# Patient Record
Sex: Female | Born: 1935 | Race: Black or African American | Hispanic: No | Smoking: Former smoker
Health system: Southern US, Community
[De-identification: ages and names within clinical notes are randomized; demographics above are authoritative.]

## PROBLEM LIST (undated history)

## (undated) DIAGNOSIS — K909 Intestinal malabsorption, unspecified: Secondary | ICD-10-CM

## (undated) DIAGNOSIS — N200 Calculus of kidney: Secondary | ICD-10-CM

## (undated) DIAGNOSIS — J189 Pneumonia, unspecified organism: Secondary | ICD-10-CM

## (undated) DIAGNOSIS — K219 Gastro-esophageal reflux disease without esophagitis: Secondary | ICD-10-CM

## (undated) DIAGNOSIS — I2510 Atherosclerotic heart disease of native coronary artery without angina pectoris: Secondary | ICD-10-CM

## (undated) DIAGNOSIS — M1990 Unspecified osteoarthritis, unspecified site: Secondary | ICD-10-CM

## (undated) DIAGNOSIS — E739 Lactose intolerance, unspecified: Secondary | ICD-10-CM

## (undated) DIAGNOSIS — D689 Coagulation defect, unspecified: Secondary | ICD-10-CM

## (undated) DIAGNOSIS — M4800 Spinal stenosis, site unspecified: Secondary | ICD-10-CM

## (undated) DIAGNOSIS — D219 Benign neoplasm of connective and other soft tissue, unspecified: Secondary | ICD-10-CM

## (undated) DIAGNOSIS — D649 Anemia, unspecified: Secondary | ICD-10-CM

## (undated) DIAGNOSIS — D500 Iron deficiency anemia secondary to blood loss (chronic): Secondary | ICD-10-CM

## (undated) DIAGNOSIS — J449 Chronic obstructive pulmonary disease, unspecified: Secondary | ICD-10-CM

## (undated) DIAGNOSIS — I82409 Acute embolism and thrombosis of unspecified deep veins of unspecified lower extremity: Secondary | ICD-10-CM

## (undated) DIAGNOSIS — I10 Essential (primary) hypertension: Secondary | ICD-10-CM

## (undated) DIAGNOSIS — D329 Benign neoplasm of meninges, unspecified: Secondary | ICD-10-CM

## (undated) HISTORY — DX: Acute embolism and thrombosis of unspecified deep veins of unspecified lower extremity: I82.409

## (undated) HISTORY — DX: Benign neoplasm of connective and other soft tissue, unspecified: D21.9

## (undated) HISTORY — DX: Lactose intolerance, unspecified: E73.9

## (undated) HISTORY — DX: Unspecified osteoarthritis, unspecified site: M19.90

## (undated) HISTORY — DX: Atherosclerotic heart disease of native coronary artery without angina pectoris: I25.10

## (undated) HISTORY — DX: Gastro-esophageal reflux disease without esophagitis: K21.9

## (undated) HISTORY — DX: Calculus of kidney: N20.0

## (undated) HISTORY — DX: Intestinal malabsorption, unspecified: K90.9

## (undated) HISTORY — PX: TREATMENT FISTULA ANAL: SUR1390

## (undated) HISTORY — DX: Coagulation defect, unspecified: D68.9

## (undated) HISTORY — DX: Iron deficiency anemia secondary to blood loss (chronic): D50.0

## (undated) HISTORY — DX: Benign neoplasm of meninges, unspecified: D32.9

## (undated) HISTORY — PX: TONSILLECTOMY AND ADENOIDECTOMY: SUR1326

## (undated) HISTORY — PX: INCISION AND DRAINAGE FOOT: SHX1800

## (undated) HISTORY — DX: Anemia, unspecified: D64.9

## (undated) HISTORY — PX: CHOLECYSTECTOMY: SHX55

## (undated) HISTORY — PX: TUBAL LIGATION: SHX77

## (undated) HISTORY — PX: FOOT SURGERY: SHX648

---

## 1999-05-13 ENCOUNTER — Other Ambulatory Visit: Admission: RE | Admit: 1999-05-13 | Discharge: 1999-05-13 | Payer: Self-pay

## 2000-06-29 ENCOUNTER — Other Ambulatory Visit: Admission: RE | Admit: 2000-06-29 | Discharge: 2000-06-29 | Payer: Self-pay | Admitting: Internal Medicine

## 2000-07-03 ENCOUNTER — Encounter: Payer: Self-pay | Admitting: Internal Medicine

## 2000-07-03 ENCOUNTER — Ambulatory Visit (HOSPITAL_COMMUNITY): Admission: RE | Admit: 2000-07-03 | Discharge: 2000-07-03 | Payer: Self-pay | Admitting: Internal Medicine

## 2000-11-27 ENCOUNTER — Other Ambulatory Visit: Admission: RE | Admit: 2000-11-27 | Discharge: 2000-11-27 | Payer: Self-pay | Admitting: General Surgery

## 2010-06-11 DIAGNOSIS — I679 Cerebrovascular disease, unspecified: Secondary | ICD-10-CM | POA: Insufficient documentation

## 2013-10-03 ENCOUNTER — Emergency Department (HOSPITAL_BASED_OUTPATIENT_CLINIC_OR_DEPARTMENT_OTHER): Payer: Medicare Other

## 2013-10-03 ENCOUNTER — Emergency Department (HOSPITAL_BASED_OUTPATIENT_CLINIC_OR_DEPARTMENT_OTHER)
Admission: EM | Admit: 2013-10-03 | Discharge: 2013-10-03 | Disposition: A | Discharge status: Home / Self Care | Payer: Medicare Other | Attending: Emergency Medicine | Admitting: Emergency Medicine

## 2013-10-03 ENCOUNTER — Encounter (HOSPITAL_BASED_OUTPATIENT_CLINIC_OR_DEPARTMENT_OTHER): Payer: Self-pay | Admitting: Emergency Medicine

## 2013-10-03 DIAGNOSIS — Z8701 Personal history of pneumonia (recurrent): Secondary | ICD-10-CM | POA: Insufficient documentation

## 2013-10-03 DIAGNOSIS — IMO0002 Reserved for concepts with insufficient information to code with codable children: Secondary | ICD-10-CM | POA: Insufficient documentation

## 2013-10-03 DIAGNOSIS — I10 Essential (primary) hypertension: Secondary | ICD-10-CM | POA: Insufficient documentation

## 2013-10-03 DIAGNOSIS — R079 Chest pain, unspecified: Secondary | ICD-10-CM | POA: Insufficient documentation

## 2013-10-03 DIAGNOSIS — J441 Chronic obstructive pulmonary disease with (acute) exacerbation: Secondary | ICD-10-CM | POA: Insufficient documentation

## 2013-10-03 DIAGNOSIS — Z79899 Other long term (current) drug therapy: Secondary | ICD-10-CM | POA: Insufficient documentation

## 2013-10-03 HISTORY — DX: Chronic obstructive pulmonary disease, unspecified: J44.9

## 2013-10-03 HISTORY — DX: Essential (primary) hypertension: I10

## 2013-10-03 HISTORY — DX: Pneumonia, unspecified organism: J18.9

## 2013-10-03 LAB — CBC WITH DIFFERENTIAL/PLATELET
Basophils Absolute: 0 10*3/uL (ref 0.0–0.1)
Basophils Relative: 0 % (ref 0–1)
EOS ABS: 0 10*3/uL (ref 0.0–0.7)
EOS PCT: 0 % (ref 0–5)
HCT: 38.5 % (ref 36.0–46.0)
HEMOGLOBIN: 12.2 g/dL (ref 12.0–15.0)
LYMPHS ABS: 2.4 10*3/uL (ref 0.7–4.0)
Lymphocytes Relative: 37 % (ref 12–46)
MCH: 22.9 pg — AB (ref 26.0–34.0)
MCHC: 31.7 g/dL (ref 30.0–36.0)
MCV: 72.2 fL — AB (ref 78.0–100.0)
MONO ABS: 0.5 10*3/uL (ref 0.1–1.0)
MONOS PCT: 8 % (ref 3–12)
NEUTROS PCT: 55 % (ref 43–77)
Neutro Abs: 3.5 10*3/uL (ref 1.7–7.7)
Platelets: 396 10*3/uL (ref 150–400)
RBC: 5.33 MIL/uL — ABNORMAL HIGH (ref 3.87–5.11)
RDW: 17.4 % — ABNORMAL HIGH (ref 11.5–15.5)
WBC: 6.5 10*3/uL (ref 4.0–10.5)

## 2013-10-03 LAB — BASIC METABOLIC PANEL
BUN: 14 mg/dL (ref 6–23)
CO2: 26 mEq/L (ref 19–32)
CREATININE: 0.6 mg/dL (ref 0.50–1.10)
Calcium: 9.8 mg/dL (ref 8.4–10.5)
Chloride: 102 mEq/L (ref 96–112)
GFR calc Af Amer: >90 mL/min (ref >90)
GFR calc non Af Amer: 87 mL/min — ABNORMAL LOW (ref >90)
GLUCOSE: 169 mg/dL — AB (ref 70–99)
POTASSIUM: 3.4 meq/L — AB (ref 3.7–5.3)
Sodium: 143 mEq/L (ref 137–147)

## 2013-10-03 LAB — PRO B NATRIURETIC PEPTIDE: Pro B Natriuretic peptide (BNP): 48.7 pg/mL (ref 0–450)

## 2013-10-03 LAB — TROPONIN I: Troponin I: <0.3 ng/mL (ref <0.30)

## 2013-10-03 MED ORDER — IPRATROPIUM-ALBUTEROL 0.5-2.5 (3) MG/3ML IN SOLN
3.0000 mL | Freq: Once | RESPIRATORY_TRACT | Status: AC
  Administered 2013-10-03: 3 mL via RESPIRATORY_TRACT
  Filled 2013-10-03: qty 3

## 2013-10-03 MED ORDER — PREDNISONE 50 MG PO TABS
60.0000 mg | ORAL_TABLET | Freq: Once | ORAL | Status: AC
  Administered 2013-10-03: 22:00:00 60 mg via ORAL
  Filled 2013-10-03 (×2): qty 1

## 2013-10-03 MED ORDER — PREDNISONE 20 MG PO TABS: ORAL_TABLET | ORAL | Status: DC

## 2013-10-03 NOTE — Discharge Instructions (Signed)

## 2013-10-03 NOTE — ED Notes (Signed)
Sob x 2 days. She was recently discharged from [facility] after being admitted for pneumonia. She is talking in complete sentences. Ambulatory to triage from registration.

## 2013-10-03 NOTE — ED Provider Notes (Signed)
CSN: [id number]     Arrival date & time 10/03/13  1738 History  This chart was scribed for Neta Ehlers, MD by Elby Beck, ED Scribe. This patient was seen in room MH02/MH02 and the patient's care was started at 7:36 PM.   Chief Complaint  Patient presents with  . Shortness of Breath    Patient is a 78 y.o. female presenting with shortness of breath. The history is provided by the patient. No language interpreter was used.  Shortness of Breath Severity:  Moderate Onset quality:  Gradual Duration:  3 days Timing:  Intermittent Progression:  Worsening Context comment:  Recent pneumonia Relieved by:  Inhaler (some relief with at home inhalers) Worsened by:  Activity Ineffective treatments:  None tried Associated symptoms: chest pain   Associated symptoms: no abdominal pain, no cough, no diaphoresis, no fever, no headaches, no neck pain, no sore throat and no vomiting     HPI Comments: Holly Page is a 78 y.o. Female with a history of COPD who presents to the Emergency Department complaining of intermittent SOB over the past 3 days. She states that she was admitted in the hospital on 08/26/13 after being diagnosed with pneumonia, and that she was kept there until 09/03/13. She states that she made a full recovery, but that her SOB returned 2 days ago. She states that finished courses of Zithromax and steroids after being discharged. She states that she had intermittent nausea yesterday and 2 days ago which has subsided. She also states that she has been having Intermittent "aching, burning" chest pain for years, including over the past few days. She states that she has never had a satisfactory diagnosis for this pain, and that she has been told that she is likely just having heartburn. She states that she has been using ProAir, QVAR and albuterol inhalers at home with some relief of her SOB. She also states that she is on supplemental oxygen at home, at nights, when needed. She denies  any known sick contacts. She states that she has not had this season's influenza vaccine. She is a former smoker and states that she quit smoking about 1 year and 3 months ago.   Past Medical History  Diagnosis Date  . Pneumonia   . COPD (chronic obstructive pulmonary disease)   . Hypertension    Past Surgical History  Procedure Laterality Date  . Foot surgery    . Cholecystectomy    . Tonsillectomy     No family history on file. History  Substance Use Topics  . Smoking status: Never Smoker   . Smokeless tobacco: Not on file  . Alcohol Use: No   OB History   Grav Para Term Preterm Abortions TAB SAB Ect Mult Living                 Review of Systems  Constitutional: Negative for fever, chills, diaphoresis, activity change, appetite change and fatigue.  HENT: Negative for congestion, facial swelling, rhinorrhea and sore throat.   Eyes: Negative for photophobia and discharge.  Respiratory: Positive for shortness of breath. Negative for cough and chest tightness.   Cardiovascular: Positive for chest pain. Negative for palpitations and leg swelling.  Gastrointestinal: Positive for nausea. Negative for vomiting, abdominal pain and diarrhea.  Endocrine: Negative for polydipsia and polyuria.  Genitourinary: Negative for dysuria, frequency, difficulty urinating and pelvic pain.  Musculoskeletal: Negative for arthralgias, back pain, neck pain and neck stiffness.  Skin: Negative for color change and wound.  Allergic/Immunologic: Negative for immunocompromised state.  Neurological: Negative for facial asymmetry, weakness, numbness and headaches.  Hematological: Does not bruise/bleed easily.  Psychiatric/Behavioral: Negative for confusion and agitation.    Allergies  Aspirin  Home Medications   Current Outpatient Rx  Name  Route  Sig  Dispense  Refill  . albuterol (PROVENTIL HFA;VENTOLIN HFA) 108 (90 BASE) MCG/ACT inhaler   Inhalation   Inhale into the lungs every 6 (six) hours  as needed for wheezing or shortness of breath.         . AMLODIPINE BESYLATE PO   Oral   Take by mouth.         . Beclomethasone Dipropionate (QVAR IN)   Inhalation   Inhale into the lungs.         . fluticasone (FLONASE) 50 MCG/ACT nasal spray   Each Nare   Place into both nostrils daily.         . Montelukast Sodium (SINGULAIR PO)   Oral   Take by mouth.         . VERAPAMIL HCL ER, CO, PO   Oral   Take by mouth.         . predniSONE (DELTASONE) 20 MG tablet      2 po daily x 4 days   8 tablet   0    Triage Vitals: BP 114/67  Pulse 81  Temp(Src) 98.2 F (36.8 C) (Oral)  Resp 20  Ht 5\' 5"  (1.651 m)  Wt 114 lb (51.71 kg)  BMI 18.97 kg/m2  SpO2 96%  Physical Exam  Nursing note and vitals reviewed. Constitutional: She is oriented to person, place, and time. She appears well-developed and well-nourished. No distress.  HENT:  Head: Normocephalic.  Mouth/Throat: Oropharynx is clear and moist.  Eyes: Pupils are equal, round, and reactive to light.  Neck: Neck supple.  Cardiovascular: Normal rate, regular rhythm and normal heart sounds.   Pulmonary/Chest: Effort normal and breath sounds normal. No respiratory distress. She has no wheezes.  Abdominal: Soft. She exhibits no distension. There is no tenderness. There is no rebound and no guarding.  Musculoskeletal: She exhibits no edema and no tenderness.  Neurological: She is alert and oriented to person, place, and time.  Skin: Skin is warm and dry.  Psychiatric: She has a normal mood and affect.    ED Course  Procedures (including critical care time)  DIAGNOSTIC STUDIES: Oxygen Saturation is 96% on RA, normal by my interpretation.    COORDINATION OF CARE: 7:46 PM- CXR and EKG have been obtained. Pt advised of plan for treatment and pt agrees.  Medications  predniSONE (DELTASONE) tablet 60 mg (not administered)  ipratropium-albuterol (DUONEB) 0.5-2.5 (3) MG/3ML nebulizer solution 3 mL (3 mLs  Nebulization Given 10/03/13 2009)   Labs Review Labs Reviewed  CBC WITH DIFFERENTIAL - Abnormal; Notable for the following:    RBC 5.33 (*)    MCV 72.2 (*)    MCH 22.9 (*)    RDW 17.4 (*)    All other components within normal limits  BASIC METABOLIC PANEL - Abnormal; Notable for the following:    Potassium 3.4 (*)    Glucose, Bld 169 (*)    GFR calc non Af Amer 87 (*)    All other components within normal limits  PRO B NATRIURETIC PEPTIDE  TROPONIN I   Imaging Review Dg Chest 2 View  10/03/2013   CLINICAL DATA:  Shortness of breath for 2 days, upper chest pressure, region pneumonia, history hypertension,  COPD  EXAM: CHEST  2 VIEW  COMPARISON:  09/26/2013  FINDINGS: Normal heart size, mediastinal contours, and pulmonary vascularity.  Atherosclerotic calcification aortic arch.  Severe emphysematous changes consistent with COPD.  No definite pulmonary infiltrate, pleural effusion or pneumothorax.  Mild thoracolumbar scoliosis.  IMPRESSION: Severe COPD changes.  No acute abnormalities.   Electronically Signed   By: Lavonia Dana M.D.   On: 10/03/2013 18:31    EKG Interpretation    Date/Time:  Monday October 03 2013 18:02:10 EST Ventricular Rate:  66 PR Interval:  168 QRS Duration: 70 QT Interval:  390 QTC Calculation: 408 R Axis:   84 Text Interpretation:  Normal sinus rhythm EKG WITHIN NORMAL LIMITS No prior for comparison.  Confirmed by DOCHERTY  MD, [place] [phone]) on 10/03/2013 8:02:01 PM            MDM   1. COPD exacerbation    Pt is a 78 y.o. female with Pmhx as above who presents with about 2 days of inc dyspnea, worse w/ exertion. No cough, fever, chills, n/v, d/a, ab pain, leg pain/swelling. Additionally, she states she has been having intermittent L sided/central CP for about 2 years, intermittent, w/o assoc symptoms.  She has had an episode a day for last 3 days which are similar to prior.  She reports no cause has been found despite w/u. On PE, VSS, pt in NAD. She has dec  breath sounds throughout w/ inc expiratory time.     CP is atypical, doubt ACS given trop, EKG, nml BNP & Trop. CXR shows no opacity or CHF.  Suspect symptoms due to COPD exacerbation.  Pt feeling improved after duoneb x1.  I feel she is safe for PCP f/u tomorrow at 2:30.  Will start on 5d steroid burst.  Return precautions given for new or worsening symptoms including worsening SOB, CP, fever, leg pain/swelling.       I personally performed the services described in this documentation, which was scribed in my presence. The recorded information has been reviewed and is accurate.  Neta Ehlers, MD 10/03/13 2121

## 2013-10-11 ENCOUNTER — Encounter: Payer: Self-pay | Admitting: Critical Care Medicine

## 2013-10-11 ENCOUNTER — Ambulatory Visit (INDEPENDENT_AMBULATORY_CARE_PROVIDER_SITE_OTHER): Payer: Medicare Other | Admitting: Critical Care Medicine

## 2013-10-11 VITALS — BP 110/68 | HR 82 | Temp 98.2°F | Ht 65.0 in | Wt 112.5 lb

## 2013-10-11 DIAGNOSIS — J449 Chronic obstructive pulmonary disease, unspecified: Secondary | ICD-10-CM

## 2013-10-11 DIAGNOSIS — E739 Lactose intolerance, unspecified: Secondary | ICD-10-CM | POA: Insufficient documentation

## 2013-10-11 DIAGNOSIS — K219 Gastro-esophageal reflux disease without esophagitis: Secondary | ICD-10-CM | POA: Insufficient documentation

## 2013-10-11 DIAGNOSIS — J439 Emphysema, unspecified: Secondary | ICD-10-CM | POA: Insufficient documentation

## 2013-10-11 DIAGNOSIS — J438 Other emphysema: Secondary | ICD-10-CM

## 2013-10-11 MED ORDER — ACLIDINIUM BROMIDE 400 MCG/ACT IN AEPB: 1.0000 | INHALATION_SPRAY | Freq: Two times a day (BID) | RESPIRATORY_TRACT | Status: DC

## 2013-10-11 NOTE — Patient Instructions (Signed)
Stop Qvar Stop accolate Start Tudorza one puff twice daily Use proair as needed  A referral to pulmonary rehab will be made Return 2 months

## 2013-10-11 NOTE — Progress Notes (Signed)
Subjective:    Patient ID: Holly Page, female    DOB: [birth year], 78 y.o.   MRN: [id number]  HPI Comments: Dx Copd since 1990s.  Pt was stable, then in early 2000s more dyspnea noted. Now over past two years is worse.  Oxygen ok but still breathless.  No real cough or mucus production. Mucus usually clear  Shortness of Breath This is a chronic problem. The current episode started more than 1 year ago. The problem occurs daily (pt is dyspneic at rest and exertion. ). The problem has been gradually worsening. Associated symptoms include orthopnea, rhinorrhea and wheezing. Pertinent negatives include no abdominal pain, chest pain, claudication, coryza, ear pain, fever, headaches, hemoptysis, leg pain, leg swelling, neck pain, PND, rash, sore throat, sputum production, swollen glands, syncope or vomiting. The symptoms are aggravated by fumes, odors, weather changes and any activity. Risk factors include smoking. She has tried steroid inhalers and beta agonist inhalers (on qvar and proair. proair helps, ? if qvar helps) for the symptoms. The treatment provided mild relief. Her past medical history is significant for asthma, COPD, DVT and pneumonia. There is no history of allergies, aspirin allergies, bronchiolitis, CAD, chronic lung disease, a heart failure, PE or a recent surgery. (DVT in leg in 1980s.  bruises easily)   Past Medical History  Diagnosis Date  . Pneumonia   . COPD (chronic obstructive pulmonary disease)   . Hypertension   . DVT (deep venous thrombosis) 1980s    unsure which leg  . GERD (gastroesophageal reflux disease)   . Lactose intolerance      Family History  Problem Relation Age of Onset  . Lupus Sister   . COPD Father   . Heart attack Son      History   Social History  . Marital Status: Divorced    Spouse Name: N/A    Number of Children: N/A  . Years of Education: N/A   Occupational History  . Retired    Social History Main Topics  . Smoking status:  Former Smoker -- 1.50 packs/day for 56 years    Types: Cigarettes    Quit date: 04/29/2012  . Smokeless tobacco: Never Used  . Alcohol Use: No     Comment: quit drinking beer 2005  . Drug Use: No  . Sexual Activity: Not on file   Other Topics Concern  . Not on file   Social History Narrative  . No narrative on file     Allergies  Allergen Reactions  . Aspirin     Heart flutter  . Hydrocodone     hallucinations     Outpatient Prescriptions Prior to Visit  Medication Sig Dispense Refill  . albuterol (PROVENTIL HFA;VENTOLIN HFA) 108 (90 BASE) MCG/ACT inhaler Inhale into the lungs every 6 (six) hours as needed for wheezing or shortness of breath.      . fluticasone (FLONASE) 50 MCG/ACT nasal spray Place 2 sprays into both nostrils daily.       [name] AMLODIPINE BESYLATE PO Take by mouth.      . Beclomethasone Dipropionate (QVAR IN) Inhale into the lungs.      . Montelukast Sodium (SINGULAIR PO) Take by mouth.      . predniSONE (DELTASONE) 20 MG tablet 2 po daily x 4 days  8 tablet  0  . VERAPAMIL HCL ER, CO, PO Take by mouth.       No facility-administered medications prior to visit.       Review of  Systems  Constitutional: Positive for fatigue. Negative for fever, chills, diaphoresis, activity change, appetite change and unexpected weight change.  HENT: Positive for postnasal drip, rhinorrhea, trouble swallowing and voice change. Negative for congestion, dental problem, ear discharge, ear pain, facial swelling, hearing loss, mouth sores, nosebleeds, sinus pressure, sneezing, sore throat and tinnitus.   Eyes: Negative for photophobia, discharge, itching and visual disturbance.  Respiratory: Positive for cough, chest tightness, shortness of breath and wheezing. Negative for apnea, hemoptysis, sputum production, choking and stridor.   Cardiovascular: Positive for orthopnea. Negative for chest pain, palpitations, claudication, leg swelling, syncope and PND.  Gastrointestinal:  Negative for nausea, vomiting, abdominal pain, constipation, blood in stool and abdominal distention.       Notes heartburn but ok on meds  Genitourinary: Positive for frequency. Negative for dysuria, urgency, hematuria, flank pain, decreased urine volume and difficulty urinating.  Musculoskeletal: Positive for back pain. Negative for arthralgias, gait problem, joint swelling, myalgias, neck pain and neck stiffness.  Skin: Negative for color change, pallor and rash.  Neurological: Negative for dizziness, tremors, seizures, syncope, speech difficulty, weakness, light-headedness, numbness and headaches.  Hematological: Negative for adenopathy. Bruises/bleeds easily.  Psychiatric/Behavioral: Negative for confusion, sleep disturbance and agitation. The patient is nervous/anxious.        Objective:   Physical Exam Filed Vitals:   10/11/13 1017  BP: 110/68  Pulse: 82  Temp: 98.2 F (36.8 C)  TempSrc: Oral  Height: 5\' 5"  (1.651 m)  Weight: 112 lb 8 oz (51.03 kg)  SpO2: 98%    Gen: Pleasant, thin female in no distress,  normal affect  ENT: No lesions,  mouth clear,  oropharynx clear, no postnasal drip  Neck: No JVD, no TMG, no carotid bruits  Lungs: No use of accessory muscles, no dullness to percussion, distant breath sounds  Cardiovascular: RRR, heart sounds normal, no murmur or gallops, no peripheral edema  Abdomen: soft and NT, no HSM,  BS normal  Musculoskeletal: No deformities, no cyanosis or clubbing  Neuro: alert, non focal  Skin: Warm, no lesions or rashes  No results found.  Note exertional oxygenation normal on this visit 10/11/2013  Chest x-rays from prior visits reviewed and shows emphysematous change without acute infiltrate  Overnight oximetry may 2014: Intermittent hypoxemia with saturations in the mid-80% range proximally 37% of the evening  note based on this home oxygen was not ordered in May 2014  Spirometry 05/18/2012: FEV1 28% predicted FVC 76%  predicted FEV1 to FVC ratio 27% predict  Pulmonary functions 01/23/2009: FEV1 54% predicted FVC 113% predicted FEV1 to FVC ratio 38% predicted diffusion capacity 51% predicted Spirometry 10/11/2013: FEV1 30% predicted FVC 60% predicted FEV1 to FVC ratio 50 % predicted FEF 25-7538% predicted    Assessment & Plan:   COPD with emphysema Gold C Gold stage C. COPD with primary emphysematous component Ongoing air trapping seen on exam The patient was not on adequate bronchodilation Plan Stop Qvar Stop accolate Start Tudorza one puff twice daily the patient was instructed as to proper use Use proair as needed  note this patient artery has an established appointment at [facility] regional pulmonary rehabilitation and she will pursue this rehabilitation program Return 2 months    Updated Medication List Outpatient Encounter Prescriptions as of 10/11/2013  Medication Sig  . albuterol (PROVENTIL HFA;VENTOLIN HFA) 108 (90 BASE) MCG/ACT inhaler Inhale into the lungs every 6 (six) hours as needed for wheezing or shortness of breath.  [name] albuterol (PROVENTIL) (2.5 MG/3ML) 0.083% nebulizer solution Take  2.5 mg by nebulization every 6 (six) hours as needed for wheezing or shortness of breath.  [name] amLODipine (NORVASC) 10 MG tablet Take 10 mg by mouth daily.  . fluticasone (FLONASE) 50 MCG/ACT nasal spray Place 2 sprays into both nostrils daily.   [name] guaiFENesin (MUCINEX CHEST CONGESTION CHILD) 100 MG/5ML liquid Take 200 mg by mouth as needed for cough.  . Loperamide HCl ([facility] ANTI-DIARRHEA PO) Take by mouth as needed.  . pantoprazole (PROTONIX) 40 MG tablet Take 1 tablet by mouth daily.  . predniSONE (DELTASONE) 10 MG tablet Take 10 mg by mouth. Taper as directed  . verapamil (VERELAN PM) 240 MG 24 hr capsule Take 240 mg by mouth daily.  . [DISCONTINUED] beclomethasone (QVAR) 80 MCG/ACT inhaler Inhale 2 puffs into the lungs 2 (two) times daily.  . [DISCONTINUED] zafirlukast (ACCOLATE) 20 MG tablet Take 20  mg by mouth 2 (two) times daily before a meal.  . Aclidinium Bromide (TUDORZA PRESSAIR) 400 MCG/ACT AEPB Inhale 1 puff into the lungs 2 (two) times daily.  . [DISCONTINUED] Aclidinium Bromide (TUDORZA PRESSAIR) 400 MCG/ACT AEPB Inhale 1 puff into the lungs 2 (two) times daily.  . [DISCONTINUED] AMLODIPINE BESYLATE PO Take by mouth.  . [DISCONTINUED] Beclomethasone Dipropionate (QVAR IN) Inhale into the lungs.  . [DISCONTINUED] Montelukast Sodium (SINGULAIR PO) Take by mouth.  . [DISCONTINUED] predniSONE (DELTASONE) 20 MG tablet 2 po daily x 4 days  . [DISCONTINUED] VERAPAMIL HCL ER, CO, PO Take by mouth.

## 2013-10-11 NOTE — Assessment & Plan Note (Signed)
Gold stage C. COPD with primary emphysematous component Ongoing air trapping seen on exam The patient was not on adequate bronchodilation Plan Stop Qvar Stop accolate Start Tudorza one puff twice daily the patient was instructed as to proper use Use proair as needed  note this patient artery has an established appointment at [facility] regional pulmonary rehabilitation and she will pursue this rehabilitation program Return 2 months

## 2013-10-14 ENCOUNTER — Encounter: Payer: Self-pay | Admitting: Critical Care Medicine

## 2013-10-14 DIAGNOSIS — J4550 Severe persistent asthma, uncomplicated: Secondary | ICD-10-CM | POA: Insufficient documentation

## 2013-10-24 ENCOUNTER — Telehealth: Payer: Self-pay | Admitting: Critical Care Medicine

## 2013-10-24 NOTE — Telephone Encounter (Signed)
Spoke with the pt  She states needing PA for tudorza She has 1 sample left  Called and spoke [facility] at [facility]  Was given number to call for PA [phone]  ID number [id number]   Pharmacist advised that spiriva is preferred  Dr Joya Gaskins do you want to proceed with PA or have her try spiriva? Please advise thanks

## 2013-10-24 NOTE — Telephone Encounter (Signed)
Do PA, pt has failred spiriva

## 2013-10-24 NOTE — Telephone Encounter (Signed)
Called to initiate PA and had over 10 min long hold time  Try later

## 2013-10-25 NOTE — Telephone Encounter (Signed)
Attempted to call to initate PA, was hold for 15 minutes. Form has been completed and will be faxed to plan. Will route message to Crystal to follow up on.

## 2013-10-26 NOTE — Telephone Encounter (Signed)
Returning call can be reached at [phone].Elnita Maxwell

## 2013-10-26 NOTE — Telephone Encounter (Signed)
Called spoke with patient, advised of Tudorza approval thru 12.31.15. Pt verbalized her understanding and denied any questions/concerns at this time. Nothing further needed; will sign off.

## 2013-10-26 NOTE — Telephone Encounter (Signed)
Received APPROVAL from [place] through 09/28/2014. Rosanna with [facility] aware of approval. [email or web address] to inform pt. I have placed approval letter in scan folder.

## 2013-11-04 ENCOUNTER — Telehealth: Payer: Self-pay | Admitting: Critical Care Medicine

## 2013-11-04 MED ORDER — PREDNISONE 10 MG PO TABS: ORAL_TABLET | ORAL | Status: DC

## 2013-11-04 NOTE — Telephone Encounter (Signed)
Agree with the rec of anoro, give plenty of samples

## 2013-11-04 NOTE — Telephone Encounter (Signed)
Spoke with pt. She has been using the [place] x january. She reports she thought it was helping but then this week her breathing has been heavy and is having to use her rescue during the day now. She is not able to make it to [place] for appt today since she lives in [place]. Will forward to DOD for recs. Please advise MW thanks  Allergies  Allergen Reactions  . Aspirin     Heart flutter  . Hydrocodone     hallucinations

## 2013-11-04 NOTE — Telephone Encounter (Signed)
Best option is anoro trial but it's quite expensive and prefer she get a sample x 2 weeks  In meantime ok to try Prednisone 10 mg take  4 each am x 2 days,   2 each am x 2 days,  1 each am x 2 days and stop

## 2013-11-04 NOTE — Telephone Encounter (Signed)
Called and spoke with pt. She is aware of recs. She will go to [place] on Tuesday to get sample as she is not able to make it to [place]. Since Crystal is out there will forward message to her as FYI and as well to Dr. Joya Gaskins

## 2013-11-08 ENCOUNTER — Telehealth: Payer: Self-pay | Admitting: Critical Care Medicine

## 2013-11-08 MED ORDER — UMECLIDINIUM-VILANTEROL 62.5-25 MCG/INH IN AEPB: 1.0000 | INHALATION_SPRAY | Freq: Every day | RESPIRATORY_TRACT | Status: DC

## 2013-11-08 NOTE — Telephone Encounter (Signed)
I called and spoke with pt. She is requesting to pick up sample of anoro in [place] today. Pt reports she can be there in about 10-15 min. Called spoke with crystal. She is aware pt on her way.

## 2013-11-08 NOTE — Telephone Encounter (Signed)
Pt came in [place] office today.  I provided pt with 3 wks of anoro samples and instructed her on how to use this.  She is to call back if she has any problems with the medication and will call back with an update and a pharmacy to send rx to.

## 2013-11-18 ENCOUNTER — Telehealth: Payer: Self-pay | Admitting: Critical Care Medicine

## 2013-11-18 NOTE — Telephone Encounter (Signed)
Pt states that Anoro is working well for her. She is going to continue to use.

## 2013-11-21 ENCOUNTER — Telehealth: Payer: Self-pay | Admitting: Critical Care Medicine

## 2013-11-21 DIAGNOSIS — J309 Allergic rhinitis, unspecified: Secondary | ICD-10-CM | POA: Insufficient documentation

## 2013-11-21 DIAGNOSIS — J449 Chronic obstructive pulmonary disease, unspecified: Secondary | ICD-10-CM | POA: Insufficient documentation

## 2013-11-21 DIAGNOSIS — R0902 Hypoxemia: Secondary | ICD-10-CM | POA: Insufficient documentation

## 2013-11-21 NOTE — Telephone Encounter (Signed)
lmonmtcb x1

## 2013-11-21 NOTE — Telephone Encounter (Signed)
Spoke with pt.  She was scheduled to see PW tomorrow in [place] for SOB.  He will not be in office tomorrow. Offered OV today at [facility].  Pt cannot come in today.  We have scheduled her to see TP tomorrow, Feb 24 at 11:15 am at [facility].  Pt aware and is to call back if anything further is needed prior to this.

## 2013-11-22 ENCOUNTER — Ambulatory Visit: Payer: Medicare Other | Admitting: Adult Health

## 2013-11-22 ENCOUNTER — Ambulatory Visit: Payer: Medicare Other | Admitting: Critical Care Medicine

## 2013-11-23 ENCOUNTER — Telehealth: Payer: Self-pay | Admitting: Emergency Medicine

## 2013-11-23 ENCOUNTER — Encounter: Payer: Self-pay | Admitting: Emergency Medicine

## 2013-11-23 ENCOUNTER — Ambulatory Visit (INDEPENDENT_AMBULATORY_CARE_PROVIDER_SITE_OTHER): Payer: Medicare Other | Admitting: Emergency Medicine

## 2013-11-23 VITALS — BP 102/58 | HR 78 | Ht 65.0 in | Wt 114.4 lb

## 2013-11-23 DIAGNOSIS — J439 Emphysema, unspecified: Secondary | ICD-10-CM

## 2013-11-23 DIAGNOSIS — J438 Other emphysema: Secondary | ICD-10-CM

## 2013-11-23 MED ORDER — BUDESONIDE 0.25 MG/2ML IN SUSP: 0.2500 mg | Freq: Two times a day (BID) | RESPIRATORY_TRACT | Status: DC

## 2013-11-23 MED ORDER — ARFORMOTEROL TARTRATE 15 MCG/2ML IN NEBU: 15.0000 ug | INHALATION_SOLUTION | Freq: Two times a day (BID) | RESPIRATORY_TRACT | Status: DC

## 2013-11-23 MED ORDER — IPRATROPIUM BROMIDE 0.02 % IN SOLN: 0.5000 mg | Freq: Four times a day (QID) | RESPIRATORY_TRACT | Status: DC

## 2013-11-23 NOTE — Progress Notes (Signed)
Subjective:    Patient ID: Holly Page, female    DOB: [birth year], 78 y.o.   MRN: [id number]  HPI Comments: Dx Copd since 1990s.  Pt was stable, then in early 2000s more dyspnea noted. Now over past two years is worse.  Oxygen ok but still breathless.  No real cough or mucus production. Mucus usually clear  Shortness of Breath This is a chronic problem. The current episode started more than 1 year ago. The problem occurs daily (pt is dyspneic at rest and exertion. ). The problem has been gradually worsening. Associated symptoms include orthopnea, rhinorrhea and wheezing. Pertinent negatives include no abdominal pain, chest pain, claudication, coryza, ear pain, fever, headaches, hemoptysis, leg pain, leg swelling, neck pain, PND, rash, sore throat, sputum production, swollen glands, syncope or vomiting. The symptoms are aggravated by fumes, odors, weather changes and any activity. Risk factors include smoking. She has tried steroid inhalers and beta agonist inhalers (on qvar and proair. proair helps, ? if qvar helps) for the symptoms. The treatment provided mild relief. Her past medical history is significant for asthma, COPD, DVT and pneumonia. There is no history of allergies, aspirin allergies, bronchiolitis, CAD, chronic lung disease, a heart failure, PE or a recent surgery. (DVT in leg in 1980s.  bruises easily)   Acute Office Visit 11/23/13 -- followed by Dr Delford Field for emphysema / COPD (he has seen her once). She was started on tudorza, may have initially improved but then she believed that it was actually making things worse. She was given Anoro as a replacement, but she believes that the Anoro has bothered her also > more SOB, more SABA use, more wheeze and cough. She was started on Pred 2/24 by her PCP and feels some improvement. She didn't take the Anoro today.    Past Medical History  Diagnosis Date  . Pneumonia   . COPD (chronic obstructive pulmonary disease)   . Hypertension   .  DVT (deep venous thrombosis) 1980s    unsure which leg  . GERD (gastroesophageal reflux disease)   . Lactose intolerance      Family History  Problem Relation Age of Onset  . Lupus Sister   . COPD Father   . Heart attack Son      History   Social History  . Marital Status: Divorced    Spouse Name: N/A    Number of Children: N/A  . Years of Education: N/A   Occupational History  . Retired    Social History Main Topics  . Smoking status: Former Smoker -- 1.50 packs/day for 56 years    Types: Cigarettes    Quit date: 04/29/2012  . Smokeless tobacco: Never Used  . Alcohol Use: No     Comment: quit drinking beer 2005  . Drug Use: No  . Sexual Activity: Not on file   Other Topics Concern  . Not on file   Social History Narrative  . No narrative on file     Allergies  Allergen Reactions  . Aspirin     Heart flutter  . Hydrocodone     hallucinations     Outpatient Prescriptions Prior to Visit  Medication Sig Dispense Refill  . albuterol (PROVENTIL HFA;VENTOLIN HFA) 108 (90 BASE) MCG/ACT inhaler Inhale into the lungs every 6 (six) hours as needed for wheezing or shortness of breath.      [name] albuterol (PROVENTIL) (2.5 MG/3ML) 0.083% nebulizer solution Take 2.5 mg by nebulization every 6 (six) hours as needed  for wheezing or shortness of breath.      [name] amLODipine (NORVASC) 10 MG tablet Take 10 mg by mouth daily.      . fluticasone (FLONASE) 50 MCG/ACT nasal spray Place 2 sprays into both nostrils daily.       . Loperamide HCl ([facility] ANTI-DIARRHEA PO) Take by mouth as needed.      . pantoprazole (PROTONIX) 40 MG tablet Take 1 tablet by mouth daily.      . predniSONE (DELTASONE) 10 MG tablet Take 10 mg by mouth. Taper as directed      . verapamil (VERELAN PM) 240 MG 24 hr capsule Take 240 mg by mouth daily.      [name] guaiFENesin (MUCINEX CHEST CONGESTION CHILD) 100 MG/5ML liquid Take 200 mg by mouth as needed for cough.      . predniSONE (DELTASONE) 10 MG tablet Take 4 tabs  daily x 2 days, 2 tabs daily x 2 days, 1 tab daily x 2 days  14 tablet  0  . Umeclidinium-Vilanterol (ANORO ELLIPTA) 62.5-25 MCG/INH AEPB Inhale 1 puff into the lungs daily.  3 each  0   No facility-administered medications prior to visit.    Review of Systems  Constitutional: Positive for fatigue. Negative for fever, chills, diaphoresis, activity change, appetite change and unexpected weight change.  HENT: Positive for postnasal drip, rhinorrhea, trouble swallowing and voice change. Negative for congestion, dental problem, ear discharge, ear pain, facial swelling, hearing loss, mouth sores, nosebleeds, sinus pressure, sneezing, sore throat and tinnitus.   Eyes: Negative for photophobia, discharge, itching and visual disturbance.  Respiratory: Positive for cough, chest tightness, shortness of breath and wheezing. Negative for apnea, hemoptysis, sputum production, choking and stridor.   Cardiovascular: Positive for orthopnea. Negative for chest pain, palpitations, claudication, leg swelling, syncope and PND.  Gastrointestinal: Negative for nausea, vomiting, abdominal pain, constipation, blood in stool and abdominal distention.       Notes heartburn but ok on meds  Genitourinary: Positive for frequency. Negative for dysuria, urgency, hematuria, flank pain, decreased urine volume and difficulty urinating.  Musculoskeletal: Positive for back pain. Negative for arthralgias, gait problem, joint swelling, myalgias, neck pain and neck stiffness.  Skin: Negative for color change, pallor and rash.  Neurological: Negative for dizziness, tremors, seizures, syncope, speech difficulty, weakness, light-headedness, numbness and headaches.  Hematological: Negative for adenopathy. Bruises/bleeds easily.  Psychiatric/Behavioral: Negative for confusion, sleep disturbance and agitation. The patient is nervous/anxious.        Objective:   Physical Exam Filed Vitals:   11/23/13 1123  BP: 102/58  Pulse: 78   Height: 5\' 5"  (1.651 m)  Weight: 114 lb 6.4 oz (51.891 kg)  SpO2: 98%    Gen: Pleasant, thin female in no distress,  normal affect  ENT: No lesions,  mouth clear,  oropharynx clear, no postnasal drip, UA noise on a forced exp  Neck: No JVD, no TMG, no carotid bruits  Lungs: No use of accessory muscles, distant breath sounds, she has some wheeze on a forced exp but also UA noise.   Cardiovascular: RRR, heart sounds normal, no murmur or gallops, no peripheral edema  Musculoskeletal: No deformities, no cyanosis or clubbing  Neuro: alert, non focal  Skin: Warm, no lesions or rashes  Studies:  Note exertional oxygenation normal 10/11/2013  Chest x-rays from prior visits reviewed and shows emphysematous change without acute infiltrate  Overnight oximetry may 2014: Intermittent hypoxemia with saturations in the mid-80% range proximally 37% of the evening  note based  on this home oxygen was not ordered in May 2014  Spirometry 05/18/2012: FEV1 28% predicted FVC 76% predicted FEV1 to FVC ratio 27% predict  Pulmonary functions 01/23/2009: FEV1 54% predicted FVC 113% predicted FEV1 to FVC ratio 38% predicted diffusion capacity 51% predicted Spirometry 10/11/2013: FEV1 30% predicted FVC 60% predicted FEV1 to FVC ratio 50 % predicted FEF 25-7538% predicted      Assessment & Plan:   COPD with emphysema Gold C She states that both the Anoro and [place] made her breathing worse. ? A techniques problem, ? UA irritation. She does have some UA noise on exam (on pred today).  - will try changing to nebs delivery system, see if this works better for her - follow progress w Dr Joya Gaskins.       Updated Medication List Outpatient Encounter Prescriptions as of 11/23/2013  Medication Sig  . albuterol (PROVENTIL HFA;VENTOLIN HFA) 108 (90 BASE) MCG/ACT inhaler Inhale into the lungs every 6 (six) hours as needed for wheezing or shortness of breath.  [name] albuterol (PROVENTIL) (2.5 MG/3ML) 0.083%  nebulizer solution Take 2.5 mg by nebulization every 6 (six) hours as needed for wheezing or shortness of breath.  [name] amLODipine (NORVASC) 10 MG tablet Take 10 mg by mouth daily.  [name] dextromethorphan-guaiFENesin (MUCINEX DM) 30-600 MG per 12 hr tablet Take 1 tablet by mouth daily.  . fluticasone (FLONASE) 50 MCG/ACT nasal spray Place 2 sprays into both nostrils daily.   . Loperamide HCl ([facility]S ANTI-DIARRHEA PO) Take by mouth as needed.  . pantoprazole (PROTONIX) 40 MG tablet Take 1 tablet by mouth daily.  . predniSONE (DELTASONE) 10 MG tablet Take 10 mg by mouth. Taper as directed  . verapamil (VERELAN PM) 240 MG 24 hr capsule Take 240 mg by mouth daily.  . [DISCONTINUED] guaiFENesin (MUCINEX CHEST CONGESTION CHILD) 100 MG/5ML liquid Take 200 mg by mouth as needed for cough.  . [DISCONTINUED] predniSONE (DELTASONE) 10 MG tablet Take 4 tabs daily x 2 days, 2 tabs daily x 2 days, 1 tab daily x 2 days  . arformoterol (BROVANA) 15 MCG/2ML NEBU Take 2 mLs (15 mcg total) by nebulization 2 (two) times daily.  . budesonide (PULMICORT) 0.25 MG/2ML nebulizer solution Take 2 mLs (0.25 mg total) by nebulization 2 (two) times daily.  [name] ipratropium (ATROVENT) 0.02 % nebulizer solution Take 2.5 mLs (0.5 mg total) by nebulization 4 (four) times daily.  . [DISCONTINUED] Umeclidinium-Vilanterol (ANORO ELLIPTA) 62.5-25 MCG/INH AEPB Inhale 1 puff into the lungs daily.

## 2013-11-23 NOTE — Assessment & Plan Note (Addendum)
She states that both the Anoro and [place] made her breathing worse. ? A techniques problem, ? UA irritation. She does have some UA noise on exam (on pred today).  - will try changing to nebs delivery system, see if this works better for her - follow progress w Dr Joya Gaskins.   Please stop the Anoro.  Finish the prednisone that was ordered 2/24.  We will change you nebulized medication:  - start using brovana and pulmicort nebulizers twice a day on a schedule (every day). Rinse your mouth and gargle after taking this.  - start taking ipratropium nebulizers 4 times a day on a schedule (every day) - you may still use your albuterol nebulizer up to every 4 hours in between your scheduled medications if needed for breathing problems.  Follow with Dr Joya Gaskins to discuss whether this plan has helped you

## 2013-11-23 NOTE — Telephone Encounter (Signed)
Please stop the Anoro.   Finish the prednisone that was ordered 2/24.   We will change you nebulized medication:   - start using brovana and pulmicort nebulizers twice a day on a schedule (every day). Rinse your mouth and gargle after taking this.   - start taking ipratropium nebulizers 4 times a day on a schedule (every day) - you may still use your albuterol nebulizer up to every 4 hours in between your scheduled medications if needed for breathing problems.   Follow with Dr Joya Gaskins to discuss whether this plan has helped you ---  Pt was getting these through [facility] and no longer carry these medications. Too expensive through local pharm. I advised pt we will set her up with new DME to get her neb medications through. RX printed off so we can give to PCC's to fax once done. Order placed as well. Nothing further needed

## 2013-11-23 NOTE — Patient Instructions (Signed)
Please stop the Anoro.  Finish the prednisone that was ordered 2/24.  We will change you nebulized medication:  - start using brovana and pulmicort nebulizers twice a day on a schedule (every day). Rinse your mouth and gargle after taking this.  - start taking ipratropium nebulizers 4 times a day on a schedule (every day) - you may still use your albuterol nebulizer up to every 4 hours in between your scheduled medications if needed for breathing problems.  Follow with Dr Joya Gaskins to discuss whether this plan has helped you

## 2013-11-23 NOTE — Telephone Encounter (Signed)
Pt  Said she would not be getting brovana, it's to exspensive even with insurance.Holly Page

## 2013-11-25 ENCOUNTER — Telehealth: Payer: Self-pay | Admitting: Critical Care Medicine

## 2013-11-25 DIAGNOSIS — J439 Emphysema, unspecified: Secondary | ICD-10-CM

## 2013-11-25 NOTE — Telephone Encounter (Signed)
Only thing cheaper than brovana/budesonide is albuterol 2.5mg  in neb med QID and prn Ok for new nebulizr

## 2013-11-25 NOTE — Telephone Encounter (Signed)
Pt is asking for an update & can be reached at [phone].  Pt is upset that she hasn't gotten her new nebulizer yet.  Satira Anis

## 2013-11-25 NOTE — Telephone Encounter (Signed)
lmomtcb x 2  

## 2013-11-25 NOTE — Telephone Encounter (Signed)
Pt returning mind's call.Holly Page

## 2013-11-25 NOTE — Telephone Encounter (Signed)
I have sent order to [facility] for a new neb machine  PW, please advise on alternative to budesonide and brovana, thanks!

## 2013-11-25 NOTE — Telephone Encounter (Signed)
Message closed in error copied below:  Holly Page at 11/25/2013 2:54 PM    Status: Signed       Pt is asking for an update & can be reached at [phone]. Pt is upset that she hasn't gotten her new nebulizer yet. Holly D [place], [place] at 11/25/2013 1:24 PM     Status: Signed        I have sent order to [facility] for a new neb machine  PW, please advise on alternative to budesonide and brovana, thanks!        [name] at 11/25/2013 10:59 AM     Status: Signed        Pt is also requesting a new order for a nebulizer. Pt has one, but it is 76 yrs old and has been told that she is eligible for a new one. Please send Nebulizer order to [facility]. [name]                  Encounter MyChart Messages     No messages in this encounter             Routing History     Priority Sent On From To Message Type     11/25/2013 1:26 PM Rosana Berger, CMA Elsie Stain, MD Patient Calls     11/25/2013 10:57 AM Holly Page [facility] Triage Pool Patient Calls           Created by     Holly Page on 11/25/2013 10:55 AM                      Orders Placed This Encounter     AMB REFERRAL FOR DME [[id number] Custom]                   Visit Pharmacy     [id number] [place], [place] [address] AT [address]             Contacts       Type Contact Phone    11/25/2013 10:55 AM Phone (Incoming) [place], [place] M (Self) [phone] (H)    Per Suanne Marker: Pt is unable to afford brovana & pulmicort. Request to get other meds that may be more affordable/cheaper. DME is [facility]. Pt refused pt assistance from [place] at this time.

## 2013-11-25 NOTE — Telephone Encounter (Signed)
I called and spoke with pt. She reports she is not eligible for new neb machine. She received hers in 2011. She reports she has an "ultrasonic neb machine" and on the neb medications it reports she needs to use a "jet neb machine". I advised will call [facility]. I spoke with Rodena Piety from [place]. She reports the machine pt has is fine to use with the nebulizers prescribed for pt. They will also call and explain this to pt.  I called pt and made her aware and that [facility] will call her as well. Nothing further needed

## 2013-11-25 NOTE — Telephone Encounter (Signed)
Duplicate

## 2013-11-25 NOTE — Telephone Encounter (Signed)
Pt is also requesting a new order for a nebulizer. Pt has one, but it is 76 yrs old and has been told that she is eligible for a new one. Please send Nebulizer order to [facility]. Rhonda J Cobb

## 2013-11-28 MED ORDER — ALBUTEROL SULFATE (2.5 MG/3ML) 0.083% IN NEBU: 2.5000 mg | INHALATION_SOLUTION | Freq: Four times a day (QID) | RESPIRATORY_TRACT | Status: DC

## 2013-11-28 NOTE — Addendum Note (Signed)
Addended by: Raymondo Band D on: 11/28/2013 09:28 AM   Modules accepted: Orders, Medications

## 2013-11-28 NOTE — Telephone Encounter (Addendum)
Called, spoke with pt.  Informed her of below recs per PW.  She would like to proceed with doing the albuterol neb qid and prn and would like albuterol rx sent [facility] [place].  Rx has been sent -- pt aware.  Med list updated.  Pt states she has spoke with [facility] about neb machine and this issue has been resolved.  Pt has a pending OV with PW on March 11.  She would like to come in prior to this and would like to come to [place] if possible.  We have scheduled pt to see TP on Thursday, March 5 in [place] -- pt aware and voiced no further questions or concerns at this time.

## 2013-11-29 ENCOUNTER — Ambulatory Visit: Payer: Medicare Other | Admitting: Critical Care Medicine

## 2013-12-01 ENCOUNTER — Ambulatory Visit (INDEPENDENT_AMBULATORY_CARE_PROVIDER_SITE_OTHER): Payer: Medicare Other | Admitting: Adult Health

## 2013-12-01 ENCOUNTER — Encounter: Payer: Self-pay | Admitting: Adult Health

## 2013-12-01 VITALS — BP 116/64 | HR 86 | Temp 98.7°F | Wt 110.0 lb

## 2013-12-01 DIAGNOSIS — J438 Other emphysema: Secondary | ICD-10-CM

## 2013-12-01 DIAGNOSIS — J439 Emphysema, unspecified: Secondary | ICD-10-CM

## 2013-12-01 NOTE — Patient Instructions (Signed)
So glad you are feeling better  Keep up good work in pulmonary rehab-very proud of your hard work.  Continue on Budesonide Neb Twice daily   Continue on Ipratropium Neb 3-4 times daily  Follow up Dr. Joya Gaskins  In 2-3 months at [facility] and As needed

## 2013-12-01 NOTE — Progress Notes (Signed)
Subjective:    Patient ID: Holly Page, female    DOB: 02-Dec-1935, 78 y.o.   MRN: [id number]  HPI 78 yo female seen for IOV with Dr. Joya Gaskins  On for COPD evaluation 10/11/13  ON nocturnal O2 .   Acute Office Visit 11/23/13 -- followed by Dr Joya Gaskins for emphysema / COPD (he has seen her once). She was started on tudorza, may have initially improved but then she believed that it was actually making things worse. She was given Anoro as a replacement, but she believes that the Anoro has bothered her also > more SOB, more SABA use, more wheeze and cough. She was started on Pred 2/24 by her PCP and feels some improvement. She didn't take the Anoro today.  >>d/c ANORO , changed to [employer] Twice daily  , and Atrovent Neb Four times a day     12/01/2013 Follow up COPD  Pt returns for follow up for COPD  Was seen for IOV in January, started on [facility] -did not tolerate. Seen 2 weeks ago changed over to [employer] Twice daily  , and Atrovent Neb Four times a day   Since then she feels she is doing much better. Has less DOE. No cough or wheezing. Feels the Neb work really well.  Denies hemoptysis , chest pain, orthopnea, or edema.  Going to pulmonary rehab at [facility].  Could not afford brovana (sent thru pharm and DME )  Budesonide and Ipratropium  Were affordable .    Studies:  Note exertional oxygenation normal 10/11/2013  Chest x-rays from prior visits reviewed and shows emphysematous change without acute infiltrate  Overnight oximetry may 2014: Intermittent hypoxemia with saturations in the mid-80% range proximally 37% of the evening  note based on this home oxygen was not ordered in May 2014  Spirometry 05/18/2012: FEV1 28% predicted FVC 76% predicted FEV1 to FVC ratio 27% predict  Pulmonary functions 01/23/2009: FEV1 54% predicted FVC 113% predicted FEV1 to FVC ratio 38% predicted diffusion capacity 51% predicted Spirometry 10/11/2013: FEV1 30% predicted FVC 60%  predicted FEV1 to FVC ratio 50 % predicted FEF 25-7538% predicted   Review of Systems Constitutional:   No  weight loss, night sweats,  Fevers, chills, fatigue, or  lassitude.  HEENT:   No headaches,  Difficulty swallowing,  Tooth/dental problems, or  Sore throat,                No sneezing, itching, ear ache, nasal congestion, post nasal drip,   CV:  No chest pain,  Orthopnea, PND, swelling in lower extremities, anasarca, dizziness, palpitations, syncope.   GI  No heartburn, indigestion, abdominal pain, nausea, vomiting, diarrhea, change in bowel habits, loss of appetite, bloody stools.   Resp:  No excess mucus, no productive cough,  No non-productive cough,  No coughing up of blood.  No change in color of mucus.  No wheezing.  No chest wall deformity  Skin: no rash or lesions.  GU: no dysuria, change in color of urine, no urgency or frequency.  No flank pain, no hematuria   MS:  No joint pain or swelling.  No decreased range of motion.  No back pain.  Psych:  No change in mood or affect. No depression or anxiety.  No memory loss.         Objective:   Physical Exam  GEN: A/Ox3; pleasant , NAD, well nourished   HEENT:  [place]/AT,  EACs-clear, TMs-wnl, NOSE-clear, THROAT-clear, no lesions, no postnasal drip or  exudate noted.   NECK:  Supple w/ fair ROM; no JVD; normal carotid impulses w/o bruits; no thyromegaly or nodules palpated; no lymphadenopathy.  RESP  Diminished in bases w/ no wheezing or rhonchi.no accessory muscle use, no dullness to percussion  CARD:  RRR, no m/r/g  , no peripheral edema, pulses intact, no cyanosis or clubbing.  GI:   Soft & nt; nml bowel sounds; no organomegaly or masses detected.  Musco: Warm bil, no deformities or joint swelling noted.   Neuro: alert, no focal deficits noted.    Skin: Warm, no lesions or rashes        Assessment & Plan:

## 2013-12-01 NOTE — Assessment & Plan Note (Addendum)
Recent flare now resolved  Improved compensation on present regimen -doing well on Nebs  Unable to afford brovana  Plan  So glad you are feeling better  Keep up good work in pulmonary rehab-very proud of your hard work.  Continue on Budesonide Neb Twice daily   Continue on Ipratropium Neb 3-4 times daily  Follow up Dr. Joya Gaskins  In 2-3 months at [facility] and As needed

## 2013-12-07 ENCOUNTER — Ambulatory Visit: Payer: Medicare Other | Admitting: Critical Care Medicine

## 2013-12-15 DIAGNOSIS — M4800 Spinal stenosis, site unspecified: Secondary | ICD-10-CM | POA: Insufficient documentation

## 2013-12-27 ENCOUNTER — Ambulatory Visit (INDEPENDENT_AMBULATORY_CARE_PROVIDER_SITE_OTHER): Payer: Medicare Other | Admitting: Critical Care Medicine

## 2013-12-27 ENCOUNTER — Encounter: Payer: Self-pay | Admitting: Critical Care Medicine

## 2013-12-27 VITALS — BP 142/78 | HR 72 | Temp 97.8°F | Ht 65.0 in | Wt 116.0 lb

## 2013-12-27 DIAGNOSIS — J438 Other emphysema: Secondary | ICD-10-CM

## 2013-12-27 DIAGNOSIS — J439 Emphysema, unspecified: Secondary | ICD-10-CM

## 2013-12-27 MED ORDER — BUDESONIDE 0.5 MG/2ML IN SUSP: 0.5000 mg | Freq: Every day | RESPIRATORY_TRACT | Status: DC

## 2013-12-27 MED ORDER — PREDNISONE 10 MG PO TABS: ORAL_TABLET | ORAL | Status: DC

## 2013-12-27 MED ORDER — IPRATROPIUM BROMIDE 0.02 % IN SOLN: 0.5000 mg | Freq: Four times a day (QID) | RESPIRATORY_TRACT | Status: DC

## 2013-12-27 NOTE — Progress Notes (Signed)
Subjective:    Patient ID: Holly Page, female    DOB: 16-Jan-1936, 78 y.o.   MRN: [id number]  HPI  78 yo female seen for IOV with Dr. Joya Gaskins  On for COPD evaluation 10/11/13  ON nocturnal O2 .   Acute Office Visit 11/23/13 -- followed by Dr Joya Gaskins for emphysema / COPD (he has seen her once). She was started on tudorza, may have initially improved but then she believed that it was actually making things worse. She was given Anoro as a replacement, but she believes that the Anoro has bothered her also > more SOB, more SABA use, more wheeze and cough. She was started on Pred 2/24 by her PCP and feels some improvement. She didn't take the Anoro today.  >>d/c ANORO , changed to [employer] Twice daily  , and Atrovent Neb Four times a day     11/2013 Follow up COPD  Pt returns for follow up for COPD  Was seen for IOV in January, started on [facility] -did not tolerate. Seen 2 weeks ago changed over to [employer] Twice daily  , and Atrovent Neb Four times a day   Since then she feels she is doing much better. Has less DOE. No cough or wheezing. Feels the Neb work really well.  Denies hemoptysis , chest pain, orthopnea, or edema.  Going to pulmonary rehab at [facility].  Could not afford brovana (sent thru pharm and DME )  Budesonide and Ipratropium  Were affordable .  12/27/2013 Chief Complaint  Patient presents with  . acute office visit    Increased sob for past week - After using pulmicort and Ipatropium pt states she has to Albuterol inhaler to settle her breathing -  Non prod cough - runny nose - [place] night sweats  More dyspneic this past week   Only able to afford, albuterol/ipratroprium/budesonide Cough is dry.  White mucus when comes up Notes some GERD and breakthrough the meds and not using gerd med daily   Studies:  Note exertional oxygenation normal 10/11/2013  Chest x-rays from prior visits reviewed and shows emphysematous change without acute  infiltrate  Overnight oximetry may 2014: Intermittent hypoxemia with saturations in the mid-80% range proximally 37% of the evening  note based on this home oxygen was not ordered in May 2014  Spirometry 05/18/2012: FEV1 28% predicted FVC 76% predicted FEV1 to FVC ratio 27% predict  Pulmonary functions 01/23/2009: FEV1 54% predicted FVC 113% predicted FEV1 to FVC ratio 38% predicted diffusion capacity 51% predicted Spirometry 10/11/2013: FEV1 30% predicted FVC 60% predicted FEV1 to FVC ratio 50 % predicted FEF 25-7538% predicted   Review of Systems  Constitutional:   No  weight loss, night sweats,  Fevers, chills, fatigue, or  lassitude.  HEENT:   No headaches,  Difficulty swallowing,  Tooth/dental problems, or  Sore throat,                No sneezing, itching, ear ache, nasal congestion, post nasal drip,   CV:  No chest pain,  Orthopnea, PND, swelling in lower extremities, anasarca, dizziness, palpitations, syncope.   GI  No heartburn, indigestion, abdominal pain, nausea, vomiting, diarrhea, change in bowel habits, loss of appetite, bloody stools.   Resp:  No excess mucus, no productive cough,  No non-productive cough,  No coughing up of blood.  No change in color of mucus.  No wheezing.  No chest wall deformity  Skin: no rash or lesions.  GU: no dysuria, change in  color of urine, no urgency or frequency.  No flank pain, no hematuria   MS:  No joint pain or swelling.  No decreased range of motion.  No back pain.  Psych:  No change in mood or affect. No depression or anxiety.  No memory loss.      Objective:   Physical Exam   GEN: A/Ox3; pleasant , NAD, well nourished   HEENT:  [place]/AT,  EACs-clear, TMs-wnl, NOSE-clear, THROAT-clear, no lesions, no postnasal drip or exudate noted.   NECK:  Supple w/ Page ROM; no JVD; normal carotid impulses w/o bruits; no thyromegaly or nodules palpated; no lymphadenopathy.  RESP  Diminished in bases w/ no wheezing or rhonchi.no accessory  muscle use, no dullness to percussion  CARD:  RRR, no m/r/g  , no peripheral edema, pulses intact, no cyanosis or clubbing.  GI:   Soft & nt; nml bowel sounds; no organomegaly or masses detected.  Musco: Warm bil, no deformities or joint swelling noted.   Neuro: alert, no focal deficits noted.    Skin: Warm, no lesions or rashes      Assessment & Plan:   COPD with emphysema Gold C Copd with gold C Mild exacerbation Plan Follow reflux diet  Prednisone 10mg  Take 4 for three days 3 for three days 2 for three days 1 for three days and stop Reduce budesonide daily Refills on ipratroprium/budesonide will be sent Use protonix daily Return 2 months    Updated Medication List Outpatient Encounter Prescriptions as of 12/27/2013  Medication Sig  . albuterol (PROVENTIL HFA;VENTOLIN HFA) 108 (90 BASE) MCG/ACT inhaler Inhale into the lungs every 6 (six) hours as needed for wheezing or shortness of breath.  [name] albuterol (PROVENTIL) (2.5 MG/3ML) 0.083% nebulizer solution Take 2.5 mg by nebulization every 6 (six) hours as needed for shortness of breath. And as needed  . budesonide (PULMICORT) 0.5 MG/2ML nebulizer solution Take 2 mLs (0.5 mg total) by nebulization daily.  [name] dextromethorphan-guaiFENesin (MUCINEX DM) 30-600 MG per 12 hr tablet Take 1 tablet by mouth as needed.   . fluticasone (FLONASE) 50 MCG/ACT nasal spray Place 2 sprays into both nostrils as needed.   [name] ipratropium (ATROVENT) 0.02 % nebulizer solution Take 2.5 mLs (0.5 mg total) by nebulization every 6 (six) hours.  . Loperamide HCl ([employer] ANTI-DIARRHEA PO) Take by mouth as needed.  . pantoprazole (PROTONIX) 40 MG tablet Take 1 tablet by mouth daily.  . verapamil (VERELAN PM) 240 MG 24 hr capsule Take 240 mg by mouth daily.  . [DISCONTINUED] albuterol (PROVENTIL) (2.5 MG/3ML) 0.083% nebulizer solution Take 3 mLs (2.5 mg total) by nebulization 4 (four) times daily. And as needed  . [DISCONTINUED] budesonide (PULMICORT) 0.5 MG/2ML  nebulizer solution Take 0.5 mg by nebulization 2 (two) times daily.  . [DISCONTINUED] ipratropium (ATROVENT) 0.02 % nebulizer solution Take 0.5 mg by nebulization every 6 (six) hours.   . predniSONE (DELTASONE) 10 MG tablet Take 4 tablets x 3 days, 3 tablets x 3 days, 2 tablets x 3 days, and 1 tablet x 3 days, then stop  . [DISCONTINUED] amLODipine (NORVASC) 10 MG tablet Take 10 mg by mouth daily.  . [DISCONTINUED] predniSONE (DELTASONE) 10 MG tablet Take 10 mg by mouth. Taper as directed

## 2013-12-27 NOTE — Assessment & Plan Note (Signed)
Copd with gold C Mild exacerbation Plan Follow reflux diet  Prednisone 10mg  Take 4 for three days 3 for three days 2 for three days 1 for three days and stop Reduce budesonide daily Refills on ipratroprium/budesonide will be sent Use protonix daily Return 2 months

## 2013-12-27 NOTE — Patient Instructions (Signed)
Follow reflux diet  Prednisone 10mg  Take 4 for three days 3 for three days 2 for three days 1 for three days and stop Reduce budesonide daily Refills on ipratroprium/budesonide will be sent Use protonix daily Return 2 months

## 2014-01-02 ENCOUNTER — Telehealth: Payer: Self-pay | Admitting: Critical Care Medicine

## 2014-01-02 NOTE — Telephone Encounter (Signed)
Mitzi Hansen from [employer] is calling due to pt needing Ensure Plus. He needs dx & Dr Bettina Gavia NPI & tax id in order. He is helping her to see if insurance will cover.  Ph # [phone].

## 2014-01-02 NOTE — Telephone Encounter (Signed)
Spoke with pt. She reports she contacted [facility] to get ensure plus assistance. She wants to know if we can send the needed information needed for her to get this? Please advise PW thanks

## 2014-01-02 NOTE — Telephone Encounter (Signed)
That is fine with me.

## 2014-01-02 NOTE — Telephone Encounter (Signed)
[place] and gave the information needed  I spoke with the pt and notified that this was done  She verbalized understanding and nothing further needed

## 2014-01-11 ENCOUNTER — Telehealth: Payer: Self-pay | Admitting: Critical Care Medicine

## 2014-01-11 NOTE — Telephone Encounter (Signed)
Spoke with pt. She reports she picked up budesonide neb yesterday. She has been on 0.25 mg dosage and d/t cost she cut this down to taking it QD. When refill sent for her budesonide on 12/27/13 RX was sent for 0.5 mg dose. Pt reports this was $47. She could barely afford the budesonide 0.25 mg at $37. She wants [place] to change the RX back to the budesonide 0.25 QD. Please advise PW thanks

## 2014-01-11 NOTE — Telephone Encounter (Signed)
Ok for 0.25mg  daily budesonide

## 2014-01-12 NOTE — Telephone Encounter (Signed)
Spoke with pt. She is aware okay to change back.  Since she has already picked up other RX she wants to finish this and then will call back for an RX. Nothing further needed

## 2014-01-19 ENCOUNTER — Encounter (HOSPITAL_BASED_OUTPATIENT_CLINIC_OR_DEPARTMENT_OTHER): Payer: Self-pay | Admitting: Emergency Medicine

## 2014-01-19 ENCOUNTER — Emergency Department (HOSPITAL_BASED_OUTPATIENT_CLINIC_OR_DEPARTMENT_OTHER)
Admission: EM | Admit: 2014-01-19 | Discharge: 2014-01-19 | Disposition: A | Discharge status: Home / Self Care | Payer: Medicare Other | Attending: Emergency Medicine | Admitting: Emergency Medicine

## 2014-01-19 DIAGNOSIS — Z86718 Personal history of other venous thrombosis and embolism: Secondary | ICD-10-CM | POA: Insufficient documentation

## 2014-01-19 DIAGNOSIS — M542 Cervicalgia: Secondary | ICD-10-CM | POA: Insufficient documentation

## 2014-01-19 DIAGNOSIS — J4489 Other specified chronic obstructive pulmonary disease: Secondary | ICD-10-CM | POA: Insufficient documentation

## 2014-01-19 DIAGNOSIS — Z79899 Other long term (current) drug therapy: Secondary | ICD-10-CM | POA: Insufficient documentation

## 2014-01-19 DIAGNOSIS — J449 Chronic obstructive pulmonary disease, unspecified: Secondary | ICD-10-CM | POA: Insufficient documentation

## 2014-01-19 DIAGNOSIS — Z8639 Personal history of other endocrine, nutritional and metabolic disease: Secondary | ICD-10-CM | POA: Insufficient documentation

## 2014-01-19 DIAGNOSIS — IMO0002 Reserved for concepts with insufficient information to code with codable children: Secondary | ICD-10-CM | POA: Insufficient documentation

## 2014-01-19 DIAGNOSIS — I10 Essential (primary) hypertension: Secondary | ICD-10-CM | POA: Insufficient documentation

## 2014-01-19 DIAGNOSIS — Z87891 Personal history of nicotine dependence: Secondary | ICD-10-CM | POA: Insufficient documentation

## 2014-01-19 DIAGNOSIS — Z8701 Personal history of pneumonia (recurrent): Secondary | ICD-10-CM | POA: Insufficient documentation

## 2014-01-19 DIAGNOSIS — Z862 Personal history of diseases of the blood and blood-forming organs and certain disorders involving the immune mechanism: Secondary | ICD-10-CM | POA: Insufficient documentation

## 2014-01-19 DIAGNOSIS — K219 Gastro-esophageal reflux disease without esophagitis: Secondary | ICD-10-CM | POA: Insufficient documentation

## 2014-01-19 MED ORDER — LORAZEPAM 1 MG PO TABS: 1.0000 mg | ORAL_TABLET | Freq: Once | ORAL | Status: DC

## 2014-01-19 MED ORDER — DIAZEPAM 5 MG PO TABS: 5.0000 mg | ORAL_TABLET | Freq: Two times a day (BID) | ORAL | Status: DC

## 2014-01-19 MED ORDER — ACETAMINOPHEN 325 MG PO TABS: 650.0000 mg | ORAL_TABLET | Freq: Four times a day (QID) | ORAL | Status: DC | PRN

## 2014-01-19 MED ORDER — ACETAMINOPHEN 325 MG PO TABS
650.0000 mg | ORAL_TABLET | Freq: Once | ORAL | Status: AC
  Administered 2014-01-19: 650 mg via ORAL
  Filled 2014-01-19: qty 2

## 2014-01-19 MED ORDER — DIAZEPAM 5 MG PO TABS
5.0000 mg | ORAL_TABLET | Freq: Once | ORAL | Status: AC
  Administered 2014-01-19: 5 mg via ORAL
  Filled 2014-01-19: qty 1

## 2014-01-19 NOTE — ED Notes (Signed)
Pt states she is having pain to the right side of her head and neck since yesterday morning.  Pt states movement makes it worse.  Denies pain to left side of head.  Pt having some aches in right shoulder/arm and right leg for about one month.

## 2014-01-19 NOTE — Discharge Instructions (Signed)
As discussed, your evaluation today has been largely reassuring.  But, it is important that you monitor your condition carefully, and do not hesitate to return to the ED if you develop new, or concerning changes in your condition.  Your pain is likely due to inflammation of the muscles in the back of your neck.  Please take all medication as directed.  You should also use ice packs liberally for pain control.  Otherwise, please follow-up with your physician for appropriate ongoing care.

## 2014-01-19 NOTE — ED Provider Notes (Signed)
CSN: [id number]     Arrival date & time 01/19/14  1155 History   First MD Initiated Contact with Patient 01/19/14 1222     Chief Complaint  Patient presents with  . Headache     HPI  This patient presents with concern of pain in the right upper lateral neck. Patient states that the pain became present yesterday morning upon awakening.  Since that time she's had intermittent episodes of pain focally in this area.  The pain radiates anteriorly in the supra-auricular area, but not to the occiput or top of the skull. The pain is sore, severe, improved with Tylenol.  There is no concurrent visual loss, vomiting, hearing loss, skin color changes, confusion, disorientation, disequilibrium or falling.   Past Medical History  Diagnosis Date  . Pneumonia   . COPD (chronic obstructive pulmonary disease)   . Hypertension   . DVT (deep venous thrombosis) 1980s    unsure which leg  . GERD (gastroesophageal reflux disease)   . Lactose intolerance    Past Surgical History  Procedure Laterality Date  . Foot surgery    . Cholecystectomy    . Tonsillectomy     Family History  Problem Relation Age of Onset  . Lupus Sister   . COPD Father   . Heart attack Son    History  Substance Use Topics  . Smoking status: Former Smoker -- 1.50 packs/day for 56 years    Types: Cigarettes    Quit date: 04/29/2012  . Smokeless tobacco: Never Used  . Alcohol Use: No     Comment: quit drinking beer 2005   OB History   Grav Para Term Preterm Abortions TAB SAB Ect Mult Living                 Review of Systems  Constitutional:       Per HPI, otherwise negative  HENT:       Per HPI, otherwise negative  Respiratory:       Per HPI, otherwise negative  Cardiovascular:       Per HPI, otherwise negative  Gastrointestinal: Negative for vomiting.  Endocrine:       Negative aside from HPI  Genitourinary:       Neg aside from HPI   Musculoskeletal:       Per HPI, otherwise negative  Skin: Negative.     Neurological: Negative for dizziness, tremors, seizures, syncope, facial asymmetry, speech difficulty, weakness, light-headedness and numbness.      Allergies  Aspirin and Hydrocodone  Home Medications   Prior to Admission medications   Medication Sig Start Date End Date Taking? Authorizing Provider  albuterol (PROVENTIL HFA;VENTOLIN HFA) 108 (90 BASE) MCG/ACT inhaler Inhale into the lungs every 6 (six) hours as needed for wheezing or shortness of breath.   Yes Historical Provider, MD  albuterol (PROVENTIL) (2.5 MG/3ML) 0.083% nebulizer solution Take 2.5 mg by nebulization every 6 (six) hours as needed for shortness of breath. And as needed 11/28/13  Yes Elsie Stain, MD  budesonide (PULMICORT) 0.5 MG/2ML nebulizer solution Take 2 mLs (0.5 mg total) by nebulization daily. 12/27/13  Yes Elsie Stain, MD  dextromethorphan-guaiFENesin [facility] DM) 30-600 MG per 12 hr tablet Take 1 tablet by mouth as needed.    Yes Historical Provider, MD  fluticasone (FLONASE) 50 MCG/ACT nasal spray Place 2 sprays into both nostrils as needed.    Yes Historical Provider, MD  ipratropium (ATROVENT) 0.02 % nebulizer solution Take 2.5 mLs (0.5 mg total)  by nebulization every 6 (six) hours. 12/27/13  Yes Elsie Stain, MD  Loperamide HCl ([employer] ANTI-DIARRHEA PO) Take by mouth as needed.   Yes Historical Provider, MD  pantoprazole (PROTONIX) 40 MG tablet Take 1 tablet by mouth daily.   Yes Historical Provider, MD  verapamil (VERELAN PM) 240 MG 24 hr capsule Take 240 mg by mouth daily.   Yes Historical Provider, MD  acetaminophen (TYLENOL) 325 MG tablet Take 2 tablets (650 mg total) by mouth every 6 (six) hours as needed. 01/19/14   Carmin Muskrat, MD  diazepam (VALIUM) 5 MG tablet Take 1 tablet (5 mg total) by mouth 2 (two) times daily. 01/19/14   Carmin Muskrat, MD   BP 137/77  Pulse 82  Temp(Src) 97.5 F (36.4 C) (Oral)  Resp 18  Ht 5\' 5"  (1.651 m)  Wt 115 lb (52.164 kg)  BMI 19.14 kg/m2  SpO2  95% Physical Exam  Nursing note and vitals reviewed. Constitutional: She is oriented to person, place, and time. She appears well-developed and well-nourished. No distress.  HENT:  Head: Normocephalic and atraumatic.  Eyes: Conjunctivae and EOM are normal.  Neck:    Cardiovascular: Normal rate and regular rhythm.   Pulmonary/Chest: Effort normal and breath sounds normal. No stridor. No respiratory distress.  Abdominal: She exhibits no distension.  Musculoskeletal: She exhibits no edema and no tenderness.  Neurological: She is alert and oriented to person, place, and time. She displays no atrophy and no tremor. No cranial nerve deficit or sensory deficit. She exhibits normal muscle tone. She displays no seizure activity. Coordination and gait normal.  Skin: Skin is warm and dry.  Psychiatric: She has a normal mood and affect.    ED Course  Procedures (including critical care time)  MDM   Final diagnoses:  Neck pain on right side    Patient presents with neck pain.  On exam she is awake, alert, neurologically intact and hemodynamically stable, in no distress.  With a reproducible pain, no neurologic deficits, and no evidence of distress, there is low suspicion for either CVA or vascular compromise or infection. Patient was discharged in stable condition after initiation of analgesics, muscle relaxants, cryotherapy.    Carmin Muskrat, MD 01/19/14 [phone]

## 2014-01-29 ENCOUNTER — Emergency Department (HOSPITAL_BASED_OUTPATIENT_CLINIC_OR_DEPARTMENT_OTHER): Payer: Medicare Other

## 2014-01-29 ENCOUNTER — Emergency Department (HOSPITAL_BASED_OUTPATIENT_CLINIC_OR_DEPARTMENT_OTHER)
Admission: EM | Admit: 2014-01-29 | Discharge: 2014-01-29 | Disposition: A | Discharge status: Home / Self Care | Payer: Medicare Other | Attending: Emergency Medicine | Admitting: Emergency Medicine

## 2014-01-29 ENCOUNTER — Encounter (HOSPITAL_BASED_OUTPATIENT_CLINIC_OR_DEPARTMENT_OTHER): Payer: Self-pay | Admitting: Emergency Medicine

## 2014-01-29 DIAGNOSIS — J449 Chronic obstructive pulmonary disease, unspecified: Secondary | ICD-10-CM | POA: Insufficient documentation

## 2014-01-29 DIAGNOSIS — M542 Cervicalgia: Secondary | ICD-10-CM | POA: Insufficient documentation

## 2014-01-29 DIAGNOSIS — Z86718 Personal history of other venous thrombosis and embolism: Secondary | ICD-10-CM | POA: Insufficient documentation

## 2014-01-29 DIAGNOSIS — Z8639 Personal history of other endocrine, nutritional and metabolic disease: Secondary | ICD-10-CM | POA: Insufficient documentation

## 2014-01-29 DIAGNOSIS — IMO0002 Reserved for concepts with insufficient information to code with codable children: Secondary | ICD-10-CM | POA: Insufficient documentation

## 2014-01-29 DIAGNOSIS — Z79899 Other long term (current) drug therapy: Secondary | ICD-10-CM | POA: Insufficient documentation

## 2014-01-29 DIAGNOSIS — R51 Headache: Secondary | ICD-10-CM | POA: Insufficient documentation

## 2014-01-29 DIAGNOSIS — K219 Gastro-esophageal reflux disease without esophagitis: Secondary | ICD-10-CM | POA: Insufficient documentation

## 2014-01-29 DIAGNOSIS — Z9981 Dependence on supplemental oxygen: Secondary | ICD-10-CM | POA: Insufficient documentation

## 2014-01-29 DIAGNOSIS — Z87891 Personal history of nicotine dependence: Secondary | ICD-10-CM | POA: Insufficient documentation

## 2014-01-29 DIAGNOSIS — Z8701 Personal history of pneumonia (recurrent): Secondary | ICD-10-CM | POA: Insufficient documentation

## 2014-01-29 DIAGNOSIS — I10 Essential (primary) hypertension: Secondary | ICD-10-CM | POA: Insufficient documentation

## 2014-01-29 DIAGNOSIS — Z862 Personal history of diseases of the blood and blood-forming organs and certain disorders involving the immune mechanism: Secondary | ICD-10-CM | POA: Insufficient documentation

## 2014-01-29 DIAGNOSIS — J4489 Other specified chronic obstructive pulmonary disease: Secondary | ICD-10-CM | POA: Insufficient documentation

## 2014-01-29 DIAGNOSIS — R519 Headache, unspecified: Secondary | ICD-10-CM

## 2014-01-29 LAB — CBC WITH DIFFERENTIAL/PLATELET
Basophils Absolute: 0 10*3/uL (ref 0.0–0.1)
Basophils Relative: 0 % (ref 0–1)
EOS ABS: 0.2 10*3/uL (ref 0.0–0.7)
Eosinophils Relative: 4 % (ref 0–5)
HCT: 40.6 % (ref 36.0–46.0)
HEMOGLOBIN: 12.8 g/dL (ref 12.0–15.0)
LYMPHS ABS: 1.7 10*3/uL (ref 0.7–4.0)
LYMPHS PCT: 37 % (ref 12–46)
MCH: 23.1 pg — ABNORMAL LOW (ref 26.0–34.0)
MCHC: 31.5 g/dL (ref 30.0–36.0)
MCV: 73.3 fL — AB (ref 78.0–100.0)
MONOS PCT: 12 % (ref 3–12)
Monocytes Absolute: 0.5 10*3/uL (ref 0.1–1.0)
NEUTROS PCT: 47 % (ref 43–77)
Neutro Abs: 2.1 10*3/uL (ref 1.7–7.7)
Platelets: 341 10*3/uL (ref 150–400)
RBC: 5.54 MIL/uL — AB (ref 3.87–5.11)
RDW: 16.1 % — ABNORMAL HIGH (ref 11.5–15.5)
WBC: 4.5 10*3/uL (ref 4.0–10.5)

## 2014-01-29 LAB — BASIC METABOLIC PANEL
BUN: 8 mg/dL (ref 6–23)
CO2: 31 meq/L (ref 19–32)
Calcium: 9.9 mg/dL (ref 8.4–10.5)
Chloride: 103 mEq/L (ref 96–112)
Creatinine, Ser: 0.5 mg/dL (ref 0.50–1.10)
GFR calc Af Amer: >90 mL/min (ref >90)
GFR calc non Af Amer: >90 mL/min (ref >90)
Glucose, Bld: 92 mg/dL (ref 70–99)
POTASSIUM: 3.9 meq/L (ref 3.7–5.3)
SODIUM: 146 meq/L (ref 137–147)

## 2014-01-29 MED ORDER — KETOROLAC TROMETHAMINE 30 MG/ML IJ SOLN
30.0000 mg | Freq: Once | INTRAMUSCULAR | Status: AC
  Administered 2014-01-29: 30 mg via INTRAVENOUS
  Filled 2014-01-29: qty 1

## 2014-01-29 MED ORDER — SODIUM CHLORIDE 0.9 % IV BOLUS (SEPSIS)
500.0000 mL | Freq: Once | INTRAVENOUS | Status: AC
  Administered 2014-01-29: 500 mL via INTRAVENOUS

## 2014-01-29 MED ORDER — DIAZEPAM 5 MG PO TABS
5.0000 mg | ORAL_TABLET | Freq: Once | ORAL | Status: AC
  Administered 2014-01-29: 5 mg via ORAL
  Filled 2014-01-29: qty 1

## 2014-01-29 MED ORDER — MAGNESIUM SULFATE 40 MG/ML IJ SOLN
2.0000 g | Freq: Once | INTRAMUSCULAR | Status: AC
Start: 2014-01-29 — End: 2014-01-29
  Administered 2014-01-29: 2 g via INTRAVENOUS
  Filled 2014-01-29: qty 50

## 2014-01-29 MED ORDER — IOHEXOL 350 MG/ML SOLN
100.0000 mL | Freq: Once | INTRAVENOUS | Status: AC | PRN
  Administered 2014-01-29: 100 mL via INTRAVENOUS

## 2014-01-29 NOTE — ED Provider Notes (Signed)
CSN: [id number]     Arrival date & time 01/29/14  1033 History   First MD Initiated Contact with Patient 01/29/14 1041     Chief Complaint  Patient presents with  . Headache     (Consider location/radiation/quality/duration/timing/severity/associated sxs/prior Treatment) HPI  This is a 78 year old female with a history of hypertension, COPD who presents with persistent headache. She reports right-sided neck pain and headache. She was here for the same April 23. Since that time she has been taking diazepam and additional muscle relaxants her. She reports daily headache. She states that the headache is sometimes worse when she bends over. She denies any vision changes, weakness, or neurologic changes. She see her primary care physician twice for the same.  She reports the pain as a dull ache over the right neck and right side of her head. She denies any fevers.    Past Medical History  Diagnosis Date  . Pneumonia   . COPD (chronic obstructive pulmonary disease)   . Hypertension   . DVT (deep venous thrombosis) 1980s    unsure which leg  . GERD (gastroesophageal reflux disease)   . Lactose intolerance    Past Surgical History  Procedure Laterality Date  . Foot surgery    . Cholecystectomy    . Tonsillectomy     Family History  Problem Relation Age of Onset  . Lupus Sister   . COPD Father   . Heart attack Son    History  Substance Use Topics  . Smoking status: Former Smoker -- 1.50 packs/day for 56 years    Types: Cigarettes    Quit date: 04/29/2012  . Smokeless tobacco: Never Used  . Alcohol Use: No     Comment: quit drinking beer 2005   OB History   Grav Para Term Preterm Abortions TAB SAB Ect Mult Living                 Review of Systems  Constitutional: Negative for fever.  Respiratory: Negative for cough, chest tightness and shortness of breath.   Cardiovascular: Negative for chest pain.  Gastrointestinal: Negative for nausea, vomiting and abdominal pain.   Genitourinary: Negative for dysuria.  Musculoskeletal: Positive for neck pain. Negative for back pain and neck stiffness.  Skin: Negative for wound.  Neurological: Positive for headaches. Negative for dizziness, seizures, speech difficulty, weakness and light-headedness.  Psychiatric/Behavioral: Negative for confusion.  All other systems reviewed and are negative.     Allergies  Tramadol; Aspirin; and Hydrocodone  Home Medications   Prior to Admission medications   Medication Sig Start Date End Date Taking? Authorizing Provider  tizanidine (ZANAFLEX) 2 MG capsule Take 2 mg by mouth 4 (four) times daily as needed for muscle spasms.   Yes Historical Provider, MD  acetaminophen (TYLENOL) 325 MG tablet Take 2 tablets (650 mg total) by mouth every 6 (six) hours as needed. 01/19/14   Carmin Muskrat, MD  albuterol (PROVENTIL HFA;VENTOLIN HFA) 108 (90 BASE) MCG/ACT inhaler Inhale into the lungs every 6 (six) hours as needed for wheezing or shortness of breath.    Historical Provider, MD  albuterol (PROVENTIL) (2.5 MG/3ML) 0.083% nebulizer solution Take 2.5 mg by nebulization every 6 (six) hours as needed for shortness of breath. And as needed 11/28/13   Elsie Stain, MD  budesonide (PULMICORT) 0.5 MG/2ML nebulizer solution Take 2 mLs (0.5 mg total) by nebulization daily. 12/27/13   Elsie Stain, MD  dextromethorphan-guaiFENesin [facility] DM) 30-600 MG per 12 hr tablet Take 1  tablet by mouth as needed.     Historical Provider, MD  fluticasone (FLONASE) 50 MCG/ACT nasal spray Place 2 sprays into both nostrils as needed.     Historical Provider, MD  ipratropium (ATROVENT) 0.02 % nebulizer solution Take 2.5 mLs (0.5 mg total) by nebulization every 6 (six) hours. 12/27/13   Elsie Stain, MD  Loperamide HCl ([facility] ANTI-DIARRHEA PO) Take by mouth as needed.    Historical Provider, MD  pantoprazole (PROTONIX) 40 MG tablet Take 1 tablet by mouth daily.    Historical Provider, MD  verapamil (VERELAN  PM) 240 MG 24 hr capsule Take 240 mg by mouth daily.    Historical Provider, MD   BP 149/79  Pulse 85  Temp(Src) 98.4 F (36.9 C) (Oral)  Resp 20  SpO2 94% Physical Exam  Nursing note and vitals reviewed. Constitutional: She is oriented to person, place, and time. No distress.  Elderly, appears younger than stated age  HENT:  Head: Normocephalic and atraumatic.  Mouth/Throat: Oropharynx is clear and moist.  Eyes: EOM are normal. Pupils are equal, round, and reactive to light.  Neck: Normal range of motion. Neck supple.  No meningismus noted, there is right-sided muscle spasm with tenderness to palpation  Cardiovascular: Normal rate, regular rhythm and normal heart sounds.   No murmur heard. Pulmonary/Chest: Effort normal. No respiratory distress.  Neurological: She is alert and oriented to person, place, and time.  5 strength in all 4 extremities, no dysmetria to finger-nose-finger, cranial nerves II through XII intact  Skin: Skin is warm and dry.  Psychiatric: She has a normal mood and affect.    ED Course  Procedures (including critical care time) Labs Review Labs Reviewed  CBC WITH DIFFERENTIAL - Abnormal; Notable for the following:    RBC 5.54 (*)    MCV 73.3 (*)    MCH 23.1 (*)    RDW 16.1 (*)    All other components within normal limits  BASIC METABOLIC PANEL    Imaging Review Ct Angio Head W/cm &/or Wo Cm  01/29/2014   CLINICAL DATA:  Continuous headaches and neck pain.  EXAM: CT ANGIOGRAPHY HEAD AND NECK  TECHNIQUE: Multidetector CT imaging of the head and neck was performed using the standard protocol during bolus administration of intravenous contrast. Multiplanar CT image reconstructions and MIPs were obtained to evaluate the vascular anatomy. Carotid stenosis measurements (when applicable) are obtained utilizing NASCET criteria, using the distal internal carotid diameter as the denominator.  CONTRAST:  175mL OMNIPAQUE IOHEXOL 350 MG/ML SOLN  COMPARISON:  MR HEAD  W/O CM dated 03/01/2010; DG CHEST 2 VIEW dated 10/03/2013; CT Head w/wo contrast dated 02/12/2010  FINDINGS: CTA HEAD FINDINGS  No evidence for acute infarction, hemorrhage, hydrocephalus, or extra-axial fluid. Normal for age cerebral volume. Moderately advanced subcortical and periventricular hypoattenuation, likely chronic microvascular ischemic change. Calvarium intact. No sinus or mastoid disease. Post infusion, no abnormal enhancement of the brain or meninges.  There is a 10 x 11 mm cross-section heavily calcified or ossified lesion projecting superiorly from the greater wing of the sphenoid on the left which was present in 2011 but slightly increased in size, likely slow growing meningioma. There may be minor peripheral enhancement but no regional vasogenic edema.  Non stenotic atheromatous change in the carotid siphons. ICA termini are widely patent. Basilar artery widely patent with left vertebral dominant. No intracranial stenosis, vascular occlusion, or aneurysm.  Review of the MIP images confirms the above findings.  CTA NECK FINDINGS  Minor  atheromatous change transverse arch. Severe emphysematous change at the lung apices. No pneumothorax. No carotid atherosclerotic narrowing. Minor carotid calcifications. Minor left carotid bifurcation web. Left vertebral dominant. No neck masses. Cervical spondylosis with disc space narrowing C4-C7. No worrisome osseous lesions.  Review of the MIP images confirms the above findings.  IMPRESSION: No extracranial or intracranial vascular stenosis or occlusion.  No acute intracranial findings.  Moderately advanced chronic microvascular ischemic change.  Suspected incidental 10 x 11 mm left greater sphenoid wing meningioma.  Severe COPD.  Moderately advanced cervical spondylosis.   Electronically Signed   By: Rolla Flatten M.D.   On: 01/29/2014 13:25   Ct Angio Neck W/cm &/or Wo/cm  01/29/2014   CLINICAL DATA:  Continuous headaches and neck pain.  EXAM: CT ANGIOGRAPHY HEAD  AND NECK  TECHNIQUE: Multidetector CT imaging of the head and neck was performed using the standard protocol during bolus administration of intravenous contrast. Multiplanar CT image reconstructions and MIPs were obtained to evaluate the vascular anatomy. Carotid stenosis measurements (when applicable) are obtained utilizing NASCET criteria, using the distal internal carotid diameter as the denominator.  CONTRAST:  170mL OMNIPAQUE IOHEXOL 350 MG/ML SOLN  COMPARISON:  MR HEAD W/O CM dated 03/01/2010; DG CHEST 2 VIEW dated 10/03/2013; CT Head w/wo contrast dated 02/12/2010  FINDINGS: CTA HEAD FINDINGS  No evidence for acute infarction, hemorrhage, hydrocephalus, or extra-axial fluid. Normal for age cerebral volume. Moderately advanced subcortical and periventricular hypoattenuation, likely chronic microvascular ischemic change. Calvarium intact. No sinus or mastoid disease. Post infusion, no abnormal enhancement of the brain or meninges.  There is a 10 x 11 mm cross-section heavily calcified or ossified lesion projecting superiorly from the greater wing of the sphenoid on the left which was present in 2011 but slightly increased in size, likely slow growing meningioma. There may be minor peripheral enhancement but no regional vasogenic edema.  Non stenotic atheromatous change in the carotid siphons. ICA termini are widely patent. Basilar artery widely patent with left vertebral dominant. No intracranial stenosis, vascular occlusion, or aneurysm.  Review of the MIP images confirms the above findings.  CTA NECK FINDINGS  Minor atheromatous change transverse arch. Severe emphysematous change at the lung apices. No pneumothorax. No carotid atherosclerotic narrowing. Minor carotid calcifications. Minor left carotid bifurcation web. Left vertebral dominant. No neck masses. Cervical spondylosis with disc space narrowing C4-C7. No worrisome osseous lesions.  Review of the MIP images confirms the above findings.  IMPRESSION: No  extracranial or intracranial vascular stenosis or occlusion.  No acute intracranial findings.  Moderately advanced chronic microvascular ischemic change.  Suspected incidental 10 x 11 mm left greater sphenoid wing meningioma.  Severe COPD.  Moderately advanced cervical spondylosis.   Electronically Signed   By: Rolla Flatten M.D.   On: 01/29/2014 13:25     EKG Interpretation None      MDM   Final diagnoses:  Headache  Hypertension    Patient presents with persistent headache for over 1 week.  Patient's initial blood pressures measured 213/96 with a pulse ox of 84.%.  Immediate repeat blood pressure 162/89. Patient states that she did take her blood pressure medications this morning. She also has known COPD and wears oxygen at night. She denies any shortness of breath or additional symptoms at this time. She is nonfocal on exam. No fever or meningismus noted and have low suspicion at this time for meningitis.  Given age, will obtain CT/CTA head and neck to evaluate for vascular malformations in aneurysm. CT  reassuring. There is an incidental meningioma. Patient was given normal saline, magnesium, and Valium. On recheck, her headache is 0/10. She still is reporting some neck discomfort.  Discussed with patient followup with PCP and will be given neurology referral.  Repeat blood pressure systolic in the 982M and RA O2 sat 94%.. Have low suspicion for hypertensive urgency/emergency this time.  Patient stated understanding.   After history, exam, and medical workup I feel the patient has been appropriately medically screened and is safe for discharge home. Pertinent diagnoses were discussed with the patient. Patient was given return precautions.     Merryl Hacker, MD 01/29/14 [phone]

## 2014-01-29 NOTE — Discharge Instructions (Signed)
You were evaluated today for headache.  The cause at this time is unknown.  CT and CTA head/neck is reassuring.  You need to continue supportive measures and follow-up with your PCP.  You will also be given neurology referral.  See return instructions below.  Headache A tension headache is a feeling of pain, pressure, or aching often felt over the front and sides of the head. The pain can be dull or can feel tight (constricting). It is the most common type of headache. Tension headaches are not normally associated with nausea or vomiting and do not get worse with physical activity. Tension headaches can last 30 minutes to several days.  CAUSES  The exact cause is not known, but it may be caused by chemicals and hormones in the brain that lead to pain. Tension headaches often begin after stress, anxiety, or depression. Other triggers may include:  Alcohol.  Caffeine (too much or withdrawal).  Respiratory infections (colds, flu, sinus infections).  Dental problems or teeth clenching.  Fatigue.  Holding your head and neck in one position too long while using a computer. SYMPTOMS   Pressure around the head.   Dull, aching head pain.   Pain felt over the front and sides of the head.   Tenderness in the muscles of the head, neck, and shoulders. DIAGNOSIS  A tension headache is often diagnosed based on:   Symptoms.   Physical examination.   A CT scan or MRI of your head. These tests may be ordered if symptoms are severe or unusual. TREATMENT  Medicines may be given to help relieve symptoms.  HOME CARE INSTRUCTIONS   Only take over-the-counter or prescription medicines for pain or discomfort as directed by your caregiver.   Lie down in a dark, quiet room when you have a headache.   Keep a journal to find out what may be triggering your headaches. For example, write down:  What you eat and drink.  How much sleep you get.  Any change to your diet or medicines.  Try  massage or other relaxation techniques.   Ice packs or heat applied to the head and neck can be used. Use these 3 to 4 times per day for 15 to 20 minutes each time, or as needed.   Limit stress.   Sit up straight, and do not tense your muscles.   Quit smoking if you smoke.  Limit alcohol use.  Decrease the amount of caffeine you drink, or stop drinking caffeine.  Eat and exercise regularly.  Get 7 to 9 hours of sleep, or as recommended by your caregiver.  Avoid excessive use of pain medicine as recurrent headaches can occur.  SEEK MEDICAL CARE IF:   You have problems with the medicines you were prescribed.  Your medicines do not work.  You have a change from the usual headache.  You have nausea or vomiting. SEEK IMMEDIATE MEDICAL CARE IF:   Your headache becomes severe.  You have a fever.  You have a stiff neck.  You have loss of vision.  You have muscular weakness or loss of muscle control.  You lose your balance or have trouble walking.  You feel faint or pass out.  You have severe symptoms that are different from your first symptoms. MAKE SURE YOU:   Understand these instructions.  Will watch your condition.  Will get help right away if you are not doing well or get worse. Document Released: 09/15/2005 Document Revised: 12/08/2011 Document Reviewed: 09/05/2011 [employer] Patient  Information 2014 [place], [place].  Hypertension As your heart beats, it forces blood through your arteries. This force is your blood pressure. If the pressure is too high, it is called hypertension (HTN) or high blood pressure. HTN is dangerous because you may have it and not know it. High blood pressure may mean that your heart has to work harder to pump blood. Your arteries may be narrow or stiff. The extra work puts you at risk for heart disease, stroke, and other problems.  Blood pressure consists of two numbers, a higher number over a lower, 110/72, for example. It is  stated as "110 over 72." The ideal is below 120 for the top number (systolic) and under 80 for the bottom (diastolic). Write down your blood pressure today. You should pay close attention to your blood pressure if you have certain conditions such as:  Heart failure.  Prior heart attack.  Diabetes  Chronic kidney disease.  Prior stroke.  Multiple risk factors for heart disease. To see if you have HTN, your blood pressure should be measured while you are seated with your arm held at the level of the heart. It should be measured at least twice. A one-time elevated blood pressure reading (especially in the Emergency Department) does not mean that you need treatment. There may be conditions in which the blood pressure is different between your right and left arms. It is important to see your caregiver soon for a recheck. Most people have essential hypertension which means that there is not a specific cause. This type of high blood pressure may be lowered by changing lifestyle factors such as:  Stress.  Smoking.  Lack of exercise.  Excessive weight.  Drug/tobacco/alcohol use.  Eating less salt. Most people do not have symptoms from high blood pressure until it has caused damage to the body. Effective treatment can often prevent, delay or reduce that damage. TREATMENT  When a cause has been identified, treatment for high blood pressure is directed at the cause. There are a large number of medications to treat HTN. These fall into several categories, and your caregiver will help you select the medicines that are best for you. Medications may have side effects. You should review side effects with your caregiver. If your blood pressure stays high after you have made lifestyle changes or started on medicines,   Your medication(s) may need to be changed.  Other problems may need to be addressed.  Be certain you understand your prescriptions, and know how and when to take your medicine.  Be  sure to follow up with your caregiver within the time frame advised (usually within two weeks) to have your blood pressure rechecked and to review your medications.  If you are taking more than one medicine to lower your blood pressure, make sure you know how and at what times they should be taken. Taking two medicines at the same time can result in blood pressure that is too low. SEEK IMMEDIATE MEDICAL CARE IF:  You develop a severe headache, blurred or changing vision, or confusion.  You have unusual weakness or numbness, or a faint feeling.  You have severe chest or abdominal pain, vomiting, or breathing problems. MAKE SURE YOU:   Understand these instructions.  Will watch your condition.  Will get help right away if you are not doing well or get worse. Document Released: 09/15/2005 Document Revised: 12/08/2011 Document Reviewed: 05/05/2008 [facility] Patient Information 2014 [place].

## 2014-01-29 NOTE — ED Notes (Signed)
Patient transported to CT 

## 2014-01-29 NOTE — ED Notes (Signed)
Pt continues to have headaches.  Seen here for same on 4/23.  Pt has followed up with PCP x 2.  Headache had diminished on Friday at pcp but worse on Saturday.

## 2014-02-02 ENCOUNTER — Ambulatory Visit (INDEPENDENT_AMBULATORY_CARE_PROVIDER_SITE_OTHER): Payer: Medicare Other | Admitting: Neurology

## 2014-02-02 ENCOUNTER — Encounter (INDEPENDENT_AMBULATORY_CARE_PROVIDER_SITE_OTHER): Payer: Self-pay

## 2014-02-02 ENCOUNTER — Encounter: Payer: Self-pay | Admitting: Neurology

## 2014-02-02 VITALS — BP 135/89 | HR 78 | Temp 98.5°F | Ht 64.5 in | Wt 116.0 lb

## 2014-02-02 DIAGNOSIS — M542 Cervicalgia: Secondary | ICD-10-CM

## 2014-02-02 DIAGNOSIS — G8929 Other chronic pain: Secondary | ICD-10-CM

## 2014-02-02 DIAGNOSIS — Z8489 Family history of other specified conditions: Secondary | ICD-10-CM

## 2014-02-02 DIAGNOSIS — D329 Benign neoplasm of meninges, unspecified: Secondary | ICD-10-CM

## 2014-02-02 DIAGNOSIS — R51 Headache: Secondary | ICD-10-CM

## 2014-02-02 DIAGNOSIS — M549 Dorsalgia, unspecified: Secondary | ICD-10-CM

## 2014-02-02 DIAGNOSIS — Z840 Family history of diseases of the skin and subcutaneous tissue: Secondary | ICD-10-CM

## 2014-02-02 DIAGNOSIS — D320 Benign neoplasm of cerebral meninges: Secondary | ICD-10-CM

## 2014-02-02 NOTE — Patient Instructions (Addendum)
Please remember, common headache triggers are: sleep deprivation, dehydration, overheating, stress, hypoglycemia or skipping meals and blood sugar fluctuations, excessive pain medications or excessive alcohol use or caffeine withdrawal. Some people have food triggers such as aged cheese, orange juice or chocolate, especially dark chocolate, or MSG (monosodium glutamate). Try to avoid these headache triggers as much possible. It may be helpful to keep a headache diary to figure out what makes your headaches worse or brings them on and what alleviates them. Some people report headache onset after exercise but studies have shown that regular exercise may actually prevent headaches from coming. If you have exercise-induced headaches, please make sure that you drink plenty of fluid before and after exercising and that you do not over do it and do not overheat.  Please use the Zanaflex only at night, due to sedation effects. Do not drive after taking Zanaflex.   I will refer you to neurosurgery for your history of meningioma.   I will do some blood work.

## 2014-02-02 NOTE — Progress Notes (Signed)
Subjective:    Patient ID: Holly Page is a 78 y.o. female.  HPI    Holly Age, MD, PhD [facility] [address] [place], [place] [address]  Dear Dr. Dina Page,  I saw  [name], upon your kind request in my neurologic clinic today for initial consultation of her recurrent headaches. The patient is accompanied by her son, Holly Page, today. As you know, Holly Page is a 78 year old right-handed woman with an underlying medical history of COPD, hypertension, DVT, reflux disease, lactose intolerance, status post foot surgery, cholecystectomy and tonsillectomy, who reports a recurrent headaches since last month. She also has a history of right-sided neck pain. She has seen her primary care physician for this as well. She presented to the emergency room in April with similar complaints and was recently seen on 01/29/2014 in the emergency room for HA x 1 week. She had a CT angiogram of the head and neck which I reviewed: No extracranial or intracranial vascular stenosis or occlusion. No acute intracranial findings. Moderately advanced chronic microvascular ischemic change. Suspected incidental 10 x 11 mm left greater sphenoid wing meningioma. Severe COPD. Moderately advanced cervical spondylosis. Upon presentation to the emergency room her blood pressure was elevated at 213/96 with a pulse ox reading of 84 but repeat blood pressure was improved at 162/89. She has known COPD and does have oxygen at night. In the ER, she was treated with IV fluids, magnesium and Valium and improved. She currently feels, her head is mildly aching in the back of the head, behind the R ear and in the R parietal area, 4/10 in severity.  Her PCP prescribed zanaflex bid, but it makes her drowsy. She has been driving.  Never had TIA or stroke symptoms, denying sudden onset of one sided weakness, numbness, tingling, slurring of speech or droopy face, hearing loss,  tinnitus, diplopia or visual field cut, blurry vision, eye pain or monocular loss of vision, and denies recurrent headaches before, but had headaches, when her children were small.  She has no associated N/V/photophobia. Her older sister died in her 06C with complications of her lupus.  She does not snore and uses her O2 at night.   Her Past Medical History Is Significant For: Past Medical History  Diagnosis Date  . Pneumonia   . COPD (chronic obstructive pulmonary disease)   . Hypertension   . DVT (deep venous thrombosis) 1980s    unsure which leg  . GERD (gastroesophageal reflux disease)   . Lactose intolerance     Her Past Surgical History Is Significant For: Past Surgical History  Procedure Laterality Date  . Foot surgery    . Cholecystectomy    . Tonsillectomy    . Tubal ligation      Her Family History Is Significant For: Family History  Problem Relation Page of Onset  . Lupus Sister   . COPD Father   . Heart attack Son     Her Social History Is Significant For: History   Social History  . Marital Status: Divorced    Spouse Name: N/A    Number of Children: 20  . Years of Education: college   Occupational History  . Retired    Social History Main Topics  . Smoking status: Former Smoker -- 1.50 packs/day for 56 years    Types: Cigarettes    Quit date: 04/29/2012  . Smokeless tobacco: Never Used  . Alcohol Use: No     Comment:  quit drinking beer 2005  . Drug Use: No  . Sexual Activity: None   Other Topics Concern  . None   Social History Narrative   Patient lives at home alone.   Caffeine Use: none   4 children (1 deceased)    Her Allergies Are:  Allergies  Allergen Reactions  . Tramadol Shortness Of Breath  . Aspirin     Heart flutter  . Hydrocodone     hallucinations  :   Her Current Medications Are:  Outpatient Encounter Prescriptions as of 02/02/2014  Medication Sig  . acetaminophen (TYLENOL) 325 MG tablet Take 2 tablets (650 mg total)  by mouth every 6 (six) hours as needed.  [name] albuterol (PROVENTIL HFA;VENTOLIN HFA) 108 (90 BASE) MCG/ACT inhaler Inhale into the lungs every 6 (six) hours as needed for wheezing or shortness of breath.  [name] albuterol (PROVENTIL) (2.5 MG/3ML) 0.083% nebulizer solution Take 2.5 mg by nebulization every 6 (six) hours as needed for shortness of breath. And as needed  . budesonide (PULMICORT) 0.5 MG/2ML nebulizer solution Take 2 mLs (0.5 mg total) by nebulization daily.  . fluticasone (FLONASE) 50 MCG/ACT nasal spray Place 2 sprays into both nostrils as needed.   [name] ipratropium (ATROVENT) 0.02 % nebulizer solution Take 2.5 mLs (0.5 mg total) by nebulization every 6 (six) hours.  . Loperamide HCl ([facility] ANTI-DIARRHEA PO) Take by mouth as needed.  . pantoprazole (PROTONIX) 40 MG tablet Take 1 tablet by mouth daily.  . tizanidine (ZANAFLEX) 2 MG capsule Take 2 mg by mouth 4 (four) times daily as needed for muscle spasms.  . verapamil (VERELAN PM) 240 MG 24 hr capsule Take 240 mg by mouth daily.  . [DISCONTINUED] dextromethorphan-guaiFENesin (MUCINEX DM) 30-600 MG per 12 hr tablet Take 1 tablet by mouth as needed.   :   Review of Systems:  Out of a complete 14 point review of systems, all are reviewed and negative with the exception of these symptoms as listed below:  Review of Systems  Constitutional: Positive for fatigue.  Respiratory: Positive for shortness of breath and wheezing.   Gastrointestinal: Positive for diarrhea.  Musculoskeletal: Positive for myalgias.  Neurological: Positive for dizziness and headaches.  Hematological: Bruises/bleeds easily.  Psychiatric/Behavioral:       Change in appetite    Objective:  Neurologic Exam  Physical Exam Physical Examination:   Filed Vitals:   02/02/14 0948  BP: 135/89  Pulse: 78  Temp: 98.5 F (36.9 C)    General Examination: The patient is a very pleasant 78 y.o. female in no acute distress. She appears well-developed and well-nourished and  well groomed. She is slender.   HEENT: Normocephalic, atraumatic, pupils are equal, round and reactive to light and accommodation. Funduscopic exam is normal with sharp disc margins noted. Extraocular tracking is good without limitation to gaze excursion or nystagmus noted. Normal smooth pursuit is noted. Hearing is grossly intact. Tympanic membranes are clear bilaterally. Face is symmetric with normal facial animation and normal facial sensation. Speech is clear with no dysarthria noted. There is no hypophonia. There is no lip, neck/head, jaw or voice tremor. Neck is supple with full range of passive and active motion. There are no carotid bruits on auscultation. Oropharynx exam reveals: moderate mouth dryness, adequate dental hygiene and moderate airway crowding, due to larger tongue and redundant soft palate. Mallampati is class II. Tongue protrudes centrally and palate elevates symmetrically. Tonsils are absent. Neck size is 13.25 inches. She has no palpable cord in her temporal  arteries and no tenderness on palpation.   Chest: Clear to auscultation without wheezing, rhonchi or crackles noted.  Heart: S1+S2+0, regular and normal without murmurs, rubs or gallops noted.   Abdomen: Soft, non-tender and non-distended with normal bowel sounds appreciated on auscultation.  Extremities: There is no pitting edema in the distal lower extremities bilaterally. Pedal pulses are intact.  Skin: Warm and dry without trophic changes noted. There are no varicose veins.  Musculoskeletal: exam reveals no obvious joint deformities, tenderness or joint swelling or erythema.   Neurologically:  Mental status: The patient is awake, alert and oriented in all 4 spheres. Her immediate and remote memory, attention, language skills and fund of knowledge are appropriate. There is no evidence of aphasia, agnosia, apraxia or anomia. Speech is clear with normal prosody and enunciation. Thought process is linear. Mood is normal  and affect is normal.  Cranial nerves II - XII are as described above under HEENT exam. In addition: shoulder shrug is normal with equal shoulder height noted. Motor exam: Normal bulk, strength and tone is noted. There is no drift, tremor or rebound. Romberg is negative. Reflexes are 2+ throughout. Babinski: Toes are flexor bilaterally. Fine motor skills and coordination: intact with normal finger taps, normal hand movements, normal rapid alternating patting, normal foot taps and normal foot agility.  Cerebellar testing: No dysmetria or intention tremor on finger to nose testing. Heel to shin is unremarkable bilaterally. There is no truncal or gait ataxia.  Sensory exam: intact to light touch, pinprick, vibration, temperature sense and proprioception in the upper and lower extremities.  Gait, station and balance: She stands easily. No veering to one side is noted. No leaning to one side is noted. Posture is Page-appropriate and stance is narrow based. Gait shows normal stride length and normal pace. No problems turning are noted. She turns en bloc. Tandem walk is unremarkable. Intact toe and heel stance is noted.               Assessment and Plan:   In summary, Holly Page is a very pleasant 78 y.o.-year old female with an underlying medical history of COPD, hypertension, DVT, reflux disease, lactose intolerance, status post foot surgery, cholecystectomy and tonsillectomy, who reports a recurrent headaches since last month. Her history is not in keeping with migraines and while she has some neck pain, her history and exam are not consistent with temporal arteritis. She has a meningioma, which was found on her CTA head. She currently has a mild HA and a normal neurological exam. I reassured the patient in that regard.  I had a long chat with the patient and her son about medical treatments and non-pharmacological approaches. We talked about maintaining a healthy lifestyle in general. I encouraged the  patient to eat healthy, exercise daily and keep well hydrated, to keep a scheduled bedtime and wake time routine, to not skip any meals and eat healthy snacks in between meals and to have protein with every meal.   I advised the patient about common headache triggers: sleep deprivation, dehydration, overheating, stress, hypoglycemia or skipping meals and blood sugar fluctuations, excessive pain medications or excessive alcohol use or caffeine withdrawal. Some people have food triggers such as aged cheese, orange juice or chocolate, especially dark chocolate, or MSG (monosodium glutamate). She is to try to avoid these headache triggers as much possible. It may be helpful to keep a headache diary to figure out what makes Her headaches worse or brings them on and what  alleviates them. Some people report headache onset after exercise but studies have shown that regular exercise may actually prevent headaches from coming. If She has exercise-induced headaches, She is advised to drink plenty of fluid before and after exercising and that to not overdo it and to not overheat.   As far as further diagnostic testing is concerned, I suggested the following today: referral to neurosurgery. I will check ESR, even though there is little concern for temporal arteritis, check RPR, ANA with reflex as well. She does have family history of lupus.   As far as medications are concerned, I recommended the following at this time: continue Zanflex prn, but take at night only, due to sedation reported. I reminded her not to drive after taking it. I answered all her questions today and the patient and her son were in agreement with the above outlined plan. I would like to see the patient back in 3 months, sooner if the need arises and encouraged her to call with any interim questions, concerns, problems or updates.   Thank you very much for allowing me to participate in the care of this nice patient. If I can be of any further  assistance to you please do not hesitate to call me at [phone].  Sincerely,   Holly Age, MD, PhD

## 2014-02-03 DIAGNOSIS — K2270 Barrett's esophagus without dysplasia: Secondary | ICD-10-CM | POA: Insufficient documentation

## 2014-02-03 LAB — C-REACTIVE PROTEIN: CRP: 1.7 mg/L (ref 0.0–4.9)

## 2014-02-03 LAB — ANA W/REFLEX: ANA: NEGATIVE

## 2014-02-03 LAB — RHEUMATOID FACTOR: Rhuematoid fact SerPl-aCnc: 7.5 IU/mL (ref 0.0–13.9)

## 2014-02-03 LAB — RPR: SYPHILIS RPR SCR: NONREACTIVE

## 2014-02-03 LAB — TSH: TSH: 1.35 u[IU]/mL (ref 0.450–4.500)

## 2014-02-03 NOTE — Progress Notes (Signed)
Quick Note:  Please advise patient that labs checked were fine. We checked thyroid screen and rheumatological screening and screening for inflammatory disorders. All test results were unremarkable. Star Age, MD, PhD [facility]  ______

## 2014-02-06 NOTE — Progress Notes (Signed)
Quick Note:  Shared lab results with patient , she verbalized understanding ______ 

## 2014-02-08 ENCOUNTER — Telehealth: Payer: Self-pay | Admitting: Neurology

## 2014-02-08 NOTE — Telephone Encounter (Signed)
Information was given to Richardson Landry, CMA, Dr. Guadelupe Sabin assistant.

## 2014-02-08 NOTE — Telephone Encounter (Signed)
I spoke with Holly Page: Her referral has been re-faxed to [facility] and receipt was confirmed at 3:51 PM.  I also gave her their phone [phone]) and contact person - [place].  Ms. Honda thanked me for the call and had no further questions.

## 2014-02-08 NOTE — Telephone Encounter (Signed)
Richardson Landry: Please followup on her surgery referral, thx

## 2014-02-09 ENCOUNTER — Telehealth: Payer: Self-pay | Admitting: Critical Care Medicine

## 2014-02-09 MED ORDER — BUDESONIDE 0.25 MG/2ML IN SUSP: 0.2500 mg | Freq: Every day | RESPIRATORY_TRACT | Status: DC

## 2014-02-09 NOTE — Telephone Encounter (Signed)
Pt calling and requesting a change in the dose of her Budesonide neb Rx Pt currently taking Budesonide .5mg  QD--wanting to decrease again to Budesonide .25 QD Per patient Dr Joya Gaskins had given the okay last month for this but she had a box left to finish up of the .5mg  dose and wanted to finish this first. Per telephone note 01/11/14:  Inge Rise, CMA at 01/11/2014 10:18 AM     Status: Signed        Spoke with pt. She reports she picked up budesonide neb yesterday. She has been on 0.25 mg dosage and d/t cost she cut this down to taking it QD. When refill sent for her budesonide on 12/27/13 RX was sent for 0.5 mg dose. Pt reports this was $47. She could barely afford the budesonide 0.25 mg at $37. She wants [place] to change the RX back to the budesonide 0.25 QD. Please advise PW thanks   Elsie Stain, MD at 01/11/2014 10:04 PM    Status: [place] for 0.25mg  daily budesonide      [facility] [place], [place] Pt aware this being sent Pt advised that if her breathing worsens in any way once she makes this change and starts taking the decreased dose, she needs to contact our office. Expressed understanding.   Nothing further needed.

## 2014-02-10 DIAGNOSIS — R51 Headache: Secondary | ICD-10-CM

## 2014-02-10 DIAGNOSIS — D329 Benign neoplasm of meninges, unspecified: Secondary | ICD-10-CM | POA: Insufficient documentation

## 2014-02-10 DIAGNOSIS — R519 Headache, unspecified: Secondary | ICD-10-CM | POA: Insufficient documentation

## 2014-02-13 ENCOUNTER — Telehealth: Payer: Self-pay | Admitting: Neurology

## 2014-02-13 NOTE — Telephone Encounter (Signed)
LMVM for referral dept Freda Munro) re: faxed referral for pt.  Meningioma and new onset headaches.

## 2014-02-13 NOTE — Telephone Encounter (Signed)
Patient calling to state that she has not heard anything regarding her referral to [facility] and states she has been waiting for 2 weeks now. Please call and advise patient.

## 2014-02-14 ENCOUNTER — Telehealth: Payer: Self-pay | Admitting: Critical Care Medicine

## 2014-02-14 MED ORDER — PREDNISONE 10 MG PO TABS: ORAL_TABLET | ORAL | Status: DC

## 2014-02-14 NOTE — Telephone Encounter (Signed)
Call in prednisone 10mg  Take 4 for two days three for two days two for two days one for two days #20   Keep OV 5/28

## 2014-02-14 NOTE — Telephone Encounter (Signed)
Called spoke with pt. C/o increased wheezing, SOB w/ exertion x 1 week. No chest tx, no coughing, no f/c/s/n/v. She has appt scheduled with PW 02/23/14 in [place]. Pt wants prednisone called in. Pt did not want to come in for OV until her scheduled appt. Please advise PW thanks  Allergies  Allergen Reactions  . Tramadol Shortness Of Breath  . Aspirin     Heart flutter  . Hydrocodone     hallucinations

## 2014-02-14 NOTE — Telephone Encounter (Signed)
Spoke with the pt and notified of recs per PW  She verbalized understanding  Rx was sent to pharm

## 2014-02-16 NOTE — Telephone Encounter (Signed)
Spoke with [facility] and patient has been scheduled 02/21/14@3 :00,arrival time is 2:30,patient is aware.

## 2014-02-16 NOTE — Telephone Encounter (Signed)
I called and LMVM for Holly Page, with Dr. Saintclair Halsted re: NP appt referral on this pt.  ( asking to send to [facility] or [place]) about appt.

## 2014-02-21 ENCOUNTER — Telehealth: Payer: Self-pay | Admitting: Neurology

## 2014-02-21 NOTE — Telephone Encounter (Signed)
[facility] calling to make Dr. Rexene Alberts aware that pt has an appt today at 3:30 pm with Dr. Christella Noa. FYI

## 2014-02-21 NOTE — Telephone Encounter (Signed)
calling to let us know patient has an appt today at 3:30 with Dr. Christella Noa. doesn't know who called because no name was left on her VM

## 2014-02-23 ENCOUNTER — Ambulatory Visit (INDEPENDENT_AMBULATORY_CARE_PROVIDER_SITE_OTHER): Payer: Medicare Other | Admitting: Critical Care Medicine

## 2014-02-23 ENCOUNTER — Encounter: Payer: Self-pay | Admitting: Critical Care Medicine

## 2014-02-23 VITALS — BP 140/74 | HR 71 | Temp 98.0°F | Ht 65.0 in | Wt 114.0 lb

## 2014-02-23 DIAGNOSIS — J439 Emphysema, unspecified: Secondary | ICD-10-CM

## 2014-02-23 DIAGNOSIS — K219 Gastro-esophageal reflux disease without esophagitis: Secondary | ICD-10-CM

## 2014-02-23 DIAGNOSIS — J438 Other emphysema: Secondary | ICD-10-CM

## 2014-02-23 NOTE — Patient Instructions (Signed)
Stop ipratroprium Continue albuterol 3-4 x daily in neb Cont budesonide daily Cont stomach pill daily Return 4 months

## 2014-02-23 NOTE — Assessment & Plan Note (Signed)
GERD playing a major role in [place] Stay on PPI daily

## 2014-02-23 NOTE — Assessment & Plan Note (Signed)
Recent exac of copd now improve Intolerant of anticholingerics Plan D/c atrovent Cont albuterol in neb tid-qid Cont budesonide daily Cont oxygen qhs

## 2014-02-23 NOTE — Progress Notes (Signed)
Subjective:    Patient ID: Holly Page, female    DOB: [birth year], 78 y.o.   MRN: [id number]  HPI  78 yo female seen for IOV with Dr. Joya Gaskins  On for COPD evaluation 10/11/13  ON nocturnal O2 .   Acute Office Visit 11/23/13 -- followed by Dr Joya Gaskins for emphysema / COPD (he has seen her once). She was started on tudorza, may have initially improved but then she believed that it was actually making things worse. She was given Anoro as a replacement, but she believes that the Anoro has bothered her also > more SOB, more SABA use, more wheeze and cough. She was started on Pred 2/24 by her PCP and feels some improvement. She didn't take the Anoro today.  >>d/c ANORO , changed to [employer] Twice daily  , and Atrovent Neb Four times a day     11/2013 Follow up COPD  Pt returns for follow up for COPD  Was seen for IOV in January, started on [facility] -did not tolerate. Seen 2 weeks ago changed over to [employer] Twice daily  , and Atrovent Neb Four times a day   Since then she feels she is doing much better. Has less DOE. No cough or wheezing. Feels the Neb work really well.  Denies hemoptysis , chest pain, orthopnea, or edema.  Going to pulmonary rehab at [facility].  Could not afford brovana (sent thru pharm and DME )  Budesonide and Ipratropium  Were affordable .  12/27/2013 Chief Complaint  Patient presents with  . acute office visit    Increased sob for past week - After using pulmicort and Ipatropium pt states she has to Albuterol inhaler to settle her breathing -  Non prod cough - runny nose - Occsa night sweats  More dyspneic this past week   Only able to afford, albuterol/ipratroprium/budesonide Cough is dry.  White mucus when comes up Notes some GERD and breakthrough the meds and not using gerd med daily   Studies:  Note exertional oxygenation normal 10/11/2013  Chest x-rays from prior visits reviewed and shows emphysematous change without acute  infiltrate  Overnight oximetry may 2014: Intermittent hypoxemia with saturations in the mid-80% range proximally 37% of the evening  note based on this home oxygen was not ordered in May 2014  Spirometry 05/18/2012: FEV1 28% predicted FVC 76% predicted FEV1 to FVC ratio 27% predict  Pulmonary functions 01/23/2009: FEV1 54% predicted FVC 113% predicted FEV1 to FVC ratio 38% predicted diffusion capacity 51% predicted Spirometry 10/11/2013: FEV1 30% predicted FVC 60% predicted FEV1 to FVC ratio 50 % predicted FEF 25-7538% predicted  02/23/2014 Chief Complaint  Patient presents with  . 2 month follow up    pred rx was sent on 5/19 - pt states SOB and wheezing have improved.  Has occas nonprod cough.  No chest tightness/pain.  SOB gets worse after using ipratripium neb.  Pt had more GERD and got worse.  Pred pulse , now off. Dyspnea is better. Pt finished pulm rehab  Pt denies any significant sore throat, nasal congestion or excess secretions, fever, chills, sweats, unintended weight loss, pleurtic or exertional chest pain, orthopnea PND, or leg swelling Pt denies any increase in rescue therapy over baseline, denies waking up needing it or having any early am or nocturnal exacerbations of coughing/wheezing/or dyspnea. Pt also denies any obvious fluctuation in symptoms with  weather or environmental change or other alleviating or aggravating factors    Review of Systems  Constitutional:   No  weight loss, night sweats,  Fevers, chills, fatigue, or  lassitude.  HEENT:   No headaches,  Difficulty swallowing,  Tooth/dental problems, or  Sore throat,                No sneezing, itching, ear ache, nasal congestion, post nasal drip,   CV:  No chest pain,  Orthopnea, PND, swelling in lower extremities, anasarca, dizziness, palpitations, syncope.   GI  +++ heartburn, +++indigestion, abdominal pain, nausea, vomiting, diarrhea, change in bowel habits, loss of appetite, bloody stools.   Resp:  No  excess mucus, no productive cough,  No non-productive cough,  No coughing up of blood.  No change in color of mucus.  No wheezing.  No chest wall deformity  Skin: no rash or lesions.  GU: no dysuria, change in color of urine, no urgency or frequency.  No flank pain, no hematuria   MS:  No joint pain or swelling.  No decreased range of motion.  No back pain.  Psych:  No change in mood or affect. No depression or anxiety.  No memory loss.      Objective:   Physical Exam  BP 140/74  Pulse 71  Temp(Src) 98 F (36.7 C) (Oral)  Ht 5\' 5"  (1.651 m)  Wt 114 lb (51.71 kg)  BMI 18.97 kg/m2  SpO2 98%  GEN: A/Ox3; pleasant , NAD, well nourished   HEENT:  [place]/AT,  EACs-clear, TMs-wnl, NOSE-clear, THROAT-clear, no lesions, no postnasal drip or exudate noted.   NECK:  Supple w/ fair ROM; no JVD; normal carotid impulses w/o bruits; no thyromegaly or nodules palpated; no lymphadenopathy.  RESP  Diminished in bases w/ no wheezing or rhonchi.no accessory muscle use, no dullness to percussion  CARD:  RRR, no m/r/g  , no peripheral edema, pulses intact, no cyanosis or clubbing.  GI:   Soft & nt; nml bowel sounds; no organomegaly or masses detected.  Musco: Warm bil, no deformities or joint swelling noted.   Neuro: alert, no focal deficits noted.    Skin: Warm, no lesions or rashes      Assessment & Plan:   GERD (gastroesophageal reflux disease) GERD playing a major role in copd exac Plan Stay on PPI daily  COPD with emphysema Gold C Recent exac of copd now improve Intolerant of anticholingerics Plan D/c atrovent Cont albuterol in neb tid-qid Cont budesonide daily Cont oxygen qhs    Updated Medication List Outpatient Encounter Prescriptions as of 02/23/2014  Medication Sig  . acetaminophen (TYLENOL) 325 MG tablet Take 2 tablets (650 mg total) by mouth every 6 (six) hours as needed.  [name] albuterol (PROVENTIL HFA;VENTOLIN HFA) 108 (90 BASE) MCG/ACT inhaler Inhale into the lungs  every 6 (six) hours as needed for wheezing or shortness of breath.  [name] albuterol (PROVENTIL) (2.5 MG/3ML) 0.083% nebulizer solution Take 2.5 mg by nebulization every 6 (six) hours as needed for shortness of breath. And as needed  . budesonide (PULMICORT) 0.25 MG/2ML nebulizer solution Take 2 mLs (0.25 mg total) by nebulization daily.  . fluticasone (FLONASE) 50 MCG/ACT nasal spray Place 2 sprays into both nostrils as needed.   . Loperamide HCl ([facility] ANTI-DIARRHEA PO) Take by mouth as needed.  [name] LYRICA 25 MG capsule Take 1 capsule by mouth 2 (two) times daily.  . pantoprazole (PROTONIX) 40 MG tablet Take 1 tablet by mouth daily.  . verapamil (VERELAN PM) 240 MG 24 hr capsule Take 240 mg by mouth daily.  . [DISCONTINUED]  ipratropium (ATROVENT) 0.02 % nebulizer solution Take 2.5 mLs (0.5 mg total) by nebulization every 6 (six) hours.  . [DISCONTINUED] budesonide (PULMICORT) 0.5 MG/2ML nebulizer solution Take 2 mLs (0.5 mg total) by nebulization daily.  . [DISCONTINUED] predniSONE (DELTASONE) 10 MG tablet 4 x 2 days, 3 x 2 days, 2 x 2 days, 1 x 2 days then stop  . [DISCONTINUED] tizanidine (ZANAFLEX) 2 MG capsule Take 2 mg by mouth 4 (four) times daily as needed for muscle spasms.

## 2014-03-03 ENCOUNTER — Telehealth: Payer: Self-pay | Admitting: Critical Care Medicine

## 2014-03-03 MED ORDER — PREDNISONE 10 MG PO TABS: ORAL_TABLET | ORAL | Status: DC

## 2014-03-03 NOTE — Telephone Encounter (Signed)
Spoke with pt Pt c/o increased SOB and very dry throat x 8 days Last seen 02/23/14 by PW Pt states that since completing Prednisone 02/22/14 SOB has worsened. Using all meds as directed.  Denies chest tightness/pain/cough  [facility] [address] Allergies  Allergen Reactions  . Tramadol Shortness Of Breath  . Aspirin     Heart flutter  . Hydrocodone     hallucinations   Please advise Dr Joya Gaskins. Thanks.

## 2014-03-03 NOTE — Telephone Encounter (Signed)
Appt made for ROV 03/06/14 at 930 with PW Called in Prednisone 20mg  qd x 7days, then decrease to 10mg  daily #[facility] [place]  Nothing further needed.

## 2014-03-03 NOTE — Telephone Encounter (Signed)
Resume prednisone 20mg  /d x 7days and then 10mg  per day and stay Needs rov with either myself or TP next week

## 2014-03-06 ENCOUNTER — Telehealth: Payer: Self-pay | Admitting: Critical Care Medicine

## 2014-03-06 ENCOUNTER — Ambulatory Visit (INDEPENDENT_AMBULATORY_CARE_PROVIDER_SITE_OTHER): Payer: Medicare Other | Admitting: Critical Care Medicine

## 2014-03-06 ENCOUNTER — Encounter: Payer: Self-pay | Admitting: Critical Care Medicine

## 2014-03-06 VITALS — BP 148/76 | HR 74 | Temp 97.1°F | Ht 64.0 in | Wt 116.8 lb

## 2014-03-06 DIAGNOSIS — J439 Emphysema, unspecified: Secondary | ICD-10-CM

## 2014-03-06 DIAGNOSIS — J019 Acute sinusitis, unspecified: Secondary | ICD-10-CM

## 2014-03-06 DIAGNOSIS — J438 Other emphysema: Secondary | ICD-10-CM

## 2014-03-06 MED ORDER — ARFORMOTEROL TARTRATE 15 MCG/2ML IN NEBU: 15.0000 ug | INHALATION_SOLUTION | Freq: Two times a day (BID) | RESPIRATORY_TRACT | Status: DC

## 2014-03-06 MED ORDER — CEFUROXIME AXETIL 500 MG PO TABS: 500.0000 mg | ORAL_TABLET | Freq: Two times a day (BID) | ORAL | Status: DC

## 2014-03-06 NOTE — Patient Instructions (Addendum)
Try samples of brovana twice a day, while we get you access Use albuterol as needed Continue  prednisone taper Take ceftin twice daily for 5 days  [place] 2 [place]

## 2014-03-06 NOTE — Addendum Note (Signed)
Addended by: Raymondo Band D on: 03/06/2014 10:05 AM   Modules accepted: Orders

## 2014-03-06 NOTE — Assessment & Plan Note (Signed)
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 Current exacerbations Pt  intolerant of anticholinergics including Spiriva, anoro, Atrovent Plan Try samples of brovana twice a day, will try to get the pt access Use albuterol as needed Continue  prednisone taper Take ceftin twice daily for 5 days  Return 2 weeks [place]

## 2014-03-06 NOTE — Progress Notes (Signed)
Subjective:    Patient ID: Holly Page, female    DOB: Mar 25, 1936, 78 y.o.   MRN: [id number]  HPI  78 y.o. F with Gold C Copd primary emphysema   03/06/2014 Chief Complaint  Patient presents with  . Acute Visit    increased SOB at rest and with activity x 1 wk, aching in chest, sneezing, prod cough with clear mucus, and wheezing.  Used albuterol hfa and albuterol neb yesterday with no relief.  Has been through [place] and anoro and atrovent and did not tolerate When off pred got worse.  Now back on prednisone, not much help over weekend.   Mucus out of nose clear. No discolored mucus. No chest pain.  Not smoking. Notes clear mucus out of the nose   Review of Systems  Constitutional:   No  weight loss, night sweats,  Fevers, chills, fatigue, or  lassitude.  HEENT:   No headaches,  Difficulty swallowing,  Tooth/dental problems, or  Sore throat,                No sneezing, itching, ear ache, nasal congestion, post nasal drip,   CV:  No chest pain,  Orthopnea, PND, swelling in lower extremities, anasarca, dizziness, palpitations, syncope.   GI  + heartburn, +indigestion, abdominal pain, nausea, vomiting, diarrhea, change in bowel habits, loss of appetite, bloody stools.   Resp:  Notes excess mucus, notes productive cough,  Notes non-productive cough,  No coughing up of blood.  No change in color of mucus.  No wheezing.  No chest wall deformity  Skin: no rash or lesions.  GU: no dysuria, change in color of urine, no urgency or frequency.  No flank pain, no hematuria   MS:  No joint pain or swelling.  No decreased range of motion.  No back pain.  Psych:  No change in mood or affect. No depression or anxiety.  No memory loss.      Objective:   Physical Exam  BP 148/76  Pulse 74  Temp(Src) 97.1 F (36.2 C) (Oral)  Ht 5\' 4"  (1.626 m)  Wt 116 lb 12.8 oz (52.98 kg)  BMI 20.04 kg/m2  SpO2 97%  GEN: A/Ox3; pleasant , NAD, well nourished   HEENT:  [place]/AT,  EACs-clear,  TMs-wnl, NOSE-clear, THROAT-clear, no lesions, nasal inflammation with mild purulence  NECK:  Supple w/ fair ROM; no JVD; normal carotid impulses w/o bruits; no thyromegaly or nodules palpated; no lymphadenopathy.  RESP  Diminished in bases w/ no wheezing or rhonchi.no accessory muscle use, no dullness to percussion  CARD:  RRR, no m/r/g  , no peripheral edema, pulses intact, no cyanosis or clubbing.  GI:   Soft & nt; nml bowel sounds; no organomegaly or masses detected.  Musco: Warm bil, no deformities or joint swelling noted.   Neuro: alert, no focal deficits noted.    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 Current exacerbations Pt  intolerant of anticholinergics including Spiriva, anoro, Atrovent Plan Try samples of brovana twice a day, will try to get the pt access Use albuterol as needed Continue  prednisone taper Take ceftin twice daily for 5 days  Return 2 weeks [place]     note physical exam also is consistent with mild acute sinusitis The Ceftin will cover this as well  Updated Medication List Outpatient Encounter Prescriptions as of 03/06/2014  Medication Sig  . acetaminophen (TYLENOL) 325 MG tablet Take 2 tablets (650 mg total) by mouth every 6 (six) hours as needed.  [name] albuterol (PROVENTIL HFA;VENTOLIN HFA) 108 (90 BASE) MCG/ACT inhaler Inhale into the lungs every 6 (six) hours as needed for wheezing or shortness of breath.  [name] albuterol (PROVENTIL) (2.5 MG/3ML) 0.083% nebulizer solution Take 2.5 mg by nebulization every 6 (six) hours as needed for shortness of breath. And as needed  . budesonide (PULMICORT) 0.25 MG/2ML nebulizer solution Take 2 mLs (0.25 mg total) by nebulization daily.  . fluticasone (FLONASE) 50 MCG/ACT nasal spray Place 2 sprays into both nostrils as needed.   . Loperamide HCl ([facility] ANTI-DIARRHEA PO) Take by mouth as needed.  [name] LYRICA 25 MG  capsule Take 1 capsule by mouth 2 (two) times daily.  . pantoprazole (PROTONIX) 40 MG tablet Take 1 tablet by mouth daily.  . predniSONE (DELTASONE) 10 MG tablet Take 20mg  daily x 7 days, then decrease to 10mg  everyday.  . verapamil (VERELAN PM) 240 MG 24 hr capsule Take 240 mg by mouth daily.  [name] arformoterol (BROVANA) 15 MCG/2ML NEBU Take 2 mLs (15 mcg total) by nebulization 2 (two) times daily.  . cefUROXime (CEFTIN) 500 MG tablet Take 1 tablet (500 mg total) by mouth 2 (two) times daily.

## 2014-03-06 NOTE — Telephone Encounter (Signed)
Called spoke with pt. Advised her to were to look on box. The medication giving to her has not expired. Nothing further needed

## 2014-03-16 ENCOUNTER — Telehealth: Payer: Self-pay | Admitting: Critical Care Medicine

## 2014-03-16 NOTE — Telephone Encounter (Signed)
I called spoke with pt. She thinks the prednisone is causing her to have diarrhea. She takes pred 10 mg daily. Reports she is having about 304 loose stools daily. Pt reports this started happening when she started prednisone. She was taking pred 20 mg daily x 7 days then dropped to pred 10 mg daily. Pt wants to know if she is taking to much prednisone at a time? Any recs? Pt has not tried OTC medications for diarrhea. Please advise Dr. Joya Gaskins thanks  Allergies  Allergen Reactions  . Tramadol Shortness Of Breath  . Amlodipine     Other reaction(s): SWELLING  . Aspirin     Other reaction(s): PALPITATIONS Other reaction(s): DIFFICULTY BREATHING Heart flutter  . Codeine Other (See Comments)    hallucinations   . Doxycycline     Other reaction(s): NAUSEA,VOMITING  . Hydrocodone     hallucinations  . Losartan   . Propoxyphene     Other reaction(s): NAUSEA  . Tiotropium

## 2014-03-16 NOTE — Telephone Encounter (Signed)
Prednisone does not cause diarrhea but i would stop this for now See pcp for diarrhea issues

## 2014-03-16 NOTE — Telephone Encounter (Signed)
I called spoke w/ pt. Aware of recs. Nothing further needed

## 2014-03-30 ENCOUNTER — Ambulatory Visit (HOSPITAL_BASED_OUTPATIENT_CLINIC_OR_DEPARTMENT_OTHER)
Admission: RE | Admit: 2014-03-30 | Discharge: 2014-03-30 | Disposition: A | Discharge status: Home / Self Care | Payer: Medicare Other | Source: Ambulatory Visit | Attending: Critical Care Medicine | Admitting: Critical Care Medicine

## 2014-03-30 ENCOUNTER — Encounter: Payer: Self-pay | Admitting: Critical Care Medicine

## 2014-03-30 ENCOUNTER — Ambulatory Visit (INDEPENDENT_AMBULATORY_CARE_PROVIDER_SITE_OTHER): Payer: Medicare Other | Admitting: Critical Care Medicine

## 2014-03-30 ENCOUNTER — Telehealth: Payer: Self-pay | Admitting: Critical Care Medicine

## 2014-03-30 VITALS — BP 124/72 | HR 67 | Temp 98.0°F | Ht 64.0 in | Wt 114.8 lb

## 2014-03-30 DIAGNOSIS — J449 Chronic obstructive pulmonary disease, unspecified: Secondary | ICD-10-CM | POA: Insufficient documentation

## 2014-03-30 DIAGNOSIS — J9612 Chronic respiratory failure with hypercapnia: Secondary | ICD-10-CM

## 2014-03-30 DIAGNOSIS — J4489 Other specified chronic obstructive pulmonary disease: Secondary | ICD-10-CM | POA: Insufficient documentation

## 2014-03-30 DIAGNOSIS — J432 Centrilobular emphysema: Secondary | ICD-10-CM

## 2014-03-30 DIAGNOSIS — R0902 Hypoxemia: Secondary | ICD-10-CM

## 2014-03-30 DIAGNOSIS — J9610 Chronic respiratory failure, unspecified whether with hypoxia or hypercapnia: Secondary | ICD-10-CM

## 2014-03-30 DIAGNOSIS — J9611 Chronic respiratory failure with hypoxia: Secondary | ICD-10-CM

## 2014-03-30 DIAGNOSIS — J438 Other emphysema: Secondary | ICD-10-CM

## 2014-03-30 DIAGNOSIS — J9621 Acute and chronic respiratory failure with hypoxia: Secondary | ICD-10-CM | POA: Insufficient documentation

## 2014-03-30 MED ORDER — PREDNISONE 10 MG PO TABS: ORAL_TABLET | ORAL | Status: DC

## 2014-03-30 NOTE — Assessment & Plan Note (Signed)
Chronic resp failure  Plan Needs oxygen 24/7 2 liters

## 2014-03-30 NOTE — Progress Notes (Signed)
Subjective:    Patient ID: Holly Page, female    DOB: 08-Jul-1936, 78 y.o.   MRN: [id number]  HPI  78 y.o. F with Gold C Copd primary emphysema  03/30/2014 Chief Complaint  Patient presents with  . Follow-up    Pt reports she is still feeling SOB but O2 levels are staying in the 90's. C/o ahcing all the time in chest, wheezing x yesterday. Also c/o dry cough, PND, nasal congestion. She uses her albuterol neb twice daily. Pt also uses 2 liters O2 at bedtime and PRN daytime when she is having hard time breathing. She does not feel the brovana helps.    Pt may be a bit worse despite above. Has to use oxygen during the day but more short of breath  Chest hurts if takes a deep breath. No real mucus, if does will be clear. Pt will choke.  No fever.  Mucus out of the nose is clear .  Review of Systems  Constitutional:   No  weight loss, night sweats,  Fevers, chills, fatigue, or  lassitude.  HEENT:   No headaches,  Difficulty swallowing,  Tooth/dental problems, or  Sore throat,                No sneezing, itching, ear ache, nasal congestion, post nasal drip,   CV:  No chest pain,  Orthopnea, PND, swelling in lower extremities, anasarca, dizziness, palpitations, syncope.   GI  No heartburn, no indigestion, abdominal pain, nausea, vomiting, diarrhea, change in bowel habits, loss of appetite, bloody stools.   Resp:  No excess mucus, no productive cough,  Notes non-productive cough,  No coughing up of blood.  No change in color of mucus.  No wheezing.  No chest wall deformity  Skin: no rash or lesions.  GU: no dysuria, change in color of urine, no urgency or frequency.  No flank pain, no hematuria   MS:  No joint pain or swelling.  No decreased range of motion.  No back pain.  Psych:  No change in mood or affect. No depression or anxiety.  No memory loss.      Objective:   Physical Exam  BP 124/72  Pulse 67  Temp(Src) 98 F (36.7 C) (Oral)  Ht 5\' 4"  (1.626 m)  Wt 114 lb  12.8 oz (52.073 kg)  BMI 19.70 kg/m2  SpO2 95%  GEN: A/Ox3; pleasant , NAD, well nourished   HEENT:  [place]/AT,  EACs-clear, TMs-wnl, NOSE-clear, THROAT-clear, no lesions, nasal inflammation with mild purulence  NECK:  Supple w/ fair ROM; no JVD; normal carotid impulses w/o bruits; no thyromegaly or nodules palpated; no lymphadenopathy.  RESP  Diminished in bases w/ no wheezing or rhonchi.no accessory muscle use, no dullness to percussion  CARD:  RRR, no m/r/g  , no peripheral edema, pulses intact, no cyanosis or clubbing.  GI:   Soft & nt; nml bowel sounds; no organomegaly or masses detected.  Musco: Warm bil, no deformities or joint swelling noted.   Neuro: alert, no focal deficits noted.    Skin: Warm, no lesions or rashes  With ambulation on RA desats to 86%     Assessment & Plan:   COPD with emphysema Gold C Gold C Copd with recurrent exacerbations d/t high ozone f/t heat wave Note now has exertional desaturation Plan Resume prednisone 10mg : Take 4 for three days 3 for three days 2 for three days then one daily Stay on brovana twice daily, budesonide daily Use oxygen  2Liters exertion and at rest   Chronic respiratory failure Chronic resp failure  Plan Needs oxygen 24/7 2 liters    note physical exam also is consistent with mild acute sinusitis The Ceftin will cover this as well  Updated Medication List Outpatient Encounter Prescriptions as of 03/30/2014  Medication Sig  . acetaminophen (TYLENOL) 325 MG tablet Take 2 tablets (650 mg total) by mouth every 6 (six) hours as needed.  [name] albuterol (PROVENTIL HFA;VENTOLIN HFA) 108 (90 BASE) MCG/ACT inhaler Inhale into the lungs every 6 (six) hours as needed for wheezing or shortness of breath.  [name] albuterol (PROVENTIL) (2.5 MG/3ML) 0.083% nebulizer solution Take 2.5 mg by nebulization every 6 (six) hours as needed for shortness of breath. And as needed  . arformoterol (BROVANA) 15 MCG/2ML NEBU Take 2 mLs (15 mcg total) by  nebulization 2 (two) times daily.  . budesonide (PULMICORT) 0.25 MG/2ML nebulizer solution Take 0.25 mg by nebulization daily.  . fluticasone (FLONASE) 50 MCG/ACT nasal spray Place 2 sprays into both nostrils as needed.   . hydrochlorothiazide (MICROZIDE) 12.5 MG capsule Take 12.5 mg by mouth. Once daily  . Loperamide HCl ([facility] ANTI-DIARRHEA PO) Take by mouth as needed.  [name] LYRICA 25 MG capsule Take 1 capsule by mouth. 1-2 times daily  . ranitidine (ZANTAC) 300 MG tablet Take 300 mg by mouth. 1 tablet at bedtime  . verapamil (VERELAN PM) 240 MG 24 hr capsule Take 240 mg by mouth daily.  . [DISCONTINUED] budesonide (PULMICORT) 0.25 MG/2ML nebulizer solution Take 2 mLs (0.25 mg total) by nebulization daily.  . predniSONE (DELTASONE) 10 MG tablet Take 4 for three days 3 for three days 2 for three days then one daily  . [DISCONTINUED] cefUROXime (CEFTIN) 500 MG tablet Take 1 tablet (500 mg total) by mouth 2 (two) times daily.  . [DISCONTINUED] pantoprazole (PROTONIX) 40 MG tablet Take 1 tablet by mouth daily.  . [DISCONTINUED] predniSONE (DELTASONE) 10 MG tablet Take 20mg  daily x 7 days, then decrease to 10mg  everyday.

## 2014-03-30 NOTE — Telephone Encounter (Signed)
Crystal Damian Leavell, RN at 03/30/2014 1:53 PM     Status: Signed        Called, spoke with pt. Pt states she received a shipment of Brovana from [place] and received the bill today. States the bill is for >$80. Pt reports a lady from [place] came to her home and had her fill out a form to include information on rent, income, etc. For the financial hardship program through [place]. Pt states she was advised at that time that the [place] should not be more than $40/mo. Pt states she never gave the approval to ship the brovana and is requesting on how to proceed given she cannot afford the $80/mo payment.  I called [facility], spoke with Iris. She cannot see the billing information from her side. She rec the pt call the billing # found on the bill. They can provide pt with a detailed explanation. Iris did state the financial hardship program doesn't help with deductibles but does help with co-insurance.  Called, spoke with pt. She does have the billing # to [employer]. She is aware of above per Iris. She will call the billing dept to inquire further about the cost of the bill and about the financial hardship program she thought she had already applied for. Pt is aware to call office back if they advise it will cont to be >$80/mo to see if PW can advise on alternatives as she cannot afford that price. She verbalized understanding of recs, is in agreement with this plan, and voiced no further questions or concerns at this time.    Called [employer] with the number the pt provided [phone]) and was informed by Cadela that the pt does qualify for the hardship program and was able to take 30% off the price which lowered the price to $59. The pt stated that she is still unable to pay that much money every month. I informed pt that we can get this information to PW to see if there are alternatives that are available for the pt. Pt verbalized understanding and denied any further questions or concerns at this time. Will forward message to Crystal to  make aware of status.   PW please advise if there are alternatives to the [facility].

## 2014-03-30 NOTE — Telephone Encounter (Signed)
Only alternative is albuterol or duoneb in nebulizer 4 times daily

## 2014-03-30 NOTE — Patient Instructions (Addendum)
Resume prednisone 10mg : Take 4 for three days 3 for three days 2 for three days then one daily Stay on brovana twice daily, budesonide daily Use oxygen 2Liters exertion and at rest Chest xray today Return 2 months

## 2014-03-30 NOTE — Progress Notes (Signed)
Quick Note:  Notify the patient that the Xray is stable and no pneumonia No change in medications are recommended. Continue current meds as prescribed at last office visit ______ 

## 2014-03-30 NOTE — Assessment & Plan Note (Signed)
Gold C Copd with recurrent exacerbations d/t high ozone f/t heat wave Note now has exertional desaturation Plan Resume prednisone 10mg : Take 4 for three days 3 for three days 2 for three days then one daily Stay on brovana twice daily, budesonide daily Use oxygen 2Liters exertion and at rest

## 2014-03-30 NOTE — Progress Notes (Signed)
Quick Note:  Called, spoke with pt. Informed her of cxr results and recs per Dr. Joya Gaskins. She verbalized understanding. ______

## 2014-03-30 NOTE — Telephone Encounter (Signed)
Called, spoke with pt.  Pt states she received a shipment of Brovana from [place] and received the bill today.  States the bill is for >$80.  Pt reports a lady from [place] came to her home and had her fill out a form to include information on rent, income, etc. For the financial hardship program through [place].  Pt states she was advised at that time that the [place] should not be more than $40/mo.  Pt states she never gave the approval to ship the brovana and is requesting on how to proceed given she cannot afford the $80/mo payment.  I called [employer], spoke with Iris.  She cannot see the billing information from her side.  She rec the pt call the billing # found on the bill.  They can provide pt with a detailed explanation.  Iris did state the financial hardship program doesn't help with deductibles but does help with co-insurance.  Called, spoke with pt. She does have the billing # to [employer].  She is aware of above per Iris.  She will call the billing dept to inquire further about the cost of the bill and about the financial hardship program she thought she had already applied for.  Pt is aware to call office back if they advise it will cont to be >$80/mo to see if PW can advise on alternatives as she cannot afford that price.  She verbalized understanding of recs, is in agreement with this plan, and voiced no further questions or concerns at this time.

## 2014-03-30 NOTE — Telephone Encounter (Signed)
Made new note.

## 2014-04-03 NOTE — Telephone Encounter (Signed)
lmomtcb x1 for pt 

## 2014-04-03 NOTE — Telephone Encounter (Signed)
Called and spoke with pt and she is needing a letter from PW so she can get help with her trash cans being moved to the street for her---since she is not able to move these.  She stated that the letter will have to state her name the way it is on her bill--Holly Page and her acct # is  [phone].  The fax # is [phone]  ATTN:  [facility].    Pt also stated that she is willing to try the albuterol QID and pt already has this at home.  She is wanting to know if she should cont to take the budesonide via neb as well.   PW please advise. Thanks  Allergies  Allergen Reactions  . Tramadol Shortness Of Breath  . Amlodipine     Other reaction(s): SWELLING  . Aspirin     Other reaction(s): PALPITATIONS Other reaction(s): DIFFICULTY BREATHING Heart flutter  . Codeine Other (See Comments)    hallucinations   . Doxycycline     Other reaction(s): NAUSEA,VOMITING  . Hydrocodone     hallucinations hallucinations  . Losartan   . Propoxyphene     Other reaction(s): NAUSEA  . Tiotropium      Current Outpatient Prescriptions on File Prior to Visit  Medication Sig Dispense Refill  . acetaminophen (TYLENOL) 325 MG tablet Take 2 tablets (650 mg total) by mouth every 6 (six) hours as needed.  15 tablet  0  . albuterol (PROVENTIL HFA;VENTOLIN HFA) 108 (90 BASE) MCG/ACT inhaler Inhale into the lungs every 6 (six) hours as needed for wheezing or shortness of breath.      [name] albuterol (PROVENTIL) (2.5 MG/3ML) 0.083% nebulizer solution Take 2.5 mg by nebulization every 6 (six) hours as needed for shortness of breath. And as needed      . arformoterol (BROVANA) 15 MCG/2ML NEBU Take 2 mLs (15 mcg total) by nebulization 2 (two) times daily.  120 mL  6  . budesonide (PULMICORT) 0.25 MG/2ML nebulizer solution Take 0.25 mg by nebulization daily.      . fluticasone (FLONASE) 50 MCG/ACT nasal spray Place 2 sprays into both nostrils as needed.       . hydrochlorothiazide (MICROZIDE) 12.5 MG capsule  Take 12.5 mg by mouth. Once daily      . Loperamide HCl ([facility] ANTI-DIARRHEA PO) Take by mouth as needed.      [name] LYRICA 25 MG capsule Take 1 capsule by mouth. 1-2 times daily      . predniSONE (DELTASONE) 10 MG tablet Take 4 for three days 3 for three days 2 for three days then one daily  60 tablet  6  . ranitidine (ZANTAC) 300 MG tablet Take 300 mg by mouth. 1 tablet at bedtime      . verapamil (VERELAN PM) 240 MG 24 hr capsule Take 240 mg by mouth daily.       No current facility-administered medications on file prior to visit.

## 2014-04-03 NOTE — Telephone Encounter (Signed)
1.  Pt is returning call & can be reached at [phone].     NEW ISSUE:   2.  Pt would like to know if she can get a letter regarding her garbage can & getting someone to help her with her trash.     Holly Page

## 2014-04-04 NOTE — Telephone Encounter (Signed)
Called spoke with patient and advised of PW's recommendations as below.  Pt voiced her understanding.  Letter done and faxed to the [place].

## 2014-04-04 NOTE — Telephone Encounter (Signed)
Stay on budesonide and albuterol i am ok with the letter

## 2014-04-05 ENCOUNTER — Telehealth: Payer: Self-pay | Admitting: Critical Care Medicine

## 2014-04-05 NOTE — Telephone Encounter (Signed)
noted 

## 2014-04-05 NOTE — Telephone Encounter (Signed)
Called spoke w/ pt. She reports she is going to finish up the brovana she has at home so it does not go to waste. FYI for [place]. Will forward to Dr. Joya Gaskins so he is aware

## 2014-04-24 DIAGNOSIS — Z86010 Personal history of colonic polyps: Secondary | ICD-10-CM | POA: Insufficient documentation

## 2014-04-24 DIAGNOSIS — R0902 Hypoxemia: Secondary | ICD-10-CM | POA: Insufficient documentation

## 2014-04-24 DIAGNOSIS — I440 Atrioventricular block, first degree: Secondary | ICD-10-CM | POA: Insufficient documentation

## 2014-04-24 DIAGNOSIS — K589 Irritable bowel syndrome without diarrhea: Secondary | ICD-10-CM | POA: Insufficient documentation

## 2014-04-24 DIAGNOSIS — I491 Atrial premature depolarization: Secondary | ICD-10-CM | POA: Insufficient documentation

## 2014-04-25 ENCOUNTER — Telehealth: Payer: Self-pay | Admitting: Critical Care Medicine

## 2014-04-25 NOTE — Telephone Encounter (Signed)
Pt calling wanting to know if she still continues on the prednisone 10 mg now she is down to QD. I advised her per last OV note yes she stays at 10 mg. Nothing further needed

## 2014-05-01 ENCOUNTER — Telehealth: Payer: Self-pay | Admitting: Critical Care Medicine

## 2014-05-01 MED ORDER — ALBUTEROL SULFATE HFA 108 (90 BASE) MCG/ACT IN AERS: 2.0000 | INHALATION_SPRAY | Freq: Four times a day (QID) | RESPIRATORY_TRACT | Status: DC | PRN

## 2014-05-01 NOTE — Telephone Encounter (Signed)
Called spoke with pt. Aware RX has been sent. Nothing further needed 

## 2014-05-15 ENCOUNTER — Telehealth: Payer: Self-pay | Admitting: Critical Care Medicine

## 2014-05-15 DIAGNOSIS — M7630 Iliotibial band syndrome, unspecified leg: Secondary | ICD-10-CM | POA: Insufficient documentation

## 2014-05-15 NOTE — Telephone Encounter (Signed)
Spoke with pt and advised of MR's recommendations to cut Prednisone to 5mg  daily and f/u with Dr Valora Piccolo for leg pain.  Pt verbalized understanding.  F/U appt scheduled with Dr Joya Gaskins.

## 2014-05-15 NOTE — Telephone Encounter (Signed)
Spoke with pt.  Pt c/o pain in right leg from thigh to knee area for past 2 weeks.  Has some swelling in thigh area. Pt seen PCP 2 weeks ago and had xrays done that didn't show anything..  Pt states that she has been on Pred 10mg /day since July and had similar symptoms when she was on Prednisone before.  Pt wants to know if this is related and should she stop Prednisone or adjust dose.  Please advise.  (Sending to doc of day)

## 2014-05-15 NOTE — Telephone Encounter (Signed)
   She really needs to come in at some point to discuss pednisone taper because is a complicated process in someone on it long time  but at this point a) she can cut it down 5mg  daily to continue; b) sort out leg pain with PCP JOBE,DANIEL B., MD which could be from other causes  Dr. Brand Males, M.D., [facility].C.P Pulmonary and Critical Care Medicine Staff Physician [facility] Pager: [phone], If no answer or between  15:00h - 7:00h: call [phone]  05/15/2014 9:07 AM

## 2014-05-15 NOTE — Telephone Encounter (Signed)
LMTC x 1  

## 2014-05-18 ENCOUNTER — Emergency Department (HOSPITAL_BASED_OUTPATIENT_CLINIC_OR_DEPARTMENT_OTHER): Payer: Medicare Other

## 2014-05-18 ENCOUNTER — Emergency Department (HOSPITAL_BASED_OUTPATIENT_CLINIC_OR_DEPARTMENT_OTHER)
Admission: EM | Admit: 2014-05-18 | Discharge: 2014-05-18 | Disposition: A | Discharge status: Home / Self Care | Payer: Medicare Other | Attending: Emergency Medicine | Admitting: Emergency Medicine

## 2014-05-18 ENCOUNTER — Encounter (HOSPITAL_BASED_OUTPATIENT_CLINIC_OR_DEPARTMENT_OTHER): Payer: Self-pay | Admitting: Emergency Medicine

## 2014-05-18 DIAGNOSIS — M5430 Sciatica, unspecified side: Secondary | ICD-10-CM | POA: Insufficient documentation

## 2014-05-18 DIAGNOSIS — Z79899 Other long term (current) drug therapy: Secondary | ICD-10-CM | POA: Insufficient documentation

## 2014-05-18 DIAGNOSIS — Z87891 Personal history of nicotine dependence: Secondary | ICD-10-CM | POA: Diagnosis not present

## 2014-05-18 DIAGNOSIS — Z8701 Personal history of pneumonia (recurrent): Secondary | ICD-10-CM | POA: Diagnosis not present

## 2014-05-18 DIAGNOSIS — Z86718 Personal history of other venous thrombosis and embolism: Secondary | ICD-10-CM | POA: Diagnosis not present

## 2014-05-18 DIAGNOSIS — M79609 Pain in unspecified limb: Secondary | ICD-10-CM | POA: Insufficient documentation

## 2014-05-18 DIAGNOSIS — IMO0002 Reserved for concepts with insufficient information to code with codable children: Secondary | ICD-10-CM | POA: Insufficient documentation

## 2014-05-18 DIAGNOSIS — M5431 Sciatica, right side: Secondary | ICD-10-CM

## 2014-05-18 DIAGNOSIS — J449 Chronic obstructive pulmonary disease, unspecified: Secondary | ICD-10-CM | POA: Insufficient documentation

## 2014-05-18 DIAGNOSIS — K219 Gastro-esophageal reflux disease without esophagitis: Secondary | ICD-10-CM | POA: Diagnosis not present

## 2014-05-18 DIAGNOSIS — Z862 Personal history of diseases of the blood and blood-forming organs and certain disorders involving the immune mechanism: Secondary | ICD-10-CM | POA: Diagnosis not present

## 2014-05-18 DIAGNOSIS — I10 Essential (primary) hypertension: Secondary | ICD-10-CM | POA: Insufficient documentation

## 2014-05-18 DIAGNOSIS — Z8639 Personal history of other endocrine, nutritional and metabolic disease: Secondary | ICD-10-CM | POA: Insufficient documentation

## 2014-05-18 DIAGNOSIS — J4489 Other specified chronic obstructive pulmonary disease: Secondary | ICD-10-CM | POA: Insufficient documentation

## 2014-05-18 HISTORY — DX: Spinal stenosis, site unspecified: M48.00

## 2014-05-18 MED ORDER — IBUPROFEN 800 MG PO TABS
800.0000 mg | ORAL_TABLET | Freq: Once | ORAL | Status: AC
  Administered 2014-05-18: 800 mg via ORAL
  Filled 2014-05-18: qty 1

## 2014-05-18 MED ORDER — PREDNISONE 10 MG PO TABS: 20.0000 mg | ORAL_TABLET | Freq: Every day | ORAL | Status: DC

## 2014-05-18 MED ORDER — PREDNISONE 20 MG PO TABS
20.0000 mg | ORAL_TABLET | Freq: Once | ORAL | Status: DC
  Filled 2014-05-18: qty 1

## 2014-05-18 MED ORDER — OXYCODONE-ACETAMINOPHEN 5-325 MG PO TABS: 1.0000 | ORAL_TABLET | ORAL | Status: DC | PRN

## 2014-05-18 NOTE — ED Notes (Signed)
Pt states she has been taking prednisone since 7/2,  Not helping with her pain.

## 2014-05-18 NOTE — ED Notes (Signed)
Pt to room 1 in w/c, able to walk from w/c to stretcher with quick steady gait. Pt reports pain to her right upper leg, radiating to right buttock x 6 months, worsening x Sunday.

## 2014-05-18 NOTE — ED Provider Notes (Signed)
CSN: [id number]     Arrival date & time 05/18/14  1035 History   First MD Initiated Contact with Patient 05/18/14 1056     Chief Complaint  Patient presents with  . Leg Pain     (Consider location/radiation/quality/duration/timing/severity/associated sxs/prior Treatment) HPI  78 year old female presents today complaining of right hip and back pain. She states that she has had this pain for several months but it has worsened over the past several days. She has been seen by her primary care physician and sent to a specialist. She states however that the specialist only for an x-Adora Yeh of her knee. Now she is having pain that radiates from her low back down to the buttocks and is walking with an antalgic gait stating that she cannot straighten up. She denies that she has numbness, tingling, or weakness in the right lower extremity. She has not had any loss of bowel or bladder control. She denies any previous history of back problems. She has not had any recent injuries. She has been taking acetaminophen without relief. Past Medical History  Diagnosis Date  . Pneumonia   . COPD (chronic obstructive pulmonary disease)   . Hypertension   . DVT (deep venous thrombosis) 1980s    unsure which leg  . GERD (gastroesophageal reflux disease)   . Lactose intolerance   . Spinal stenosis    Past Surgical History  Procedure Laterality Date  . Foot surgery    . Cholecystectomy    . Tonsillectomy    . Tubal ligation     Family History  Problem Relation Age of Onset  . Lupus Sister   . COPD Father   . Heart attack Son    History  Substance Use Topics  . Smoking status: Former Smoker -- 1.50 packs/day for 56 years    Types: Cigarettes    Quit date: 04/29/2012  . Smokeless tobacco: Never Used  . Alcohol Use: No     Comment: quit drinking beer 2005   OB History   Grav Para Term Preterm Abortions TAB SAB Ect Mult Living                 Review of Systems  All other systems reviewed and are  negative.     Allergies  Tramadol; Amlodipine; Aspirin; Codeine; Doxycycline; Hydrocodone; Losartan; Propoxyphene; and Tiotropium  Home Medications   Prior to Admission medications   Medication Sig Start Date End Date Taking? Authorizing Provider  acetaminophen (TYLENOL) 325 MG tablet Take 2 tablets (650 mg total) by mouth every 6 (six) hours as needed. 01/19/14   Carmin Muskrat, MD  albuterol (PROVENTIL HFA;VENTOLIN HFA) 108 (90 BASE) MCG/ACT inhaler Inhale 2 puffs into the lungs every 6 (six) hours as needed for wheezing or shortness of breath. 05/01/14   Elsie Stain, MD  albuterol (PROVENTIL) (2.5 MG/3ML) 0.083% nebulizer solution Take 2.5 mg by nebulization every 6 (six) hours as needed for shortness of breath. And as needed 11/28/13   Elsie Stain, MD  arformoterol (BROVANA) 15 MCG/2ML NEBU Take 2 mLs (15 mcg total) by nebulization 2 (two) times daily. 03/06/14   Elsie Stain, MD  budesonide (PULMICORT) 0.25 MG/2ML nebulizer solution Take 0.25 mg by nebulization daily.    Historical Provider, MD  fluticasone (FLONASE) 50 MCG/ACT nasal spray Place 2 sprays into both nostrils as needed.     Historical Provider, MD  hydrochlorothiazide (MICROZIDE) 12.5 MG capsule Take 12.5 mg by mouth. Once daily 03/08/14 03/08/15  Historical Provider, MD  Loperamide HCl ([facility] ANTI-DIARRHEA PO) Take by mouth as needed.    Historical Provider, MD  LYRICA 25 MG capsule Take 1 capsule by mouth. 1-2 times daily    Historical Provider, MD  predniSONE (DELTASONE) 10 MG tablet Take 4 for three days 3 for three days 2 for three days then one daily 03/30/14   Elsie Stain, MD  ranitidine (ZANTAC) 300 MG tablet Take 300 mg by mouth. 1 tablet at bedtime 03/20/14 03/20/15  Historical Provider, MD  verapamil (VERELAN PM) 240 MG 24 hr capsule Take 240 mg by mouth daily.    Historical Provider, MD   BP 116/95  Pulse 86  Temp(Src) 98.3 F (36.8 C) (Oral)  Resp 16  SpO2 100% Physical Exam  Nursing note and vitals  reviewed. Constitutional: She is oriented to person, place, and time. She appears well-developed and well-nourished.  HENT:  Head: Normocephalic and atraumatic.  Right Ear: Tympanic membrane and external ear normal.  Left Ear: Tympanic membrane and external ear normal.  Nose: Nose normal. Right sinus exhibits no maxillary sinus tenderness and no frontal sinus tenderness. Left sinus exhibits no maxillary sinus tenderness and no frontal sinus tenderness.  Eyes: Conjunctivae and EOM are normal. Pupils are equal, round, and reactive to light. Right eye exhibits no nystagmus. Left eye exhibits no nystagmus.  Neck: Normal range of motion. Neck supple.  Cardiovascular: Normal rate, regular rhythm, normal heart sounds and intact distal pulses.   Pulmonary/Chest: Effort normal and breath sounds normal. No respiratory distress. She exhibits no tenderness.  Abdominal: Soft. Bowel sounds are normal. She exhibits no distension and no mass. There is no tenderness.  Musculoskeletal: Normal range of motion. She exhibits no edema and no tenderness.  Neurological: She is alert and oriented to person, place, and time. She has normal strength and normal reflexes. No sensory deficit. She displays a negative Romberg sign. GCS eye subscore is 4. GCS verbal subscore is 5. GCS motor subscore is 6.  Reflex Scores:      Tricep reflexes are 2+ on the right side and 2+ on the left side.      Bicep reflexes are 2+ on the right side and 2+ on the left side.      Brachioradialis reflexes are 2+ on the right side and 2+ on the left side.      Patellar reflexes are 2+ on the right side and 2+ on the left side.      Achilles reflexes are 2+ on the right side and 2+ on the left side. Patient has a normal gait but is somewhat bent over. Speech is normal without dysarthria, dysphasia, or aphasia. Muscle strength is 5/5 in bilateral shoulders, elbow flexor and extensors, wrist flexor and extensors, and intrinsic hand muscles. 5/5  bilateral lower extremity hip flexors, extensors, knee flexors and extensors, and ankle dorsi and plantar flexors.    Skin: Skin is warm and dry. No rash noted.  Psychiatric: She has a normal mood and affect. Her behavior is normal. Judgment and thought content normal.    ED Course  Procedures (including critical care time) Labs Review Labs Reviewed - No data to display  Imaging Review Dg Lumbar Spine Complete  05/18/2014   CLINICAL DATA:  Six-month history of low back pain radiating down the right leg now with increasing symptoms with weight-bearing  EXAM: LUMBAR SPINE - COMPLETE 4+ VIEW  COMPARISON:  Coronal images of the lumbar spine from a abdominal/pelvic CT study of March 18, 2007  FINDINGS:  There is mild levoscoliosis of the lumbar spine which has become slightly more conspicuous since the previous study. There is degenerative disc space narrowing at all lumbar levels. There is facet joint hypertrophy bilaterally from L3-4 through L5-S1. There is no spondylolisthesis. The pedicles and transverse processes are intact where visualized. The observed portions of the sacrum are intact. There are soft tissue calcifications in the paraspinous regions bilaterally.  IMPRESSION: There is degenerative disc and facet joint change throughout the lumbar spine. There is no compression fracture. There is mild levoscoliosis which has become more conspicuous since the previous study.   Electronically Signed   By: David  Martinique   On: 05/18/2014 11:37   Dg Hip Complete Right  05/18/2014   CLINICAL DATA:  Six-month history of right hip pain which has recently worsened ; no known injury  EXAM: RIGHT HIP - COMPLETE 2+ VIEW  COMPARISON:  Right femur of August 17, 2013  FINDINGS: The bony pelvis is osteopenic. There is no lytic or blastic lesion. The hip joint spaces are reasonably well maintained. AP and frog-leg lateral views of the right hip reveal no more than minimal narrowing of the joint space. There is no  fracture or dislocation. The soft tissues of the pelvis exhibit no acute abnormalities. There is degenerative disc change at L4-5.  IMPRESSION: There is very mild degenerative change of the right hip. There is no acute bony abnormality.   Electronically Signed   By: David  Martinique   On: 05/18/2014 11:34     EKG Interpretation None      MDM   Final diagnoses:  Sciatica, right    Patient feels improved after Percocet . She is about being able to attend a family event this weekend. I discussed the patient and her family return precautions including worsened neurological deficits. She is placed on prednisone and is given a prescription for Percocet. She voices understanding regarding return precautions and need for close followup.    Shaune Pollack, MD 05/18/14 [phone]

## 2014-05-18 NOTE — Discharge Instructions (Signed)
Sciatica Sciatica is pain, weakness, numbness, or tingling along the path of the sciatic nerve. The nerve starts in the lower back and runs down the back of each leg. The nerve controls the muscles in the lower leg and in the back of the knee, while also providing sensation to the back of the thigh, lower leg, and the sole of your foot. Sciatica is a symptom of another medical condition. For instance, nerve damage or certain conditions, such as a herniated disk or bone spur on the spine, pinch or put pressure on the sciatic nerve. This causes the pain, weakness, or other sensations normally associated with sciatica. Generally, sciatica only affects one side of the body. CAUSES   Herniated or slipped disc.  Degenerative disk disease.  A pain disorder involving the narrow muscle in the buttocks (piriformis syndrome).  Pelvic injury or fracture.  Pregnancy.  Tumor (rare). SYMPTOMS  Symptoms can vary from mild to very severe. The symptoms usually travel from the low back to the buttocks and down the back of the leg. Symptoms can include:  Mild tingling or dull aches in the lower back, leg, or hip.  Numbness in the back of the calf or sole of the foot.  Burning sensations in the lower back, leg, or hip.  Sharp pains in the lower back, leg, or hip.  Leg weakness.  Severe back pain inhibiting movement. These symptoms may get worse with coughing, sneezing, laughing, or prolonged sitting or standing. Also, being overweight may worsen symptoms. DIAGNOSIS  Your caregiver will perform a physical exam to look for common symptoms of sciatica. He or she may ask you to do certain movements or activities that would trigger sciatic nerve pain. Other tests may be performed to find the cause of the sciatica. These may include:  Blood tests.  X-rays.  Imaging tests, such as an MRI or CT scan. TREATMENT  Treatment is directed at the cause of the sciatic pain. Sometimes, treatment is not necessary  and the pain and discomfort goes away on its own. If treatment is needed, your caregiver may suggest:  Over-the-counter medicines to relieve pain.  Prescription medicines, such as anti-inflammatory medicine, muscle relaxants, or narcotics.  Applying heat or ice to the painful area.  Steroid injections to lessen pain, irritation, and inflammation around the nerve.  Reducing activity during periods of pain.  Exercising and stretching to strengthen your abdomen and improve flexibility of your spine. Your caregiver may suggest losing weight if the extra weight makes the back pain worse.  Physical therapy.  Surgery to eliminate what is pressing or pinching the nerve, such as a bone spur or part of a herniated disk. HOME CARE INSTRUCTIONS   Only take over-the-counter or prescription medicines for pain or discomfort as directed by your caregiver.  Apply ice to the affected area for 20 minutes, 3-4 times a day for the first 48-72 hours. Then try heat in the same way.  Exercise, stretch, or perform your usual activities if these do not aggravate your pain.  Attend physical therapy sessions as directed by your caregiver.  Keep all follow-up appointments as directed by your caregiver.  Do not wear high heels or shoes that do not provide proper support.  Check your mattress to see if it is too soft. A firm mattress may lessen your pain and discomfort. SEEK IMMEDIATE MEDICAL CARE IF:   You lose control of your bowel or bladder (incontinence).  You have increasing weakness in the lower back, pelvis, buttocks,   or legs.  You have redness or swelling of your back.  You have a burning sensation when you urinate.  You have pain that gets worse when you lie down or awakens you at night.  Your pain is worse than you have experienced in the past.  Your pain is lasting longer than 4 weeks.  You are suddenly losing weight without reason. MAKE SURE YOU:  Understand these  instructions.  Will watch your condition.  Will get help right away if you are not doing well or get worse. Document Released: 09/09/2001 Document Revised: 03/16/2012 Document Reviewed: 01/25/2012 Exit[employer] Patient Information 2015 [employer]. This information is not intended to replace advice given to you by your health care provider. Make sure you discuss any questions you have with your health care provider.  

## 2014-05-22 DIAGNOSIS — M5430 Sciatica, unspecified side: Secondary | ICD-10-CM | POA: Insufficient documentation

## 2014-06-07 ENCOUNTER — Ambulatory Visit: Payer: Medicare Other | Admitting: Family

## 2014-06-08 ENCOUNTER — Encounter: Payer: Self-pay | Admitting: Critical Care Medicine

## 2014-06-08 ENCOUNTER — Ambulatory Visit (INDEPENDENT_AMBULATORY_CARE_PROVIDER_SITE_OTHER): Payer: Medicare Other | Admitting: Critical Care Medicine

## 2014-06-08 VITALS — BP 126/74 | HR 74 | Ht 64.0 in | Wt 113.0 lb

## 2014-06-08 DIAGNOSIS — J432 Centrilobular emphysema: Secondary | ICD-10-CM

## 2014-06-08 DIAGNOSIS — I82409 Acute embolism and thrombosis of unspecified deep veins of unspecified lower extremity: Secondary | ICD-10-CM

## 2014-06-08 DIAGNOSIS — I82401 Acute embolism and thrombosis of unspecified deep veins of right lower extremity: Secondary | ICD-10-CM

## 2014-06-08 DIAGNOSIS — J438 Other emphysema: Secondary | ICD-10-CM

## 2014-06-08 DIAGNOSIS — Z86718 Personal history of other venous thrombosis and embolism: Secondary | ICD-10-CM | POA: Insufficient documentation

## 2014-06-08 NOTE — Assessment & Plan Note (Signed)
History of acute deep venous thrombosis now on xarelto Plan per primary care

## 2014-06-08 NOTE — Patient Instructions (Signed)
You declined a flu vaccine Stop prednisone  Stay on nebulizer as prescribed Return 4 months

## 2014-06-08 NOTE — Progress Notes (Signed)
Subjective:    Patient ID: GIDGET QUIZHPI, female    DOB: 21-Aug-1936, 78 y.o.   MRN: [id number]  HPI  78 y.o. F with Gold C Copd primary emphysema  06/08/2014 Chief Complaint  Patient presents with  . Follow-up    Pt states she has a blood clot in R leg, is now on xarelto.  Pt c/o SOB with exertion, no other complaints at this time.   Blood clot in R leg with pain.  Now on Xarelto. Pain in R leg better on anticoagulants.   Dyspnea is better.  No real cough. No chest pain.  No real wheeze.  Now at [facility] .  Dx DVT at [facility]  Review of Systems  Constitutional:   No  weight loss, night sweats,  Fevers, chills, fatigue, or  lassitude.  HEENT:   No headaches,  Difficulty swallowing,  Tooth/dental problems, or  Sore throat,                No sneezing, itching, ear ache, nasal congestion, post nasal drip,   CV:  No chest pain,  Orthopnea, PND, swelling in lower extremities, anasarca, dizziness, palpitations, syncope.   GI  Notes heartburn, no indigestion, abdominal pain, nausea, vomiting, diarrhea, change in bowel habits, loss of appetite, bloody stools.   Resp:  No excess mucus, no productive cough,  Notes non-productive cough,  No coughing up of blood.  No change in color of mucus.  No wheezing.  No chest wall deformity  Skin: no rash or lesions.  GU: no dysuria, change in color of urine, no urgency or frequency.  No flank pain, no hematuria   MS:  No joint pain or swelling.  No decreased range of motion.  No back pain.  Psych:  No change in mood or affect. No depression or anxiety.  No memory loss.      Objective:   Physical Exam  BP 126/74  Pulse 74  Ht 5\' 4"  (1.626 m)  Wt 113 lb (51.256 kg)  BMI 19.39 kg/m2  SpO2 96%  GEN: A/Ox3; pleasant , NAD, well nourished   HEENT:  [place]/AT,  EACs-clear, TMs-wnl, NOSE-clear, THROAT-clear, no lesions, nasal inflammation with mild purulence  NECK:  Supple w/ fair ROM; no JVD; normal carotid impulses w/o bruits;  no thyromegaly or nodules palpated; no lymphadenopathy.  RESP  Diminished in bases w/ no wheezing or rhonchi.no accessory muscle use, no dullness to percussion  CARD:  RRR, no m/r/g  , no peripheral edema, pulses intact, no cyanosis or clubbing.  GI:   Soft & nt; nml bowel sounds; no organomegaly or masses detected.  Musco: Warm bil, no deformities or joint swelling noted.   Neuro: alert, no focal deficits noted.    Skin: Warm, no lesions or rashes  With ambulation on RA desats to 86%     Assessment & Plan:   DVT (deep venous thrombosis) History of acute deep venous thrombosis now on xarelto Plan per primary care  COPD with emphysema Gold C Gold stage C. COPD stable at this time Plan Stop prednisone  Stay on nebulizer as prescribed Return 4 months    note physical exam also is consistent with mild acute sinusitis [facility] will cover this as well  Updated Medication List Outpatient Encounter Prescriptions as of 06/08/2014  Medication Sig  . acetaminophen (TYLENOL) 325 MG tablet Take 2 tablets (650 mg total) by mouth every 6 (six) hours as needed.  [name] albuterol (PROVENTIL HFA;VENTOLIN HFA)  108 (90 BASE) MCG/ACT inhaler Inhale 2 puffs into the lungs every 6 (six) hours as needed for wheezing or shortness of breath.  [name] albuterol (PROVENTIL) (2.5 MG/3ML) 0.083% nebulizer solution Take 2.5 mg by nebulization every 6 (six) hours as needed for shortness of breath. And as needed  . budesonide (PULMICORT) 0.25 MG/2ML nebulizer solution Take 0.25 mg by nebulization daily.  . fluticasone (FLONASE) 50 MCG/ACT nasal spray Place 2 sprays into both nostrils as needed.   . hydrochlorothiazide (MICROZIDE) 12.5 MG capsule Take 12.5 mg by mouth. Once daily  . Loperamide HCl ([facility] ANTI-DIARRHEA PO) Take by mouth as needed.  [name] LYRICA 25 MG capsule Take 1 capsule by mouth. 1-2 times daily  . oxyCODONE-acetaminophen (PERCOCET/ROXICET) 5-325 MG per tablet Take 1 tablet by mouth every 4 (four)  hours as needed for severe pain.  . ranitidine (ZANTAC) 300 MG tablet Take 300 mg by mouth. 1 tablet at bedtime  . Rivaroxaban (XARELTO) 15 MG TABS tablet Take 15 mg by mouth 2 (two) times daily with a meal.  . verapamil (VERELAN PM) 240 MG 24 hr capsule Take 240 mg by mouth daily.  . [DISCONTINUED] predniSONE (DELTASONE) 10 MG tablet Take 2 tablets (20 mg total) by mouth daily.  . [DISCONTINUED] predniSONE (DELTASONE) 10 MG tablet Take 10 mg by mouth every other day.  . [DISCONTINUED] arformoterol (BROVANA) 15 MCG/2ML NEBU Take 2 mLs (15 mcg total) by nebulization 2 (two) times daily.  . [DISCONTINUED] predniSONE (DELTASONE) 10 MG tablet Take 4 for three days 3 for three days 2 for three days then one daily

## 2014-06-08 NOTE — Assessment & Plan Note (Signed)
Gold stage C. COPD stable at this time Plan Stop prednisone  Stay on nebulizer as prescribed Return 4 months

## 2014-06-13 ENCOUNTER — Telehealth: Payer: Self-pay | Admitting: Critical Care Medicine

## 2014-06-13 NOTE — Telephone Encounter (Signed)
Spoke with the pt  PW sees her for COPD w/ Emphysema and hx of DVT Last visit here on 06/08/14 PW stopped her pred  She started having increased SOB on 06/10/14 and went to UC  She states that she was checked for clots and this was ruled out  She was prescribed prednisone, but refuses to start until PW says it is okay  She is still having increased SOB  I advised will check with doc on call  SN, please advise, thanks!

## 2014-06-13 NOTE — Telephone Encounter (Signed)
Per SN---  Sounds like she needs to get back on it and wean more slowly off of it.   Or she can wait for PW to return to the office.

## 2014-06-13 NOTE — Telephone Encounter (Signed)
Spoke with the pt and notified of recs per SN She verbalized understanding  Pt will call with update next wk

## 2014-06-13 NOTE — Telephone Encounter (Signed)
Called spoke with patient and discussed SN's recommendations.  Pt stated that the prednisone she has at home is from her previous taper (10mg  tablets - about 30tabs total, some halves) but at her most recent ov w/ PW she was instructed to stop the prednisone.  She is good to restart as recommended by SN but questions dosage/instructions.  Would like to know if 10mg  daily would be appropriate.  Dr Lenna Gilford please advise, thank you.  Per 9.10.15 ov w/ PW: Patient Instructions      You declined a flu vaccine Stop prednisone   Stay on nebulizer as prescribed Return 4 months

## 2014-06-13 NOTE — Telephone Encounter (Signed)
Per SN---  Start 10 mg  BID x 3 days Then 10 mg  Daily  Call Dr. Joya Gaskins with progress report next week.  thanks

## 2014-06-20 ENCOUNTER — Telehealth: Payer: Self-pay | Admitting: Internal Medicine

## 2014-06-20 ENCOUNTER — Ambulatory Visit (INDEPENDENT_AMBULATORY_CARE_PROVIDER_SITE_OTHER): Payer: Medicare Other | Admitting: Adult Health

## 2014-06-20 ENCOUNTER — Encounter: Payer: Self-pay | Admitting: Adult Health

## 2014-06-20 ENCOUNTER — Telehealth: Payer: Self-pay | Admitting: Critical Care Medicine

## 2014-06-20 VITALS — BP 140/76 | HR 79 | Temp 98.6°F | Ht 65.0 in | Wt 115.6 lb

## 2014-06-20 DIAGNOSIS — J438 Other emphysema: Secondary | ICD-10-CM

## 2014-06-20 DIAGNOSIS — I82401 Acute embolism and thrombosis of unspecified deep veins of right lower extremity: Secondary | ICD-10-CM

## 2014-06-20 DIAGNOSIS — J432 Centrilobular emphysema: Secondary | ICD-10-CM

## 2014-06-20 DIAGNOSIS — I82409 Acute embolism and thrombosis of unspecified deep veins of unspecified lower extremity: Secondary | ICD-10-CM

## 2014-06-20 NOTE — Patient Instructions (Signed)
Begin INCRUSE 1 puff daily  Finish Prednisone taper as directed.  Continue on Xarelto 15mg  Twice daily then begin 20mg  daily on 06/22/14.  Follow up Dr. Joya Gaskins  In 3-4 weeks and As needed   Please contact office for sooner follow up if symptoms do not improve or worsen or seek emergency care

## 2014-06-20 NOTE — Telephone Encounter (Signed)
Spoke with the pt  She states that her breathing is no better since she called on 9/15 and we gave pred taper  She states that she is now "breaking out in sweats" OV with TP at 3 pm today-pt aware [place] office

## 2014-06-20 NOTE — Telephone Encounter (Signed)
Duplicate msg.

## 2014-06-22 MED ORDER — UMECLIDINIUM BROMIDE 62.5 MCG/INH IN AEPB: 1.0000 | INHALATION_SPRAY | Freq: Every day | RESPIRATORY_TRACT | Status: DC

## 2014-06-22 NOTE — Assessment & Plan Note (Addendum)
Severe COPD symptomatic only on pulmicort nebs Add LAMA - if unable to tolerated , restart ipratropium nebs  On xarelto for DVT with no evidence of PE on recent CT  CT chest neg for PE or acute process on 06/12/14    Plan  Begin INCRUSE 1 puff daily  Finish Prednisone taper as directed.  Continue on Xarelto 15mg  Twice daily then begin 20mg  daily on 06/22/14.  Follow up Dr. Joya Gaskins  In 3-4 weeks and As needed   Please contact office for sooner follow up if symptoms do not improve or worsen or seek emergency care

## 2014-06-22 NOTE — Assessment & Plan Note (Signed)
Cont on xarelto Has rx to transition from 20mg  to 15mg 

## 2014-06-22 NOTE — Progress Notes (Signed)
Subjective:    Patient ID: Holly Page, female    DOB: August 25, 1936, 78 y.o.   MRN: [id number]  HPI  78 y.o. F with Gold C Copd primary emphysema  06/08/2014 Chief Complaint  Patient presents with  . Follow-up    Pt states she has a blood clot in R leg, is now on xarelto.  Pt c/o SOB with exertion, no other complaints at this time.   Blood clot in R leg with pain.  Now on Xarelto. Pain in R leg better on anticoagulants.   Dyspnea is better.  No real cough. No chest pain.  No real wheeze.  Now at [facility] .  Dx D[facility] at [facility] >>no changes   06/20/14 Acute OV  Complains over last 2 weeks breathing is not doing as weel with increased dyspnea and  sweats x2 weeks.  No chest pain, jaw pain, syncope or feve.r  Denies any wheezing, tightness, cough, purulent sputum production.  Seen by PCP , 06/12/14 CT angio done that was neg for PE, showed COPD changes and chronic scarring with no acute process.  Currently on pulmicort Twice daily  . Previous intolerant to spiriva in past. (lactose)  Off  ipratropum neb ? Reasone.   Review of Systems  Constitutional:   No  weight loss, night sweats,  Fevers, chills, + fatigue, or  lassitude.  HEENT:   No headaches,  Difficulty swallowing,  Tooth/dental problems, or  Sore throat,                No sneezing, itching, ear ache,  {+nasal congestion, post nasal drip,   CV:  No chest pain,  Orthopnea, PND, swelling in lower extremities, anasarca, dizziness, palpitations, syncope.   GI  Notes heartburn, no indigestion, abdominal pain, nausea, vomiting, diarrhea, change in bowel habits, loss of appetite, bloody stools.   Resp:  No excess mucus, no productive cough,  Notes non-productive cough,  No coughing up of blood.  No change in color of mucus.  No wheezing.  No chest wall deformity  Skin: no rash or lesions.  GU: no dysuria, change in color of urine, no urgency or frequency.  No flank pain, no hematuria   MS:  No joint pain or  swelling.  No decreased range of motion.  No back pain.  Psych:  No change in mood or affect. No depression or anxiety.  No memory loss.      Objective:   Physical Exam  BP 140/76  Pulse 79  Temp(Src) 98.6 F (37 C) (Oral)  Ht 5\' 5"  (1.651 m)  Wt 115 lb 9.6 oz (52.436 kg)  BMI 19.24 kg/m2  SpO2 97%  GEN: A/Ox3; pleasant , NAD, well nourished   HEENT:  [place]/AT,  EACs-clear, TMs-wnl, NOSE-clear, THROAT-clear, no lesions, nasal inflammation with mild purulence  NECK:  Supple w/ fair ROM; no JVD; normal carotid impulses w/o bruits; no thyromegaly or nodules palpated; no lymphadenopathy.  RESP  Diminished in bases w/ no wheezing or rhonchi.no accessory muscle use, no dullness to percussion  CARD:  RRR, no m/r/g  , no peripheral edema, pulses intact, no cyanosis or clubbing.  GI:   Soft & nt; nml bowel sounds; no organomegaly or masses detected.  Musco: Warm bil, no deformities or joint swelling noted.   Neuro: alert, no focal deficits noted.    Skin: Warm, no lesions or rashes        Assessment & Plan:   COPD with emphysema Gold C  Flare   Plan  Begin INCRUSE 1 puff daily  Finish Prednisone taper as directed.  Continue on Xarelto 15mg  Twice daily then begin 20mg  daily on 06/22/14.  Follow up Dr. Joya Gaskins  In 3-4 weeks and As needed   Please contact office for sooner follow up if symptoms do not improve or worsen or seek emergency care      note physical exam also is consistent with mild acute sinusitis [facility] will cover this as well  Updated Medication List Outpatient Encounter Prescriptions as of 06/20/2014  Medication Sig  . acetaminophen (TYLENOL) 325 MG tablet Take 2 tablets (650 mg total) by mouth every 6 (six) hours as needed.  [name] albuterol (PROVENTIL HFA;VENTOLIN HFA) 108 (90 BASE) MCG/ACT inhaler Inhale 2 puffs into the lungs every 6 (six) hours as needed for wheezing or shortness of breath.  [name] albuterol (PROVENTIL) (2.5 MG/3ML) 0.083% nebulizer solution  Take 2.5 mg by nebulization every 6 (six) hours as needed for shortness of breath. And as needed  . budesonide (PULMICORT) 0.25 MG/2ML nebulizer solution Take 0.25 mg by nebulization daily.  . fluticasone (FLONASE) 50 MCG/ACT nasal spray Place 2 sprays into both nostrils as needed.   . hydrochlorothiazide (MICROZIDE) 12.5 MG capsule Take 12.5 mg by mouth. Once daily  . Loperamide HCl ([employer] ANTI-DIARRHEA PO) Take by mouth as needed.  [name] LYRICA 25 MG capsule Take 1 capsule by mouth. 1-2 times daily  . oxyCODONE-acetaminophen (PERCOCET/ROXICET) 5-325 MG per tablet Take 1 tablet by mouth every 4 (four) hours as needed for severe pain.  . ranitidine (ZANTAC) 300 MG tablet Take 300 mg by mouth. 1 tablet at bedtime  . Rivaroxaban (XARELTO) 15 MG TABS tablet Take 15 mg by mouth 2 (two) times daily with a meal.  . verapamil (VERELAN PM) 240 MG 24 hr capsule Take 240 mg by mouth daily.

## 2014-06-26 ENCOUNTER — Telehealth: Payer: Self-pay | Admitting: Critical Care Medicine

## 2014-06-26 MED ORDER — AZITHROMYCIN 250 MG PO TABS: 250.0000 mg | ORAL_TABLET | ORAL | Status: DC

## 2014-06-26 NOTE — Telephone Encounter (Signed)
Spoke with the pt and notified of recs per TP  Pt verbalized understanding  Nothing further needed Rx was sent to pharm

## 2014-06-26 NOTE — Telephone Encounter (Signed)
Per TP: for thick mucus take zpak #1 take as directed, mucinex DM twice daily as needed for cough/congestion, plenty of fluids and rest.  If no better in a few days, call the office for appt.  If symptoms worsen, need to go to the ER.  Thanks.

## 2014-06-26 NOTE — Addendum Note (Signed)
Addended by: Rosana Berger on: 06/26/2014 05:37 PM   Modules accepted: Orders

## 2014-06-26 NOTE — Telephone Encounter (Signed)
Pt c/o increased SOB, cough, wheezing. Pt states that she gets "strangled", no mucus production.  Pt feels her breathing is worse since last OV. Seen by TP 06/20/14-- Given Incruse Elipta, taking as directed-- no relief/no improvement. Pt is out of Incruse inhaler-- last dose was today.  Patient Instructions     Begin INCRUSE 1 puff daily  Finish Prednisone taper as directed.  Continue on Xarelto 15mg  Twice daily then begin 20mg  daily on 06/22/14.  Follow up Dr. Joya Gaskins In 3-4 weeks and As needed  Please contact office for sooner follow up if symptoms do not improve or worsen or seek emergency care    Offered appt with TP tomorrow--refused stating she was just here. Would like to know if something could be called in for her.  Allergies  Allergen Reactions  . Tramadol Shortness Of Breath  . Amlodipine     Other reaction(s): SWELLING  . Aspirin     Other reaction(s): PALPITATIONS Other reaction(s): DIFFICULTY BREATHING Heart flutter  . Codeine Other (See Comments)    hallucinations   . Doxycycline     Other reaction(s): NAUSEA,VOMITING  . Hydrocodone     hallucinations hallucinations  . Losartan   . Propoxyphene     Other reaction(s): NAUSEA  . Tiotropium    Please advise [name]. Thanks.

## 2014-06-27 ENCOUNTER — Telehealth: Payer: Self-pay | Admitting: Critical Care Medicine

## 2014-06-27 NOTE — Telephone Encounter (Signed)
Called and spoke with pt and she is aware of what was told to her yesterday.  Take the mucinex for the cough and congestion.  Pt is aware and nothing further is needed.

## 2014-07-03 ENCOUNTER — Ambulatory Visit: Payer: Medicare Other | Admitting: Neurology

## 2014-07-03 ENCOUNTER — Telehealth: Payer: Self-pay | Admitting: Critical Care Medicine

## 2014-07-03 MED ORDER — PREDNISONE 10 MG PO TABS: ORAL_TABLET | ORAL | Status: DC

## 2014-07-03 NOTE — Telephone Encounter (Signed)
Spoke with pt and advised of PW's recommendations.  Pt states she is already using Albuterol hhn qid and I advised her to continue this.  Also instructed to increase Prednisone to 20mg  daily.  OV with TP scheduled.

## 2014-07-03 NOTE — Telephone Encounter (Signed)
Use albuterol 4 x daily on schedule Increase prednisone to 20mg  daily  Needs another OV , TP this week

## 2014-07-03 NOTE — Telephone Encounter (Signed)
Pt still c/o sob, wheezing and chest tightness.  Denies fever or cough.  Still on Prednisone 10mg  daily.  Finished sample of Incruse given by TP but states it didn't make a whole lot of difference. PT using Pulmicort HHN once daily and Albuterol HHN prn.  Also on Xarelto 20 mg daily . PT not sure what else to do.  Please advise.

## 2014-07-04 ENCOUNTER — Encounter: Payer: Self-pay | Admitting: Adult Health

## 2014-07-04 ENCOUNTER — Ambulatory Visit (INDEPENDENT_AMBULATORY_CARE_PROVIDER_SITE_OTHER)
Admission: RE | Admit: 2014-07-04 | Discharge: 2014-07-04 | Disposition: A | Discharge status: Home / Self Care | Payer: Medicare Other | Source: Ambulatory Visit | Attending: Adult Health | Admitting: Adult Health

## 2014-07-04 ENCOUNTER — Ambulatory Visit (INDEPENDENT_AMBULATORY_CARE_PROVIDER_SITE_OTHER): Payer: Medicare Other | Admitting: Adult Health

## 2014-07-04 ENCOUNTER — Other Ambulatory Visit (INDEPENDENT_AMBULATORY_CARE_PROVIDER_SITE_OTHER): Payer: Medicare Other

## 2014-07-04 VITALS — BP 98/70 | HR 74 | Temp 98.7°F | Ht 65.0 in | Wt 115.8 lb

## 2014-07-04 DIAGNOSIS — J439 Emphysema, unspecified: Secondary | ICD-10-CM

## 2014-07-04 DIAGNOSIS — I82401 Acute embolism and thrombosis of unspecified deep veins of right lower extremity: Secondary | ICD-10-CM

## 2014-07-04 DIAGNOSIS — R0600 Dyspnea, unspecified: Secondary | ICD-10-CM

## 2014-07-04 LAB — BASIC METABOLIC PANEL
BUN: 16 mg/dL (ref 6–23)
CO2: 33 mEq/L — ABNORMAL HIGH (ref 19–32)
CREATININE: 0.8 mg/dL (ref 0.4–1.2)
Calcium: 9.7 mg/dL (ref 8.4–10.5)
Chloride: 102 mEq/L (ref 96–112)
GFR: 90.98 mL/min (ref >60.00)
Glucose, Bld: 114 mg/dL — ABNORMAL HIGH (ref 70–99)
POTASSIUM: 3.8 meq/L (ref 3.5–5.1)
Sodium: 142 mEq/L (ref 135–145)

## 2014-07-04 LAB — CBC WITH DIFFERENTIAL/PLATELET
BASOS ABS: 0 10*3/uL (ref 0.0–0.1)
Basophils Relative: 0.2 % (ref 0.0–3.0)
EOS ABS: 0 10*3/uL (ref 0.0–0.7)
Eosinophils Relative: 0.4 % (ref 0.0–5.0)
HCT: 41.3 % (ref 36.0–46.0)
Hemoglobin: 13 g/dL (ref 12.0–15.0)
LYMPHS PCT: 13.6 % (ref 12.0–46.0)
Lymphs Abs: 1.1 10*3/uL (ref 0.7–4.0)
MCHC: 31.5 g/dL (ref 30.0–36.0)
MCV: 72.6 fl — ABNORMAL LOW (ref 78.0–100.0)
Monocytes Absolute: 0.2 10*3/uL (ref 0.1–1.0)
Monocytes Relative: 2.7 % — ABNORMAL LOW (ref 3.0–12.0)
NEUTROS PCT: 83.1 % — AB (ref 43.0–77.0)
Neutro Abs: 6.7 10*3/uL (ref 1.4–7.7)
Platelets: 284 10*3/uL (ref 150.0–400.0)
RBC: 5.69 Mil/uL — ABNORMAL HIGH (ref 3.87–5.11)
RDW: 17 % — AB (ref 11.5–15.5)
WBC: 8.1 10*3/uL (ref 4.0–10.5)

## 2014-07-04 LAB — BRAIN NATRIURETIC PEPTIDE: Pro B Natriuretic peptide (BNP): 19 pg/mL (ref 0.0–100.0)

## 2014-07-04 NOTE — Patient Instructions (Addendum)
Continue INCRUSE 1 puff daily  Continue on Prednisone 20mg  daily for 2 weeks then 10mg  daily -hold at this dose until seen back in office  Continue on Xarelto 20mg  daily  Follow up Dr. Joya Gaskins  In 3 weeks and As needed   If symptoms return or worsen please call back sooner.  Please contact office for sooner follow up if symptoms do not improve or worsen or seek emergency care

## 2014-07-05 NOTE — Progress Notes (Signed)
Subjective:    Patient ID: Holly Page, female    DOB: [birth year], 78 y.o.   MRN: [id number]  HPI  78 y.o. F with Gold C Copd primary emphysema  06/08/2014 Chief Complaint  Patient presents with  . Follow-up    Pt states she has a blood clot in R leg, is now on xarelto.  Pt c/o SOB with exertion, no other complaints at this time.   Blood clot in R leg with pain.  Now on Xarelto. Pain in R leg better on anticoagulants.   Dyspnea is better.  No real cough. No chest pain.  No real wheeze.  Now at [facility] .  [facility] D[facility] at [facility] >>no changes   06/20/14 Acute OV  Complains over last 2 weeks breathing is not doing as weel with increased dyspnea and  sweats x2 weeks.  No chest pain, jaw pain, syncope or feve.r  Denies any wheezing, tightness, cough, purulent sputum production.  Seen by PCP , 06/12/14 CT angio done that was neg for PE, showed COPD changes and chronic scarring with no acute process.  Currently on pulmicort Twice daily  . Previous intolerant to spiriva in past. (lactose)  Off  ipratropum neb ? Reasone. >>Incruse rx   07/04/14  Acute OV  Returns complaining of persistent symptoms of cough, dry , wheezing and tightness. Was seen 2-3 weeks ago for similar symptoms . She was seen by PCP and underwent CT chest that was neg for PE(recnet DVT dx on Xarelto)  She says she takes Xarelto everyday-no missed doses.  She was recently tapered off steroids and believes this is when her symptoms started. She was given pred taper which helped some but symptoms started coming back when she got down to 10mg  . She called in to our office on 10/5 and prednisone was increased 20mg  daily and instructed to use Albuterol neb more often. Says overall today feels much better able to walk without cough or wheezing. Less dyspnea. She denies chest pain, jaw pain or arm pain at rest or with walking .  Says her PCP did an EKG when this started and was reported neg. Says she has seen Cardiology  in recent past w/ neg stress test.  Denies fever, discolored mucus, orthopnea or edema.  Can not feel if Incruse inhaler has helped or not.  CXR today with no acute process' Labs were unrevealing with nml bnp.     Review of Systems  Constitutional:   No  weight loss, night sweats,  Fevers, chills, + fatigue, or  lassitude.  HEENT:   No headaches,  Difficulty swallowing,  Tooth/dental problems, or  Sore throat,                No sneezing, itching, ear ache, nasal congestion, post nasal drip,   CV:  No chest pain,  Orthopnea, PND, swelling in lower extremities, anasarca, dizziness, palpitations, syncope.   GI  Notes heartburn, no indigestion, abdominal pain, nausea, vomiting, diarrhea, change in bowel habits, loss of appetite, bloody stools.   Resp:  No excess mucus, no productive cough,  Notes non-productive cough,  No coughing up of blood.  No change in color of mucus.  No wheezing.  No chest wall deformity  Skin: no rash or lesions.  GU: no dysuria, change in color of urine, no urgency or frequency.  No flank pain, no hematuria   MS:  No joint pain or swelling.  No decreased range of motion.  No  back pain.  Psych:  No change in mood or affect. No depression or anxiety.  No memory loss.      Objective:   Physical Exam  BP 98/70  Pulse 74  Temp(Src) 98.7 F (37.1 C) (Oral)  Ht 5\' 5"  (1.651 m)  Wt 115 lb 12.8 oz (52.527 kg)  BMI 19.27 kg/m2  SpO2 94%  GEN: A/Ox3; pleasant , NAD, well nourished   HEENT:  [place]/AT,  EACs-clear, TMs-wnl, NOSE-clear, THROAT-clear, no lesions, nasal inflammation with mild purulence  NECK:  Supple w/ fair ROM; no JVD; normal carotid impulses w/o bruits; no thyromegaly or nodules palpated; no lymphadenopathy.  RESP  Diminished in bases w/ no wheezing or rhonchi.no accessory muscle use, no dullness to percussion  CARD:  RRR, no m/r/g  , no peripheral edema, pulses intact, no cyanosis or clubbing.  GI:   Soft & nt; nml bowel sounds; no  organomegaly or masses detected.  Musco: Warm bil, no deformities or joint swelling noted.   Neuro: alert, no focal deficits noted.    Skin: Warm, no lesions or rashes  CXR 07/04/14 The lungs are markedly hyperaerated consistent with emphysema.  Bullous changes are present within the mid and upper lung fields. No  focal infiltrate or effusion is seen. Mediastinal and hilar contours  are unremarkable and the heart is within normal limits in size. No  bony abnormality is seen.   Recent Labs Lab 07/04/14 1230  HGB 13.0  HCT 41.3  WBC 8.1  PLT 284.0     Recent Labs Lab 07/04/14 1230  PROBNP 19.0     Recent Labs Lab 07/04/14 1230  NA 142  K 3.8  CL 102  CO2 33*  GLUCOSE 114*  BUN 16  CREATININE 0.8  CALCIUM 9.7         Assessment & Plan:

## 2014-07-05 NOTE — Assessment & Plan Note (Signed)
Recent slow to resolve flare improving with steroid challenge cxr neg for acute process  Recent CT chest neg for acute process or PE  Labs unrevealing w/ nml bnp  If not improving or continues to be symptoms can consider repeat CT CHest angio however low suspcion as recent CT neg and on Xarelto    Plan  Continue INCRUSE 1 puff daily  Continue on Prednisone 20mg  daily for 2 weeks then 10mg  daily -hold at this dose until seen back in office  Continue on Xarelto 20mg  daily on 06/22/14.  Follow up Dr. Joya Gaskins  In 3 weeks and As needed   If symptoms return or worsen please call back sooner.  Please contact office for sooner follow up if symptoms do not improve or worsen or seek emergency care

## 2014-07-05 NOTE — Assessment & Plan Note (Signed)
Continue on Xarelto 20mg  daily  Follow up Dr. Joya Gaskins  In 3 weeks and As needed   If symptoms return or worsen please call back sooner.  Please contact office for sooner follow up if symptoms do not improve or worsen or seek emergency care

## 2014-07-07 NOTE — Progress Notes (Signed)
Quick Note:  LMOM TCB x1. ______ 

## 2014-07-07 NOTE — Progress Notes (Signed)
Quick Note:  Called spoke with patient, advised of lab results / recs as stated by TP. Pt verbalized her understanding and denied any questions. ______ 

## 2014-07-13 ENCOUNTER — Encounter: Payer: Self-pay | Admitting: Critical Care Medicine

## 2014-07-13 ENCOUNTER — Ambulatory Visit (INDEPENDENT_AMBULATORY_CARE_PROVIDER_SITE_OTHER): Payer: Medicare Other | Admitting: Critical Care Medicine

## 2014-07-13 VITALS — BP 150/84 | HR 74 | Temp 97.7°F | Ht 65.0 in | Wt 110.0 lb

## 2014-07-13 DIAGNOSIS — J432 Centrilobular emphysema: Secondary | ICD-10-CM

## 2014-07-13 MED ORDER — UMECLIDINIUM-VILANTEROL 62.5-25 MCG/INH IN AEPB: 1.0000 | INHALATION_SPRAY | Freq: Every day | RESPIRATORY_TRACT | Status: DC

## 2014-07-13 NOTE — Patient Instructions (Signed)
We will try to access [facility] one puff daily for you, use samples Stop Incruse while on ANORO No other changes You refused a flu vaccine Return 3 months

## 2014-07-13 NOTE — Progress Notes (Signed)
Subjective:    Patient ID: Holly Page, female    DOB: 01/15/1936, 78 y.o.   MRN: [id number]  HPI  78 y.o. F with Gold C Copd primary emphysema  07/13/2014 Chief Complaint  Patient presents with  . 1 wk follow up    o2 sat 88% RA upon arrival to exam room.  DOE unchanged.  Has a pulling/deep aching sensation in left chest.  Wheezing has improved.     Pt has seen NP twice in three weeks. First Rx Azithromycin and pred pulse , second time more pred on 07/04/14.   CXR was neg. Labs unremarkable. No real cough, feels throat is scratchy.  Wheezing is better. Dyspnea with exertion.  Uses oxygen at home and qhs. Pt with pulling sensation. Recent stress test: [facility]  Is on Incruse alone, occ three times daily albuterol in neb med  Review of Systems  Constitutional:   No  weight loss, night sweats,  Fevers, chills, + fatigue, or  lassitude.  HEENT:   No headaches,  Difficulty swallowing,  Tooth/dental problems, or  Sore throat,                No sneezing, itching, ear ache, nasal congestion, post nasal drip,   CV:  No chest pain,  Orthopnea, PND, swelling in lower extremities, anasarca, dizziness, palpitations, syncope.   GI  Notes heartburn, no indigestion, abdominal pain, nausea, vomiting, diarrhea, change in bowel habits, loss of appetite, bloody stools.   Resp:  No excess mucus, no productive cough,  Notes non-productive cough,  No coughing up of blood.  No change in color of mucus.  No wheezing.  No chest wall deformity  Skin: no rash or lesions.  GU: no dysuria, change in color of urine, no urgency or frequency.  No flank pain, no hematuria   MS:  No joint pain or swelling.  No decreased range of motion.  No back pain.  Psych:  No change in mood or affect. No depression or anxiety.  No memory loss.      Objective:   Physical Exam  BP 150/84  Pulse 74  Temp(Src) 97.7 F (36.5 C) (Oral)  Ht 5\' 5"  (1.651 m)  Wt 110 lb (49.896 kg)  BMI 18.31 kg/m2   SpO2 97%  GEN: A/Ox3; pleasant , NAD, well nourished   HEENT:  [place]/AT,  EACs-clear, TMs-wnl, NOSE-clear, THROAT-clear, no lesions, nasal inflammation with mild purulence  NECK:  Supple w/ fair ROM; no JVD; normal carotid impulses w/o bruits; no thyromegaly or nodules palpated; no lymphadenopathy.  RESP  Diminished in bases w/ no wheezing or rhonchi.no accessory muscle use, no dullness to percussion  CARD:  RRR, no m/r/g  , no peripheral edema, pulses intact, no cyanosis or clubbing.  GI:   Soft & nt; nml bowel sounds; no organomegaly or masses detected.  Musco: Warm bil, no deformities or joint swelling noted.   Neuro: alert, no focal deficits noted.    Skin: Warm, no lesions or rashes       Assessment & Plan:   COPD with emphysema Gold C Gold stage C. COPD with poor response to long-acting anticholinergic alone Plan Change long-acting anticholinergic combination long acting anticholinergic plus long acting beta agonist with anoro Continue budesonide by nebulizer [place]   Updated Medication List Outpatient Encounter Prescriptions as of 07/13/2014  Medication Sig  . acetaminophen (TYLENOL) 325 MG tablet Take 2 tablets (650 mg total) by mouth every 6 (six) hours as needed.  [name]  albuterol (PROVENTIL HFA;VENTOLIN HFA) 108 (90 BASE) MCG/ACT inhaler Inhale 2 puffs into the lungs every 6 (six) hours as needed for wheezing or shortness of breath.  [name] albuterol (PROVENTIL) (2.5 MG/3ML) 0.083% nebulizer solution Take 2.5 mg by nebulization every 6 (six) hours as needed for shortness of breath. And as needed  . budesonide (PULMICORT) 0.25 MG/2ML nebulizer solution Take 0.25 mg by nebulization daily.  . fluticasone (FLONASE) 50 MCG/ACT nasal spray Place 2 sprays into both nostrils as needed.   . hydrochlorothiazide (MICROZIDE) 12.5 MG capsule Take 12.5 mg by mouth. Once daily  . Loperamide HCl ([employer] ANTI-DIARRHEA PO) Take by mouth as needed.  [name] LYRICA 25 MG capsule Take 1 capsule by mouth.  1-2 times daily  . predniSONE (DELTASONE) 10 MG tablet Take 2 tablets daily as directed  . ranitidine (ZANTAC) 300 MG tablet Take 300 mg by mouth. 1 tablet at bedtime  . rivaroxaban (XARELTO) 20 MG TABS tablet Take 20 mg by mouth daily with supper.  . verapamil (VERELAN PM) 240 MG 24 hr capsule Take 240 mg by mouth daily.  . [DISCONTINUED] Umeclidinium Bromide (INCRUSE ELLIPTA) 62.5 MCG/INH AEPB Inhale 1 puff into the lungs daily.  [name] Umeclidinium-Vilanterol (ANORO ELLIPTA) 62.5-25 MCG/INH AEPB Inhale 1 puff into the lungs daily.  [name] Umeclidinium-Vilanterol (ANORO ELLIPTA) 62.5-25 MCG/INH AEPB Inhale 1 puff into the lungs daily.  . [DISCONTINUED] oxyCODONE-acetaminophen (PERCOCET/ROXICET) 5-325 MG per tablet Take 1 tablet by mouth every 4 (four) hours as needed for severe pain.

## 2014-07-14 NOTE — Assessment & Plan Note (Signed)
Gold stage C. COPD with poor response to long-acting anticholinergic alone Plan Change long-acting anticholinergic combination long acting anticholinergic plus long acting beta agonist with anoro Continue budesonide by nebulizer [place]

## 2014-07-17 ENCOUNTER — Telehealth: Payer: Self-pay | Admitting: Critical Care Medicine

## 2014-07-17 NOTE — Telephone Encounter (Signed)
Called and spoke to pt. Pt stated she needed the rx and pt assistance forms faxed to [place] pt assistance. Advised pt that we need to have the assistance forms to fax. Pt stated she cannot come to [facility] so she will mail the forms to our office. Pt stated she made copies. Address to the office was given to pt. Pt stated she will mail the forms today. Pt stated she had enough samples.  Pt last seen on 07/13/14 by PW.   Will forward to Crystal to follow.

## 2014-07-19 ENCOUNTER — Telehealth: Payer: Self-pay | Admitting: Critical Care Medicine

## 2014-07-19 NOTE — Telephone Encounter (Signed)
Spoke with the pt  She states that she is needing to know how to take her prednisone  She is taking 10 mg daily and has 3 tablets remaining  She was seen on 07/13/14 by PW and there was no mention on how to continue  PW off today and tomorrow  Will forward to TP for recs since pt only has 3 tablets left  Please advise, thanks!

## 2014-07-19 NOTE — Telephone Encounter (Signed)
lmtcb for pt.  

## 2014-07-20 ENCOUNTER — Ambulatory Visit: Payer: Medicare Other | Admitting: Neurology

## 2014-07-20 MED ORDER — PREDNISONE 10 MG PO TABS: ORAL_TABLET | ORAL | Status: DC

## 2014-07-20 NOTE — Telephone Encounter (Signed)
Take Prednisone 10mg  daily for 1 more week then 10mg  1/2 daily for 1 week then stop  Can send Prednisone 10mg  #14  Keep follow up with Dr. Joya Gaskins   Please contact office for sooner follow up if symptoms do not improve or worsen or seek emergency care

## 2014-07-20 NOTE — Telephone Encounter (Signed)
Spoke with pt and advised on instructions for Prednisone.  Refill sent.

## 2014-07-24 ENCOUNTER — Emergency Department (HOSPITAL_BASED_OUTPATIENT_CLINIC_OR_DEPARTMENT_OTHER)
Admission: EM | Admit: 2014-07-24 | Discharge: 2014-07-25 | Disposition: A | Discharge status: Home / Self Care | Payer: Medicare Other | Attending: Emergency Medicine | Admitting: Emergency Medicine

## 2014-07-24 ENCOUNTER — Encounter (HOSPITAL_BASED_OUTPATIENT_CLINIC_OR_DEPARTMENT_OTHER): Payer: Self-pay | Admitting: Emergency Medicine

## 2014-07-24 DIAGNOSIS — Z7952 Long term (current) use of systemic steroids: Secondary | ICD-10-CM | POA: Insufficient documentation

## 2014-07-24 DIAGNOSIS — J449 Chronic obstructive pulmonary disease, unspecified: Secondary | ICD-10-CM | POA: Diagnosis not present

## 2014-07-24 DIAGNOSIS — K219 Gastro-esophageal reflux disease without esophagitis: Secondary | ICD-10-CM | POA: Diagnosis not present

## 2014-07-24 DIAGNOSIS — I10 Essential (primary) hypertension: Secondary | ICD-10-CM | POA: Insufficient documentation

## 2014-07-24 DIAGNOSIS — Z8701 Personal history of pneumonia (recurrent): Secondary | ICD-10-CM | POA: Diagnosis not present

## 2014-07-24 DIAGNOSIS — R319 Hematuria, unspecified: Secondary | ICD-10-CM | POA: Insufficient documentation

## 2014-07-24 DIAGNOSIS — Z87891 Personal history of nicotine dependence: Secondary | ICD-10-CM | POA: Diagnosis not present

## 2014-07-24 DIAGNOSIS — Z79899 Other long term (current) drug therapy: Secondary | ICD-10-CM | POA: Insufficient documentation

## 2014-07-24 DIAGNOSIS — Z86718 Personal history of other venous thrombosis and embolism: Secondary | ICD-10-CM | POA: Insufficient documentation

## 2014-07-24 DIAGNOSIS — Z7951 Long term (current) use of inhaled steroids: Secondary | ICD-10-CM | POA: Insufficient documentation

## 2014-07-24 NOTE — ED Notes (Signed)
Pt attempted to provide urine specimen unable to at this time.

## 2014-07-24 NOTE — ED Provider Notes (Signed)
CSN: [id number]     Arrival date & time 07/24/14  2201 History  This chart was scribed for Holly Mires, MD by Molli Posey, ED Scribe. This patient was seen in room MH03/MH03 and the patient's care was started 11:48 PM.    Chief Complaint  Patient presents with  . Hematuria    The history is provided by the patient. No language interpreter was used.   HPI Comments: Holly Page is a 78 y.o. female who presents to the Emergency Department complaining of hematuria that started today. Is able to void normally and feels she is able to empty bladder. No abd or flank pain.  She reports she had a 'chronic' DVT in her right leg and was placed on initially xarelto 15 mg po bid, and now is on 20mg  Xarelto once daily since September 4th, 2015. Pt reports she had a DVT in the 1980s. No increased leg swelling or pain. No chest pain or sob. No other abn bruising nor bleeding. No blood in stool. No vaginal discharge or bleeding.  Denies dysuria or flank pain. No fever or chills. States otherwise feels well. No other asa or anticoag use.        Past Medical History  Diagnosis Date  . Pneumonia   . COPD (chronic obstructive pulmonary disease)   . Hypertension   . DVT (deep venous thrombosis) 1980s, recurrent 2015    Right DVT 2015  on xarelto  . GERD (gastroesophageal reflux disease)   . Lactose intolerance   . Spinal stenosis    Past Surgical History  Procedure Laterality Date  . Foot surgery    . Cholecystectomy    . Tonsillectomy    . Tubal ligation     Family History  Problem Relation Age of Onset  . Lupus Sister   . COPD Father   . Heart attack Son    History  Substance Use Topics  . Smoking status: Former Smoker -- 1.50 packs/day for 56 years    Types: Cigarettes    Quit date: 04/29/2012  . Smokeless tobacco: Never Used  . Alcohol Use: No     Comment: quit drinking beer 2005   OB History   Grav Para Term Preterm Abortions TAB SAB Ect Mult Living                  Review of Systems  Constitutional: Negative for fever and chills.  HENT: Negative for nosebleeds.   Respiratory: Negative for shortness of breath.   Cardiovascular: Negative for chest pain and leg swelling.  Gastrointestinal: Negative for blood in stool.  Endocrine: Negative for polyuria.  Genitourinary: Positive for hematuria. Negative for vaginal bleeding.  Musculoskeletal: Negative for back pain and neck pain.  Skin: Negative for rash.  Neurological: Negative for syncope, light-headedness and headaches.  Hematological: Does not bruise/bleed easily.  All other systems reviewed and are negative.     Allergies  Tramadol; Amlodipine; Aspirin; Codeine; Doxycycline; Hydrocodone; Losartan; Propoxyphene; and Tiotropium  Home Medications   Prior to Admission medications   Medication Sig Start Date End Date Taking? Authorizing Provider  acetaminophen (TYLENOL) 325 MG tablet Take 2 tablets (650 mg total) by mouth every 6 (six) hours as needed. 01/19/14   Carmin Muskrat, MD  albuterol (PROVENTIL HFA;VENTOLIN HFA) 108 (90 BASE) MCG/ACT inhaler Inhale 2 puffs into the lungs every 6 (six) hours as needed for wheezing or shortness of breath. 05/01/14   Elsie Stain, MD  albuterol (PROVENTIL) (2.5 MG/3ML) 0.083% nebulizer  solution Take 2.5 mg by nebulization every 6 (six) hours as needed for shortness of breath. And as needed 11/28/13   Elsie Stain, MD  budesonide (PULMICORT) 0.25 MG/2ML nebulizer solution Take 0.25 mg by nebulization daily.    Historical Provider, MD  fluticasone (FLONASE) 50 MCG/ACT nasal spray Place 2 sprays into both nostrils as needed.     Historical Provider, MD  hydrochlorothiazide (MICROZIDE) 12.5 MG capsule Take 12.5 mg by mouth. Once daily 03/08/14 03/08/15  Historical Provider, MD  Loperamide HCl ([facility] ANTI-DIARRHEA PO) Take by mouth as needed.    Historical Provider, MD  LYRICA 25 MG capsule Take 1 capsule by mouth. 1-2 times daily    Historical Provider, MD   predniSONE (DELTASONE) 10 MG tablet Take 1 tablet daily for 1 week then 1/2 tablet daily for 1 week then stop. 07/20/14   Elsie Stain, MD  ranitidine (ZANTAC) 300 MG tablet Take 300 mg by mouth. 1 tablet at bedtime 03/20/14 03/20/15  Historical Provider, MD  rivaroxaban (XARELTO) 20 MG TABS tablet Take 20 mg by mouth daily with supper.    Historical Provider, MD  Umeclidinium-Vilanterol (ANORO ELLIPTA) 62.5-25 MCG/INH AEPB Inhale 1 puff into the lungs daily. 07/13/14   Elsie Stain, MD  Umeclidinium-Vilanterol (ANORO ELLIPTA) 62.5-25 MCG/INH AEPB Inhale 1 puff into the lungs daily. 07/13/14   Elsie Stain, MD  verapamil (VERELAN PM) 240 MG 24 hr capsule Take 240 mg by mouth daily.    Historical Provider, MD   BP 160/82  Pulse 86  Temp(Src) 98.2 F (36.8 C) (Oral)  Resp 18  Ht 5\' 5"  (1.651 m)  Wt 114 lb (51.71 kg)  BMI 18.97 kg/m2  SpO2 98% Physical Exam  Nursing note and vitals reviewed. Constitutional: She is oriented to person, place, and time. She appears well-developed and well-nourished.  HENT:  Head: Normocephalic and atraumatic.  Mouth/Throat: Oropharynx is clear and moist.  Eyes: Conjunctivae and EOM are normal. Pupils are equal, round, and reactive to light. No scleral icterus.  Neck: Normal range of motion. Neck supple.  Cardiovascular: Normal rate, regular rhythm, normal heart sounds and intact distal pulses.  Exam reveals no gallop and no friction rub.   No murmur heard. Pulmonary/Chest: Effort normal and breath sounds normal. No respiratory distress.  Abdominal: Soft. Bowel sounds are normal. She exhibits no distension and no mass. There is no tenderness. There is no rebound and no guarding.  Genitourinary:  No cva tenderness. No ext/vaginal bleeding or blood at urethral meatus. Light brown stool, heme neg  Musculoskeletal: Normal range of motion. She exhibits no edema and no tenderness.  No appreciable leg swelling. Distal pulses palp.   Neurological: She is  alert and oriented to person, place, and time.  Skin: Skin is warm and dry. She is not diaphoretic.  Psychiatric: She has a normal mood and affect. Her behavior is normal.    ED Course  Procedures  DIAGNOSTIC STUDIES: Oxygen Saturation is 98% on RA, normal by my interpretation.    COORDINATION OF CARE: 11:53 PM Discussed treatment plan with pt at bedside and pt agreed to plan. Results for orders placed during the hospital encounter of 07/24/14  URINALYSIS, ROUTINE W REFLEX MICROSCOPIC      Result Value Ref Range   Color, Urine RED (*) YELLOW   APPearance TURBID (*) CLEAR   Specific Gravity, Urine 1.025  1.005 - 1.030   pH 6.0  5.0 - 8.0   Glucose, UA NEGATIVE  NEGATIVE mg/dL  Hgb urine dipstick LARGE (*) NEGATIVE   Bilirubin Urine NEGATIVE  NEGATIVE   Ketones, ur 15 (*) NEGATIVE mg/dL   Protein, ur 30 (*) NEGATIVE mg/dL   Urobilinogen, UA 0.2  0.0 - 1.0 mg/dL   Nitrite NEGATIVE  NEGATIVE   Leukocytes, UA SMALL (*) NEGATIVE  CBC      Result Value Ref Range   WBC 8.8  4.0 - 10.5 K/uL   RBC 5.48 (*) 3.87 - 5.11 MIL/uL   Hemoglobin 12.6  12.0 - 15.0 g/dL   HCT 38.9  36.0 - 46.0 %   MCV 71.0 (*) 78.0 - 100.0 fL   MCH 23.0 (*) 26.0 - 34.0 pg   MCHC 32.4  30.0 - 36.0 g/dL   RDW 17.0 (*) 11.5 - 15.5 %   Platelets 369  150 - 400 K/uL  OCCULT BLOOD X 1 CARD TO LAB, STOOL      Result Value Ref Range   Fecal Occult Bld NEGATIVE  NEGATIVE  URINE MICROSCOPIC-ADD ON      Result Value Ref Range   Squamous Epithelial / LPF FEW (*) RARE   WBC, UA 0-2  <3 WBC/hpf   RBC / HPF TOO NUMEROUS TO COUNT  <3 RBC/hpf   Bacteria, UA FEW (*) RARE   Crystals CA OXALATE CRYSTALS (*) NEGATIVE   Dg Chest 2 View  07/04/2014   CLINICAL DATA:  Shortness of breath, worsening over the last 3 weeks, former smoking history  EXAM: CHEST  2 VIEW  COMPARISON:  Chest x-ray of 03/30/2014  FINDINGS: The lungs are markedly hyperaerated consistent with emphysema. Bullous changes are present within the mid and  upper lung fields. No focal infiltrate or effusion is seen. Mediastinal and hilar contours are unremarkable and the heart is within normal limits in size. No bony abnormality is seen.  IMPRESSION: Emphysema.  No active lung disease.   Electronically Signed   By: Ivar Drape M.D.   On: 07/04/2014 12:29       MDM   I personally performed the services described in this documentation, which was scribed in my presence. The recorded information has been reviewed and considered. Holly Mires, MD  Reviewed nursing notes and prior charts for additional history.   ua w blood and few bact.  Will culture and rx.  Discussed diff dx, hematuria due to early uti, hematuria related to blood thinner, hematuria related to gu tract pathology/ca, and need for close pcp follow up.  Pt is able to void, urine grossly w clear/mild hematuria. No abd pain or tenderness. hgb normal.   Pt appears stable for d/c.      Holly Mires, MD 07/25/14 0110

## 2014-07-24 NOTE — ED Notes (Signed)
Pt reports she had a blood clot in right leg and placed on xarelto.  Pt reports today noticed blood in her urine.  Reports feels as though her abdomen is swollen.

## 2014-07-25 LAB — URINE MICROSCOPIC-ADD ON

## 2014-07-25 LAB — OCCULT BLOOD X 1 CARD TO LAB, STOOL: Fecal Occult Bld: NEGATIVE

## 2014-07-25 LAB — CBC
HEMATOCRIT: 38.9 % (ref 36.0–46.0)
HEMOGLOBIN: 12.6 g/dL (ref 12.0–15.0)
MCH: 23 pg — ABNORMAL LOW (ref 26.0–34.0)
MCHC: 32.4 g/dL (ref 30.0–36.0)
MCV: 71 fL — AB (ref 78.0–100.0)
Platelets: 369 10*3/uL (ref 150–400)
RBC: 5.48 MIL/uL — AB (ref 3.87–5.11)
RDW: 17 % — ABNORMAL HIGH (ref 11.5–15.5)
WBC: 8.8 10*3/uL (ref 4.0–10.5)

## 2014-07-25 LAB — URINALYSIS, ROUTINE W REFLEX MICROSCOPIC
Bilirubin Urine: NEGATIVE
Glucose, UA: NEGATIVE mg/dL
Ketones, ur: 15 mg/dL — AB
Nitrite: NEGATIVE
PH: 6 (ref 5.0–8.0)
Protein, ur: 30 mg/dL — AB
Specific Gravity, Urine: 1.025 (ref 1.005–1.030)
Urobilinogen, UA: 0.2 mg/dL (ref 0.0–1.0)

## 2014-07-25 MED ORDER — CEPHALEXIN 250 MG PO CAPS
500.0000 mg | ORAL_CAPSULE | Freq: Once | ORAL | Status: AC
  Administered 2014-07-25: 500 mg via ORAL
  Filled 2014-07-25: qty 2

## 2014-07-25 MED ORDER — CEPHALEXIN 500 MG PO CAPS: 500.0000 mg | ORAL_CAPSULE | Freq: Three times a day (TID) | ORAL | Status: DC

## 2014-07-25 NOTE — Discharge Instructions (Signed)
It was our pleasure to provide your ER care today - we hope that you feel better.  Hold/do not take your dose of xarelto tomorrow.  Take keflex (antibiotic) as prescribed. A urine culture was sent to test for urine infection - the results should be back in 2 days time - have your doctor follow up on that result then. Follow up with your doctor in the next 24 - 48 hours for recheck - have them follow up on urine culture result, recheck urine, and discuss resuming xarelto.   Return to ER if worse, new symptoms, vaginal bleeding, other abnormal bruising/bleeding, unable to void, severe abdominal pain, fevers, weak/faint, other concern.     Hematuria Hematuria is blood in your urine. It can be caused by a bladder infection, kidney infection, prostate infection, kidney stone, or cancer of your urinary tract. Infections can usually be treated with medicine, and a kidney stone usually will pass through your urine. If neither of these is the cause of your hematuria, further workup to find out the reason may be needed. It is very important that you tell your health care provider about any blood you see in your urine, even if the blood stops without treatment or happens without causing pain. Blood in your urine that happens and then stops and then happens again can be a symptom of a very serious condition. Also, pain is not a symptom in the initial stages of many urinary cancers. HOME CARE INSTRUCTIONS   Drink lots of fluid, 3-4 quarts a day. If you have been diagnosed with an infection, cranberry juice is especially recommended, in addition to large amounts of water.  Avoid caffeine, tea, and carbonated beverages because they tend to irritate the bladder.  Avoid alcohol because it may irritate the prostate.  Take all medicines as directed by your health care provider.  If you were prescribed an antibiotic medicine, finish it all even if you start to feel better.  If you have been diagnosed with a  kidney stone, follow your health care provider's instructions regarding straining your urine to catch the stone.  Empty your bladder often. Avoid holding urine for long periods of time.  After a bowel movement, women should cleanse front to back. Use each tissue only once.  Empty your bladder before and after sexual intercourse if you are a female. SEEK MEDICAL CARE IF:  You develop back pain.  You have a fever.  You have a feeling of sickness in your stomach (nausea) or vomiting.  Your symptoms are not better in 3 days. Return sooner if you are getting worse. SEEK IMMEDIATE MEDICAL CARE IF:   You develop severe vomiting and are unable to keep the medicine down.  You develop severe back or abdominal pain despite taking your medicines.  You begin passing a large amount of blood or clots in your urine.  You feel extremely weak or faint, or you pass out. MAKE SURE YOU:   Understand these instructions.  Will watch your condition.  Will get help right away if you are not doing well or get worse. Document Released: 09/15/2005 Document Revised: 01/30/2014 Document Reviewed: 05/16/2013 [facility] Patient Information 2015 [place], [place]. This information is not intended to replace advice given to you by your health care provider. Make sure you discuss any questions you have with your health care provider.

## 2014-07-25 NOTE — Telephone Encounter (Signed)
Crystal please advise on status of forms. Thanks.

## 2014-07-26 LAB — URINE CULTURE
CULTURE: NO GROWTH
Colony Count: NO GROWTH

## 2014-07-26 NOTE — Telephone Encounter (Signed)
Holly Page, any updates? Thanks.

## 2014-07-31 MED ORDER — UMECLIDINIUM-VILANTEROL 62.5-25 MCG/INH IN AEPB: 1.0000 | INHALATION_SPRAY | Freq: Every day | RESPIRATORY_TRACT | Status: DC

## 2014-07-31 NOTE — Telephone Encounter (Signed)
Crystal can we sign off of this message?  Any updates?

## 2014-07-31 NOTE — Telephone Encounter (Signed)
I haven't received these forms yet. Spoke with pt - states she mailed pt assistance forms for An[employer] on Wednesday evening.   Advised I have not yet received these but will continue to look out for them. Pt states she only have 4 days left of An[employer].  Requesting to pick up samples at [facility] office. Advised will not be back in [facility] until Thursday PM.  Pt ok with this. Anoro Samples given to [facility] nurse to take on Thursday.  Pt will also bring a copy of the Anoro Pt Assistance forms incase we still have no received these.

## 2014-08-01 NOTE — Telephone Encounter (Signed)
Pt would like an update.  Pt states she mailed the forms last week & would like to get this taken care of asap.  Pt can be reached at [phone].  Holly Page

## 2014-08-01 NOTE — Telephone Encounter (Signed)
Called and spoke with pt and she stated that she does not have a copy of these Pt assistance forms.  She stated that she mailed these to our office here and she does not understanding why we did not get these forms.  Crystal have you seen these come in yet?  Pt stated that she mailed these last week.  She will come by the [facility] office on Thursday to pick up the samples on Thursday and she will fill out another form for the PT assistance form.  Can we make sure that we send a copy of these forms with the nurse going to [facility] on Thursday.  thanks

## 2014-08-02 NOTE — Telephone Encounter (Signed)
I still have not received the application through the mail.   Called, spoke with pt.  Reports she realized she addressed the envelope with the wrong zip code. I placed another application in the bag with samples.  Pt aware and is aware this will be brought to [facility] office tomorrow after 1:30 pm.   She verbalized understanding and would like to complete application and leave documents while in office tomorrow. Anderson Malta will be going to [employer].  Will route msg to her as FYI.

## 2014-08-03 ENCOUNTER — Telehealth: Payer: Self-pay | Admitting: Critical Care Medicine

## 2014-08-03 NOTE — Telephone Encounter (Signed)
Called and spoke to pt. Pt stated that she mailed her completed assistance forms for [place]. Pt aware that we will call her with an update. Crystal have you seen these forms?

## 2014-08-04 MED ORDER — UMECLIDINIUM-VILANTEROL 62.5-25 MCG/INH IN AEPB: 1.0000 | INHALATION_SPRAY | Freq: Every day | RESPIRATORY_TRACT | Status: DC

## 2014-08-04 NOTE — Telephone Encounter (Signed)
Duplicate phone msg.  Please see 07/17/14 phone msg for additional information.

## 2014-08-04 NOTE — Telephone Encounter (Signed)
Still have not received mailed application.  Did Receive [employer] Pt assistance application for Anoro along with required documents. Printed Anoro rx -- will have Dr. Joya Gaskins sign on Monday when he returns to office. Pt aware.

## 2014-08-07 NOTE — Telephone Encounter (Signed)
Anoro rx signed by Dr. Joya Gaskins. It has been faxed along with pt assistance application and information provided by pt to [place]. Pt aware and is to call office back if she has not received response from company in the next 1-2 wks.

## 2014-08-08 ENCOUNTER — Other Ambulatory Visit: Payer: Self-pay | Admitting: Neurosurgery

## 2014-08-08 DIAGNOSIS — D329 Benign neoplasm of meninges, unspecified: Secondary | ICD-10-CM

## 2014-08-11 ENCOUNTER — Encounter: Payer: Self-pay | Admitting: Family

## 2014-08-11 ENCOUNTER — Ambulatory Visit (INDEPENDENT_AMBULATORY_CARE_PROVIDER_SITE_OTHER): Payer: Medicare Other | Admitting: Family

## 2014-08-11 VITALS — BP 140/60 | HR 73 | Temp 97.6°F | Resp 16 | Ht 65.25 in | Wt 114.2 lb

## 2014-08-11 DIAGNOSIS — K219 Gastro-esophageal reflux disease without esophagitis: Secondary | ICD-10-CM

## 2014-08-11 DIAGNOSIS — J432 Centrilobular emphysema: Secondary | ICD-10-CM

## 2014-08-11 DIAGNOSIS — I82401 Acute embolism and thrombosis of unspecified deep veins of right lower extremity: Secondary | ICD-10-CM

## 2014-08-11 DIAGNOSIS — I10 Essential (primary) hypertension: Secondary | ICD-10-CM

## 2014-08-11 DIAGNOSIS — R319 Hematuria, unspecified: Secondary | ICD-10-CM | POA: Insufficient documentation

## 2014-08-11 NOTE — Assessment & Plan Note (Signed)
Will refer to Urology for further evaluation, records requested.

## 2014-08-11 NOTE — Progress Notes (Signed)
Subjective:    Patient ID: Holly Page, female    DOB: 1935/10/18, 78 y.o.   MRN: [id number]  HPI  Holly Page is a 78 yr old female who presents today to establish care.  DVT-  Reports that she was diagnosed with DVT right posterior thigh 06/02/14. Reports that she continues to have right leg pain.  Reports that she developed hematuria- was seen in the ED on 10/26. Was told to hold xarelto x 24 hours.  She reports that she had a renal US which showed a "cyst on my kidney."  Denies gross hematuria since 10/26 but has been told by previous PCP that still has microscopic hematuria. Has not seen gross hematuria since that time. She has followed at [facility] for care until now. Was told that she has a cyst on her right kidney and non-obstructing stone.   Reports previous hx of dvt in the 1980's.   GERD- reports stable on ranitidine.  COPD- quit smoking 2 years ago, followed by Dr. Joya Gaskins.   HTN- she is maintained on verapamil and hctz.  Reports 833 systolic when she went to [facility] medical.   BP Readings from Last 3 Encounters:  08/11/14 140/60  07/25/14 147/83  07/13/14 150/84     Review of Systems See HPI  Past Medical History  Diagnosis Date  . Pneumonia   . COPD (chronic obstructive pulmonary disease)   . Hypertension   . DVT (deep venous thrombosis) 1980s, recurrent 2015    Right DVT 2015  on xarelto  . GERD (gastroesophageal reflux disease)   . Lactose intolerance   . Spinal stenosis     History   Social History  . Marital Status: Divorced    Spouse Name: N/A    Number of Children: 37  . Years of Education: college   Occupational History  . Retired    Social History Main Topics  . Smoking status: Former Smoker -- 1.50 packs/day for 56 years    Types: Cigarettes    Quit date: 04/29/2012  . Smokeless tobacco: Never Used  . Alcohol Use: No     Comment: quit drinking beer 2005  . Drug Use: No  . Sexual Activity: Not on file   Other Topics  Concern  . Not on file   Social History Narrative   Patient lives at home alone.   Caffeine Use: none   4 children (1 deceased)    Past Surgical History  Procedure Laterality Date  . Foot surgery    . Cholecystectomy    . Tonsillectomy    . Tubal ligation      Family History  Problem Relation Age of Onset  . Lupus Sister   . COPD Father   . Heart attack Son     Allergies  Allergen Reactions  . Tramadol Shortness Of Breath  . Amlodipine     Other reaction(s): SWELLING  . Aspirin     Other reaction(s): PALPITATIONS Other reaction(s): DIFFICULTY BREATHING Heart flutter  . Codeine Other (See Comments)    hallucinations   . Doxycycline     Other reaction(s): NAUSEA,VOMITING  . Hydrocodone     hallucinations hallucinations  . Losartan   . Propoxyphene     Other reaction(s): NAUSEA  . Tiotropium     Current Outpatient Prescriptions on File Prior to Visit  Medication Sig Dispense Refill  . acetaminophen (TYLENOL) 325 MG tablet Take 2 tablets (650 mg total) by mouth every 6 (six) hours as needed. 15  tablet 0  . albuterol (PROVENTIL HFA;VENTOLIN HFA) 108 (90 BASE) MCG/ACT inhaler Inhale 2 puffs into the lungs every 6 (six) hours as needed for wheezing or shortness of breath. 1 Inhaler 3  . albuterol (PROVENTIL) (2.5 MG/3ML) 0.083% nebulizer solution Take 2.5 mg by nebulization every 6 (six) hours as needed for shortness of breath. And as needed    . budesonide (PULMICORT) 0.25 MG/2ML nebulizer solution Take 0.25 mg by nebulization daily.    . fluticasone (FLONASE) 50 MCG/ACT nasal spray Place 2 sprays into both nostrils as needed.     . hydrochlorothiazide (MICROZIDE) 12.5 MG capsule Take 12.5 mg by mouth. Once daily    . Loperamide HCl ([facility]S ANTI-DIARRHEA PO) Take by mouth as needed.    [name] LYRICA 25 MG capsule Take 1 capsule by mouth. 1-2 times daily    . ranitidine (ZANTAC) 300 MG tablet Take 300 mg by mouth. 1 tablet at bedtime    . rivaroxaban (XARELTO) 20 MG TABS  tablet Take 20 mg by mouth daily with supper.    [name] Umeclidinium-Vilanterol (ANORO ELLIPTA) 62.5-25 MCG/INH AEPB Inhale 1 puff into the lungs daily. 3 each 0  . verapamil (VERELAN PM) 240 MG 24 hr capsule Take 240 mg by mouth daily.     No current facility-administered medications on file prior to visit.    BP 140/60 mmHg  Pulse 73  Temp(Src) 97.6 F (36.4 C) (Oral)  Resp 16  Ht 5' 5.25" (1.657 m)  Wt 114 lb 3.2 oz (51.801 kg)  BMI 18.87 kg/m2  SpO2 96%       Objective:   Physical Exam  Constitutional: She is oriented to person, place, and time. She appears well-developed and well-nourished. No distress.  HENT:  Head: Normocephalic and atraumatic.  Cardiovascular: Normal rate and regular rhythm.   No murmur heard. Pulmonary/Chest: Effort normal. No respiratory distress. She has wheezes. She has no rales. She exhibits no tenderness.  Musculoskeletal: She exhibits no edema.  Neurological: She is alert and oriented to person, place, and time.  Skin: Skin is warm and dry.  Psychiatric: She has a normal mood and affect. Her behavior is normal. Judgment and thought content normal.          Assessment & Plan:  30 minutes spent with pt today.  >50% of this time was spent counseling pt chronic anticoagulation/DVT and hematuria causes/work up.

## 2014-08-11 NOTE — Assessment & Plan Note (Signed)
Recurrent DVT. Refer to Hematology for evaluation of need for lifelong anticoagulation.  Continue xarelto for now. Samples provided along with a coupon for 30 days free.  May need transition to coumadin at some point as she is unable to afford xarelto.

## 2014-08-11 NOTE — Patient Instructions (Addendum)
Continue xarelto You will be contacted about your referral to Urology and hematology. Follow up in 2 months. Go to the ER if you develop recurrent bright red blood in your urine.  Welcome to [facility]!

## 2014-08-11 NOTE — Assessment & Plan Note (Signed)
Stable, management per pulmonology Joya Gaskins)

## 2014-08-11 NOTE — Progress Notes (Signed)
Pre visit review using our clinic review tool, if applicable. No additional management support is needed unless otherwise documented below in the visit note. 

## 2014-08-11 NOTE — Assessment & Plan Note (Signed)
Fair BP control- better at home per pt. Continue current meds. Monitor.

## 2014-08-11 NOTE — Assessment & Plan Note (Signed)
Stable on ranitidine.  

## 2014-08-13 ENCOUNTER — Encounter (HOSPITAL_BASED_OUTPATIENT_CLINIC_OR_DEPARTMENT_OTHER): Payer: Self-pay | Admitting: Emergency Medicine

## 2014-08-13 ENCOUNTER — Emergency Department (HOSPITAL_BASED_OUTPATIENT_CLINIC_OR_DEPARTMENT_OTHER)
Admission: EM | Admit: 2014-08-13 | Discharge: 2014-08-13 | Disposition: A | Discharge status: Home / Self Care | Payer: Medicare Other | Attending: Emergency Medicine | Admitting: Emergency Medicine

## 2014-08-13 ENCOUNTER — Emergency Department (HOSPITAL_BASED_OUTPATIENT_CLINIC_OR_DEPARTMENT_OTHER): Payer: Medicare Other

## 2014-08-13 DIAGNOSIS — R319 Hematuria, unspecified: Secondary | ICD-10-CM | POA: Diagnosis not present

## 2014-08-13 DIAGNOSIS — Z87891 Personal history of nicotine dependence: Secondary | ICD-10-CM | POA: Diagnosis not present

## 2014-08-13 DIAGNOSIS — K219 Gastro-esophageal reflux disease without esophagitis: Secondary | ICD-10-CM | POA: Insufficient documentation

## 2014-08-13 DIAGNOSIS — Z86718 Personal history of other venous thrombosis and embolism: Secondary | ICD-10-CM | POA: Insufficient documentation

## 2014-08-13 DIAGNOSIS — J449 Chronic obstructive pulmonary disease, unspecified: Secondary | ICD-10-CM | POA: Insufficient documentation

## 2014-08-13 DIAGNOSIS — Z86011 Personal history of benign neoplasm of the brain: Secondary | ICD-10-CM | POA: Diagnosis not present

## 2014-08-13 DIAGNOSIS — I10 Essential (primary) hypertension: Secondary | ICD-10-CM | POA: Diagnosis not present

## 2014-08-13 DIAGNOSIS — Z7901 Long term (current) use of anticoagulants: Secondary | ICD-10-CM | POA: Diagnosis not present

## 2014-08-13 DIAGNOSIS — Z8639 Personal history of other endocrine, nutritional and metabolic disease: Secondary | ICD-10-CM | POA: Insufficient documentation

## 2014-08-13 DIAGNOSIS — M545 Low back pain: Secondary | ICD-10-CM | POA: Diagnosis not present

## 2014-08-13 DIAGNOSIS — Z8701 Personal history of pneumonia (recurrent): Secondary | ICD-10-CM | POA: Diagnosis not present

## 2014-08-13 DIAGNOSIS — Z79899 Other long term (current) drug therapy: Secondary | ICD-10-CM | POA: Diagnosis not present

## 2014-08-13 LAB — URINE MICROSCOPIC-ADD ON

## 2014-08-13 LAB — URINALYSIS, ROUTINE W REFLEX MICROSCOPIC
Bilirubin Urine: NEGATIVE
GLUCOSE, UA: NEGATIVE mg/dL
KETONES UR: NEGATIVE mg/dL
Nitrite: NEGATIVE
PROTEIN: NEGATIVE mg/dL
Specific Gravity, Urine: 1.016 (ref 1.005–1.030)
Urobilinogen, UA: 0.2 mg/dL (ref 0.0–1.0)
pH: 6 (ref 5.0–8.0)

## 2014-08-13 LAB — CBC WITH DIFFERENTIAL/PLATELET
BASOS ABS: 0 10*3/uL (ref 0.0–0.1)
Basophils Relative: 0 % (ref 0–1)
EOS PCT: 2 % (ref 0–5)
Eosinophils Absolute: 0.1 10*3/uL (ref 0.0–0.7)
HEMATOCRIT: 37.8 % (ref 36.0–46.0)
Hemoglobin: 12.1 g/dL (ref 12.0–15.0)
Lymphocytes Relative: 42 % (ref 12–46)
Lymphs Abs: 2.5 10*3/uL (ref 0.7–4.0)
MCH: 22.8 pg — ABNORMAL LOW (ref 26.0–34.0)
MCHC: 32 g/dL (ref 30.0–36.0)
MCV: 71.3 fL — ABNORMAL LOW (ref 78.0–100.0)
MONOS PCT: 11 % (ref 3–12)
Monocytes Absolute: 0.7 10*3/uL (ref 0.1–1.0)
Neutro Abs: 2.7 10*3/uL (ref 1.7–7.7)
Neutrophils Relative %: 45 % (ref 43–77)
Platelets: 274 10*3/uL (ref 150–400)
RBC: 5.3 MIL/uL — ABNORMAL HIGH (ref 3.87–5.11)
RDW: 15.7 % — AB (ref 11.5–15.5)
WBC: 6 10*3/uL (ref 4.0–10.5)

## 2014-08-13 MED ORDER — DOCUSATE SODIUM 100 MG PO CAPS: 100.0000 mg | ORAL_CAPSULE | Freq: Two times a day (BID) | ORAL | Status: DC

## 2014-08-13 NOTE — Discharge Instructions (Signed)

## 2014-08-13 NOTE — ED Notes (Addendum)
Pt presents to ED with complaints of blood in urine that started today. Pt is currently taking xarelto. Pt also reports pain in RLQ

## 2014-08-13 NOTE — ED Provider Notes (Signed)
CSN: [id number]     Arrival date & time 08/13/14  1900 History  This chart was scribed for Holly Johns, MD by [name], ED Scribe. This patient was seen in room MH02/MH02 and the patient's care was started at 7:58 PM.   Chief Complaint  Patient presents with  . Hematuria   Patient is a 78 y.o. female presenting with hematuria. The history is provided by the patient. No language interpreter was used.  Hematuria Associated symptoms include abdominal pain. Pertinent negatives include no chest pain, no headaches and no shortness of breath.   HPI Comments: Holly Page is a 78 y.o. female with a history of spinal stenosis, COPD, GERD, who presents to the Emergency Department complaining of hematuria that began earlier today. Patient states that she is currently taking Xarelto for a chronic DVT. She also reports associated suprapubic discomfort which occasionally radiates to lower back which also began earlier today. Patient states that she was here 2 weeks ago with similar symptoms. She states her symptoms today are improved since last time. She is having no difficulty urinating or decreased urination.  She denies associated dysuria, fever, vomiting.  She has seen her PCP who is in the process of referring her to hematology to see if her Xarelto needs to be continued and to urology for the hematuria.  Past Medical History  Diagnosis Date  . Pneumonia   . COPD (chronic obstructive pulmonary disease)   . Hypertension   . DVT (deep venous thrombosis) 1980s, recurrent 2015    Right DVT 2015  on xarelto  . GERD (gastroesophageal reflux disease)   . Lactose intolerance   . Spinal stenosis    Past Surgical History  Procedure Laterality Date  . Foot surgery    . Cholecystectomy    . Tonsillectomy    . Tubal ligation     Family History  Problem Relation Age of Onset  . Lupus Sister   . COPD Father     smoker deceased.   [name] Heart attack Son    History  Substance Use Topics  .  Smoking status: Former Smoker -- 1.50 packs/day for 56 years    Types: Cigarettes    Quit date: 04/29/2012  . Smokeless tobacco: Never Used  . Alcohol Use: No     Comment: quit drinking beer 2005   OB History    No data available     Review of Systems  Constitutional: Negative for fever, chills, diaphoresis and fatigue.  HENT: Negative for congestion, rhinorrhea and sneezing.   Eyes: Negative.   Respiratory: Negative for cough, chest tightness and shortness of breath.   Cardiovascular: Negative for chest pain and leg swelling.  Gastrointestinal: Positive for abdominal pain. Negative for nausea, vomiting, diarrhea and blood in stool.  Genitourinary: Positive for hematuria. Negative for frequency, flank pain and difficulty urinating.  Musculoskeletal: Positive for back pain. Negative for arthralgias.  Skin: Negative for rash.  Neurological: Negative for dizziness, speech difficulty, weakness, numbness and headaches.   Allergies  Tramadol; Amlodipine; Aspirin; Codeine; Doxycycline; Hydrocodone; Losartan; Propoxyphene; and Tiotropium  Home Medications   Prior to Admission medications   Medication Sig Start Date End Date Taking? Authorizing Provider  acetaminophen (TYLENOL) 325 MG tablet Take 2 tablets (650 mg total) by mouth every 6 (six) hours as needed. 01/19/14   Carmin Muskrat, MD  albuterol (PROVENTIL HFA;VENTOLIN HFA) 108 (90 BASE) MCG/ACT inhaler Inhale 2 puffs into the lungs every 6 (six) hours as needed for wheezing or shortness  of breath. 05/01/14   Elsie Stain, MD  albuterol (PROVENTIL) (2.5 MG/3ML) 0.083% nebulizer solution Take 2.5 mg by nebulization every 6 (six) hours as needed for shortness of breath. And as needed 11/28/13   Elsie Stain, MD  budesonide (PULMICORT) 0.25 MG/2ML nebulizer solution Take 0.25 mg by nebulization daily.    Historical Provider, MD  Cholecalciferol (VITAMIN D-3) 5000 UNITS TABS Take 1 tablet by mouth daily.    Historical Provider, MD   docusate sodium (COLACE) 100 MG capsule Take 1 capsule (100 mg total) by mouth 2 (two) times daily. 08/13/14   Holly Johns, MD  fluticasone (FLONASE) 50 MCG/ACT nasal spray Place 2 sprays into both nostrils as needed.     Historical Provider, MD  hydrochlorothiazide (MICROZIDE) 12.5 MG capsule Take 12.5 mg by mouth. Once daily 03/08/14 03/08/15  Historical Provider, MD  Loperamide HCl ([facility] ANTI-DIARRHEA PO) Take by mouth as needed.    Historical Provider, MD  LYRICA 25 MG capsule Take 1 capsule by mouth. 1-2 times daily    Historical Provider, MD  ranitidine (ZANTAC) 300 MG tablet Take 300 mg by mouth. 1 tablet at bedtime 03/20/14 03/20/15  Historical Provider, MD  rivaroxaban (XARELTO) 20 MG TABS tablet Take 20 mg by mouth daily with supper.    Historical Provider, MD  Umeclidinium-Vilanterol (ANORO ELLIPTA) 62.5-25 MCG/INH AEPB Inhale 1 puff into the lungs daily. 07/31/14   Elsie Stain, MD  verapamil (VERELAN PM) 240 MG 24 hr capsule Take 240 mg by mouth daily.    Historical Provider, MD   Triage Vitals: BP 142/74 mmHg  Pulse 62  Temp(Src) 97.9 F (36.6 C) (Oral)  Resp 18  Ht 5\' 5"  (1.651 m)  Wt 114 lb (51.71 kg)  BMI 18.97 kg/m2  SpO2 94%  Physical Exam  Constitutional: She is oriented to person, place, and time. She appears well-developed and well-nourished.  HENT:  Head: Normocephalic and atraumatic.  Eyes: Pupils are equal, round, and reactive to light.  Neck: Normal range of motion. Neck supple.  Cardiovascular: Normal rate, regular rhythm and normal heart sounds.   Pulmonary/Chest: Effort normal and breath sounds normal. No respiratory distress. She has no wheezes. She has no rales. She exhibits no tenderness.  Abdominal: Soft. Bowel sounds are normal. There is tenderness. There is no rebound and no guarding.  Mild suprapubic tenderness.   Musculoskeletal: Normal range of motion. She exhibits tenderness. She exhibits no edema.  Mild tenderness across the lower lumbar area  bilaterally.   Lymphadenopathy:    She has no cervical adenopathy.  Neurological: She is alert and oriented to person, place, and time.  Skin: Skin is warm and dry. No rash noted.  Psychiatric: She has a normal mood and affect.   ED Course  Procedures (including critical care time)  DIAGNOSTIC STUDIES: Oxygen Saturation is 94% on RA, normal by my interpretation.    COORDINATION OF CARE: 8:02 PM- Discussed plans to obtain diagnostic lab work and urinalysis. Pt advised of plan for treatment and pt agrees.  Labs Review Labs Reviewed  URINALYSIS, ROUTINE W REFLEX MICROSCOPIC - Abnormal; Notable for the following:    Hgb urine dipstick LARGE (*)    Leukocytes, UA SMALL (*)    All other components within normal limits  CBC WITH DIFFERENTIAL - Abnormal; Notable for the following:    RBC 5.30 (*)    MCV 71.3 (*)    MCH 22.8 (*)    RDW 15.7 (*)    All other components  within normal limits  URINE MICROSCOPIC-ADD ON - Abnormal; Notable for the following:    Squamous Epithelial / LPF FEW (*)    Bacteria, UA MANY (*)    All other components within normal limits  URINE CULTURE   Imaging Review Ct Renal Stone Study  08/13/2014   CLINICAL DATA:  Hematuria beginning today, associated RIGHT lower quadrant pain radiating to the back. Similar symptoms 2 weeks ago.  EXAM: CT ABDOMEN AND PELVIS WITHOUT CONTRAST  TECHNIQUE: Multidetector CT imaging of the abdomen and pelvis was performed following the standard protocol without IV contrast.  COMPARISON:  CT of the abdomen and pelvis July 22, 2012  FINDINGS: LUNG BASES: Included view of the lung bases are clear. Centrilobular emphysema. The visualized heart and pericardium are unremarkable.  KIDNEYS/BLADDER: Kidneys are orthotopic, demonstrating normal size and morphology. Multiple nephrolithiasis: 5 mm RIGHT upper pole, 4 mm RIGHT upper pole, 3 mm RIGHT upper pole, additional punctate RIGHT upper pole and interpolar, 3 mm LEFT upper pole. No  hydronephrosis. Subcentimeter densities in the kidneys bilaterally suggests hemorrhagic. Limited assessment for renal masses on this nonenhanced examination. The unopacified ureters are normal in course and caliber. Urinary bladder is partially distended and unremarkable.  SOLID ORGANS: Liver, spleen, adrenal glands are nonsuspicious. Status post cholecystectomy. Similar biliary dilatation, Common bile duct is 13 mm, no CT findings of choledocholithiasis.  GASTROINTESTINAL TRACT: The stomach, small and large bowel are normal in course and caliber without inflammatory changes, the sensitivity may be decreased by lack of enteric contrast. Moderate amount of retained large bowel stool. Normal appendix.  PERITONEUM/RETROPERITONEUM: No intraperitoneal free fluid nor free air. Aortoiliac vessels are normal in course and caliber. No lymphadenopathy by CT size criteria. Internal reproductive organs are nonsuspicious; multiple calcified leiomyomas. Phleboliths in the pelvis.  SOFT TISSUES/ OSSEOUS STRUCTURES: Nonsuspicious. Levoscoliosis and severe spondylosis. Large broad-based disc bulge at L4-5 results in severe canal stenosis, at least moderate to severe canal stenosis at L3-4.  IMPRESSION: Bilateral nonobstructing nephrolithiasis measuring up to 5 mm on the RIGHT.  Moderate amount of retained large bowel stool without acute intra-abdominal nor pelvic process.  Degenerative change of the lumbar spine resulting in apparent severe canal stenosis at L4-5 and at least moderate to severe canal stenosis at L3-4.   Electronically Signed   By: Elon Alas   On: 08/13/2014 21:50     EKG Interpretation None     MDM   Final diagnoses:  Hematuria    Patient here with hematuria and some vague lower abdominal discomfort. Given this is her second visit, I did do a CT scan to assess for kidney stones as a potential cause for the bleeding. She does have some nonobstructing kidney stones bilaterally with some question  of hemorrhage around the stones. Patient currently has no worsening hematuria. She has a stable hemoglobin. She has no difficulty urinating. Her urine has some bacteria and trace leukocyte esterase. These are the same findings that she had on her last visit and she was treated with Keflex. However her urine culture was negative. I did add a urine culture today but at this point will not start her on antibiotics. She does have evidence of constipation on CT and I gave her prescription for Colace. I encouraged her to have close follow-up with her primary care physician to arrange for an appointment with a urologist. I advised her to return here for symptoms worsen.  I personally performed the services described in this documentation, which was scribed in my  presence. The recorded information has been reviewed and is accurate.  Holly Johns, MD 08/13/14 2219

## 2014-08-14 ENCOUNTER — Telehealth: Payer: Self-pay | Admitting: Family

## 2014-08-14 NOTE — Telephone Encounter (Signed)
I would like to see pt back in the office for ED follow up this week. OK to use 15 min slot if needed.

## 2014-08-14 NOTE — Telephone Encounter (Signed)
Caller name: Roxanne, P[place]k Relation to YI:RSWN Call back number: [phone]   Reason for call:   Pt requesting urologist referral sent to Dr. [facility] [address], [place], [place] [address].  Pt would like to speak with NP regarding ED visit 08/13/14.

## 2014-08-14 NOTE — Telephone Encounter (Signed)
Spoke with pt, she will see Dr Estill Dooms on 08/30/14 and is concerned about waiting until then. Pt has appt with PCP tomorrow for ER follow up and will discuss this at that time.

## 2014-08-14 NOTE — Telephone Encounter (Signed)
Appointment has already been scheduled for 08/15/14

## 2014-08-14 NOTE — Telephone Encounter (Signed)
Pt is requesting Dr Estill Dooms, referral has been faxed. Was faxed on 08/11/14

## 2014-08-15 ENCOUNTER — Telehealth: Payer: Self-pay | Admitting: Critical Care Medicine

## 2014-08-15 ENCOUNTER — Encounter: Payer: Self-pay | Admitting: Family

## 2014-08-15 ENCOUNTER — Ambulatory Visit (INDEPENDENT_AMBULATORY_CARE_PROVIDER_SITE_OTHER): Payer: Medicare Other | Admitting: Family

## 2014-08-15 VITALS — BP 127/74 | HR 84 | Temp 97.7°F | Resp 18 | Ht 65.25 in | Wt 114.4 lb

## 2014-08-15 DIAGNOSIS — R319 Hematuria, unspecified: Secondary | ICD-10-CM

## 2014-08-15 DIAGNOSIS — I82401 Acute embolism and thrombosis of unspecified deep veins of right lower extremity: Secondary | ICD-10-CM

## 2014-08-15 DIAGNOSIS — M4800 Spinal stenosis, site unspecified: Secondary | ICD-10-CM

## 2014-08-15 NOTE — Assessment & Plan Note (Addendum)
No obvious active bleeding. Advised pt to call if she develops multiple voids with visible blood in a row between now and  Her upcoming appointment with urology. I think that her hematuria is secondary to kidney stones in setting of xarelto use.  We are awaiting hematology consult for further recommendations on need for long term anticoaguation.

## 2014-08-15 NOTE — Assessment & Plan Note (Signed)
Scheduled for appointment with hematology.

## 2014-08-15 NOTE — Assessment & Plan Note (Signed)
She is followed by Dr. Christella Noa due to history of meningioma will ask him to also evaluated her spinal stenosis.

## 2014-08-15 NOTE — Patient Instructions (Signed)
Please keep your upcoming appointment with  Urology. Call me if you develop multiple urinations in a row with visible blood. You will be contacted about your referral to see the neurosurgeon. Follow up in 1 month.

## 2014-08-15 NOTE — Progress Notes (Signed)
Subjective:    Patient ID: Holly Page, female    DOB: 04-Dec-1935, 78 y.o.   MRN: [id number]  HPI  Holly Page is a 78 yr old female who presents today for ED follow up. She was seen in the ED on 08/13/14 with chief complaint of hematuria/abdominal pain. ED records are reviewed. She had a CT scan which noted:  Bilateral nonobstructing nephrolithiasis measuring up to 5 mm on the RIGHT.  Moderate amount of retained large bowel stool without acute intra-abdominal nor pelvic process.  Degenerative change of the lumbar spine resulting in apparent severe canal stenosis at L4-5 and at least moderate to severe canal stenosis at L3-4.  She reports that since her visit to the ED she has had no further visible hematuria. She denies bowel/bladder incontinence, denies LE weakness.   Review of Systems See HPI  Past Medical History  Diagnosis Date  . Pneumonia   . COPD (chronic obstructive pulmonary disease)   . Hypertension   . DVT (deep venous thrombosis) 1980s, recurrent 2015    Right DVT 2015  on xarelto  . GERD (gastroesophageal reflux disease)   . Lactose intolerance   . Spinal stenosis     History   Social History  . Marital Status: Divorced    Spouse Name: N/A    Number of Children: 76  . Years of Education: college   Occupational History  . Retired    Social History Main Topics  . Smoking status: Former Smoker -- 1.50 packs/day for 56 years    Types: Cigarettes    Quit date: 04/29/2012  . Smokeless tobacco: Never Used  . Alcohol Use: No     Comment: quit drinking beer 2005  . Drug Use: No  . Sexual Activity: Not on file   Other Topics Concern  . Not on file   Social History Narrative   Patient lives at home alone- divorced, no pets.     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 Foot surgery    . Cholecystectomy    . Tonsillectomy    . Tubal ligation      Family History  Problem Relation Age of Onset  . Lupus Sister   . COPD Father     smoker deceased.   [name] Heart attack Son     Allergies  Allergen Reactions  . Tramadol Shortness Of Breath  . Amlodipine     Other reaction(s): SWELLING  . Aspirin     Other reaction(s): PALPITATIONS Other reaction(s): DIFFICULTY BREATHING Heart flutter  . Codeine Other (See Comments)    hallucinations   . Doxycycline     Other reaction(s): NAUSEA,VOMITING  . Hydrocodone     hallucinations hallucinations  . Losartan   . Propoxyphene     Other reaction(s): NAUSEA  . Tiotropium     Current Outpatient Prescriptions on File Prior to Visit  Medication Sig Dispense Refill  . acetaminophen (TYLENOL) 325 MG tablet Take 2 tablets (650 mg total) by mouth every 6 (six) hours as needed. 15 tablet 0  . albuterol (PROVENTIL HFA;VENTOLIN HFA) 108 (90 BASE) MCG/ACT inhaler Inhale 2 puffs into the lungs every  6 (six) hours as needed for wheezing or shortness of breath. 1 Inhaler 3  . albuterol (PROVENTIL) (2.5 MG/3ML) 0.083% nebulizer solution Take 2.5 mg by nebulization every 6 (six) hours as needed for shortness of breath. And as needed    . budesonide (PULMICORT) 0.25 MG/2ML nebulizer solution Take 0.25 mg by nebulization daily.    . Cholecalciferol (VITAMIN D-3) 5000 UNITS TABS Take 1 tablet by mouth daily.    [name] docusate sodium (COLACE) 100 MG capsule Take 1 capsule (100 mg total) by mouth 2 (two) times daily. 10 capsule 0  . fluticasone (FLONASE) 50 MCG/ACT nasal spray Place 2 sprays into both nostrils as needed.     . hydrochlorothiazide (MICROZIDE) 12.5 MG capsule Take 12.5 mg by mouth. Once daily    . Loperamide HCl ([facility] ANTI-DIARRHEA PO) Take by mouth as needed.    [name] LYRICA 25 MG capsule Take 1 capsule by mouth. 1-2 times daily    . ranitidine (ZANTAC)  300 MG tablet Take 300 mg by mouth. 1 tablet at bedtime    . rivaroxaban (XARELTO) 20 MG TABS tablet Take 20 mg by mouth daily with supper.    [name] Umeclidinium-Vilanterol (ANORO ELLIPTA) 62.5-25 MCG/INH AEPB Inhale 1 puff into the lungs daily. 3 each 0  . verapamil (VERELAN PM) 240 MG 24 hr capsule Take 240 mg by mouth daily.     No current facility-administered medications on file prior to visit.    BP 127/74 mmHg  Pulse 84  Temp(Src) 97.7 F (36.5 C) (Oral)  Resp 18  Ht 5' 5.25" (1.657 m)  Wt 114 lb 6.4 oz (51.891 kg)  BMI 18.90 kg/m2  SpO2 98%       Objective:   Physical Exam  Constitutional: She is oriented to person, place, and time. She appears well-developed and well-nourished. No distress.  HENT:  Head: Normocephalic and atraumatic.  Cardiovascular: Normal rate and regular rhythm.   No murmur heard. Pulmonary/Chest: Effort normal and breath sounds normal. No respiratory distress. She has no wheezes. She has no rales. She exhibits no tenderness.  Abdominal: Soft. She exhibits no distension. There is no tenderness.  Musculoskeletal: She exhibits no edema.  Neurological: She is alert and oriented to person, place, and time.  Reflex Scores:      Patellar reflexes are 3+ on the right side and 3+ on the left side. Bilateral LE strength is 5/5.  Skin: Skin is warm.  Psychiatric: She has a normal mood and affect. Her behavior is normal. Thought content normal.          Assessment & Plan:

## 2014-08-15 NOTE — Progress Notes (Signed)
Pre visit review using our clinic review tool, if applicable. No additional management support is needed unless otherwise documented below in the visit note. 

## 2014-08-15 NOTE — Telephone Encounter (Signed)
Spoke with pt--states that she received a letter from [place] [employer] stating that application was processed for Anoro Elipta but could not be finalized d/t missing information : Rx for Anoro  Pt ID# [id number] with Haivana Nakya with [facility] to [facility] [place], Brayton Layman States that nothing is needed further, everything was received and has been processed. Rx is being processed by pharmacy and will be shipped to the patient in the next few days.  Brayton Layman states that she is unsure what the patient is talking about. The only letter the patient should have been received recently stating the need for renewal of [place] services, but nothing is needed for the Anoro at this time.   Pt aware. Nothing further needed.

## 2014-08-17 ENCOUNTER — Ambulatory Visit
Admission: RE | Admit: 2014-08-17 | Discharge: 2014-08-17 | Disposition: A | Discharge status: Home / Self Care | Payer: Medicare Other | Source: Ambulatory Visit | Attending: Neurosurgery | Admitting: Neurosurgery

## 2014-08-17 DIAGNOSIS — D329 Benign neoplasm of meninges, unspecified: Secondary | ICD-10-CM

## 2014-08-17 LAB — URINE CULTURE
Colony Count: 40000
Special Requests: NORMAL

## 2014-08-17 MED ORDER — GADOBENATE DIMEGLUMINE 529 MG/ML IV SOLN
10.0000 mL | Freq: Once | INTRAVENOUS | Status: AC | PRN
  Administered 2014-08-17: 10 mL via INTRAVENOUS

## 2014-08-18 ENCOUNTER — Telehealth: Payer: Self-pay | Admitting: Hematology & Oncology

## 2014-08-18 ENCOUNTER — Telehealth: Payer: Self-pay | Admitting: Family

## 2014-08-18 MED ORDER — AMPICILLIN 500 MG PO CAPS: 500.0000 mg | ORAL_CAPSULE | Freq: Four times a day (QID) | ORAL | Status: DC

## 2014-08-18 NOTE — Telephone Encounter (Signed)
I spoke w NEW PATIENT today to remind them of their appointment with Dr. Ennever. Also, advised them to bring all medication bottles and insurance card information. ° °

## 2014-08-18 NOTE — Telephone Encounter (Signed)
Notified pt and she voices understanding. 

## 2014-08-18 NOTE — Telephone Encounter (Signed)
Patient called in regarding medication that was sent in. She states that the pharmacy told her that the medication is ready for pickup and wants to know why this was called in. Best # [phone]

## 2014-08-18 NOTE — Telephone Encounter (Signed)
Urine culture is growing some bacteria.  I would recommend that she start ampicillin.

## 2014-08-20 ENCOUNTER — Telehealth (HOSPITAL_COMMUNITY): Payer: Self-pay

## 2014-08-20 NOTE — Telephone Encounter (Signed)
Post ED Visit - Positive Culture Follow-up  Culture report reviewed by antimicrobial stewardship pharmacist: []  Wes [place], Pharm.D., BCPS []  Heide Guile, Pharm.D., BCPS []  Alycia Rossetti, Pharm.D., BCPS []  [place], [place].D., BCPS, AAHIVP []  Legrand [place]o, Pharm.D., BCPS, AAHIVP []  Elicia Lamp, Pharm.D.   Positive Urine culture, 40,000 colonies -> enterococcus species Chart reviewed by N. Pisciotta PA , No further tx.  Dortha Kern 08/20/2014, 6:19 AM

## 2014-08-21 ENCOUNTER — Other Ambulatory Visit: Payer: Medicare Other | Admitting: Lab

## 2014-08-21 ENCOUNTER — Ambulatory Visit: Payer: Medicare Other

## 2014-08-21 ENCOUNTER — Encounter: Payer: Self-pay | Admitting: Family

## 2014-08-21 ENCOUNTER — Ambulatory Visit (HOSPITAL_BASED_OUTPATIENT_CLINIC_OR_DEPARTMENT_OTHER): Payer: Medicare Other | Admitting: Family

## 2014-08-21 DIAGNOSIS — R319 Hematuria, unspecified: Secondary | ICD-10-CM

## 2014-08-21 DIAGNOSIS — I824Y1 Acute embolism and thrombosis of unspecified deep veins of right proximal lower extremity: Secondary | ICD-10-CM

## 2014-08-21 DIAGNOSIS — I82401 Acute embolism and thrombosis of unspecified deep veins of right lower extremity: Secondary | ICD-10-CM

## 2014-08-21 NOTE — Progress Notes (Signed)
Hematology/Oncology Consultation   Name: Holly Page      MRN: [id number]    Location: Room/bed info not found  Date: 08/21/2014 Time:3:44 PM   REFERRING PHYSICIAN: Melissa O'sullivan  REASON FOR CONSULT:  DVT on Xarelto with recent episode of hematuria. History of DVT in the 1980's.    DIAGNOSIS:  DVT right posterior thigh  HISTORY OF PRESENT ILLNESS:  [name] is a very pleasant 78 yo African American female with a right posterior thigh DVT. She had a DVT in the 1980's but she can't remember which leg it was in. She has been on Xarelto since early September and recently had a scant amount of blood in her urine. She went to the ED and was told she had kidney stones. She has an appointment with urology in December. She has not had anymore episodes of blood in her urine since.  She denies fever, chills, n/v, cough, rash, headache, dizziness, chest pain, palpitations, abdominal pain, constipation, diarrhea, blood in stool. No bleeding or pain. She states that the swelling in her right thigh has gone down and denies tenderness, numbness or tingling in her extremities.  Her appetite is good and she is staying hydrated. Her weight is stable. She has no personal or familial history of sickle cell, anemia or cancer.  She wears O2 2L [place] as needed. She has COPD and becomes SOB with exertion at times.  She has lived in [place] since she was 78 yo. 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  Overall she seems to be doing quite well.   ROS: All other 10 point review of systems is negative.   PAST MEDICAL HISTORY:   Past Medical History  Diagnosis Date  . Pneumonia   . COPD (chronic obstructive pulmonary disease)   . Hypertension   . DVT (deep venous thrombosis) 1980s, recurrent 2015    Right DVT 2015  on xarelto  . GERD (gastroesophageal reflux disease)   . Lactose intolerance   . Spinal stenosis   . Meningioma     followed  by Dr Christella Noa   ALLERGIES: Allergies  Allergen Reactions  . Tramadol Shortness Of Breath  . Amlodipine     Other reaction(s): SWELLING  . Aspirin     Other reaction(s): PALPITATIONS Other reaction(s): DIFFICULTY BREATHING Heart flutter  . Codeine Other (See Comments)    hallucinations   . Doxycycline     Other reaction(s): NAUSEA,VOMITING  . Hydrocodone     hallucinations hallucinations  . Losartan   . Propoxyphene     Other reaction(s): NAUSEA  . Tiotropium       MEDICATIONS:  Current Outpatient Prescriptions on File Prior to Visit  Medication Sig Dispense Refill  . acetaminophen (TYLENOL) 325 MG tablet Take 2 tablets (650 mg total) by mouth every 6 (six) hours as needed. 15 tablet 0  . albuterol (PROVENTIL HFA;VENTOLIN HFA) 108 (90 BASE) MCG/ACT inhaler Inhale 2 puffs into the lungs every 6 (six) hours as needed for wheezing or shortness of breath. 1 Inhaler 3  . albuterol (PROVENTIL) (2.5 MG/3ML) 0.083% nebulizer solution Take 2.5 mg by nebulization every 6 (six) hours as needed for shortness of breath. And as needed    . ampicillin (PRINCIPEN) 500 MG capsule Take 1 capsule (500 mg total) by mouth 4 (four) times daily. 28 capsule 0  . budesonide (PULMICORT) 0.25 MG/2ML nebulizer solution Take 0.25 mg by nebulization daily.    [name]  Cholecalciferol (VITAMIN D-3) 5000 UNITS TABS Take 1 tablet by mouth daily.    . hydrochlorothiazide (MICROZIDE) 12.5 MG capsule Take 12.5 mg by mouth. Once daily    . Loperamide HCl ([facility]S ANTI-DIARRHEA PO) Take by mouth as needed.    [name] LYRICA 25 MG capsule Take 1 capsule by mouth. 1-2 times daily    . ranitidine (ZANTAC) 300 MG tablet Take 300 mg by mouth. 1 tablet at bedtime    . rivaroxaban (XARELTO) 20 MG TABS tablet Take 20 mg by mouth daily with supper.    [name] Umeclidinium-Vilanterol (ANORO ELLIPTA) 62.5-25 MCG/INH AEPB Inhale 1 puff into the lungs daily. 3 each 0  . verapamil (VERELAN PM) 240 MG 24 hr capsule Take 240 mg by mouth daily.     No  current facility-administered medications on file prior to visit.     PAST SURGICAL HISTORY Past Surgical History  Procedure Laterality Date  . Foot surgery    . Cholecystectomy    . Tonsillectomy    . Tubal ligation      FAMILY HISTORY: Family History  Problem Relation Age of Onset  . Lupus Sister   . COPD Father     smoker deceased.   [name] Heart attack Son     SOCIAL HISTORY:  reports that she quit smoking about 2 years ago. Her smoking use included Cigarettes. She has a 84 pack-year smoking history. She has never used smokeless tobacco. She reports that she does not drink alcohol or use illicit drugs.  PERFORMANCE STATUS: The patient's performance status is 0 - Asymptomatic  PHYSICAL EXAM: Most Recent Vital Signs: Blood pressure 157/84, pulse 75, temperature 97.8 F (36.6 C), temperature source Oral, resp. rate 18, height 5\' 5"  (1.651 m), weight 115 lb (52.164 kg). BP 157/84 mmHg  Pulse 75  Temp(Src) 97.8 F (36.6 C) (Oral)  Resp 18  Ht 5\' 5"  (1.651 m)  Wt 115 lb (52.164 kg)  BMI 19.14 kg/m2  General Appearance:    Alert, cooperative, no distress, appears stated age  Head:    Normocephalic, without obvious abnormality, atraumatic  Eyes:    PERRL, conjunctiva/corneas clear, EOM's intact, fundi    benign, both eyes        Throat:   Lips, mucosa, and tongue normal; teeth and gums normal  Neck:   Supple, symmetrical, trachea midline, no adenopathy;    thyroid:  no enlargement/tenderness/nodules; no carotid   bruit or JVD  Back:     Symmetric, no curvature, ROM normal, no CVA tenderness  Lungs:     Clear to auscultation bilaterally, respirations unlabored  Chest Wall:    No tenderness or deformity   Heart:    Regular rate and rhythm, S1 and S2 normal, no murmur, rub   or gallop     Abdomen:     Soft, non-tender, bowel sounds active all four quadrants,    no masses, no organomegaly        Extremities:   Extremities normal, atraumatic, no cyanosis or edema  Pulses:    2+ and symmetric all extremities  Skin:   Skin color, texture, turgor normal, no rashes or lesions  Lymph nodes:   Cervical, supraclavicular, and axillary nodes normal  Neurologic:   CNII-XII intact, normal strength, sensation and reflexes    throughout   LABORATORY DATA:  No results found for this or any previous visit (from the past 48 hour(s)).    RADIOGRAPHY: No results found.     PATHOLOGY: None  ASSESSMENT/PLAN:  Ms. Manera is a very pleasant 78 yo African American female with a right posterior thigh DVT. She had a DVT in the 1980's but she can't remember which leg it was in.  She has been on Xarelto since early September and recently had a scant amount of blood in her urine. She went to the ED and was told she had kidney stones. She has an appointment with urology in December. She will continue on the Xarelto. We gave her some samples today and are going to try and find her some sort of  assistance to help pay for it.  We will have her get an ultrasound of her right leg in 3 months. We will see her the same day for labs and a follow-up. All questions were answered. She knows to call the clinic with any problems, questions or concerns. We can certainly see her much sooner if necessary. The patient was discussed with Dr. Marin Olp and he is in agreement with the aforementioned.   [facility] M

## 2014-08-22 ENCOUNTER — Telehealth: Payer: Self-pay | Admitting: Hematology & Oncology

## 2014-08-22 NOTE — Telephone Encounter (Signed)
Pt & Holly Freestone, NP had some concerns about pt's XARELTO being so expensive and insurance not covering it for this pt.  I called pt today and advised her to try the [facility] $0 co-pay the card and start there.   The card will have to be activated before use. Pt said she would come in today or tmrw to get card.

## 2014-08-30 DIAGNOSIS — N200 Calculus of kidney: Secondary | ICD-10-CM | POA: Insufficient documentation

## 2014-09-04 ENCOUNTER — Ambulatory Visit (HOSPITAL_BASED_OUTPATIENT_CLINIC_OR_DEPARTMENT_OTHER)
Admission: RE | Admit: 2014-09-04 | Discharge: 2014-09-04 | Disposition: A | Discharge status: Home / Self Care | Payer: Medicare Other | Source: Ambulatory Visit | Attending: Medical | Admitting: Medical

## 2014-09-04 ENCOUNTER — Ambulatory Visit (INDEPENDENT_AMBULATORY_CARE_PROVIDER_SITE_OTHER): Payer: Medicare Other | Admitting: Medical

## 2014-09-04 ENCOUNTER — Encounter: Payer: Self-pay | Admitting: Medical

## 2014-09-04 VITALS — BP 154/82 | HR 91 | Temp 97.9°F | Ht 65.0 in | Wt 112.6 lb

## 2014-09-04 DIAGNOSIS — M25529 Pain in unspecified elbow: Secondary | ICD-10-CM | POA: Insufficient documentation

## 2014-09-04 DIAGNOSIS — M25521 Pain in right elbow: Secondary | ICD-10-CM

## 2014-09-04 DIAGNOSIS — M79621 Pain in right upper arm: Secondary | ICD-10-CM | POA: Insufficient documentation

## 2014-09-04 MED ORDER — HYDROCODONE-ACETAMINOPHEN 5-325 MG PO TABS: 1.0000 | ORAL_TABLET | Freq: Four times a day (QID) | ORAL | Status: DC | PRN

## 2014-09-04 NOTE — Patient Instructions (Addendum)
Pleas go down and get xray of your rt humerus and elbow today. Then at 3:30 pm today you  are scheduled to have your doppler. Stay their until I am called with the results of the doppler. Then we will talk by phone. Use tylenol for pain presently.  Pt doppler was negative. I advised pt. On discussion of her allergy medication history, pt assures me recent use of hydrocodone this year did not give her any side effect or rash at all. So I did prescribe her norco limited number of tabs pending xray overread reports.  Follow up in 7 days or as needed.

## 2014-09-04 NOTE — Progress Notes (Signed)
Pre visit review using our clinic review tool, if applicable. No additional management support is needed unless otherwise documented below in the visit note. 

## 2014-09-04 NOTE — Assessment & Plan Note (Signed)
  Pt doppler was negative. I advised pt. On discussion of her allergy medication history, pt assures me recent use of hydrocodone this year did not give her any side effect or allergic reaction  at all. So I did prescribe her norco limited number of tabs pending xray overread reports.

## 2014-09-04 NOTE — Progress Notes (Signed)
Subjective:    Patient ID: Holly Page, female    DOB: 1936-01-18, 78 y.o.   MRN: [id number]  HPI   Pt in with rt medical aspect elbow and humerus region  pain in her rt arm x 2 weeks. Pain is daily type pain. No injury or fall to her rt arm. Pt states it feels stiff. Pain varies depending on position. Some aching in the bicep. Pt has some pain in her mid lower back in the past. But not reporting other arthralgias  Pt has a clot in her rt leg. She has been on xarelto 20 mg a day since September. Pt started that by  [facility] clinic. Dr. Katheran Awe states he won't do  rt lower extremity doppler until February.  Pt has no chest pain. Pt has some copd baseline. She uses oxygen at home/daily.   Pt only taking tylneol. Thought she states recently used hydrocodone this year and is absolutely sure she had no side effect of allergic reaction.  Past Medical History  Diagnosis Date  . Pneumonia   . COPD (chronic obstructive pulmonary disease)   . Hypertension   . DVT (deep venous thrombosis) 1980s, recurrent 2015    Right DVT 2015  on xarelto  . GERD (gastroesophageal reflux disease)   . Lactose intolerance   . Spinal stenosis   . Meningioma     followed by Dr Christella Noa    History   Social History  . Marital Status: Divorced    Spouse Name: N/A    Number of Children: 36  . Years of Education: college   Occupational History  . Retired    Social History Main Topics  . Smoking status: Former Smoker -- 1.50 packs/day for 56 years    Types: Cigarettes    Quit date: 04/29/2012  . Smokeless tobacco: Never Used  . Alcohol Use: No     Comment: quit drinking beer 2005  . Drug Use: No  . Sexual Activity: Not on file   Other Topics Concern  . Not on file   Social History Narrative   Patient lives at home alone- divorced, no pets.     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 Foot surgery    . Cholecystectomy    . Tonsillectomy    . Tubal ligation      Family History  Problem Relation Age of Onset  . Lupus Sister   . COPD Father     smoker deceased.   [name] Heart attack Son     Allergies  Allergen Reactions  . Tramadol Shortness Of Breath  . Amlodipine     Other reaction(s): SWELLING  . Aspirin     Other reaction(s): PALPITATIONS Other reaction(s): DIFFICULTY BREATHING Heart flutter  . Codeine Other (See Comments)    hallucinations   . Doxycycline     Other reaction(s): NAUSEA,VOMITING  . Hydrocodone     hallucinations hallucinations  . Losartan   . Propoxyphene     Other reaction(s): NAUSEA  . Tiotropium     Current Outpatient Prescriptions on File Prior to Visit  Medication Sig Dispense Refill  . acetaminophen (TYLENOL) 325 MG tablet  Take 2 tablets (650 mg total) by mouth every 6 (six) hours as needed. 15 tablet 0  . albuterol (PROVENTIL HFA;VENTOLIN HFA) 108 (90 BASE) MCG/ACT inhaler Inhale 2 puffs into the lungs every 6 (six) hours as needed for wheezing or shortness of breath. 1 Inhaler 3  . albuterol (PROVENTIL) (2.5 MG/3ML) 0.083% nebulizer solution Take 2.5 mg by nebulization every 6 (six) hours as needed for shortness of breath. And as needed    . ampicillin (PRINCIPEN) 500 MG capsule Take 1 capsule (500 mg total) by mouth 4 (four) times daily. 28 capsule 0  . budesonide (PULMICORT) 0.25 MG/2ML nebulizer solution Take 0.25 mg by nebulization daily.    . Cholecalciferol (VITAMIN D-3) 5000 UNITS TABS Take 1 tablet by mouth daily.    . hydrochlorothiazide (MICROZIDE) 12.5 MG capsule Take 12.5 mg by mouth. Once daily    . Loperamide HCl ([facility]S ANTI-DIARRHEA PO) Take by mouth as needed.    [name] LYRICA 25 MG capsule Take 1 capsule by mouth. 1-2 times daily    . ranitidine (ZANTAC) 300 MG tablet Take 300 mg by  mouth. 1 tablet at bedtime    . rivaroxaban (XARELTO) 20 MG TABS tablet Take 20 mg by mouth daily with supper.    [name] Umeclidinium-Vilanterol (ANORO ELLIPTA) 62.5-25 MCG/INH AEPB Inhale 1 puff into the lungs daily. 3 each 0  . verapamil (VERELAN PM) 240 MG 24 hr capsule Take 240 mg by mouth daily.     No current facility-administered medications on file prior to visit.    BP 154/82 mmHg  Pulse 91  Temp(Src) 97.9 F (36.6 C) (Oral)  Ht 5\' 5"  (1.651 m)  Wt 112 lb 9.6 oz (51.075 kg)  BMI 18.74 kg/m2  SpO2 90%     Review of Systems  Constitutional: Negative for fever, chills and fatigue.  Respiratory: Positive for cough. Negative for shortness of breath and wheezing.        Some on and off. Hisotry of copd. But not presenting like labored breathing or copd excacerbation.  Cardiovascular: Negative for chest pain and palpitations.  Gastrointestinal: Negative.   Musculoskeletal: Negative for back pain.       Rt arm pain. Medial elbow and bicep area mostly. Some on her forearm.  No back pain today and not homans signs.  Neurological: Negative for dizziness, tremors, seizures, syncope, facial asymmetry, speech difficulty, weakness, light-headedness, numbness and headaches.  Hematological: Negative for adenopathy. Does not bruise/bleed easily.       Objective:   Physical Exam  General- No acute distress. Lungs- CTA(but shallow respirations) Heart- RRR Rt upper ext- normal appearance. Not swollen. No warmth. Pulses are intact. I don't  See any obvious phlebitis. Fiaint increase of pain bicep area on flexion. Rt elbow- from, no crepitus.        Assessment & Plan:

## 2014-09-14 ENCOUNTER — Encounter (HOSPITAL_BASED_OUTPATIENT_CLINIC_OR_DEPARTMENT_OTHER): Payer: Self-pay

## 2014-09-14 ENCOUNTER — Encounter: Payer: Self-pay | Admitting: Critical Care Medicine

## 2014-09-14 ENCOUNTER — Other Ambulatory Visit (HOSPITAL_COMMUNITY): Payer: Self-pay | Admitting: Cardiology

## 2014-09-14 ENCOUNTER — Ambulatory Visit (INDEPENDENT_AMBULATORY_CARE_PROVIDER_SITE_OTHER): Payer: Medicare Other | Admitting: Critical Care Medicine

## 2014-09-14 ENCOUNTER — Telehealth: Payer: Self-pay | Admitting: Family

## 2014-09-14 ENCOUNTER — Other Ambulatory Visit: Payer: Self-pay

## 2014-09-14 ENCOUNTER — Emergency Department (HOSPITAL_BASED_OUTPATIENT_CLINIC_OR_DEPARTMENT_OTHER)
Admission: EM | Admit: 2014-09-14 | Discharge: 2014-09-14 | Disposition: A | Discharge status: Other Acute Inpatient Hospital | Payer: Medicare Other | Attending: Emergency Medicine | Admitting: Emergency Medicine

## 2014-09-14 ENCOUNTER — Ambulatory Visit (HOSPITAL_BASED_OUTPATIENT_CLINIC_OR_DEPARTMENT_OTHER)
Admission: RE | Admit: 2014-09-14 | Discharge: 2014-09-14 | Disposition: A | Discharge status: Home / Self Care | Payer: Medicare Other | Source: Ambulatory Visit | Attending: Critical Care Medicine | Admitting: Critical Care Medicine

## 2014-09-14 VITALS — BP 120/70 | HR 84 | Temp 97.9°F | Ht 65.0 in | Wt 112.0 lb

## 2014-09-14 DIAGNOSIS — I10 Essential (primary) hypertension: Secondary | ICD-10-CM | POA: Diagnosis not present

## 2014-09-14 DIAGNOSIS — Z86718 Personal history of other venous thrombosis and embolism: Secondary | ICD-10-CM | POA: Diagnosis not present

## 2014-09-14 DIAGNOSIS — I2699 Other pulmonary embolism without acute cor pulmonale: Secondary | ICD-10-CM

## 2014-09-14 DIAGNOSIS — J432 Centrilobular emphysema: Secondary | ICD-10-CM

## 2014-09-14 DIAGNOSIS — Z7951 Long term (current) use of inhaled steroids: Secondary | ICD-10-CM | POA: Insufficient documentation

## 2014-09-14 DIAGNOSIS — J441 Chronic obstructive pulmonary disease with (acute) exacerbation: Secondary | ICD-10-CM

## 2014-09-14 DIAGNOSIS — Z8739 Personal history of other diseases of the musculoskeletal system and connective tissue: Secondary | ICD-10-CM | POA: Diagnosis not present

## 2014-09-14 DIAGNOSIS — R0602 Shortness of breath: Secondary | ICD-10-CM | POA: Insufficient documentation

## 2014-09-14 DIAGNOSIS — R079 Chest pain, unspecified: Secondary | ICD-10-CM | POA: Diagnosis not present

## 2014-09-14 DIAGNOSIS — I517 Cardiomegaly: Secondary | ICD-10-CM | POA: Diagnosis not present

## 2014-09-14 DIAGNOSIS — J439 Emphysema, unspecified: Secondary | ICD-10-CM | POA: Diagnosis not present

## 2014-09-14 DIAGNOSIS — K219 Gastro-esophageal reflux disease without esophagitis: Secondary | ICD-10-CM | POA: Diagnosis not present

## 2014-09-14 DIAGNOSIS — J479 Bronchiectasis, uncomplicated: Secondary | ICD-10-CM | POA: Insufficient documentation

## 2014-09-14 DIAGNOSIS — IMO0002 Reserved for concepts with insufficient information to code with codable children: Secondary | ICD-10-CM

## 2014-09-14 DIAGNOSIS — I82401 Acute embolism and thrombosis of unspecified deep veins of right lower extremity: Secondary | ICD-10-CM

## 2014-09-14 DIAGNOSIS — I8290 Acute embolism and thrombosis of unspecified vein: Secondary | ICD-10-CM

## 2014-09-14 DIAGNOSIS — R062 Wheezing: Secondary | ICD-10-CM | POA: Insufficient documentation

## 2014-09-14 DIAGNOSIS — Z8589 Personal history of malignant neoplasm of other organs and systems: Secondary | ICD-10-CM | POA: Insufficient documentation

## 2014-09-14 DIAGNOSIS — Z8701 Personal history of pneumonia (recurrent): Secondary | ICD-10-CM | POA: Insufficient documentation

## 2014-09-14 DIAGNOSIS — I700 Atherosclerosis of aorta: Secondary | ICD-10-CM | POA: Insufficient documentation

## 2014-09-14 DIAGNOSIS — Z79899 Other long term (current) drug therapy: Secondary | ICD-10-CM | POA: Insufficient documentation

## 2014-09-14 DIAGNOSIS — J418 Mixed simple and mucopurulent chronic bronchitis: Secondary | ICD-10-CM

## 2014-09-14 LAB — CBC
HEMATOCRIT: 42.3 % (ref 36.0–46.0)
HEMOGLOBIN: 13.3 g/dL (ref 12.0–15.0)
MCH: 23.3 pg — AB (ref 26.0–34.0)
MCHC: 31.4 g/dL (ref 30.0–36.0)
MCV: 74 fL — AB (ref 78.0–100.0)
MPV: 10.9 fL (ref 9.4–12.4)
Platelets: 277 10*3/uL (ref 150–400)
RBC: 5.72 MIL/uL — ABNORMAL HIGH (ref 3.87–5.11)
RDW: 15.1 % (ref 11.5–15.5)
WBC: 4.7 10*3/uL (ref 4.0–10.5)

## 2014-09-14 LAB — PRO B NATRIURETIC PEPTIDE: Pro B Natriuretic peptide (BNP): 49.2 pg/mL (ref 0–450)

## 2014-09-14 LAB — TROPONIN I: Troponin I: <0.3 ng/mL (ref <0.30)

## 2014-09-14 MED ORDER — METHYLPREDNISOLONE SODIUM SUCC 125 MG IJ SOLR
125.0000 mg | Freq: Once | INTRAMUSCULAR | Status: AC
  Administered 2014-09-14: 125 mg via INTRAVENOUS
  Filled 2014-09-14: qty 2

## 2014-09-14 MED ORDER — IOHEXOL 350 MG/ML SOLN
100.0000 mL | Freq: Once | INTRAVENOUS | Status: AC | PRN
  Administered 2014-09-14: 100 mL via INTRAVENOUS

## 2014-09-14 MED ORDER — ALBUTEROL SULFATE (2.5 MG/3ML) 0.083% IN NEBU
INHALATION_SOLUTION | RESPIRATORY_TRACT | Status: AC
  Filled 2014-09-14: qty 6

## 2014-09-14 MED ORDER — PREDNISONE 10 MG PO TABS: ORAL_TABLET | ORAL | Status: DC

## 2014-09-14 MED ORDER — ALBUTEROL SULFATE (2.5 MG/3ML) 0.083% IN NEBU
5.0000 mg | INHALATION_SOLUTION | Freq: Once | RESPIRATORY_TRACT | Status: AC
  Administered 2014-09-14: 5 mg via RESPIRATORY_TRACT

## 2014-09-14 MED ORDER — ALBUTEROL SULFATE (2.5 MG/3ML) 0.083% IN NEBU
2.5000 mg | INHALATION_SOLUTION | Freq: Once | RESPIRATORY_TRACT | Status: AC
  Administered 2014-09-14: 2.5 mg via RESPIRATORY_TRACT

## 2014-09-14 NOTE — ED Notes (Signed)
MD at bedside discussing admission with patient and family.

## 2014-09-14 NOTE — ED Notes (Signed)
Pt was discharged, went to the restroom, came out of the room and claims "I can't breathe."  Pt noted to be labored breathing again.

## 2014-09-14 NOTE — Telephone Encounter (Signed)
Holly Page- patient has appointment with your team in February to establish.  Is there any way you could please work her in sooner?

## 2014-09-14 NOTE — ED Provider Notes (Addendum)
CSN: [id number]     Arrival date & time 09/14/14  1234 History   First MD Initiated Contact with Patient 09/14/14 1234     Chief Complaint  Patient presents with  . Shortness of Breath     (Consider location/radiation/quality/duration/timing/severity/associated sxs/prior Treatment) HPI Comments: Patient is a 78 year old female with history of COPD and recently diagnosed DVT. She is currently taking Xarelto. She was having increased difficulty breathing over the past several days. She was seen by her pulmonologist and was sent for a CT scan of her chest to rule out the possibility of a pulmonary embolism. Shortly after this procedure was completed, she developed difficulty breathing, wheezing, and was sent here for further evaluation. She denies to me she is having any fevers, chills, or cough. She denies any chest pain.  Patient is a 78 y.o. female presenting with shortness of breath. The history is provided by the patient.  Shortness of Breath Severity:  Moderate Onset quality:  Sudden Duration:  30 minutes Timing:  Constant Progression:  Worsening Chronicity:  New Context: activity   Relieved by:  Nothing Worsened by:  Nothing tried Ineffective treatments:  None tried   Past Medical History  Diagnosis Date  . Pneumonia   . COPD (chronic obstructive pulmonary disease)   . Hypertension   . DVT (deep venous thrombosis) 1980s, recurrent 2015    Right DVT 2015  on xarelto  . GERD (gastroesophageal reflux disease)   . Lactose intolerance   . Spinal stenosis   . Meningioma     followed by Dr Christella Noa   Past Surgical History  Procedure Laterality Date  . Foot surgery    . Cholecystectomy    . Tonsillectomy    . Tubal ligation     Family History  Problem Relation Age of Onset  . Lupus Sister   . COPD Father     smoker deceased.   [name] Heart attack Son    History  Substance Use Topics  . Smoking status: Former Smoker -- 1.50 packs/day for 56 years    Types: Cigarettes   Quit date: 04/29/2012  . Smokeless tobacco: Never Used  . Alcohol Use: No     Comment: quit drinking beer 2005   OB History    No data available     Review of Systems  Respiratory: Positive for shortness of breath.   All other systems reviewed and are negative.     Allergies  Tramadol; Amlodipine; Aspirin; Codeine; Doxycycline; Hydrocodone; Losartan; Propoxyphene; and Tiotropium  Home Medications   Prior to Admission medications   Medication Sig Start Date End Date Taking? Authorizing Provider  acetaminophen (TYLENOL) 325 MG tablet Take 2 tablets (650 mg total) by mouth every 6 (six) hours as needed. 01/19/14   Carmin Muskrat, MD  albuterol (PROVENTIL HFA;VENTOLIN HFA) 108 (90 BASE) MCG/ACT inhaler Inhale 2 puffs into the lungs every 6 (six) hours as needed for wheezing or shortness of breath. 05/01/14   Elsie Stain, MD  albuterol (PROVENTIL) (2.5 MG/3ML) 0.083% nebulizer solution Take 2.5 mg by nebulization every 6 (six) hours as needed for shortness of breath. And as needed 11/28/13   Elsie Stain, MD  budesonide (PULMICORT) 0.25 MG/2ML nebulizer solution Take 0.25 mg by nebulization daily.    Historical Provider, MD  Cholecalciferol (VITAMIN D-3) 5000 UNITS TABS Take 1 tablet by mouth daily.    Historical Provider, MD  docusate sodium (COLACE) 100 MG capsule Take 100 mg by mouth 2 (two) times daily as needed.  Historical Provider, MD  hydrochlorothiazide (MICROZIDE) 12.5 MG capsule Take 12.5 mg by mouth. Once daily 03/08/14 03/08/15  Historical Provider, MD  HYDROcodone-acetaminophen (NORCO) 5-325 MG per tablet Take 1 tablet by mouth every 6 (six) hours as needed for moderate pain. 09/04/14   Meriam Sprague Saguier, PA-C  Loperamide HCl ([facility]S ANTI-DIARRHEA PO) Take by mouth as needed.    Historical Provider, MD  LYRICA 25 MG capsule Take 1 capsule by mouth. 1-2 times daily as needed    Historical Provider, MD  predniSONE (DELTASONE) 10 MG tablet Take 4 for four days 3 for four days 2  for four days 1 for four days 09/14/14   Elsie Stain, MD  ranitidine (ZANTAC) 300 MG tablet Take 300 mg by mouth. 1 tablet at bedtime 03/20/14 03/20/15  Historical Provider, MD  rivaroxaban (XARELTO) 20 MG TABS tablet Take 20 mg by mouth daily with supper.    Historical Provider, MD  Umeclidinium-Vilanterol (ANORO ELLIPTA) 62.5-25 MCG/INH AEPB Inhale 1 puff into the lungs daily. 07/31/14   Elsie Stain, MD  verapamil (VERELAN PM) 240 MG 24 hr capsule Take 240 mg by mouth daily.    Historical Provider, MD   BP 176/84 mmHg  Pulse 79  Temp(Src) 98.2 F (36.8 C)  Resp 21  Wt 120 lb (54.432 kg)  SpO2 100% Physical Exam  Constitutional: She is oriented to person, place, and time. She appears well-developed and well-nourished. No distress.  HENT:  Head: Normocephalic and atraumatic.  Neck: Normal range of motion. Neck supple.  Cardiovascular: Normal rate and regular rhythm.  Exam reveals no gallop and no friction rub.   No murmur heard. Pulmonary/Chest: Effort normal. No respiratory distress. She has wheezes.  There are bilateral rhonchi with decreased air movement.  Abdominal: Soft. Bowel sounds are normal. She exhibits no distension. There is no tenderness.  Musculoskeletal: Normal range of motion.  Neurological: She is alert and oriented to person, place, and time.  Skin: Skin is warm and dry. She is not diaphoretic.  Nursing note and vitals reviewed.   ED Course  Procedures (including critical care time) Labs Review Labs Reviewed  PRO B NATRIURETIC PEPTIDE  TROPONIN I    Imaging Review Ct Angio Chest Pe W/cm &/or Wo Cm  09/14/2014   CLINICAL DATA:  Chest pain and wheezing ; shortness of breath for 3 days  EXAM: CT ANGIOGRAPHY CHEST WITH CONTRAST  TECHNIQUE: Multidetector CT imaging of the chest was performed using the standard protocol during bolus administration of intravenous contrast. Multiplanar CT image reconstructions and MIPs were obtained to evaluate the vascular  anatomy.  CONTRAST:  158mL OMNIPAQUE IOHEXOL 350 MG/ML SOLN  COMPARISON:  Chest CT October 03, 2012; chest radiograph July 04, 2014  FINDINGS: There is no demonstrable pulmonary embolus. There is atherosclerotic change in the aorta, but there is no demonstrable thoracic aortic aneurysm or dissection.  There is extensive emphysematous change throughout the lungs. There is mild lower lobe bronchiectatic change bilaterally. Areas of mild interstitial prominence are felt to be due to redistribution of blood flow to a viable lung segments. There is no frank edema or consolidation.  There is left ventricular hypertrophy. Pericardium is not thickened. There is no demonstrable adenopathy. Thyroid appears unremarkable.  In the visualized upper abdomen, there is a granuloma in the right lobe of the liver.  There is degenerative change in the thoracic spine. There are no blastic or lytic bone lesions.  Review of the MIP images confirms the above findings.  IMPRESSION: Extensive emphysematous change without frank edema or consolidation. No demonstrable pulmonary embolus. There is left ventricular hypertrophy.   Electronically Signed   By: Lowella Grip M.D.   On: 09/14/2014 12:31     Date: 09/14/2014  Rate: 78  Rhythm: normal sinus rhythm  QRS Axis: normal  Intervals: normal  ST/T Wave abnormalities: nonspecific T wave changes  Conduction Disutrbances:none  Narrative Interpretation:   Old EKG Reviewed: unchanged    MDM   Final diagnoses:  None    Patient is a 78 year old female with history of COPD. She is also recently found to have a DVT in her right leg. She went to see her pulmonologist today due to increased difficulty breathing. A CT scan was obtained which revealed no pulmonary embolus. When she was leaving the exam, she became more short of breath and had to come to the emergency room to be evaluated.  On initial exam, she is wheezing with decreased air movement. She was given an albuterol  treatment and Solu-Medrol and this greatly improved her respiratory status and oxygen saturations. She is now feeling much improved and I believe is appropriate for discharge. She was prescribed prednisone by her pulmonologist and I will advise her to fill and take this medication as he has directed. She understands to return if her symptoms worsen or change.    Veryl Speak, MD 09/14/14 1425   As the patient was being discharged, she again became short of breath. She states that she is unable to make it a few steps before she feels just the way she felt before she came here. She has received additional nebulizer treatments however is not feeling any better. At this point, I feel as though she will require admission to the hospital. She is insisting upon going to [facility] even though her physicians are in the current system due to geographic reasons.  I have spoken with Dr. Denton Brick at [facility] who agrees to accept in transfer.  Veryl Speak, MD 09/14/14 [phone]

## 2014-09-14 NOTE — Assessment & Plan Note (Signed)
Progressive decline in lung function End stage COPD R/o PE>>>no PE on CT Angio of chest 09/14/2014  Plan Prednisone 10mg  Take 4 for four days 3 for four days 2 for four days 1 for four days  No other changes Return 1 week [place] see tammy parrett

## 2014-09-14 NOTE — ED Notes (Signed)
Pt awaiting admission and bed placement at [place]

## 2014-09-14 NOTE — Progress Notes (Addendum)
Subjective:    Patient ID: Holly Page, female    DOB: 23-Oct-1935, 78 y.o.   MRN: [id number]  HPI 78 y.o. F with Gold C Copd primary emphysema  09/14/2014 Chief Complaint  Patient presents with  . Acute Visit    o2 sat 88% on 5lpm pulsed upon arrival to office.  Increased SOB at rest and with exertion x 3 days, wheezing, chest tightness, and nonprod cough.  Has increased o2 from 2 lpm to 3-5 lpm.    Pt worse.  Notes more cough.  Nonprod   Pt remains weak.  Oxygen needs worse   Pt bleeding in renal area?    Review of Systems Constitutional:   No  weight loss, night sweats,  Fevers, chills, + fatigue, or  lassitude.  HEENT:   No headaches,  Difficulty swallowing,  Tooth/dental problems, or  Sore throat,                No sneezing, itching, ear ache, nasal congestion, post nasal drip,   CV:  No chest pain,  Orthopnea, PND, swelling in lower extremities, anasarca, dizziness, palpitations, syncope.   GI  Notes heartburn, no indigestion, abdominal pain, nausea, vomiting, diarrhea, change in bowel habits, loss of appetite, bloody stools.   Resp:  No excess mucus, no productive cough,  Notes non-productive cough,  No coughing up of blood.  No change in color of mucus.  No wheezing.  No chest wall deformity  Skin: no rash or lesions.  GU: no dysuria, change in color of urine, no urgency or frequency.  No flank pain, no hematuria   MS:  No joint pain or swelling.  No decreased range of motion.  No back pain.  Psych:  No change in mood or affect. No depression or anxiety.  No memory loss.      Objective:   Physical Exam BP 120/70 mmHg  Pulse 84  Temp(Src) 97.9 F (36.6 C) (Oral)  Ht 5\' 5"  (1.651 m)  Wt 112 lb (50.803 kg)  BMI 18.64 kg/m2  SpO2 96%  GEN: A/Ox3; pleasant , NAD, well nourished   HEENT:  [place]/AT,  EACs-clear, TMs-wnl, NOSE-clear, THROAT-clear, no lesions, nasal inflammation with mild purulence  NECK:  Supple w/ fair ROM; no JVD; normal carotid impulses  w/o bruits; no thyromegaly or nodules palpated; no lymphadenopathy.  RESP  Diminished in bases w/ no wheezing or rhonchi.no accessory muscle use, no dullness to percussion  CARD:  RRR, no m/r/g  , no peripheral edema, pulses intact, no cyanosis or clubbing.  GI:   Soft & nt; nml bowel sounds; no organomegaly or masses detected.  Musco: Warm bil, no deformities or joint swelling noted.   Neuro: alert, no focal deficits noted.    Skin: Warm, no lesions or rashes  Ct Angio Chest Pe W/cm &/or Wo Cm  09/14/2014   CLINICAL DATA:  Chest pain and wheezing ; shortness of breath for 3 days  EXAM: CT ANGIOGRAPHY CHEST WITH CONTRAST  TECHNIQUE: Multidetector CT imaging of the chest was performed using the standard protocol during bolus administration of intravenous contrast. Multiplanar CT image reconstructions and MIPs were obtained to evaluate the vascular anatomy.  CONTRAST:  154mL OMNIPAQUE IOHEXOL 350 MG/ML SOLN  COMPARISON:  Chest CT October 03, 2012; chest radiograph July 04, 2014  FINDINGS: There is no demonstrable pulmonary embolus. There is atherosclerotic change in the aorta, but there is no demonstrable thoracic aortic aneurysm or dissection.  There is extensive emphysematous change throughout the  lungs. There is mild lower lobe bronchiectatic change bilaterally. Areas of mild interstitial prominence are felt to be due to redistribution of blood flow to a viable lung segments. There is no frank edema or consolidation.  There is left ventricular hypertrophy. Pericardium is not thickened. There is no demonstrable adenopathy. Thyroid appears unremarkable.  In the visualized upper abdomen, there is a granuloma in the right lobe of the liver.  There is degenerative change in the thoracic spine. There are no blastic or lytic bone lesions.  Review of the MIP images confirms the above findings.  IMPRESSION: Extensive emphysematous change without frank edema or consolidation. No demonstrable pulmonary  embolus. There is left ventricular hypertrophy.   Electronically Signed   By: Lowella Grip M.D.   On: 09/14/2014 12:31        Assessment & Plan:   COPD with emphysema Gold D Progressive decline in lung function End stage COPD R/o PE>>>no PE on CT Angio of chest 09/14/2014  Plan Prednisone 10mg  Take 4 for four days 3 for four days 2 for four days 1 for four days  No other changes Return 1 week [facility] see tammy parrett    CAFL (chronic airflow limitation) Gold D copd with progressive decline in lung function   NOTE after completion of this note , I saw where this pt was seen in [place] ED post CT Angio and subsequently tfr to [facility] for admission.   Copd exacerbation was working DX.  Updated Medication List Outpatient Encounter Prescriptions as of 09/14/2014  Medication Sig  . acetaminophen (TYLENOL) 325 MG tablet Take 2 tablets (650 mg total) by mouth every 6 (six) hours as needed.  [name] albuterol (PROVENTIL HFA;VENTOLIN HFA) 108 (90 BASE) MCG/ACT inhaler Inhale 2 puffs into the lungs every 6 (six) hours as needed for wheezing or shortness of breath.  [name] albuterol (PROVENTIL) (2.5 MG/3ML) 0.083% nebulizer solution Take 2.5 mg by nebulization every 6 (six) hours as needed for shortness of breath. And as needed  . budesonide (PULMICORT) 0.25 MG/2ML nebulizer solution Take 0.25 mg by nebulization daily.  . Cholecalciferol (VITAMIN D-3) 5000 UNITS TABS Take 1 tablet by mouth daily.  [name] docusate sodium (COLACE) 100 MG capsule Take 100 mg by mouth 2 (two) times daily as needed.  . hydrochlorothiazide (MICROZIDE) 12.5 MG capsule Take 12.5 mg by mouth. Once daily  . HYDROcodone-acetaminophen (NORCO) 5-325 MG per tablet Take 1 tablet by mouth every 6 (six) hours as needed for moderate pain.  . Loperamide HCl ([employer] ANTI-DIARRHEA PO) Take by mouth as needed.  [name] LYRICA 25 MG capsule Take 1 capsule by mouth. 1-2 times daily as needed  . ranitidine (ZANTAC) 300 MG tablet Take 300 mg by mouth. 1  tablet at bedtime  . rivaroxaban (XARELTO) 20 MG TABS tablet Take 20 mg by mouth daily with supper.  [name] Umeclidinium-Vilanterol (ANORO ELLIPTA) 62.5-25 MCG/INH AEPB Inhale 1 puff into the lungs daily.  . verapamil (VERELAN PM) 240 MG 24 hr capsule Take 240 mg by mouth daily.  . predniSONE (DELTASONE) 10 MG tablet Take 4 for four days 3 for four days 2 for four days 1 for four days  . [DISCONTINUED] ampicillin (PRINCIPEN) 500 MG capsule Take 1 capsule (500 mg total) by mouth 4 (four) times daily. (Patient not taking: Reported on 09/14/2014)

## 2014-09-14 NOTE — ED Notes (Signed)
Report called to Blanch Media on 7N at [facility].

## 2014-09-14 NOTE — Assessment & Plan Note (Signed)
Gold D copd with progressive decline in lung function

## 2014-09-14 NOTE — Telephone Encounter (Signed)
Opened in error

## 2014-09-14 NOTE — ED Notes (Signed)
After pt finished breathing treatment, pt stated "I feel better now, I want to go home." Pt ambulated from stretcher to wheelchair and "became short of breath" again. Pt sts "I'm so out of breath and weak when I get up."  MD Delo made aware. MD at bedside to talk with patient.

## 2014-09-14 NOTE — Patient Instructions (Addendum)
A CT Angio of the chest will be obtained Venous doppler of leg will be obtained Prednisone 10mg  Take 4 for four days 3 for four days 2 for four days 1 for four days (sent to downstairs pharmacy) No other changes Return 1 week [facility] see tammy parrett

## 2014-09-14 NOTE — Progress Notes (Signed)
Quick Note:  lmomtcb for pt on home # ATC cell # - went directly to VM. lmomtcb ______

## 2014-09-14 NOTE — ED Notes (Signed)
Pt was getting a CT angio of chest performed for PMD upstairs when she became extremely SHOB. Pt labored. Wears O2 at home.

## 2014-09-14 NOTE — Telephone Encounter (Signed)
Spoke with Dr. Estill Dooms.  He reports that they have found not cause for pt's intermittent gross hematuria besides stones. He does not wish to do anything invasive for stones due to her COPD unless absolutely necessary. Pt is bothered by the hematuria.  Dr. Estill Dooms wonders if perhaps the follow up US for DVT could be moved up sooner so pt could come off of xarelto.

## 2014-09-15 ENCOUNTER — Telehealth: Payer: Self-pay | Admitting: Critical Care Medicine

## 2014-09-15 ENCOUNTER — Telehealth: Payer: Self-pay | Admitting: Hematology & Oncology

## 2014-09-15 NOTE — Telephone Encounter (Signed)
Please call pt's cell phone if needed to contact her as she is admitted.  Thanks.

## 2014-09-15 NOTE — Telephone Encounter (Signed)
Per in basket note I called pt to schedule appointment. She is in the hospital and will call to schedule when she gets out. Pt is confused about 12-21 doppler. I told her she needs to call Dr. Ileana Roup office about that. She wants to do it here but someone told her she couldn't.

## 2014-09-15 NOTE — Telephone Encounter (Signed)
This can be done at [place] [facility]

## 2014-09-15 NOTE — Telephone Encounter (Signed)
Result Note     Let pt know NO PE on scan just severe emphysema.  No pneumonia  ---  I spoke with patient about results and she verbalized understanding. After she left OV w/ Dr. Joya Gaskins yesterday, she went to [facility] and now is admitted. FYI for Dr. Joya Gaskins

## 2014-09-15 NOTE — Telephone Encounter (Signed)
Spoke with pt, is admitted at [facility] currently in room 779- is upset that her doppler scan has been scheduled for 12/21 at [address].  She states she was told by PW that this could be done at [place].   Pcc's please advise if this can be scheduled at [facility] medcenter, or PW if this can be performed at [facility] since she is already admitted.  Thank you.

## 2014-09-15 NOTE — Telephone Encounter (Signed)
Called spoke with pt. She cancelled doppler appt scheduled at [address], She wants this to be scheduled at [facility] med center. Please advise PCC's thanks

## 2014-09-15 NOTE — Telephone Encounter (Signed)
I'm sure we can get her in sooner I'll have Rick reschedule her. Thanks!

## 2014-09-18 ENCOUNTER — Ambulatory Visit (INDEPENDENT_AMBULATORY_CARE_PROVIDER_SITE_OTHER): Payer: Medicare Other | Admitting: Family

## 2014-09-18 ENCOUNTER — Encounter (HOSPITAL_COMMUNITY): Payer: Medicare Other

## 2014-09-18 ENCOUNTER — Ambulatory Visit: Payer: Medicare Other | Admitting: Family

## 2014-09-18 ENCOUNTER — Other Ambulatory Visit: Payer: Self-pay | Admitting: Critical Care Medicine

## 2014-09-18 ENCOUNTER — Telehealth: Payer: Self-pay | Admitting: Family

## 2014-09-18 ENCOUNTER — Encounter: Payer: Self-pay | Admitting: Family

## 2014-09-18 VITALS — BP 110/80 | HR 76 | Temp 97.8°F | Resp 16 | Ht 65.25 in | Wt 111.8 lb

## 2014-09-18 DIAGNOSIS — J438 Other emphysema: Secondary | ICD-10-CM

## 2014-09-18 DIAGNOSIS — I82401 Acute embolism and thrombosis of unspecified deep veins of right lower extremity: Secondary | ICD-10-CM

## 2014-09-18 DIAGNOSIS — R319 Hematuria, unspecified: Secondary | ICD-10-CM

## 2014-09-18 NOTE — Telephone Encounter (Signed)
Hi Tammy, this pt is scheduled to see you 12/23- was just d/c'd from [facility] for COPD exacerbation. She is unable to get transportation to [place], could you please help to get her set up with you or one of the providers her in [place]? thanks

## 2014-09-18 NOTE — Telephone Encounter (Signed)
Pt was admitted to [facility] dopplers were cancelled per dr wright's order pt is out of hosp now and wants to go ahead and do them [place] does not do them she will have to go to [place] know what [facility] wants to do Joellen [place]

## 2014-09-18 NOTE — Assessment & Plan Note (Signed)
Intermittent, GU work up neg for malignancy, + stones which are felt to be cause of hematuria- management per urology.

## 2014-09-18 NOTE — Telephone Encounter (Signed)
Pt has been scheduled@[facility]  09/19/14@10 :30am Holly Page

## 2014-09-18 NOTE — Progress Notes (Signed)
Pre visit review using our clinic review tool, if applicable. No additional management support is needed unless otherwise documented below in the visit note. 

## 2014-09-18 NOTE — Assessment & Plan Note (Signed)
Hx of recurrent DVT on xarelto. Dr. Estill Dooms wants to avoid invasive measures for stones and is hoping that we may be able to stop xarelto or place ivc filter.  Pt has follow up with hematology and will defer this to their team.

## 2014-09-18 NOTE — Assessment & Plan Note (Signed)
Recent exacerbation with hospitalization. Clinically improving. Will try to arrange pulm apt here in [place] as she cannot get to [place].  Advised pt to continue nebs, pred taper, pick up abx prescribed at discharge. Records requested from [place] [facility].

## 2014-09-18 NOTE — Telephone Encounter (Signed)
Try to get them done in the [facility] regional system

## 2014-09-18 NOTE — Patient Instructions (Signed)
Please continue prednisone taper, oxygen, start antibiotic which was sent to your pharmacy from [facility]. We will try to arrange a pulmonology appointment for you at [facility]. Keep your upcoming appointment with Dr. Genelle Gather NP. Follow up in 6 weeks here.

## 2014-09-18 NOTE — Progress Notes (Signed)
Subjective:    Patient ID: Holly Page, female    DOB: 27-Sep-1936, 78 y.o.   MRN: [id number]  HPI  Holly Page is a 78 yr old female who presents today for follow up. She was seen by Dr. Joya Gaskins on 12/17.  She was noted to have acute COPD exacerbation and was given prednisone taper. A CTA chest was also performed on 12/17 which was negative for PE. She became very weak while in the ED and was tranfered to [facility] ED. Was discharged yesterday.  Reports that she was treated for Acute COPD exacerbation. She was admitted to [facility]. She was discharged on prednisone. She reports that they wrote rx for antibiotic   Hematuria- she is following with Dr. Estill Dooms- (see 12/17 phone note).  Pt with ongoing intermittent gross hematuria in the setting of stones and xarelto. She has DVT R posterior thigh and apparently had a DVT in the 1980's as well.  Hematology plans to repeat her [place] in 3 months with is February.  I spoke with hematology and they are trying to move the patient's appointment up sooner given her recent gross hematuria. Reports that her last episode of hematuria was while she was at the hospital.    Spinal Stenosis- this is followed by Dr. Christella Noa.  Work up has been placed on hold given recent health issues.   Review of Systems See HPI  Past Medical History  Diagnosis Date  . Pneumonia   . COPD (chronic obstructive pulmonary disease)   . Hypertension   . DVT (deep venous thrombosis) 1980s, recurrent 2015    Right DVT 2015  on xarelto  . GERD (gastroesophageal reflux disease)   . Lactose intolerance   . Spinal stenosis   . Meningioma     followed by Dr Christella Noa    History   Social History  . Marital Status: Divorced    Spouse Name: N/A    Number of Children: 66  . Years of Education: college   Occupational History  . Retired    Social History Main Topics  . Smoking status: Former Smoker -- 1.50 packs/day for 56 years    Types: Cigarettes    Quit date: 04/29/2012  .  Smokeless tobacco: Never Used  . Alcohol Use: No     Comment: quit drinking beer 2005  . Drug Use: No  . Sexual Activity: Not on file   Other Topics Concern  . Not on file   Social History Narrative   Patient lives at home alone- divorced, no pets.     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 Foot surgery    . Cholecystectomy    . Tonsillectomy    . Tubal ligation      Family History  Problem Relation Age of Onset  . Lupus Sister   . COPD Father     smoker deceased.   [name] Heart attack Son     Allergies  Allergen Reactions  . Tramadol Shortness Of Breath  . Amlodipine     Other reaction(s): SWELLING  . Aspirin     Other reaction(s): PALPITATIONS Other reaction(s): DIFFICULTY BREATHING Heart flutter  .  Codeine Other (See Comments)    hallucinations   . Doxycycline     Other reaction(s): NAUSEA,VOMITING  . Hydrocodone     hallucinations hallucinations  . Losartan   . Propoxyphene     Other reaction(s): NAUSEA  . Tiotropium     Current Outpatient Prescriptions on File Prior to Visit  Medication Sig Dispense Refill  . acetaminophen (TYLENOL) 325 MG tablet Take 2 tablets (650 mg total) by mouth every 6 (six) hours as needed. 15 tablet 0  . albuterol (PROVENTIL HFA;VENTOLIN HFA) 108 (90 BASE) MCG/ACT inhaler Inhale 2 puffs into the lungs every 6 (six) hours as needed for wheezing or shortness of breath. 1 Inhaler 3  . albuterol (PROVENTIL) (2.5 MG/3ML) 0.083% nebulizer solution Take 2.5 mg by nebulization every 6 (six) hours as needed for shortness of breath. And as needed    . budesonide (PULMICORT) 0.25 MG/2ML nebulizer solution Take 0.25 mg by nebulization daily.    . Cholecalciferol (VITAMIN D-3) 5000 UNITS TABS Take 1  tablet by mouth daily.    [name] docusate sodium (COLACE) 100 MG capsule Take 100 mg by mouth 2 (two) times daily as needed.    . hydrochlorothiazide (MICROZIDE) 12.5 MG capsule Take 12.5 mg by mouth. Once daily    . HYDROcodone-acetaminophen (NORCO) 5-325 MG per tablet Take 1 tablet by mouth every 6 (six) hours as needed for moderate pain. 8 tablet 0  . Loperamide HCl (CVS ANTI-DIARRHEA PO) Take by mouth as needed.    [name] LYRICA 25 MG capsule Take 1 capsule by mouth. 1-2 times daily as needed    . predniSONE (DELTASONE) 10 MG tablet Take 4 for four days 3 for four days 2 for four days 1 for four days 40 tablet 0  . ranitidine (ZANTAC) 300 MG tablet Take 300 mg by mouth. 1 tablet at bedtime    . rivaroxaban (XARELTO) 20 MG TABS tablet Take 20 mg by mouth daily with supper.    [name] Umeclidinium-Vilanterol (ANORO ELLIPTA) 62.5-25 MCG/INH AEPB Inhale 1 puff into the lungs daily. 3 each 0  . verapamil (VERELAN PM) 240 MG 24 hr capsule Take 240 mg by mouth daily.     No current facility-administered medications on file prior to visit.    BP 110/80 mmHg  Pulse 76  Temp(Src) 97.8 F (36.6 C) (Oral)  Resp 16  Ht 5' 5.25" (1.657 m)  Wt 111 lb 12.8 oz (50.712 kg)  BMI 18.47 kg/m2  SpO2 96%       Objective:   Physical Exam  Constitutional: She is oriented to person, place, and time. She appears well-developed and well-nourished. No distress.  Cardiovascular: Normal rate and regular rhythm.   No murmur heard. Pulmonary/Chest: Effort normal and breath sounds normal. No respiratory distress. She has no wheezes. She has no rales. She exhibits no tenderness.  Few scattered wheezes. Diminished breath sounds throughout. Mild increased WOB is noted.   Neurological: She is alert and oriented to person, place, and time.  Skin: Skin is warm and dry.  Psychiatric: She has a normal mood and affect. Her behavior is normal. Thought content normal.          Assessment & Plan:  25 min spent with pt. >50% of  this time was spent counseling pt on her medical conditions listed above.

## 2014-09-18 NOTE — Telephone Encounter (Signed)
Dr Joya Gaskins please advise on below per [facility]. Thanks.

## 2014-09-19 ENCOUNTER — Ambulatory Visit (HOSPITAL_BASED_OUTPATIENT_CLINIC_OR_DEPARTMENT_OTHER)
Admission: RE | Admit: 2014-09-19 | Discharge: 2014-09-19 | Disposition: A | Discharge status: Home / Self Care | Payer: Medicare Other | Source: Ambulatory Visit | Attending: Family | Admitting: Family

## 2014-09-19 DIAGNOSIS — I82401 Acute embolism and thrombosis of unspecified deep veins of right lower extremity: Secondary | ICD-10-CM | POA: Insufficient documentation

## 2014-09-19 NOTE — Telephone Encounter (Signed)
Sure no problem  Jess can you look on the [place] schedule and set this lady up for AECOPD post hospital follow up    [facility]

## 2014-09-20 ENCOUNTER — Ambulatory Visit: Payer: Medicare Other | Admitting: Adult Health

## 2014-09-20 ENCOUNTER — Encounter: Payer: Self-pay | Admitting: Family

## 2014-09-20 ENCOUNTER — Ambulatory Visit (HOSPITAL_BASED_OUTPATIENT_CLINIC_OR_DEPARTMENT_OTHER): Payer: Medicare Other | Admitting: Family

## 2014-09-20 ENCOUNTER — Ambulatory Visit (HOSPITAL_BASED_OUTPATIENT_CLINIC_OR_DEPARTMENT_OTHER): Payer: Medicare Other | Admitting: Lab

## 2014-09-20 VITALS — BP 139/73 | HR 90 | Temp 98.4°F | Resp 20 | Ht 65.0 in | Wt 111.0 lb

## 2014-09-20 DIAGNOSIS — I82401 Acute embolism and thrombosis of unspecified deep veins of right lower extremity: Secondary | ICD-10-CM

## 2014-09-20 LAB — CBC WITH DIFFERENTIAL (CANCER CENTER ONLY)
BASO#: 0 10*3/uL (ref 0.0–0.2)
BASO%: 0.1 % (ref 0.0–2.0)
EOS%: 0.4 % (ref 0.0–7.0)
Eosinophils Absolute: 0 10*3/uL (ref 0.0–0.5)
HCT: 40.7 % (ref 34.8–46.6)
HEMOGLOBIN: 13.2 g/dL (ref 11.6–15.9)
LYMPH#: 1.3 10*3/uL (ref 0.9–3.3)
LYMPH%: 15 % (ref 14.0–48.0)
MCH: 22.8 pg — ABNORMAL LOW (ref 26.0–34.0)
MCHC: 32.4 g/dL (ref 32.0–36.0)
MCV: 70 fL — AB (ref 81–101)
MONO#: 0.4 10*3/uL (ref 0.1–0.9)
MONO%: 4.6 % (ref 0.0–13.0)
NEUT#: 6.8 10*3/uL — ABNORMAL HIGH (ref 1.5–6.5)
NEUT%: 79.9 % (ref 39.6–80.0)
Platelets: 348 10*3/uL (ref 145–400)
RBC: 5.79 10*6/uL — ABNORMAL HIGH (ref 3.70–5.32)
RDW: 14.9 % (ref 11.1–15.7)
WBC: 8.6 10*3/uL (ref 3.9–10.0)

## 2014-09-20 NOTE — Telephone Encounter (Signed)
Pt is already scheduled to see PW in the [facility] office on 1.14.16 (PW's first date at that location) at [place].  Discussed with Clarissa, okay for pt to be seen in 50min slot for HFU as she is already established with PW.  Appt notes edited. Nothing further needed; will sign off.

## 2014-09-20 NOTE — Progress Notes (Signed)
[place]  [phone] [phone]  ID: CHRYSTIAN RESSLER OB: [birth year] MR#: [id number]#:[id number] Patient Care Team: Debbrah Alar, NP as PCP - General (Internal Medicine) Elsie Stain, MD as Consulting Physician (Pulmonary Disease)  DIAGNOSIS: DVT right posterior thigh  INTERVAL HISTORY: Holly Page is here today for a follow-up. She was released from the hospital yesterday after having an exacerbation of COPD. She on prednisone now and feeling much better. While she was in the hospital her urine was positive for blood. Her Hgb is 13.2 and she has had no more bleeding. Her urologist is concerned about her bleeding during a procedure to get rid of her kidney stones.    She denies fever, chills, n/v, cough, rash, headache, dizziness, chest pain, palpitations, abdominal pain, constipation, diarrhea, blood in stool. No bleeding or pain. The swelling in her right thigh has gone down. She denies tenderness, numbness or tingling in her extremities.  Her appetite is good and she is staying hydrated. Her weight is stable.  She wears O2 2L [place] as needed. She has COPD and becomes SOB with exertion at times.   CURRENT TREATMENT: Xarelto 20 mg daily  REVIEW OF SYSTEMS: All other 10 point review of systems is negative.   PAST MEDICAL HISTORY: Past Medical History  Diagnosis Date  . Pneumonia   . COPD (chronic obstructive pulmonary disease)   . Hypertension   . DVT (deep venous thrombosis) 1980s, recurrent 2015    Right DVT 2015  on xarelto  . GERD (gastroesophageal reflux disease)   . Lactose intolerance   . Spinal stenosis   . Meningioma     followed by Dr Christella Noa    PAST SURGICAL HISTORY: Past Surgical History  Procedure Laterality Date  . Foot surgery    . Cholecystectomy    . Tonsillectomy    . Tubal ligation      FAMILY HISTORY Family History  Problem Relation Age of Onset  . Lupus Sister   . COPD Father     smoker  deceased.   [name] Heart attack Son     GYNECOLOGIC HISTORY:  No LMP recorded. Patient is postmenopausal.   SOCIAL HISTORY: History   Social History  . Marital Status: Divorced    Spouse Name: N/A    Number of Children: 6  . Years of Education: college   Occupational History  . Retired    Social History Main Topics  . Smoking status: Former Smoker -- 1.50 packs/day for 56 years    Types: Cigarettes    Quit date: 04/29/2012  . Smokeless tobacco: Never Used  . Alcohol Use: No     Comment: quit drinking beer 2005  . Drug Use: No  . Sexual Activity: Not on file   Other Topics Concern  . Not on file   Social History Narrative   Patient lives at home alone- divorced, no pets.     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 Smoking status: Former Smoker -- 1.50 packs/day for 56 years    Types: Cigarettes    Quit date: 04/29/2012  . Smokeless tobacco: Never Used  .  Alcohol Use: No     Comment: quit drinking beer 2005   Colonoscopy: PAP: Bone density: Lipid panel:  Allergies  Allergen Reactions  . Tramadol Shortness Of Breath  . Amlodipine     Other reaction(s): SWELLING  . Aspirin     Other reaction(s): PALPITATIONS Other reaction(s): DIFFICULTY BREATHING Heart flutter  . Codeine Other (See Comments)    hallucinations   . Doxycycline     Other reaction(s): NAUSEA,VOMITING  . Hydrocodone     hallucinations hallucinations  . Losartan   . Propoxyphene     Other reaction(s): NAUSEA  . Tiotropium     Current Outpatient Prescriptions  Medication Sig Dispense Refill  . acetaminophen (TYLENOL) 325 MG tablet Take 2 tablets (650 mg total) by mouth every 6 (six) hours as needed. 15  tablet 0  . albuterol (PROVENTIL HFA;VENTOLIN HFA) 108 (90 BASE) MCG/ACT inhaler Inhale 2 puffs into the lungs every 6 (six) hours as needed for wheezing or shortness of breath. 1 Inhaler 3  . albuterol (PROVENTIL) (2.5 MG/3ML) 0.083% nebulizer solution Take 2.5 mg by nebulization every 6 (six) hours as needed for shortness of breath. And as needed    . budesonide (PULMICORT) 0.25 MG/2ML nebulizer solution Take 0.25 mg by nebulization daily.    . budesonide (PULMICORT) 0.25 MG/2ML nebulizer solution INHALE 1 VIAL VIA NEBULIZER DAILY 60 mL 6  . Cholecalciferol (VITAMIN D-3) 5000 UNITS TABS Take 1 tablet by mouth daily.    [name] docusate sodium (COLACE) 100 MG capsule Take 100 mg by mouth 2 (two) times daily as needed.    . hydrochlorothiazide (MICROZIDE) 12.5 MG capsule Take 12.5 mg by mouth. Once daily    . HYDROcodone-acetaminophen (NORCO) 5-325 MG per tablet Take 1 tablet by mouth every 6 (six) hours as needed for moderate pain. 8 tablet 0  . Loperamide HCl ([facility]S ANTI-DIARRHEA PO) Take by mouth as needed.    [name] LYRICA 25 MG capsule Take 1 capsule by mouth. 1-2 times daily as needed    . predniSONE (DELTASONE) 10 MG tablet Take 4 for four days 3 for four days 2 for four days 1 for four days 40 tablet 0  . ranitidine (ZANTAC) 300 MG tablet Take 300 mg by mouth. 1 tablet at bedtime    . rivaroxaban (XARELTO) 20 MG TABS tablet Take 20 mg by mouth daily with supper.    [name] Umeclidinium-Vilanterol (ANORO ELLIPTA) 62.5-25 MCG/INH AEPB Inhale 1 puff into the lungs daily. 3 each 0  . verapamil (VERELAN PM) 240 MG 24 hr capsule Take 240 mg by mouth daily.     No current facility-administered medications for this visit.    OBJECTIVE: There were no vitals filed for this visit. There were no vitals filed for this visit. ECOG FS:0 - Asymptomatic Ocular: Sclerae unicteric, pupils equal, round and reactive to light Ear-nose-throat: Oropharynx clear, dentition fair Lymphatic: No cervical or supraclavicular  adenopathy Lungs no rales or rhonchi, good excursion bilaterally Heart regular rate and rhythm, no murmur appreciated Abd soft, nontender, positive bowel sounds MSK no focal spinal tenderness, no joint edema Neuro: non-focal, well-oriented, appropriate affect Breasts: Deferred  LAB RESULTS: CMP     Component Value Date/Time   NA 142 07/04/2014 1230   K 3.8 07/04/2014 1230   CL 102 07/04/2014 1230   CO2 33* 07/04/2014 1230   GLUCOSE 114* 07/04/2014 1230   BUN 16 07/04/2014 1230   CREATININE 0.8 07/04/2014 1230   CALCIUM 9.7 07/04/2014 1230   GFRNONAA >90 01/29/2014  [place] >90 01/29/2014 1130   INo results found for: SPEP, UPEP Lab Results  Component Value Date   WBC 4.7 09/14/2014   NEUTROABS 2.7 08/13/2014   HGB 13.3 09/14/2014   HCT 42.3 09/14/2014   MCV 74.0* 09/14/2014   PLT 277 09/14/2014   No results found for: LABCA2 No components found for: [id number] No results for input(s): INR in the last 168 hours.  STUDIES:  ASSESSMENT/PLAN: Ms. Lope is a very pleasant 78 yo African American female with a right posterior thigh DVT. She had a DVT in the 1980's but she can't remember which leg it was in.  She has been on Xarelto since early September. While she was in the hospital this week her urine tested positive for blood. She has not noticed blood in her urine since being back home. Her Hgb today is 13.2.  After speaking with Dr. Marin Olp, he does not feel that we need to stop her Xarelto (since she has only been on it 3 months). He also does not feel that she needs an IVC filter.  We can have her stop her Xarelto 2 days before her procedure with urology and then restart it the day after her procedure.  We will keep her appointment in February for ultrasound, labs and follow-up.  All questions were answered. She knows to call the clinic with any problems, questions or concerns. We can certainly see her much sooner if necessary.  Eliezer Bottom, NP 09/20/2014  11:52 AM

## 2014-09-26 ENCOUNTER — Ambulatory Visit: Payer: Medicare Other | Admitting: Medical

## 2014-09-26 ENCOUNTER — Telehealth: Payer: Self-pay | Admitting: *Deleted

## 2014-09-26 NOTE — Telephone Encounter (Signed)
Pt did not show for appointment 09/26/2014 at 10:30am for "hosptial follow up from [place]"

## 2014-09-28 ENCOUNTER — Telehealth: Payer: Self-pay | Admitting: Critical Care Medicine

## 2014-09-28 ENCOUNTER — Other Ambulatory Visit: Payer: Self-pay | Admitting: Critical Care Medicine

## 2014-09-28 MED ORDER — BUDESONIDE 0.25 MG/2ML IN SUSP
RESPIRATORY_TRACT | Status: DC
Start: 2014-09-28 — End: 2014-10-09

## 2014-09-28 NOTE — Telephone Encounter (Signed)
Called and spoke with pt and she is aware of refills that have been sent to her pharmacy.  Nothing further is needed.  

## 2014-10-09 ENCOUNTER — Encounter: Payer: Self-pay | Admitting: Family

## 2014-10-09 ENCOUNTER — Ambulatory Visit (INDEPENDENT_AMBULATORY_CARE_PROVIDER_SITE_OTHER): Payer: Medicare Other | Admitting: Family

## 2014-10-09 VITALS — BP 118/80 | HR 79 | Temp 97.6°F | Resp 16 | Ht 65.25 in | Wt 111.0 lb

## 2014-10-09 DIAGNOSIS — R319 Hematuria, unspecified: Secondary | ICD-10-CM

## 2014-10-09 DIAGNOSIS — I82401 Acute embolism and thrombosis of unspecified deep veins of right lower extremity: Secondary | ICD-10-CM

## 2014-10-09 DIAGNOSIS — J438 Other emphysema: Secondary | ICD-10-CM

## 2014-10-09 MED ORDER — RIVAROXABAN 20 MG PO TABS: 20.0000 mg | ORAL_TABLET | Freq: Every day | ORAL | Status: DC

## 2014-10-09 NOTE — Assessment & Plan Note (Signed)
Continue xarelto. Rx given. Long-term management per hematology.

## 2014-10-09 NOTE — Progress Notes (Signed)
Subjective:    Patient ID: Holly Page, female    DOB: August 29, 1936, 79 y.o.   MRN: [id number]  HPI Holly Page is here today for follow up.  1. Hx of DVT right femoral vein: Taking xarelto 20 mg daily for hx of DVT. States she needs signed prescription for the xarelto in order to get free sample.   2. COPD: Hospitalized for recent COPD exacerbation (09/14/2014-09/17/2014). Complying with anoro ellipta inhaler, pulmicort and albuterol nebs, and albuterol rescue inhaler and feeling "pretty good". Using rescue inhaler about once daily down from 2-3 times per day. She has also reduced the number of times she is using albuterol neb from 4x/day to 2x/day.She feels breathing has improved with anora. Uses O2 at 2l via [place] as needed. Wt Readings from Last 3 Encounters:  10/09/14 111 lb (50.349 kg)  09/20/14 111 lb (50.349 kg)  09/18/14 111 lb 12.8 oz (50.712 kg)  Has follow up with Dr. Joya Gaskins on 1/14.  3. Hematuria: She reports blood sporadically in urine. Saw some blood in urine last night. It is clear today. Denies continuous gross hematuria. She is anxious about the hematuria.    Review of Systems  Constitutional: Negative for fever, chills and fatigue.  Respiratory: Positive for cough (Occasional cough.). Negative for wheezing.   Cardiovascular: Negative for leg swelling.       Reports palpitations with anxiety.  Musculoskeletal:       Reports intermittent pain in right thigh.  Psychiatric/Behavioral:       Denies depression. States she prays and "trusts in the [place]".  Denies redness, warmth right thigh.  Past Medical History  Diagnosis Date  . Pneumonia   . COPD (chronic obstructive pulmonary disease)   . Hypertension   . DVT (deep venous thrombosis) 1980s, recurrent 2015    Right DVT 2015  on xarelto  . GERD (gastroesophageal reflux disease)   . Lactose intolerance   . Spinal stenosis   . Meningioma     followed by Dr Christella Noa    History   Social History  .  Marital Status: Divorced    Spouse Name: N/A    Number of Children: 67  . Years of Education: college   Occupational History  . Retired    Social History Main Topics  . Smoking status: Former Smoker -- 1.50 packs/day for 56 years    Types: Cigarettes    Quit date: 04/29/2012  . Smokeless tobacco: Never Used  . Alcohol Use: No     Comment: quit drinking beer 2005  . Drug Use: No  . Sexual Activity: Not on file   Other Topics Concern  . Not on file   Social History Narrative   Patient lives at home alone- divorced, no pets.     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 Foot surgery    . Cholecystectomy    . Tonsillectomy    . Tubal ligation      Family History  Problem Relation Age of Onset  . Lupus Sister   . COPD Father     smoker deceased.   [name] Heart attack Son     Allergies  Allergen Reactions  . Tramadol Shortness Of Breath  . Amlodipine     Other reaction(s): SWELLING  . Aspirin     Other reaction(s): PALPITATIONS Other reaction(s): DIFFICULTY BREATHING Heart flutter  . Codeine Other (See Comments)    hallucinations   . Doxycycline     Other reaction(s): NAUSEA,VOMITING  . Hydrocodone     hallucinations hallucinations  . Losartan   . Propoxyphene     Other reaction(s): NAUSEA  . Tiotropium     Current Outpatient Prescriptions on File Prior to Visit  Medication Sig Dispense Refill  . acetaminophen (TYLENOL) 325 MG tablet Take 2 tablets (650 mg total) by mouth every 6 (six) hours as needed. 15 tablet 0  . albuterol (PROVENTIL HFA;VENTOLIN HFA) 108 (90 BASE) MCG/ACT inhaler Inhale 2 puffs into the lungs every 6 (six) hours as needed for wheezing or shortness of breath. 1 Inhaler 3  . albuterol (PROVENTIL) (2.5  MG/3ML) 0.083% nebulizer solution Take 2.5 mg by nebulization every 6 (six) hours as needed for shortness of breath. And as needed    . budesonide (PULMICORT) 0.25 MG/2ML nebulizer solution INHALE 1 VIAL VIA NEBULIZER DAILY 60 mL 6  . Cholecalciferol (VITAMIN D-3) 5000 UNITS TABS Take 1 tablet by mouth daily.    [name] docusate sodium (COLACE) 100 MG capsule Take 100 mg by mouth 2 (two) times daily as needed.    . hydrochlorothiazide (MICROZIDE) 12.5 MG capsule Take 12.5 mg by mouth. Once daily    . HYDROcodone-acetaminophen (NORCO) 5-325 MG per tablet Take 1 tablet by mouth every 6 (six) hours as needed for moderate pain. 8 tablet 0  . Loperamide HCl ([facility]S ANTI-DIARRHEA PO) Take by mouth as needed.    [name] LYRICA 25 MG capsule Take 1 capsule by mouth. 1-2 times daily as needed    . ranitidine (ZANTAC) 300 MG tablet Take 300 mg by mouth. 1 tablet at bedtime    . Umeclidinium-Vilanterol (ANORO ELLIPTA) 62.5-25 MCG/INH AEPB Inhale 1 puff into the lungs daily. 3 each 0  . verapamil (VERELAN PM) 240 MG 24 hr capsule Take 240 mg by mouth daily.     No current facility-administered medications on file prior to visit.    BP 118/80 mmHg  Pulse 79  Temp(Src) 97.6 F (36.4 C) (Oral)  Resp 16  Ht 5' 5.25" (1.657 m)  Wt 111 lb (50.349 kg)  BMI 18.34 kg/m2  SpO2 97%       Objective:   Physical Exam  Constitutional: She is oriented to person, place, and time. She appears well-developed and well-nourished. No distress.  Cardiovascular: Normal rate, regular rhythm and normal heart sounds.  Exam reveals no gallop and no friction rub.   No murmur heard. Pulmonary/Chest: No respiratory distress. She has no wheezes. She has no rales.  Diminished breath sounds throughout lung fields.   Abdominal: Soft. She exhibits no distension. There is no tenderness. There is no guarding.  Musculoskeletal: She exhibits no edema.  Lymphadenopathy:    She has no cervical adenopathy.  Neurological: She is alert and oriented  to person, place, and time.  Skin: Skin is warm and dry. She is not diaphoretic.  Psychiatric: She has a normal mood and affect. Her behavior is normal. Judgment and thought content normal.          Assessment & Plan:  Patient seen along with [facility] NP-student.  I have personally seen and examined patient and agree with Ms. Whitmire's assessment and plan- Debbrah Alar NP

## 2014-10-09 NOTE — Progress Notes (Signed)
Pre visit review using our clinic review tool, if applicable. No additional management support is needed unless otherwise documented below in the visit note. 

## 2014-10-09 NOTE — Assessment & Plan Note (Signed)
Pt reports reduced use of albuterol rescue inhaler and albuterol neb. Doing well with Anoro Ellipta. Follow up with Dr. Joya Gaskins 10/12/2014.

## 2014-10-09 NOTE — Patient Instructions (Signed)
Please keep your upcoming appointment with Dr. Joya Gaskins. Contact Dr. Lorinda Creed office to arrange a follow up appointment with him. Go to ER if you develop multiple cherry red episodes of urination in a row. If you urine is intermittently pink- that is OK. Follow up with [place] in 3 months.

## 2014-10-09 NOTE — Assessment & Plan Note (Signed)
Reports intermittent hematuria. Instructed her to follow up with urology. Advised patient to go to ED if she develops multiple cherry red episodes of urination in a row. Discussed that while the hematuria is worrisome to her, it has been proven not to be cancer and the amount of bleeding is benign. Follow up with [place] in 3 months.

## 2014-10-11 ENCOUNTER — Ambulatory Visit: Payer: Medicare Other | Admitting: Critical Care Medicine

## 2014-10-11 ENCOUNTER — Ambulatory Visit: Payer: Medicare Other | Admitting: Family

## 2014-10-12 ENCOUNTER — Encounter: Payer: Self-pay | Admitting: Critical Care Medicine

## 2014-10-12 ENCOUNTER — Ambulatory Visit (INDEPENDENT_AMBULATORY_CARE_PROVIDER_SITE_OTHER): Payer: Medicare Other | Admitting: Critical Care Medicine

## 2014-10-12 VITALS — BP 164/81 | HR 85 | Temp 97.8°F | Ht 65.0 in | Wt 111.0 lb

## 2014-10-12 DIAGNOSIS — I82401 Acute embolism and thrombosis of unspecified deep veins of right lower extremity: Secondary | ICD-10-CM

## 2014-10-12 DIAGNOSIS — J432 Centrilobular emphysema: Secondary | ICD-10-CM

## 2014-10-12 NOTE — Patient Instructions (Signed)
No change in medications for now REturn 4 months

## 2014-10-12 NOTE — Assessment & Plan Note (Addendum)
Gold D copd with recent exacerbation,  Now improved .  Stable oxygenation.  Note hx of VTE, now no evidence for same on recent venous doppler and CT angios Plan Cont neb meds Cont xarelto Need to f/u with heme to see length of therapy for VTE. Pt not a candidate for stone extraction from renals due to severe lung disease  >50% of time spent coordinating care .    15 min encounter time

## 2014-10-12 NOTE — Progress Notes (Signed)
Subjective:    Patient ID: Holly Page, female    DOB: 1935/12/26, 79 y.o.   MRN: [id number]  HPI 79 y.o. F with Gold C Copd primary emphysema   10/12/2014 Chief Complaint  Patient presents with  . Follow-up    Pt states she fells better since last OV, but states she doe snot feel like she is back to baseline. Pt states she has increased SOB, and chest tightness. Pt denies any increase in cough or congestion.   Pt in [place] before christmas.  Now is better but symptoms come and go.  No real mucus. Cough is dry. No chest pain.  Pt notes some tightness. Rx ABX and Pred and now is finished.  Not much edema.  CT angio chest and venous doppler u/s Neg for VTE.   Hx of renal stones and urology does not want to perform any procedures.   Dyspnea now at baseline Pt denies any significant sore throat, nasal congestion or excess secretions, fever, chills, sweats, unintended weight loss, pleurtic or exertional chest pain, orthopnea PND, or leg swelling Pt denies any increase in rescue therapy over baseline, denies waking up needing it or having any early am or nocturnal exacerbations of coughing/wheezing/or dyspnea. Pt also denies any obvious fluctuation in symptoms with  weather or environmental change or other alleviating or aggravating factors   Review of Systems Constitutional:   No  weight loss, night sweats,  Fevers, chills, + fatigue, or  lassitude.  HEENT:   No headaches,  Difficulty swallowing,  Tooth/dental problems, or  Sore throat,                No sneezing, itching, ear ache, nasal congestion, post nasal drip,   CV:  No chest pain,  Orthopnea, PND, swelling in lower extremities, anasarca, dizziness, palpitations, syncope.   GI  Notes heartburn, no indigestion, abdominal pain, nausea, vomiting, diarrhea, change in bowel habits, loss of appetite, bloody stools.   Resp:  No excess mucus, no productive cough,  Notes non-productive cough,  No coughing up of blood.  No change in  color of mucus.  No wheezing.  No chest wall deformity  Skin: no rash or lesions.  GU: no dysuria, change in color of urine, no urgency or frequency.  No flank pain, ++ hematuria   MS:  No joint pain or swelling.  No decreased range of motion.  No back pain.  Psych:  No change in mood or affect. No depression or anxiety.  No memory loss.      Objective:   Physical Exam BP 164/81 mmHg  Pulse 85  Temp(Src) 97.8 F (36.6 C) (Oral)  Ht 5\' 5"  (1.651 m)  Wt 111 lb (50.349 kg)  BMI 18.47 kg/m2  SpO2 96%  GEN: A/Ox3; pleasant , NAD, well nourished   HEENT:  /AT,  EACs-clear, TMs-wnl, NOSE-clear, THROAT-clear, no lesions, nasal inflammation with mild purulence  NECK:  Supple w/ fair ROM; no JVD; normal carotid impulses w/o bruits; no thyromegaly or nodules palpated; no lymphadenopathy.  RESP  Diminished in bases w/ no wheezing or rhonchi.no accessory muscle use, no dullness to percussion  CARD:  RRR, no m/r/g  , no peripheral edema, pulses intact, no cyanosis or clubbing.  GI:   Soft & nt; nml bowel sounds; no organomegaly or masses detected.  Musco: Warm bil, no deformities or joint swelling noted.   Neuro: alert, no focal deficits noted.    Skin: Warm, no lesions or rashes  No results  found.      Assessment & Plan:   COPD with emphysema Gold D Gold D copd with recent exacerbation,  Now improved .  Stable oxygenation.  Note hx of VTE, now no evidence for same on recent venous doppler and CT angios Plan Cont neb meds Cont xarelto Need to f/u with heme to see length of therapy for VTE. Pt not a candidate for stone extraction from renals due to severe lung disease  >50% of time spent coordinating care .    15 min encounter time   DVT (deep venous thrombosis) Hx of DVT RLE.  No recurence on recent VTE u/s.  CT angio neg of chest Length of Rx per oncology/heme.     NOTE after completion of this note , I saw where this pt was seen in [place] ED post CT Angio  and subsequently tfr to [facility] for admission.   Copd exacerbation was working DX.  Updated Medication List Outpatient Encounter Prescriptions as of 10/12/2014  Medication Sig  . acetaminophen (TYLENOL) 325 MG tablet Take 2 tablets (650 mg total) by mouth every 6 (six) hours as needed.  [name] albuterol (PROVENTIL HFA;VENTOLIN HFA) 108 (90 BASE) MCG/ACT inhaler Inhale 2 puffs into the lungs every 6 (six) hours as needed for wheezing or shortness of breath.  [name] albuterol (PROVENTIL) (2.5 MG/3ML) 0.083% nebulizer solution Take 2.5 mg by nebulization every 6 (six) hours as needed for shortness of breath. And as needed  . budesonide (PULMICORT) 0.25 MG/2ML nebulizer solution INHALE 1 VIAL VIA NEBULIZER DAILY  . Cholecalciferol (VITAMIN D-3) 5000 UNITS TABS Take 1 tablet by mouth daily.  [name] docusate sodium (COLACE) 100 MG capsule Take 100 mg by mouth 2 (two) times daily as needed.  . hydrochlorothiazide (MICROZIDE) 12.5 MG capsule Take 12.5 mg by mouth. Once daily  . Loperamide HCl ([facility]S ANTI-DIARRHEA PO) Take by mouth as needed.  [name] LYRICA 25 MG capsule Take 1 capsule by mouth. 1-2 times daily as needed  . ranitidine (ZANTAC) 300 MG tablet Take 300 mg by mouth. 1 tablet at bedtime  . rivaroxaban (XARELTO) 20 MG TABS tablet Take 1 tablet (20 mg total) by mouth daily with supper.  [name] Umeclidinium-Vilanterol (ANORO ELLIPTA) 62.5-25 MCG/INH AEPB Inhale 1 puff into the lungs daily.  . verapamil (VERELAN PM) 240 MG 24 hr capsule Take 240 mg by mouth daily.  . [DISCONTINUED] HYDROcodone-acetaminophen (NORCO) 5-325 MG per tablet Take 1 tablet by mouth every 6 (six) hours as needed for moderate pain.

## 2014-10-13 ENCOUNTER — Telehealth: Payer: Self-pay | Admitting: Family

## 2014-10-13 MED ORDER — HYDROCODONE-ACETAMINOPHEN 5-325 MG PO TABS: 1.0000 | ORAL_TABLET | Freq: Four times a day (QID) | ORAL | Status: DC | PRN

## 2014-10-13 NOTE — Telephone Encounter (Signed)
Pt reports low back pain, worse in the evenings. Reports that [facility] gave her an Rx for hydrocodone which she did tolerate. Advised pt will rx for short term only. Leave rx at front desk for her to pick up.

## 2014-10-13 NOTE — Telephone Encounter (Signed)
Rx placed at front desk and pt has been notified.

## 2014-10-13 NOTE — Telephone Encounter (Signed)
Caller name: Sadonna Relation to pt: self Call back number: [phone] Pharmacy: [facility] [address]   Reason for call:   Patient states that she is still having back problems and would like something called in. She states that she has discussed this with Melissa in the past.

## 2014-10-13 NOTE — Assessment & Plan Note (Signed)
Hx of DVT RLE.  No recurence on recent VTE u/s.  CT angio neg of chest Length of Rx per oncology/heme.

## 2014-10-17 ENCOUNTER — Encounter: Payer: Self-pay | Admitting: Family

## 2014-10-30 ENCOUNTER — Ambulatory Visit (INDEPENDENT_AMBULATORY_CARE_PROVIDER_SITE_OTHER): Payer: Medicare Other | Admitting: Family

## 2014-10-30 ENCOUNTER — Encounter: Payer: Self-pay | Admitting: Family

## 2014-10-30 VITALS — BP 130/80 | HR 74 | Temp 97.7°F | Resp 16 | Ht 65.25 in | Wt 114.0 lb

## 2014-10-30 DIAGNOSIS — M4807 Spinal stenosis, lumbosacral region: Secondary | ICD-10-CM

## 2014-10-30 DIAGNOSIS — M545 Low back pain: Secondary | ICD-10-CM

## 2014-10-30 DIAGNOSIS — M79621 Pain in right upper arm: Secondary | ICD-10-CM

## 2014-10-30 DIAGNOSIS — M25521 Pain in right elbow: Secondary | ICD-10-CM

## 2014-10-30 NOTE — Progress Notes (Signed)
Subjective:    Patient ID: Holly Page, female    DOB: 1935/10/24, 79 y.o.   MRN: [id number]  HPI   Holly Page presents here today for right arm pain and lower back pain. She was seen for pain on medial aspect of right elbow in December 2015.. Xrays negative for fracture but show mild spurring of radial head. Pt reports continued right arm pain at night (10/10). Arms is stiff in the mornings. Pain and numbness radiate up to shoulder and down to wrist..Reports hydrocodone only helps 1-2 hours. Pain is keeping her up at night.   She also has spinal stenosis in lower back and reports pain that radiates down both legs rt>lt. Denies numbness/tingling. It starts when she gets up in the am and then improves. Worsens again at dinner time after she has been on her feet. She is established with Holly Page (neurosurgery) but does not wish to follow up with him because she is not a good surgical candidate.    Review of Systems  Constitutional: Positive for fatigue. Negative for fever.       Past Medical History  Diagnosis Date  . Pneumonia   . COPD (chronic obstructive pulmonary disease)   . Hypertension   . DVT (deep venous thrombosis) 1980s, recurrent 2015    Right DVT 2015  on xarelto  . GERD (gastroesophageal reflux disease)   . Lactose intolerance   . Spinal stenosis   . Meningioma     followed by Dr Christella Page    History   Social History  . Marital Status: Divorced    Spouse Name: N/A    Number of Children: 79  . Years of Education: college   Occupational History  . Retired    Social History Main Topics  . Smoking status: Former Smoker -- 1.50 packs/day for 56 years    Types: Cigarettes    Quit date: 04/29/2012  . Smokeless tobacco: Never Used  . Alcohol Use: No     Comment: quit drinking beer 2005  . Drug Use: No  . Sexual Activity: Not on file   Other Topics Concern  . Not on file   Social History Narrative   Patient lives at home alone- divorced, no pets.       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 Foot surgery    . Cholecystectomy    . Tonsillectomy    . Tubal ligation      Family History  Problem Relation Age of Onset  . Lupus Sister   . COPD Father     smoker deceased.   [name] Heart attack Son     Allergies  Allergen Reactions  . Tramadol Shortness Of Breath  . Amlodipine     Other reaction(s): SWELLING  . Aspirin     Other reaction(s): PALPITATIONS Other reaction(s): DIFFICULTY BREATHING Heart flutter  . Codeine Other (See Comments)    hallucinations   . Doxycycline     Other reaction(s): NAUSEA,VOMITING  . Hydrocodone     hallucinations hallucinations  . Losartan   . Propoxyphene     Other reaction(s): NAUSEA  . Tiotropium     Current  Outpatient Prescriptions on File Prior to Visit  Medication Sig Dispense Refill  . acetaminophen (TYLENOL) 325 MG tablet Take 2 tablets (650 mg total) by mouth every 6 (six) hours as needed. 15 tablet 0  . albuterol (PROVENTIL HFA;VENTOLIN HFA) 108 (90 BASE) MCG/ACT inhaler Inhale 2 puffs into the lungs every 6 (six) hours as needed for wheezing or shortness of breath. 1 Inhaler 3  . albuterol (PROVENTIL) (2.5 MG/3ML) 0.083% nebulizer solution Take 2.5 mg by nebulization every 6 (six) hours as needed for shortness of breath. And as needed    . budesonide (PULMICORT) 0.25 MG/2ML nebulizer solution INHALE 1 VIAL VIA NEBULIZER DAILY 60 mL 6  . Cholecalciferol (VITAMIN D-3) 5000 UNITS TABS Take 1 tablet by mouth daily.    [name] docusate sodium (COLACE) 100 MG capsule Take 100 mg by mouth 2 (two) times daily as needed.    . hydrochlorothiazide (MICROZIDE) 12.5 MG capsule Take 12.5 mg by mouth. Once daily    . HYDROcodone-acetaminophen (NORCO/VICODIN) 5-325  MG per tablet Take 1 tablet by mouth every 6 (six) hours as needed for moderate pain. 20 tablet 0  . Loperamide HCl ([facility] ANTI-DIARRHEA PO) Take by mouth as needed.    [name] LYRICA 25 MG capsule Take 1 capsule by mouth. 1-2 times daily as needed    . ranitidine (ZANTAC) 300 MG tablet Take 300 mg by mouth. 1 tablet at bedtime    . rivaroxaban (XARELTO) 20 MG TABS tablet Take 1 tablet (20 mg total) by mouth daily with supper. 30 tablet 0  . Umeclidinium-Vilanterol (ANORO ELLIPTA) 62.5-25 MCG/INH AEPB Inhale 1 puff into the lungs daily. 3 each 0  . verapamil (VERELAN PM) 240 MG 24 hr capsule Take 240 mg by mouth daily.     No current facility-administered medications on file prior to visit.    BP 130/80 mmHg  Pulse 74  Temp(Src) 97.7 F (36.5 C) (Oral)  Resp 16  Ht 5' 5.25" (1.657 m)  Wt 114 lb (51.71 kg)  BMI 18.83 kg/m2  SpO2 97%    Objective:   Physical Exam  Constitutional: She is oriented to person, place, and time. No distress.  Cardiovascular: Normal rate, regular rhythm and normal heart sounds.   Pulmonary/Chest: Breath sounds normal. No respiratory distress. She has no wheezes. She has no rales.  On O2.   Musculoskeletal:  Mild swelling noted medial aspect of right elbow. Painful on palpation. Normal ROM of elbow. Negative Tinel and Phalen test.  Neurological: She is alert and oriented to person, place, and time.  Strength 5/5 all extremities.  Skin: Skin is warm and dry. She is not diaphoretic.  Psychiatric: She has a normal mood and affect. Her behavior is normal. Judgment and thought content normal.          Assessment & Plan:  Patient seen along with [facility] NP-student.  I have personally seen and examined patient and agree with Holly Page's assessment and plan- of note she had a RLE doppler performed in December by Hematology which was negative for DVT.  She is maintained on xarelto per hematology and has a follow up appointment scheduled later this month.   Debbrah Alar NP

## 2014-10-30 NOTE — Assessment & Plan Note (Addendum)
Pt not a good surgical candidate and does not want to see neurosurgeon again. Refer to pain management. Continue prn hydrocodone for now.

## 2014-10-30 NOTE — Assessment & Plan Note (Addendum)
Refer to sports medicine for evaluation and tx.

## 2014-10-30 NOTE — Progress Notes (Signed)
Pre visit review using our clinic review tool, if applicable. No additional management support is needed unless otherwise documented below in the visit note. 

## 2014-10-30 NOTE — Patient Instructions (Signed)
Continue your current meds. You will be contacted about your referral to pain management and Dr. Barbaraann Barthel (for elbow pain). Follow up in 3 months.

## 2014-11-02 ENCOUNTER — Encounter: Payer: Self-pay | Admitting: Family Medicine

## 2014-11-02 ENCOUNTER — Ambulatory Visit (INDEPENDENT_AMBULATORY_CARE_PROVIDER_SITE_OTHER): Payer: Medicare Other | Admitting: Family Medicine

## 2014-11-02 VITALS — BP 154/80 | HR 86 | Ht 65.0 in | Wt 111.0 lb

## 2014-11-02 DIAGNOSIS — M79621 Pain in right upper arm: Secondary | ICD-10-CM

## 2014-11-02 DIAGNOSIS — M25521 Pain in right elbow: Secondary | ICD-10-CM

## 2014-11-02 NOTE — Patient Instructions (Signed)
You have medial epicondylitis (golfer's elbow) Avoid painful activities as much as possible (unless doing home exercises). Tylenol or aleve as needed for pain. Heat 3-4 times a day 15 minutes at a time. Start physical therapy and do home exercises on days you don't go to therapy. These home exercises may include: Strengthening with 1 pound weight pronation/supination, wrist flexion, stretching exercises (hold stretches 20-30 seconds, exercises 15 of each - do 3 sets of stretches/exercises). Elbow sleeve is helpful to unload area of pain while it heals - wear as often as you can during the day - most people find it difficult to wear when they sleep. Consider formal PT if not improving with home exercises. Can consider cortisone injection into area of pain. Topical nitro an option for this as well if you do not improve. Follow up with me in 6 weeks.

## 2014-11-06 NOTE — Assessment & Plan Note (Signed)
patient's history and exam fit with a mild medial epicondylitis.  Start home exercises daily.  Tylenol or nsaids as needed.  Heat for spasms.  Start physical therapy as well.  Elbow sleeve for compression.  Reassess in 6 weeks.

## 2014-11-06 NOTE — Progress Notes (Signed)
PCP and referred by: Nance Pear., NP  Subjective:   HPI: Patient is a 79 y.o. female here for right elbow pain.  Patient reports she's had right elbow pain for about 6-7 months. No known injury or trauma. Feels more swollen than the left. Pain radiates into forearm and up upper arm as well. Up to 10/10 level at night. Is right handed. Pain worse when lifting items. No numbness/tingling. Has not tried anything besides pain medication for this.  Past Medical History  Diagnosis Date  . Pneumonia   . COPD (chronic obstructive pulmonary disease)   . Hypertension   . DVT (deep venous thrombosis) 1980s, recurrent 2015    Right DVT 2015  on xarelto  . GERD (gastroesophageal reflux disease)   . Lactose intolerance   . Spinal stenosis   . Meningioma     followed by Dr Christella Noa    Current Outpatient Prescriptions on File Prior to Visit  Medication Sig Dispense Refill  . acetaminophen (TYLENOL) 325 MG tablet Take 2 tablets (650 mg total) by mouth every 6 (six) hours as needed. 15 tablet 0  . albuterol (PROVENTIL HFA;VENTOLIN HFA) 108 (90 BASE) MCG/ACT inhaler Inhale 2 puffs into the lungs every 6 (six) hours as needed for wheezing or shortness of breath. 1 Inhaler 3  . albuterol (PROVENTIL) (2.5 MG/3ML) 0.083% nebulizer solution Take 2.5 mg by nebulization every 6 (six) hours as needed for shortness of breath. And as needed    . budesonide (PULMICORT) 0.25 MG/2ML nebulizer solution INHALE 1 VIAL VIA NEBULIZER DAILY 60 mL 6  . Cholecalciferol (VITAMIN D-3) 5000 UNITS TABS Take 1 tablet by mouth daily.    [name] docusate sodium (COLACE) 100 MG capsule Take 100 mg by mouth 2 (two) times daily as needed.    . hydrochlorothiazide (MICROZIDE) 12.5 MG capsule Take 12.5 mg by mouth. Once daily    . HYDROcodone-acetaminophen (NORCO/VICODIN) 5-325 MG per tablet Take 1 tablet by mouth every 6 (six) hours as needed for moderate pain. 20 tablet 0  . Loperamide HCl ([facility] ANTI-DIARRHEA PO) Take by  mouth as needed.    [name] LYRICA 25 MG capsule Take 1 capsule by mouth. 1-2 times daily as needed    . ranitidine (ZANTAC) 300 MG tablet Take 300 mg by mouth. 1 tablet at bedtime    . rivaroxaban (XARELTO) 20 MG TABS tablet Take 1 tablet (20 mg total) by mouth daily with supper. 30 tablet 0  . Umeclidinium-Vilanterol (ANORO ELLIPTA) 62.5-25 MCG/INH AEPB Inhale 1 puff into the lungs daily. 3 each 0  . verapamil (VERELAN PM) 240 MG 24 hr capsule Take 240 mg by mouth daily.     No current facility-administered medications on file prior to visit.    Past Surgical History  Procedure Laterality Date  . Foot surgery    . Cholecystectomy    . Tonsillectomy    . Tubal ligation      Allergies  Allergen Reactions  . Tramadol Shortness Of Breath  . Amlodipine     Other reaction(s): SWELLING  . Aspirin     Other reaction(s): PALPITATIONS Other reaction(s): DIFFICULTY BREATHING Heart flutter  . Codeine Other (See Comments)    hallucinations   . Doxycycline     Other reaction(s): NAUSEA,VOMITING  . Hydrocodone     hallucinations hallucinations  . Losartan   . Propoxyphene     Other reaction(s): NAUSEA  . Tiotropium     History   Social History  . Marital Status: Divorced  Spouse Name: N/A    Number of Children: 4  . Years of Education: college   Occupational History  . Retired    Social History Main Topics  . Smoking status: Former Smoker -- 1.50 packs/day for 56 years    Types: Cigarettes    Quit date: 04/29/2012  . Smokeless tobacco: Never Used  . Alcohol Use: No     Comment: quit drinking beer 2005  . Drug Use: No  . Sexual Activity: Not on file   Other Topics Concern  . Not on file   Social History Narrative   Patient lives at home alone- divorced, no pets.     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 Lupus Sister   . COPD Father     smoker deceased.   [name] Heart attack Son     BP 154/80 mmHg  Pulse 86  Ht 5\' 5"  (1.651 m)  Wt 111 lb (50.349 kg)  BMI 18.47 kg/m2  Review of Systems: See HPI above.    Objective:  Physical Exam:  Gen: NAD  Right elbow: No gross deformity, swelling, bruising compared to left elbow. Minimal tenderness over medial epicondyle.  No other right upper arm tenderness. Some pain with resisted wrist flexion.  No pain other wrist, finger, elbow motions and with 5/5 strength. Collateral ligaments intact. NVI distally. Tinels negative radial and cubital tunnels.    Assessment & Plan:  1. Right elbow pain - patient's history and exam fit with a mild medial epicondylitis.  Start home exercises daily.  Tylenol or nsaids as needed.  Heat for spasms.  Start physical therapy as well.  Elbow sleeve for compression.  Reassess in 6 weeks.

## 2014-11-08 ENCOUNTER — Other Ambulatory Visit: Payer: Self-pay | Admitting: Family

## 2014-11-08 NOTE — Telephone Encounter (Signed)
I would not refill.  Please contact pt and confirm that she is taking this and ask who has been prescribing? (I have never given her this med) If she has been taking and tolerating we can refill, but otherwise no.

## 2014-11-08 NOTE — Telephone Encounter (Signed)
Spoke with pt. She was getting from her previous PCP (Dr Nancy Fetter). Reports that she has tolerated medication well. Refills sent.

## 2014-11-08 NOTE — Telephone Encounter (Signed)
  Please see warnings below and advise refill of verapamil?   _ Very High   Allergy/Contraindication: verapamil  No reactions specified. No reaction type specified. User documented allergy severity: None specified.  Drug Class Match with AMLODIPINE (Class: CALCIUM CHANNEL BLOCKERS).  "Other reaction(s): SWELLING"  Details  Override Reason.Marland KitchenMarland KitchenBenefit outweighs riskDose AppropriateClinician ReviewedDefer to RPh   verapamil (CALAN-SR) 240 MG CR tablet [Pharmacy Med Name: VERAPAMIL ER 240 MG TABLET]  Prescription. New.     High   Duplicate Medication Orders  Verapamil HCl, PO, Non-PRN Order  Details  Override Reason.Marland KitchenMarland KitchenBenefit outweighs riskDose AppropriateClinician ReviewedDefer to RPh   verapamil (CALAN-SR) 240 MG CR tablet [Pharmacy Med Name: VERAPAMIL ER 240 MG TABLET]  Prescription. New.   Discontinueverapamil (VERELAN PM) 240 MG 24 hr capsule, Daily  Patient reported medication. To be discontinued.     Medium   Age/Sex Warning: verapamil  Extreme caution for Geriatric Patients  Details  Override Reason.Marland KitchenMarland KitchenBenefit outweighs riskDose AppropriateClinician ReviewedDefer to [employer]

## 2014-11-10 ENCOUNTER — Telehealth: Payer: Self-pay | Admitting: Critical Care Medicine

## 2014-11-10 ENCOUNTER — Telehealth: Payer: Self-pay | Admitting: Family

## 2014-11-10 MED ORDER — LEVOFLOXACIN 500 MG PO TABS: 500.0000 mg | ORAL_TABLET | Freq: Every day | ORAL | Status: DC

## 2014-11-10 MED ORDER — PREDNISONE 10 MG PO TABS: ORAL_TABLET | ORAL | Status: DC

## 2014-11-10 NOTE — Telephone Encounter (Signed)
Increased shortness of breath, cough, and chest tightness not relieved by inhalers. Pt was advised to go to ER by [facility], however patient stated she did not have any transportation and hung up.  Called patient to follow up. She stated that she called Dr. Bettina Gavia office.  She is waiting to hear back from them.  Pt was advised to drive self to [facility] ER.  Pt refused.  Debbrah Alar made aware of situation.  Will follow up with patient.

## 2014-11-10 NOTE — Telephone Encounter (Signed)
Call in prednisone 10mg  Take 4 for two days three for two days two for two days one for two days #20 Call in levaquin 500mg  daily x 5 days

## 2014-11-10 NOTE — Telephone Encounter (Signed)
Spoke with pt, c/o increased sob, chest tightness, a little nonprod cough, headaches, sinus congestion X1 week.  Denies fever.  [facility] [address] [facility] [place].  Dr. Joya Gaskins please advise on recs.  Thank you.

## 2014-11-10 NOTE — Telephone Encounter (Signed)
Patient Name: Holly Page DOB: [birth year] Initial Comment Caller states the patient has shortness of breath Nurse Assessment Nurse: Vallery Sa, RN, Cathy Date/Time (Eastern Time): 11/10/2014 8:45:37 AM Confirm and document reason for call. If symptomatic, describe symptoms. ---Caller states she developed shortness of breath yesterday that is worse today. Has the patient traveled out of the country within the last 30 days? ---No Does the patient require triage? ---Yes Related visit to physician within the last 2 weeks? ---No Does the PT have any chronic conditions? (i.e. diabetes, asthma, etc.) ---Yes List chronic conditions. ---High Blood Pressure, Blood Pressure, COPD Guidelines Guideline Title Affirmed Question Affirmed Notes COPD Oxygen Monitoring and Hypoxia [1] MODERATE difficulty breathing (e.g., speaks in phrases, SOB even at rest) AND [2] worse than normal Final Disposition User Go to ED Now (or PCP triage) Vallery Sa, RN, Cathy Comments Caller has COPD and has her oxygen in place. She shares her current SaO2 level is 98. She shares that she doesn't know if she has transportation to get to the ER. Caller hung up before the call could be closed completely. Called the office and Tiffany provided an update to who will notify Dr Inda Castle. Record of call posted in Epic.

## 2014-11-10 NOTE — Telephone Encounter (Signed)
Pt aware of recs.  meds sent in.  Nothing further needed.  

## 2014-11-21 ENCOUNTER — Other Ambulatory Visit: Payer: Self-pay | Admitting: *Deleted

## 2014-11-21 ENCOUNTER — Other Ambulatory Visit (HOSPITAL_BASED_OUTPATIENT_CLINIC_OR_DEPARTMENT_OTHER): Payer: Medicare Other

## 2014-11-21 DIAGNOSIS — I82401 Acute embolism and thrombosis of unspecified deep veins of right lower extremity: Secondary | ICD-10-CM

## 2014-11-22 ENCOUNTER — Other Ambulatory Visit (HOSPITAL_BASED_OUTPATIENT_CLINIC_OR_DEPARTMENT_OTHER): Payer: Medicare Other | Admitting: Lab

## 2014-11-22 ENCOUNTER — Other Ambulatory Visit (HOSPITAL_BASED_OUTPATIENT_CLINIC_OR_DEPARTMENT_OTHER): Payer: Medicare Other

## 2014-11-22 ENCOUNTER — Ambulatory Visit (HOSPITAL_BASED_OUTPATIENT_CLINIC_OR_DEPARTMENT_OTHER): Payer: Medicare Other | Admitting: Hematology & Oncology

## 2014-11-22 ENCOUNTER — Encounter: Payer: Self-pay | Admitting: Hematology & Oncology

## 2014-11-22 VITALS — BP 157/75 | HR 74 | Temp 98.3°F | Resp 18 | Ht 65.0 in | Wt 113.0 lb

## 2014-11-22 DIAGNOSIS — I824Y1 Acute embolism and thrombosis of unspecified deep veins of right proximal lower extremity: Secondary | ICD-10-CM

## 2014-11-22 DIAGNOSIS — I82401 Acute embolism and thrombosis of unspecified deep veins of right lower extremity: Secondary | ICD-10-CM

## 2014-11-22 DIAGNOSIS — Z86718 Personal history of other venous thrombosis and embolism: Secondary | ICD-10-CM

## 2014-11-22 DIAGNOSIS — Z7901 Long term (current) use of anticoagulants: Secondary | ICD-10-CM

## 2014-11-22 LAB — CBC WITH DIFFERENTIAL (CANCER CENTER ONLY)
BASO#: 0 10*3/uL (ref 0.0–0.2)
BASO%: 0.1 % (ref 0.0–2.0)
EOS ABS: 0.3 10*3/uL (ref 0.0–0.5)
EOS%: 4.5 % (ref 0.0–7.0)
HEMATOCRIT: 37.4 % (ref 34.8–46.6)
HEMOGLOBIN: 11.9 g/dL (ref 11.6–15.9)
LYMPH#: 3 10*3/uL (ref 0.9–3.3)
LYMPH%: 39.5 % (ref 14.0–48.0)
MCH: 22.7 pg — AB (ref 26.0–34.0)
MCHC: 31.8 g/dL — ABNORMAL LOW (ref 32.0–36.0)
MCV: 71 fL — AB (ref 81–101)
MONO#: 0.8 10*3/uL (ref 0.1–0.9)
MONO%: 11 % (ref 0.0–13.0)
NEUT#: 3.4 10*3/uL (ref 1.5–6.5)
NEUT%: 44.9 % (ref 39.6–80.0)
Platelets: 324 10*3/uL (ref 145–400)
RBC: 5.25 10*6/uL (ref 3.70–5.32)
RDW: 16.8 % — ABNORMAL HIGH (ref 11.1–15.7)
WBC: 7.5 10*3/uL (ref 3.9–10.0)

## 2014-11-22 LAB — COMPREHENSIVE METABOLIC PANEL
ALT: 26 U/L (ref 0–35)
AST: 47 U/L — ABNORMAL HIGH (ref 0–37)
Albumin: 4.2 g/dL (ref 3.5–5.2)
Alkaline Phosphatase: 71 U/L (ref 39–117)
BUN: 13 mg/dL (ref 6–23)
CO2: 30 mEq/L (ref 19–32)
Calcium: 9.3 mg/dL (ref 8.4–10.5)
Chloride: 103 mEq/L (ref 96–112)
Creatinine, Ser: 0.62 mg/dL (ref 0.50–1.10)
Glucose, Bld: 85 mg/dL (ref 70–99)
Potassium: 3.4 mEq/L — ABNORMAL LOW (ref 3.5–5.3)
Sodium: 143 mEq/L (ref 135–145)
Total Bilirubin: 0.4 mg/dL (ref 0.2–1.2)
Total Protein: 6.7 g/dL (ref 6.0–8.3)

## 2014-11-22 NOTE — Progress Notes (Signed)
Hematology and Oncology Follow Up Visit  Holly Page [id number] [birth year] 79 y.o. 11/22/2014   Principle Diagnosis:   Thromboembolic disease of the right thigh  Current Therapy:    Xarelto 20 mg by mouth daily-to finish 6 months at the end of March 2016     Interim History:  Ms.  Page is back for follow-up. She is doing okay. She has an awful COPD. She is on chronic oxygen.  She's having an trouble with the Xarelto. She probably cannot afford it. She has been getting samples. We're trying to give samples to her.  She's had no bleeding. Patient has occasional pain in the inner aspect of her right thigh.  Her last Doppler which was done in December 2015, did not show any residual thrombus. She had some vein wall thickening which could be post phlebitic type changes.  She's not had any problems with leg swelling.  There's been no cough. She had some chest wall discomfort.  There is no nausea or vomiting.  Medications:  Current outpatient prescriptions:  .  acetaminophen (TYLENOL) 325 MG tablet, Take 2 tablets (650 mg total) by mouth every 6 (six) hours as needed., Disp: 15 tablet, Rfl: 0 .  albuterol (PROVENTIL HFA;VENTOLIN HFA) 108 (90 BASE) MCG/ACT inhaler, Inhale 2 puffs into the lungs every 6 (six) hours as needed for wheezing or shortness of breath., Disp: 1 Inhaler, Rfl: 3 .  albuterol (PROVENTIL) (2.5 MG/3ML) 0.083% nebulizer solution, Take 2.5 mg by nebulization every 6 (six) hours as needed for shortness of breath. And as needed, Disp: , Rfl:  .  budesonide (PULMICORT) 0.25 MG/2ML nebulizer solution, INHALE 1 VIAL VIA NEBULIZER DAILY, Disp: 60 mL, Rfl: 6 .  Cholecalciferol (VITAMIN D-3) 5000 UNITS TABS, Take 1 tablet by mouth daily., Disp: , Rfl:  .  docusate sodium (COLACE) 100 MG capsule, Take 100 mg by mouth 2 (two) times daily as needed., Disp: , Rfl:  .  hydrochlorothiazide (MICROZIDE) 12.5 MG capsule, Take 12.5 mg by mouth. Once daily, Disp: , Rfl:  .   Loperamide HCl ([facility] ANTI-DIARRHEA PO), Take by mouth as needed., Disp: , Rfl:  .  ranitidine (ZANTAC) 300 MG tablet, Take 300 mg by mouth. 1 tablet at bedtime, Disp: , Rfl:  .  rivaroxaban (XARELTO) 20 MG TABS tablet, Take 1 tablet (20 mg total) by mouth daily with supper., Disp: 30 tablet, Rfl: 0 .  Umeclidinium-Vilanterol (ANORO ELLIPTA) 62.5-25 MCG/INH AEPB, Inhale 1 puff into the lungs daily., Disp: 3 each, Rfl: 0 .  verapamil (CALAN-SR) 240 MG CR tablet, TAKE 1 TABLET BY MOUTH EVERY DAY, Disp: 30 tablet, Rfl: 2  Allergies:  Allergies  Allergen Reactions  . Tramadol Shortness Of Breath  . Amlodipine     Other reaction(s): SWELLING  . Aspirin     Other reaction(s): PALPITATIONS Other reaction(s): DIFFICULTY BREATHING Heart flutter  . Codeine Other (See Comments)    hallucinations   . Doxycycline     Other reaction(s): NAUSEA,VOMITING  . Hydrocodone     hallucinations hallucinations  . Losartan   . Propoxyphene     Other reaction(s): NAUSEA  . Tiotropium     Past Medical History, Surgical history, Social history, and Family History were reviewed and updated.  Review of Systems: As above  Physical Exam:  height is 5\' 5"  (1.651 m) and weight is 113 lb (51.256 kg). Her oral temperature is 98.3 F (36.8 C). Her blood pressure is 157/75 and her pulse is 74. Her respiration is 18.  Thin but well-nourished African-American female. Head and neck exam shows no ocular or oral lesions. She has no palpable cervical or supraclavicular lymph nodes. Lungs are with marked decreased breath sounds bilaterally. She has no wheezes. Cardiac exam regular rate and rhythm with no murmurs, rubs or bruits. Abdomen is soft. She has good bowel sounds. There is no fluid wave. There is no palpable liver or spleen tip. Extremities shows no clubbing, cyanosis or edema. She has some slight tenderness on the inner aspect of her right thigh and the lower portion. No obvious venous cord is noted. She has good  range of motion of her joints. Skin exam shows no rashes, ecchymoses or petechia. Neurological exam is nonfocal.  Lab Results  Component Value Date   WBC 7.5 11/22/2014   HGB 11.9 11/22/2014   HCT 37.4 11/22/2014   MCV 71* 11/22/2014   PLT 324 11/22/2014     Chemistry      Component Value Date/Time   NA 143 11/22/2014 1118   K 3.4* 11/22/2014 1118   CL 103 11/22/2014 1118   CO2 30 11/22/2014 1118   BUN 13 11/22/2014 1118   CREATININE 0.62 11/22/2014 1118      Component Value Date/Time   CALCIUM 9.3 11/22/2014 1118   ALKPHOS 71 11/22/2014 1118   AST 47* 11/22/2014 1118   ALT 26 11/22/2014 1118   BILITOT 0.4 11/22/2014 1118         Impression and Plan: Holly Page is 79 year old African-American female. She had a thrombo-embolic event in the right inner inferior thigh. This happened in September 2015. She apparently had a DVT back in the 80s. She thinks she was on blood thinner for one month.  I think that 6 months of anticoagulation would be appropriate for her.  I think that the pain that she is feeling in the right thigh might be post phlebitic thickening of the vein wall and not an actual thrombus.  Of note, she was supposed to have a urological procedure back in January. She did not have this done because of her breathing issues. She has occasional pink tinged urine.  I want to see her back in about 6 weeks. I want to get a Doppler the day that we see her. If the Doppler still negative for inactive or actual thrombus, that he we can get her off the Xarelto.  I spent about 25-30 minutes with her today.Volanda Napoleon, MD 2/24/20165:52 PM

## 2014-11-27 ENCOUNTER — Telehealth: Payer: Self-pay | Admitting: Critical Care Medicine

## 2014-11-27 MED ORDER — PREDNISONE 10 MG PO TABS: ORAL_TABLET | ORAL | Status: DC

## 2014-11-27 NOTE — Telephone Encounter (Signed)
Can send script for prednisone 10 mg pills >> 3 pills daily for 2 days, 2 pills daily for 2 days, 1 pill daily for 2 days. She would need ROV to assess if not better after this.

## 2014-11-27 NOTE — Telephone Encounter (Signed)
Rx has been sent in per VS. Pt is aware. Nothing further was needed. 

## 2014-11-27 NOTE — Telephone Encounter (Signed)
Spoke with pt. Reports wheezing x1 week. SOB, chest tightness and dry cough are also present. Would like prednisone sent in.  VS - please advise. Thanks.

## 2014-11-28 ENCOUNTER — Telehealth: Payer: Self-pay | Admitting: Critical Care Medicine

## 2014-11-28 NOTE — Telephone Encounter (Signed)
She has been scheduled to see MW tomorrow at 1:30pm. Nothing further was needed.

## 2014-11-29 ENCOUNTER — Ambulatory Visit (INDEPENDENT_AMBULATORY_CARE_PROVIDER_SITE_OTHER): Payer: Medicare Other | Admitting: Internal Medicine

## 2014-11-29 ENCOUNTER — Encounter: Payer: Self-pay | Admitting: Internal Medicine

## 2014-11-29 VITALS — BP 130/72 | HR 70 | Ht 65.0 in | Wt 114.0 lb

## 2014-11-29 DIAGNOSIS — J9611 Chronic respiratory failure with hypoxia: Secondary | ICD-10-CM

## 2014-11-29 DIAGNOSIS — J432 Centrilobular emphysema: Secondary | ICD-10-CM

## 2014-11-29 MED ORDER — PREDNISONE 10 MG PO TABS: ORAL_TABLET | ORAL | Status: DC

## 2014-11-29 MED ORDER — FORMOTEROL FUMARATE 20 MCG/2ML IN NEBU: 20.0000 ug | INHALATION_SOLUTION | Freq: Two times a day (BID) | RESPIRATORY_TRACT | Status: DC

## 2014-11-29 MED ORDER — BUDESONIDE 0.25 MG/2ML IN SUSP: RESPIRATORY_TRACT | Status: DC

## 2014-11-29 NOTE — Patient Instructions (Addendum)
Change your nebulizer to where you take budesonide and performist every moring and every evening about 12 hours apart   Prednisone 10 mg take  4 each am x 2 days,   2 each am x 2 days,  1 each am x 2 days and stop  Please schedule a follow up office visit in 2 weeks, sooner if needed to see Tammy to see how you are doing and if we can continue or if have to change your maintenance  Only use your albuterol as a rescue medication to be used if you can't catch your breath by resting or doing a relaxed purse lip breathing pattern.  - The less you use it, the better it will work when you need it. - Ok to use up to 2 puffs  every 4 hours if you must but call for immediate appointment if use goes up over your usual need - Don't leave home without it !!  (think of it like the spare tire for your car)   Only use your nebulizer albuterol if your try the proair first - to use up to every 4 hours if you must but the goal is not to need it at all   Please schedule a follow up office visit in 2 weeks, sooner if needed to see Tammy but bring all your meds/ solutions with you  Late add: intended to get cxr so if not better needs one on return

## 2014-11-29 NOTE — Progress Notes (Signed)
Subjective:    Patient ID: Holly Page, female    DOB: 1936-08-20 .   MRN: [id number]  HPI 79 y.o. F with Gold D Copd primary emphysema   10/12/2014 Chief Complaint  Patient presents with  . Follow-up    Pt states she fells better since last OV, but states she doe snot feel like she is back to baseline. Pt states she has increased SOB, and chest tightness. Pt denies any increase in cough or congestion.   Pt in [place] before christmas.  Now is better but symptoms come and go.  No real mucus. Cough is dry. No chest pain.  Pt notes some tightness. Rx ABX and Pred and now is finished.  Not much edema.  CT angio chest and venous doppler u/s Neg for VTE.   Hx of renal stones and urology does not want to perform any procedures.   Dyspnea now at baseline rec No change rx   11/29/2014 acute  ov/Anusha Claus re: aecopd   Chief Complaint  Patient presents with  . Acute Visit    Pt c/o increased SOB and wheezing for the past wk. She has occ non prod cough. She is using rescue inhaler on average 2 x per day and neb twice per day.   flares w/in a week of coming off prednisone on bud 0.25 mg daily and anoro various times, runs out and does not refill  No obvious day to day or daytime variabilty or assoc excess or purulent sputum or cp or chest tightness, subjective wheeze overt sinus or hb symptoms. No unusual exp hx or h/o childhood pna/ asthma or knowledge of premature birth.  Sleeping ok without nocturnal  or early am exacerbation  of respiratory  c/o's or need for noct saba. Also denies any obvious fluctuation of symptoms with weather or environmental changes or other aggravating or alleviating factors except as outlined above   Current Medications, Allergies, Complete Past Medical History, Past Surgical History, Family History, and Social History were reviewed in [employer] record.  ROS  The following are not active complaints unless bolded sore throat, dysphagia, dental  problems, itching, sneezing,  nasal congestion or excess/ purulent secretions, ear ache,   fever, chills, sweats, unintended wt loss, pleuritic or exertional cp, hemoptysis,  orthopnea pnd or leg swelling, presyncope, palpitations, heartburn, abdominal pain, anorexia, nausea, vomiting, diarrhea  or change in bowel or urinary habits, change in stools or urine, dysuria,hematuria,  rash, arthralgias, visual complaints, headache, numbness weakness or ataxia or problems with walking or coordination,  change in mood/affect or memory.          Objective:   Physical Exam    GEN: A/Ox3; pleasant , NAD, well nourished / easily confused with details of care   Wt Readings from Last 3 Encounters:  11/29/14 114 lb (51.71 kg)  11/22/14 113 lb (51.256 kg)  11/02/14 111 lb (50.349 kg)    Vital signs reviewed   HEENT:  [place]/AT,  EACs-clear, TMs-wnl, NOSE-clear, THROAT-clear, no lesions, nasal inflammation with mild purulence  NECK:  Supple w/ fair ROM; no JVD; normal carotid impulses w/o bruits; no thyromegaly or nodules palpated; no lymphadenopathy.  RESP  Diminished in bases  Trace exp wheeze bilaterally .no accessory muscle use, no dullness to percussion  CARD:  RRR, no m/r/g  , no peripheral edema, pulses intact, no cyanosis or clubbing.  GI:   Soft & nt; nml bowel sounds; no organomegaly or masses detected.  Musco: Warm bil, no  deformities or joint swelling noted.   Neuro: alert, no focal deficits noted.    Skin: Warm, no lesions or rashes         Assessment & Plan:

## 2014-11-30 ENCOUNTER — Encounter: Payer: Self-pay | Admitting: Internal Medicine

## 2014-11-30 NOTE — Assessment & Plan Note (Signed)
Adequate control on present rx, reviewed > no change in rx needed  = 2lpm 24/7  

## 2014-12-04 ENCOUNTER — Telehealth: Payer: Self-pay | Admitting: Internal Medicine

## 2014-12-04 NOTE — Telephone Encounter (Signed)
Called made pt aware. She will try 1/2 vial twice daily. She wants to keep appt already scheduled with tp. Will sign off

## 2014-12-04 NOTE — Telephone Encounter (Signed)
Spoke with pt. She saw MW last week. She reports the perforomist is not agreeing with her. Reports every time she uses it, it causes aching in her chest and feeling very restless.  Requesting recs. Please advise MW thanks

## 2014-12-04 NOTE — Telephone Encounter (Signed)
The goal was to just use the performist and budesonide and not any of the other inhalers or solutions > if that's in fact what she did then just needs to cut the performist dose in half for now now and just take a half a vial twice daily   Need to move up the f/u either way to see Holly Page with all active meds

## 2014-12-07 ENCOUNTER — Telehealth: Payer: Self-pay | Admitting: Critical Care Medicine

## 2014-12-07 DIAGNOSIS — J449 Chronic obstructive pulmonary disease, unspecified: Secondary | ICD-10-CM

## 2014-12-07 NOTE — Telephone Encounter (Signed)
Hard to believe [facility] wont check out the concentrator,  pls call [facility] to do so

## 2014-12-07 NOTE — Telephone Encounter (Signed)
Pt states she doesn't think oxygen concentrator is working properly.  She states she called [facility] because it hasnt been checked in a year but they told her to call her MD.  Pt c/o nose burning, headaches in am, chest tightness, non prod cough.   Pt uses oxygen at night and prn daytime but states she is having to use it more during the day now.   She was changed to Budesonide/ Performist bid at last ov on 11/29/14 and given pred taper (has 2 tabs left).  Has appt to see TP next week.  Please advise.

## 2014-12-07 NOTE — Telephone Encounter (Signed)
Pt is aware that we will place an order to have [place] come out and check her concentrator.

## 2014-12-08 ENCOUNTER — Encounter (HOSPITAL_BASED_OUTPATIENT_CLINIC_OR_DEPARTMENT_OTHER): Payer: Self-pay | Admitting: *Deleted

## 2014-12-08 ENCOUNTER — Emergency Department (HOSPITAL_BASED_OUTPATIENT_CLINIC_OR_DEPARTMENT_OTHER): Payer: Medicare Other

## 2014-12-08 ENCOUNTER — Inpatient Hospital Stay (HOSPITAL_BASED_OUTPATIENT_CLINIC_OR_DEPARTMENT_OTHER)
Admission: EM | Admit: 2014-12-08 | Discharge: 2014-12-11 | DRG: 190 | Disposition: A | Discharge status: Home / Self Care | Payer: Medicare Other | Attending: Internal Medicine | Admitting: Internal Medicine

## 2014-12-08 DIAGNOSIS — J432 Centrilobular emphysema: Secondary | ICD-10-CM

## 2014-12-08 DIAGNOSIS — Z7901 Long term (current) use of anticoagulants: Secondary | ICD-10-CM

## 2014-12-08 DIAGNOSIS — T380X5A Adverse effect of glucocorticoids and synthetic analogues, initial encounter: Secondary | ICD-10-CM | POA: Diagnosis present

## 2014-12-08 DIAGNOSIS — Z87891 Personal history of nicotine dependence: Secondary | ICD-10-CM

## 2014-12-08 DIAGNOSIS — D72829 Elevated white blood cell count, unspecified: Secondary | ICD-10-CM

## 2014-12-08 DIAGNOSIS — R0602 Shortness of breath: Secondary | ICD-10-CM | POA: Diagnosis not present

## 2014-12-08 DIAGNOSIS — J9621 Acute and chronic respiratory failure with hypoxia: Secondary | ICD-10-CM | POA: Diagnosis present

## 2014-12-08 DIAGNOSIS — E876 Hypokalemia: Secondary | ICD-10-CM | POA: Diagnosis present

## 2014-12-08 DIAGNOSIS — Z9981 Dependence on supplemental oxygen: Secondary | ICD-10-CM | POA: Diagnosis not present

## 2014-12-08 DIAGNOSIS — J439 Emphysema, unspecified: Secondary | ICD-10-CM | POA: Diagnosis present

## 2014-12-08 DIAGNOSIS — I10 Essential (primary) hypertension: Secondary | ICD-10-CM | POA: Diagnosis present

## 2014-12-08 DIAGNOSIS — J441 Chronic obstructive pulmonary disease with (acute) exacerbation: Secondary | ICD-10-CM

## 2014-12-08 DIAGNOSIS — J189 Pneumonia, unspecified organism: Secondary | ICD-10-CM | POA: Diagnosis present

## 2014-12-08 DIAGNOSIS — Z86718 Personal history of other venous thrombosis and embolism: Secondary | ICD-10-CM

## 2014-12-08 DIAGNOSIS — K219 Gastro-esophageal reflux disease without esophagitis: Secondary | ICD-10-CM | POA: Diagnosis present

## 2014-12-08 LAB — CBC
HEMATOCRIT: 38.9 % (ref 36.0–46.0)
Hemoglobin: 12.4 g/dL (ref 12.0–15.0)
MCH: 22.6 pg — ABNORMAL LOW (ref 26.0–34.0)
MCHC: 31.9 g/dL (ref 30.0–36.0)
MCV: 71 fL — AB (ref 78.0–100.0)
PLATELETS: 301 10*3/uL (ref 150–400)
RBC: 5.48 MIL/uL — ABNORMAL HIGH (ref 3.87–5.11)
RDW: 17.3 % — ABNORMAL HIGH (ref 11.5–15.5)
WBC: 15.9 10*3/uL — ABNORMAL HIGH (ref 4.0–10.5)

## 2014-12-08 LAB — BASIC METABOLIC PANEL
Anion gap: 13 (ref 5–15)
BUN: 10 mg/dL (ref 6–23)
CALCIUM: 9.8 mg/dL (ref 8.4–10.5)
CO2: 34 mmol/L — AB (ref 19–32)
CREATININE: 0.49 mg/dL — AB (ref 0.50–1.10)
Chloride: 93 mmol/L — ABNORMAL LOW (ref 96–112)
GFR calc non Af Amer: >90 mL/min (ref >90)
Glucose, Bld: 149 mg/dL — ABNORMAL HIGH (ref 70–99)
Potassium: 2.9 mmol/L — ABNORMAL LOW (ref 3.5–5.1)
Sodium: 140 mmol/L (ref 135–145)

## 2014-12-08 LAB — STREP PNEUMONIAE URINARY ANTIGEN: Strep Pneumo Urinary Antigen: NEGATIVE

## 2014-12-08 LAB — TROPONIN I: Troponin I: <0.03 ng/mL (ref <0.031)

## 2014-12-08 MED ORDER — ALBUTEROL SULFATE (2.5 MG/3ML) 0.083% IN NEBU
2.5000 mg | INHALATION_SOLUTION | Freq: Four times a day (QID) | RESPIRATORY_TRACT | Status: DC | PRN
  Administered 2014-12-09 – 2014-12-10 (×3): 2.5 mg via RESPIRATORY_TRACT
  Filled 2014-12-08 (×3): qty 3

## 2014-12-08 MED ORDER — CEFTRIAXONE SODIUM 1 G IJ SOLR
INTRAMUSCULAR | Status: AC
  Administered 2014-12-08: 14:00:00
  Filled 2014-12-08: qty 10

## 2014-12-08 MED ORDER — BUDESONIDE 0.25 MG/2ML IN SUSP
0.2500 mg | Freq: Two times a day (BID) | RESPIRATORY_TRACT | Status: DC
  Administered 2014-12-08 – 2014-12-11 (×6): 0.25 mg via RESPIRATORY_TRACT
  Filled 2014-12-08 (×8): qty 2

## 2014-12-08 MED ORDER — FAMOTIDINE 20 MG PO TABS
40.0000 mg | ORAL_TABLET | Freq: Every day | ORAL | Status: DC
  Administered 2014-12-08 – 2014-12-10 (×3): 40 mg via ORAL
  Filled 2014-12-08 (×3): qty 2

## 2014-12-08 MED ORDER — AZITHROMYCIN 250 MG PO TABS
500.0000 mg | ORAL_TABLET | ORAL | Status: DC
Start: 2014-12-09 — End: 2014-12-11
  Administered 2014-12-09 – 2014-12-10 (×2): 500 mg via ORAL
  Filled 2014-12-08 (×2): qty 2

## 2014-12-08 MED ORDER — ALBUTEROL SULFATE HFA 108 (90 BASE) MCG/ACT IN AERS: 2.0000 | INHALATION_SPRAY | Freq: Four times a day (QID) | RESPIRATORY_TRACT | Status: DC | PRN

## 2014-12-08 MED ORDER — DEXTROSE 5 % IV SOLN
1.0000 g | Freq: Once | INTRAVENOUS | Status: AC
  Administered 2014-12-08: 1 g via INTRAVENOUS

## 2014-12-08 MED ORDER — AZITHROMYCIN 250 MG PO TABS
500.0000 mg | ORAL_TABLET | Freq: Once | ORAL | Status: AC
  Administered 2014-12-08: 500 mg via ORAL
  Filled 2014-12-08: qty 2

## 2014-12-08 MED ORDER — HYDROCHLOROTHIAZIDE 12.5 MG PO CAPS
12.5000 mg | ORAL_CAPSULE | Freq: Every day | ORAL | Status: DC
  Administered 2014-12-09 – 2014-12-11 (×4): 12.5 mg via ORAL
  Filled 2014-12-08 (×4): qty 1

## 2014-12-08 MED ORDER — POTASSIUM CHLORIDE CRYS ER 20 MEQ PO TBCR
40.0000 meq | EXTENDED_RELEASE_TABLET | Freq: Once | ORAL | Status: AC
Start: 2014-12-08 — End: 2014-12-08
  Administered 2014-12-08: 40 meq via ORAL
  Filled 2014-12-08: qty 2

## 2014-12-08 MED ORDER — CEFTRIAXONE SODIUM IN DEXTROSE 20 MG/ML IV SOLN
1.0000 g | INTRAVENOUS | Status: DC
Start: 2014-12-09 — End: 2014-12-11
  Administered 2014-12-09 – 2014-12-10 (×2): 1 g via INTRAVENOUS
  Filled 2014-12-08 (×3): qty 50

## 2014-12-08 MED ORDER — ALBUTEROL SULFATE (2.5 MG/3ML) 0.083% IN NEBU
5.0000 mg | INHALATION_SOLUTION | Freq: Once | RESPIRATORY_TRACT | Status: AC
  Administered 2014-12-08: 5 mg via RESPIRATORY_TRACT
  Filled 2014-12-08: qty 6

## 2014-12-08 MED ORDER — PREDNISONE 50 MG PO TABS
50.0000 mg | ORAL_TABLET | Freq: Every day | ORAL | Status: DC
  Administered 2014-12-09 – 2014-12-11 (×3): 50 mg via ORAL
  Filled 2014-12-08 (×3): qty 1

## 2014-12-08 MED ORDER — VITAMIN D-3 125 MCG (5000 UT) PO TABS: 1.0000 | ORAL_TABLET | Freq: Every day | ORAL | Status: DC

## 2014-12-08 MED ORDER — FORMOTEROL FUMARATE 12 MCG IN CAPS
12.0000 ug | ORAL_CAPSULE | Freq: Two times a day (BID) | RESPIRATORY_TRACT | Status: DC
  Administered 2014-12-09 – 2014-12-11 (×5): 12 ug via RESPIRATORY_TRACT
  Filled 2014-12-08 (×17): qty 1

## 2014-12-08 MED ORDER — VITAMIN D 1000 UNITS PO TABS
1000.0000 [IU] | ORAL_TABLET | Freq: Every day | ORAL | Status: DC
  Administered 2014-12-09: 1000 [IU] via ORAL
  Filled 2014-12-08 (×2): qty 1

## 2014-12-08 MED ORDER — ACETAMINOPHEN 325 MG PO TABS: 650.0000 mg | ORAL_TABLET | Freq: Four times a day (QID) | ORAL | Status: DC | PRN

## 2014-12-08 MED ORDER — VERAPAMIL HCL ER 240 MG PO TBCR
240.0000 mg | EXTENDED_RELEASE_TABLET | Freq: Every day | ORAL | Status: DC
  Administered 2014-12-09 – 2014-12-11 (×4): 240 mg via ORAL
  Filled 2014-12-08 (×4): qty 1

## 2014-12-08 MED ORDER — PREDNISONE 50 MG PO TABS
60.0000 mg | ORAL_TABLET | Freq: Once | ORAL | Status: AC
  Administered 2014-12-08: 60 mg via ORAL
  Filled 2014-12-08 (×2): qty 1

## 2014-12-08 MED ORDER — RIVAROXABAN 20 MG PO TABS: 20.0000 mg | ORAL_TABLET | Freq: Every day | ORAL | Status: DC

## 2014-12-08 NOTE — Progress Notes (Signed)
known history of COPD, followed by Dr. Joya Gaskins, on 2 L nasal cannula at baseline, who presents with worsening shortness of breath, cough, productive sputum, recently started on oral prednisone by her PCP, presents with shortness of breath, chest x-ray showing pneumonia, started on Rocephin and azithromycin for community-acquired pneumonia, accepted to medical/surgical floor, given oral prednisone in ED. Holly Climes MD

## 2014-12-08 NOTE — ED Notes (Signed)
Sob since Tuesday. She was started on Prednisone on Tuesday. Home oxygen.

## 2014-12-08 NOTE — H&P (Signed)
[facility] History and Physical  Holly Page HKV:[id number] DOB: 03/08/1936 DOA: 12/08/2014  Referring physician: EDP PCP: Nance Pear., NP   Chief Complaint: SOB   HPI: Holly Page is a 79 y.o. female h/o COPD, on 2L home O2 at baseline.  She presents to the ED at [facility] with cough, now productive of purulent sputum.  This has been worsening especially over the last 2 days.  Has had subjective fever at home, decreased appetite, one episode of vomiting yesterday.  PCP has had her on steroid taper which hasnt been helping as much as she would like.  Review of Systems: Systems reviewed.  As above, otherwise negative  Past Medical History  Diagnosis Date  . Pneumonia   . COPD (chronic obstructive pulmonary disease)   . Hypertension   . DVT (deep venous thrombosis) 1980s, recurrent 2015    Right DVT 2015  on xarelto  . GERD (gastroesophageal reflux disease)   . Lactose intolerance   . Spinal stenosis   . Meningioma     followed by Dr Christella Noa   Past Surgical History  Procedure Laterality Date  . Foot surgery    . Cholecystectomy    . Tonsillectomy    . Tubal ligation     Social History:  reports that she quit smoking about 2 years ago. Her smoking use included Cigarettes. She has a 84 pack-year smoking history. She has never used smokeless tobacco. She reports that she does not drink alcohol or use illicit drugs.  Allergies  Allergen Reactions  . Tramadol Shortness Of Breath  . Amlodipine     Other reaction(s): SWELLING  . Aspirin     Other reaction(s): PALPITATIONS Other reaction(s): DIFFICULTY BREATHING Heart flutter  . Codeine Other (See Comments)    hallucinations   . Doxycycline     Other reaction(s): NAUSEA,VOMITING  . Hydrocodone     hallucinations hallucinations  . Losartan   . Propoxyphene     Other reaction(s): NAUSEA  . Tiotropium     Family History  Problem Relation Age of Onset  . Lupus Sister   . COPD Father     smoker  deceased.   [name] Heart attack Son      Prior to Admission medications   Medication Sig Start Date End Date Taking? Authorizing Provider  acetaminophen (TYLENOL) 325 MG tablet Take 2 tablets (650 mg total) by mouth every 6 (six) hours as needed. 01/19/14   Carmin Muskrat, MD  albuterol (PROVENTIL HFA;VENTOLIN HFA) 108 (90 BASE) MCG/ACT inhaler Inhale 2 puffs into the lungs every 6 (six) hours as needed for wheezing or shortness of breath. 05/01/14   Elsie Stain, MD  albuterol (PROVENTIL) (2.5 MG/3ML) 0.083% nebulizer solution Take 2.5 mg by nebulization every 6 (six) hours as needed for shortness of breath. And as needed 11/28/13   Elsie Stain, MD  budesonide (PULMICORT) 0.25 MG/2ML nebulizer solution One twice daily 11/29/14   Tanda Rockers, MD  Cholecalciferol (VITAMIN D-3) 5000 UNITS TABS Take 1 tablet by mouth daily.    Historical Provider, MD  docusate sodium (COLACE) 100 MG capsule Take 100 mg by mouth 2 (two) times daily as needed.    Historical Provider, MD  formoterol (PERFOROMIST) 20 MCG/2ML nebulizer solution Take 2 mLs (20 mcg total) by nebulization 2 (two) times daily. Use in nebulizer twice daily perfectly regularly 11/29/14   Tanda Rockers, MD  hydrochlorothiazide (MICROZIDE) 12.5 MG capsule Take 12.5 mg by mouth. Once daily 03/08/14 03/08/15  Historical Provider, MD  predniSONE (DELTASONE) 10 MG tablet Take  4 each am x 2 days,   2 each am x 2 days,  1 each am x 2 days and stop 11/29/14   Tanda Rockers, MD  ranitidine (ZANTAC) 300 MG tablet Take 300 mg by mouth. 1 tablet at bedtime 03/20/14 03/20/15  Historical Provider, MD  rivaroxaban (XARELTO) 20 MG TABS tablet Take 1 tablet (20 mg total) by mouth daily with supper. 10/09/14   Debbrah Alar, NP  verapamil (CALAN-SR) 240 MG CR tablet TAKE 1 TABLET BY MOUTH EVERY DAY 11/08/14   Debbrah Alar, NP   Physical Exam: Filed Vitals:   12/08/14 1907  BP: 159/88  Pulse: 87  Temp: 97.9 F (36.6 C)  Resp: 20    BP 159/88 mmHg   Pulse 87  Temp(Src) 97.9 F (36.6 C) (Oral)  Resp 20  Ht 5\' 5"  (1.651 m)  Wt 52.164 kg (115 lb)  BMI 19.14 kg/m2  SpO2 97%  General Appearance:    Alert, oriented, no distress, appears stated age  Head:    Normocephalic, atraumatic  Eyes:    PERRL, EOMI, sclera non-icteric        Nose:   Nares without drainage or epistaxis. Mucosa, turbinates normal  Throat:   Moist mucous membranes. Oropharynx without erythema or exudate.  Neck:   Supple. No carotid bruits.  No thyromegaly.  No lymphadenopathy.   Back:     No CVA tenderness, no spinal tenderness  Lungs:     Diffuse wheezes  Chest wall:    No tenderness to palpitation  Heart:    Regular rate and rhythm without murmurs, gallops, rubs  Abdomen:     Soft, non-tender, nondistended, normal bowel sounds, no organomegaly  Genitalia:    deferred  Rectal:    deferred  Extremities:   No clubbing, cyanosis or edema.  Pulses:   2+ and symmetric all extremities  Skin:   Skin color, texture, turgor normal, no rashes or lesions  Lymph nodes:   Cervical, supraclavicular, and axillary nodes normal  Neurologic:   CNII-XII intact. Normal strength, sensation and reflexes      throughout    Labs on Admission:  Basic Metabolic Panel:  Recent Labs Lab 12/08/14 1135  NA 140  K 2.9*  CL 93*  CO2 34*  GLUCOSE 149*  BUN 10  CREATININE 0.49*  CALCIUM 9.8   Liver Function Tests: No results for input(s): AST, ALT, ALKPHOS, BILITOT, PROT, ALBUMIN in the last 168 hours. No results for input(s): LIPASE, AMYLASE in the last 168 hours. No results for input(s): AMMONIA in the last 168 hours. CBC:  Recent Labs Lab 12/08/14 1135  WBC 15.9*  HGB 12.4  HCT 38.9  MCV 71.0*  PLT 301   Cardiac Enzymes:  Recent Labs Lab 12/08/14 1135  TROPONINI <0.03    BNP (last 3 results)  Recent Labs  07/04/14 1230 09/14/14 1246  PROBNP 19.0 49.2   CBG: No results for input(s): GLUCAP in the last 168 hours.  Radiological Exams on Admission: Dg  Chest 2 View (if Patient Has Fever And/or Copd)  12/08/2014   CLINICAL DATA:  Shortness of breath and cough  EXAM: CHEST  2 VIEW  COMPARISON:  07/04/2014  FINDINGS: Heart size is normal. No pleural effusion or edema. The lungs are hyperinflated with coarsened interstitial markings bilaterally compatible with emphysema. Anterior lung opacity identified on the lateral radiograph suspicious for lingular pneumonia.  IMPRESSION: 1. Suspect lingular pneumonia. 2.  Advanced changes of emphysema.   Electronically Signed   By: Kerby Moors PageD.   On: 12/08/2014 12:11    EKG: Independently reviewed.  Assessment/Plan Principal Problem:   Lingular pneumonia Active Problems:   COPD with emphysema Gold D   Acute on chronic respiratory failure with hypoxia   CAP (community acquired pneumonia)   Hypokalemia   Leukocytosis   1. Lingular pneumonia - 1. CAP pathway 2. Rocephin and azithromycin 3. Cultures pending 2. COPD exacerbation - 1. Adult wheeze protocol 2. Prednisone daily 3. Acute on chronic respiratory failure - 1. Continue O2 2. ABx, steroids, neb treatments as above 4. Hypokalemia - replacing potassium PO, recheck BMP in AM 5. Leukocytosis - recheck CBC tomorrow AM, likely secondary to #1 above.    Code Status: Full code  Family Communication: No family in room Disposition Plan: Admit to inpatient   Time spent: 70 min  Holly Newlun M. [facility] Pager [phone]  If 7AM-7PM, please contact the day team taking care of the patient [email or web address] Password [facility] 12/08/2014, 8:19 PM

## 2014-12-08 NOTE — ED Notes (Signed)
Pt. Resp. Rate increases with any exertion and movement.

## 2014-12-08 NOTE — ED Notes (Signed)
Dr Maryan Rued gave permission for patient to have ice water - pt tolerated well.

## 2014-12-08 NOTE — ED Notes (Signed)
Pt. Placed in geri chair and call bell on arm of chair.    Pt. In no distress at this time.

## 2014-12-08 NOTE — ED Provider Notes (Signed)
CSN: [id number]     Arrival date & time 12/08/14  1059 History   First MD Initiated Contact with Patient 12/08/14 1114     Chief Complaint  Patient presents with  . Shortness of Breath     (Consider location/radiation/quality/duration/timing/severity/associated sxs/prior Treatment) HPI 79 year old female with COPD presents today with worsening dyspnea, cough with change in sputum which has been worsening over several months but is now worse for 2 days. She is on 2 L of oxygen at home. She denies chest pain or objective fever or chills. She has had some subjective fever. She has had some decreased appetite. Past Medical History  Diagnosis Date  . Pneumonia   . COPD (chronic obstructive pulmonary disease)   . Hypertension   . DVT (deep venous thrombosis) 1980s, recurrent 2015    Right DVT 2015  on xarelto  . GERD (gastroesophageal reflux disease)   . Lactose intolerance   . Spinal stenosis   . Meningioma     followed by Dr Christella Noa   Past Surgical History  Procedure Laterality Date  . Foot surgery    . Cholecystectomy    . Tonsillectomy    . Tubal ligation     Family History  Problem Relation Age of Onset  . Lupus Sister   . COPD Father     smoker deceased.   [name] Heart attack Son    History  Substance Use Topics  . Smoking status: Former Smoker -- 1.50 packs/day for 56 years    Types: Cigarettes    Quit date: 04/29/2012  . Smokeless tobacco: Never Used     Comment: quit smoking 3 years ago  . Alcohol Use: No     Comment: quit drinking beer 2005   OB History    No data available     Review of Systems  All other systems reviewed and are negative.     Allergies  Tramadol; Amlodipine; Aspirin; Codeine; Doxycycline; Hydrocodone; Losartan; Propoxyphene; and Tiotropium  Home Medications   Prior to Admission medications   Medication Sig Start Date End Date Taking? Authorizing Provider  acetaminophen (TYLENOL) 325 MG tablet Take 2 tablets (650 mg total) by mouth  every 6 (six) hours as needed. 01/19/14   Carmin Muskrat, MD  albuterol (PROVENTIL HFA;VENTOLIN HFA) 108 (90 BASE) MCG/ACT inhaler Inhale 2 puffs into the lungs every 6 (six) hours as needed for wheezing or shortness of breath. 05/01/14   Elsie Stain, MD  albuterol (PROVENTIL) (2.5 MG/3ML) 0.083% nebulizer solution Take 2.5 mg by nebulization every 6 (six) hours as needed for shortness of breath. And as needed 11/28/13   Elsie Stain, MD  budesonide (PULMICORT) 0.25 MG/2ML nebulizer solution One twice daily 11/29/14   Tanda Rockers, MD  Cholecalciferol (VITAMIN D-3) 5000 UNITS TABS Take 1 tablet by mouth daily.    Historical Provider, MD  docusate sodium (COLACE) 100 MG capsule Take 100 mg by mouth 2 (two) times daily as needed.    Historical Provider, MD  formoterol (PERFOROMIST) 20 MCG/2ML nebulizer solution Take 2 mLs (20 mcg total) by nebulization 2 (two) times daily. Use in nebulizer twice daily perfectly regularly 11/29/14   Tanda Rockers, MD  hydrochlorothiazide (MICROZIDE) 12.5 MG capsule Take 12.5 mg by mouth. Once daily 03/08/14 03/08/15  Historical Provider, MD  Loperamide HCl ([employer] ANTI-DIARRHEA PO) Take by mouth as needed.    Historical Provider, MD  predniSONE (DELTASONE) 10 MG tablet Take  4 each am x 2 days,   2 each  am x 2 days,  1 each am x 2 days and stop 11/29/14   Tanda Rockers, MD  ranitidine (ZANTAC) 300 MG tablet Take 300 mg by mouth. 1 tablet at bedtime 03/20/14 03/20/15  Historical Provider, MD  rivaroxaban (XARELTO) 20 MG TABS tablet Take 1 tablet (20 mg total) by mouth daily with supper. 10/09/14   Debbrah Alar, NP  verapamil (CALAN-SR) 240 MG CR tablet TAKE 1 TABLET BY MOUTH EVERY DAY 11/08/14   Debbrah Alar, NP   BP 154/68 mmHg  Pulse 78  Temp(Src) 98.8 F (37.1 C) (Oral)  Resp 21  Ht 5\' 5"  (1.651 m)  Wt 114 lb (51.71 kg)  BMI 18.97 kg/m2  SpO2 100% Physical Exam  Constitutional: She is oriented to person, place, and time.  Thin female who appears to be in  moderate respiratory distress  HENT:  Head: Normocephalic and atraumatic.  Right Ear: External ear normal.  Left Ear: External ear normal.  Eyes: Conjunctivae and EOM are normal. Pupils are equal, round, and reactive to light.  Neck: Normal range of motion.  Cardiovascular: Normal rate and regular rhythm.   Pulmonary/Chest:  Increased respiratory rate with expiratory wheezes and decreased breath sounds throughout  Abdominal: Soft. Bowel sounds are normal.  Musculoskeletal: Normal range of motion.  Neurological: She is alert and oriented to person, place, and time. She has normal reflexes.  Skin: Skin is warm and dry.  Psychiatric: She has a normal mood and affect. Her behavior is normal. Judgment and thought content normal.  Nursing note and vitals reviewed.   ED Course  Procedures (including critical care time) Labs Review Labs Reviewed  CBC - Abnormal; Notable for the following:    WBC 15.9 (*)    RBC 5.48 (*)    MCV 71.0 (*)    MCH 22.6 (*)    RDW 17.3 (*)    All other components within normal limits  BASIC METABOLIC PANEL - Abnormal; Notable for the following:    Potassium 2.9 (*)    Chloride 93 (*)    CO2 34 (*)    Glucose, Bld 149 (*)    Creatinine, Ser 0.49 (*)    All other components within normal limits  TROPONIN I    Imaging Review Dg Chest 2 View (if Patient Has Fever And/or Copd)  12/08/2014   CLINICAL DATA:  Shortness of breath and cough  EXAM: CHEST  2 VIEW  COMPARISON:  07/04/2014  FINDINGS: Heart size is normal. No pleural effusion or edema. The lungs are hyperinflated with coarsened interstitial markings bilaterally compatible with emphysema. Anterior lung opacity identified on the lateral radiograph suspicious for lingular pneumonia.  IMPRESSION: 1. Suspect lingular pneumonia. 2. Advanced changes of emphysema.   Electronically Signed   By: Kerby Moors M.D.   On: 12/08/2014 12:11     EKG Interpretation None      MDM   Final diagnoses:  CAP  (community acquired pneumonia)  Chronic obstructive pulmonary disease with acute exacerbation    79 year old female with COPD who now presents with a lingular pneumonia and worsening COPD. She is receiving IV Rocephin and Zithromax. Plan is admission to hospital for further treatment.  Discussed with Dr. Waldron Labs and plan admission to med surg bed.   Pattricia Boss, MD 12/08/14 [phone]

## 2014-12-09 DIAGNOSIS — J439 Emphysema, unspecified: Secondary | ICD-10-CM

## 2014-12-09 LAB — CBC
HCT: 35.1 % — ABNORMAL LOW (ref 36.0–46.0)
Hemoglobin: 10.9 g/dL — ABNORMAL LOW (ref 12.0–15.0)
MCH: 22.6 pg — AB (ref 26.0–34.0)
MCHC: 31.1 g/dL (ref 30.0–36.0)
MCV: 72.8 fL — ABNORMAL LOW (ref 78.0–100.0)
PLATELETS: 344 10*3/uL (ref 150–400)
RBC: 4.82 MIL/uL (ref 3.87–5.11)
RDW: 16 % — ABNORMAL HIGH (ref 11.5–15.5)
WBC: 14.7 10*3/uL — AB (ref 4.0–10.5)

## 2014-12-09 LAB — EXPECTORATED SPUTUM ASSESSMENT W REFEX TO RESP CULTURE

## 2014-12-09 LAB — BASIC METABOLIC PANEL
Anion gap: 11 (ref 5–15)
BUN: 9 mg/dL (ref 6–23)
CO2: 35 mmol/L — AB (ref 19–32)
Calcium: 9.7 mg/dL (ref 8.4–10.5)
Chloride: 96 mmol/L (ref 96–112)
Creatinine, Ser: 0.51 mg/dL (ref 0.50–1.10)
GFR calc Af Amer: >90 mL/min (ref >90)
GLUCOSE: 108 mg/dL — AB (ref 70–99)
POTASSIUM: 3.5 mmol/L (ref 3.5–5.1)
SODIUM: 142 mmol/L (ref 135–145)

## 2014-12-09 LAB — EXPECTORATED SPUTUM ASSESSMENT W GRAM STAIN, RFLX TO RESP C

## 2014-12-09 MED ORDER — RIVAROXABAN 20 MG PO TABS
20.0000 mg | ORAL_TABLET | Freq: Every day | ORAL | Status: DC
  Administered 2014-12-09 – 2014-12-11 (×3): 20 mg via ORAL
  Filled 2014-12-09 (×3): qty 1

## 2014-12-09 NOTE — Progress Notes (Signed)
Pt send the sputum c/s and gram stain but the lab said need to send a new specimen.

## 2014-12-09 NOTE — Progress Notes (Signed)
PROGRESS NOTE  Holly Page VCB:[id number] DOB: 1936-03-25 DOA: 12/08/2014 PCP: Nance Pear., NP  HPI: Holly Page is a 79 y.o. female h/o COPD, on 2L home O2 at baseline. She presents to the ED at [facility] with cough, now productive of purulent sputum. This has been worsening especially over the last 2 days. Has had subjective fever at home, decreased appetite, one episode of vomiting yesterday. PCP has had her on steroid taper which hasnt been helping as much as she would like.  Subjective / 24 H Interval events - ongoing dyspnea, appreciates some improvement today   Assessment/Plan: Principal Problem:   Lingular pneumonia Active Problems:   COPD with emphysema Gold D   Acute on chronic respiratory failure with hypoxia   CAP (community acquired pneumonia)   Hypokalemia   Leukocytosis    CAP  - continue Ceftriaxone and Azithromycin  - cultures pending  COPD exacerbation  - continue nebs, antibiotics and steroids - followed by Pulmonology as an outpatient   Acute on chronic respiratory failure  - due to #1 and #2  Hypokalemia  - improved with repletion, monitor  Leukocytosis  - due to steroids, #1  RLE DVT - continue Xarelto, supposed to have a repeat DVT US in April     Diet: Diet regular Fluids: none DVT Prophylaxis: Xarelto   Code Status: Full Code Family Communication: none bedside  Disposition Plan: home when ready   Consultants:  None   Procedures:  None    Antibiotics Ceftriaxone 3/11 >> Azithromycin 3/11 >>   Studies  Dg Chest 2 View (if Patient Has Fever And/or Copd)  12/08/2014   CLINICAL DATA:  Shortness of breath and cough  EXAM: CHEST  2 VIEW  COMPARISON:  07/04/2014  FINDINGS: Heart size is normal. No pleural effusion or edema. The lungs are hyperinflated with coarsened interstitial markings bilaterally compatible with emphysema. Anterior lung opacity identified on the lateral radiograph suspicious for lingular  pneumonia.  IMPRESSION: 1. Suspect lingular pneumonia. 2. Advanced changes of emphysema.   Electronically Signed   By: Kerby Moors M.D.   On: 12/08/2014 12:11    Objective  Filed Vitals:   12/09/14 0048 12/09/14 0456 12/09/14 0959 12/09/14 1009  BP: 144/74 123/67    Pulse:  75    Temp:  98.5 F (36.9 C)    TempSrc:  Oral    Resp:  20    Height:      Weight:      SpO2:  100% 98% 98%    Intake/Output Summary (Last 24 hours) at 12/09/14 1259 Last data filed at 12/09/14 0500  Gross per 24 hour  Intake    320 ml  Output    300 ml  Net     20 ml   Filed Weights   12/08/14 1104 12/08/14 1907  Weight: 51.71 kg (114 lb) 52.164 kg (115 lb)    Exam:  General:  NAD  HEENT: no scleral icterus  Cardiovascular: RRR without murmurs, good pulses, no edems  Respiratory: no wheezing, significantly decreased breath sounds throughout  Abdomen: soft, non tender  MSK/Extremities: no clubbing/cyanosis   Skin: no rashes  Neuro: non focal   Data Reviewed: Basic Metabolic Panel:  Recent Labs Lab 12/08/14 1135 12/09/14 0540  NA 140 142  K 2.9* 3.5  CL 93* 96  CO2 34* 35*  GLUCOSE 149* 108*  BUN 10 9  CREATININE 0.49* 0.51  CALCIUM 9.8 9.7   CBC:  Recent Labs Lab 12/08/14  1135 12/09/14 0540  WBC 15.9* 14.7*  HGB 12.4 10.9*  HCT 38.9 35.1*  MCV 71.0* 72.8*  PLT 301 344   Cardiac Enzymes:  Recent Labs Lab 12/08/14 1135  TROPONINI <0.03   ProBNP (last 3 results)  Recent Labs  07/04/14 1230 09/14/14 1246  PROBNP 19.0 49.2   Recent Results (from the past 240 hour(s))  Culture, sputum-assessment     Status: None   Collection Time: 12/09/14  3:50 AM  Result Value Ref Range Status   Specimen Description SPUTUM  Final   Special Requests NONE  Final   Sputum evaluation   Final    MICROSCOPIC FINDINGS SUGGEST THAT THIS SPECIMEN IS NOT REPRESENTATIVE OF LOWER RESPIRATORY SECRETIONS. PLEASE RECOLLECT. CALLED A.ESPRERA RN [phone] 12/09/14 E.GADDY    Report  Status 12/09/2014 FINAL  Final     Scheduled Meds: . azithromycin  500 mg Oral Q24H  . budesonide  0.25 mg Nebulization BID  . cefTRIAXone (ROCEPHIN)  IV  1 g Intravenous Q24H  . cholecalciferol  1,000 Units Oral Daily  . famotidine  40 mg Oral QHS  . formoterol  12 mcg Inhalation BID  . hydrochlorothiazide  12.5 mg Oral Daily  . predniSONE  50 mg Oral Q breakfast  . rivaroxaban  20 mg Oral Q breakfast  . verapamil  240 mg Oral Daily   Continuous Infusions:   Marzetta Board, MD [facility] Pager [phone]. If 7 PM - 7 AM, please contact night-coverage at [email or web address], password [facility] 12/09/2014, 12:59 PM  LOS: 1 day

## 2014-12-10 LAB — CBC
HCT: 35 % — ABNORMAL LOW (ref 36.0–46.0)
Hemoglobin: 10.7 g/dL — ABNORMAL LOW (ref 12.0–15.0)
MCH: 22.1 pg — AB (ref 26.0–34.0)
MCHC: 30.6 g/dL (ref 30.0–36.0)
MCV: 72.2 fL — ABNORMAL LOW (ref 78.0–100.0)
Platelets: 352 10*3/uL (ref 150–400)
RBC: 4.85 MIL/uL (ref 3.87–5.11)
RDW: 16.1 % — ABNORMAL HIGH (ref 11.5–15.5)
WBC: 11.2 10*3/uL — ABNORMAL HIGH (ref 4.0–10.5)

## 2014-12-10 LAB — BASIC METABOLIC PANEL
Anion gap: 10 (ref 5–15)
BUN: 13 mg/dL (ref 6–23)
CALCIUM: 9.6 mg/dL (ref 8.4–10.5)
CHLORIDE: 97 mmol/L (ref 96–112)
CO2: 36 mmol/L — ABNORMAL HIGH (ref 19–32)
Creatinine, Ser: 0.55 mg/dL (ref 0.50–1.10)
GFR calc Af Amer: >90 mL/min (ref >90)
GFR, EST NON AFRICAN AMERICAN: 89 mL/min — AB (ref >90)
Glucose, Bld: 92 mg/dL (ref 70–99)
POTASSIUM: 3.1 mmol/L — AB (ref 3.5–5.1)
Sodium: 143 mmol/L (ref 135–145)

## 2014-12-10 LAB — EXPECTORATED SPUTUM ASSESSMENT W REFEX TO RESP CULTURE

## 2014-12-10 LAB — EXPECTORATED SPUTUM ASSESSMENT W GRAM STAIN, RFLX TO RESP C

## 2014-12-10 LAB — HIV ANTIBODY (ROUTINE TESTING W REFLEX): HIV Screen 4th Generation wRfx: NONREACTIVE

## 2014-12-10 MED ORDER — GUAIFENESIN 100 MG/5ML PO SYRP
200.0000 mg | ORAL_SOLUTION | ORAL | Status: DC | PRN
  Filled 2014-12-10: qty 10

## 2014-12-10 MED ORDER — VITAMIN D 1000 UNITS PO TABS
1000.0000 [IU] | ORAL_TABLET | Freq: Every day | ORAL | Status: DC
  Administered 2014-12-11: 1000 [IU] via ORAL
  Filled 2014-12-10 (×2): qty 1

## 2014-12-10 MED ORDER — POTASSIUM CHLORIDE CRYS ER 20 MEQ PO TBCR
40.0000 meq | EXTENDED_RELEASE_TABLET | ORAL | Status: AC
  Administered 2014-12-10: 40 meq via ORAL
  Filled 2014-12-10: qty 2

## 2014-12-10 MED ORDER — POTASSIUM CHLORIDE CRYS ER 20 MEQ PO TBCR
40.0000 meq | EXTENDED_RELEASE_TABLET | Freq: Once | ORAL | Status: DC
Start: 2014-12-10 — End: 2014-12-11

## 2014-12-10 NOTE — Progress Notes (Signed)
PROGRESS NOTE   ANYE BROSE OZD:[id number] DOB: 1936-05-16 DOA: 12/08/2014 PCP: Nance Pear., NP  HPI: SHAVON ASHMORE is a 79 y.o. female h/o COPD, on 2L home O2 at baseline. She presents to the ED at [facility] with cough, now productive of purulent sputum. This has been worsening especially over the last 2 days. Has had subjective fever at home, decreased appetite, one episode of vomiting yesterday. PCP has had her on steroid taper which hasnt been helping as much as she would like.  Subjective / 24 H Interval events - improving, wants to walk more  Assessment/Plan: Principal Problem:   Lingular pneumonia Active Problems:   COPD with emphysema Gold D   Acute on chronic respiratory failure with hypoxia   CAP (community acquired pneumonia)   Hypokalemia   Leukocytosis   CAP  - continue Ceftriaxone and Azithromycin  - cultures pending  COPD exacerbation  - continue nebs, antibiotics and steroids - followed by Pulmonology as an outpatient  - slowly improving, able to ambulate in the hallway  Acute on chronic respiratory failure  - due to #1 and #2  Hypokalemia  - replete, monitor  Leukocytosis  - due to steroids, #1  RLE DVT - continue Xarelto, supposed to have a repeat DVT US in April     Diet: Diet regular Fluids: none DVT Prophylaxis: Xarelto   Code Status: Full Code Family Communication: none bedside  Disposition Plan: home when ready, anticipate d/c 24-48h  Consultants:  None   Procedures:  None    Antibiotics Ceftriaxone 3/11 >> Azithromycin 3/11 >>   Studies  No results found.  Objective  Filed Vitals:   12/09/14 2054 12/09/14 2218 12/10/14 0549 12/10/14 0913  BP:  124/64 132/79   Pulse:  91 92   Temp:  98 F (36.7 C) 98.2 F (36.8 C)   TempSrc:  Oral    Resp:  19 18   Height:      Weight:      SpO2: 97% 96% 94% 96%    Intake/Output Summary (Last 24 hours) at 12/10/14 1256 Last data filed at 12/10/14  0549  Gross per 24 hour  Intake    720 ml  Output    950 ml  Net   -230 ml   Filed Weights   12/08/14 1104 12/08/14 1907  Weight: 51.71 kg (114 lb) 52.164 kg (115 lb)    Exam:  General:  NAD  HEENT: no scleral icterus  Cardiovascular: RRR without murmurs, good pulses, no edems  Respiratory: minimal wheezing, significantly decreased breath sounds throughout  Abdomen: soft, non tender  MSK/Extremities: no clubbing/cyanosis   Skin: no rashes  Neuro: non focal   Data Reviewed: Basic Metabolic Panel:  Recent Labs Lab 12/08/14 1135 12/09/14 0540 12/10/14 0532  NA 140 142 143  K 2.9* 3.5 3.1*  CL 93* 96 97  CO2 34* 35* 36*  GLUCOSE 149* 108* 92  BUN 10 9 13   CREATININE 0.49* 0.51 0.55  CALCIUM 9.8 9.7 9.6   CBC:  Recent Labs Lab 12/08/14 1135 12/09/14 0540 12/10/14 0532  WBC 15.9* 14.7* 11.2*  HGB 12.4 10.9* 10.7*  HCT 38.9 35.1* 35.0*  MCV 71.0* 72.8* 72.2*  PLT 301 344 352   Cardiac Enzymes:  Recent Labs Lab 12/08/14 1135  TROPONINI <0.03   ProBNP (last 3 results)  Recent Labs  07/04/14 1230 09/14/14 1246  PROBNP 19.0 49.2   Recent Results (from the past 240 hour(s))  Culture, blood (routine x  2) Call MD if unable to obtain prior to antibiotics being given     Status: None (Preliminary result)   Collection Time: 12/08/14  9:24 PM  Result Value Ref Range Status   Specimen Description BLOOD RIGHT ASSIST CONTROL  Final   Special Requests BOTTLES DRAWN AEROBIC AND ANAEROBIC 5CC EACH  Final   Culture   Final           BLOOD CULTURE RECEIVED NO GROWTH TO DATE CULTURE WILL BE HELD FOR 5 DAYS BEFORE ISSUING A FINAL NEGATIVE REPORT Performed at [facility]    Report Status PENDING  Incomplete  Culture, blood (routine x 2) Call MD if unable to obtain prior to antibiotics being given     Status: None (Preliminary result)   Collection Time: 12/08/14  9:29 PM  Result Value Ref Range Status   Specimen Description BLOOD LEFT ASSIST CONTROL   Final   Special Requests BOTTLES DRAWN AEROBIC AND ANAEROBIC 5CC EACH  Final   Culture   Final           BLOOD CULTURE RECEIVED NO GROWTH TO DATE CULTURE WILL BE HELD FOR 5 DAYS BEFORE ISSUING A FINAL NEGATIVE REPORT Performed at [facility]    Report Status PENDING  Incomplete  Culture, sputum-assessment     Status: None   Collection Time: 12/09/14  3:50 AM  Result Value Ref Range Status   Specimen Description SPUTUM  Final   Special Requests NONE  Final   Sputum evaluation   Final    MICROSCOPIC FINDINGS SUGGEST THAT THIS SPECIMEN IS NOT REPRESENTATIVE OF LOWER RESPIRATORY SECRETIONS. PLEASE RECOLLECT. CALLED A.ESPRERA RN [phone] 12/09/14 E.GADDY    Report Status 12/09/2014 FINAL  Final  Culture, expectorated sputum-assessment     Status: None   Collection Time: 12/09/14 11:26 PM  Result Value Ref Range Status   Specimen Description SPU  Final   Special Requests NONE  Final   Sputum evaluation   Final    THIS SPECIMEN IS ACCEPTABLE. RESPIRATORY CULTURE REPORT TO FOLLOW.   Report Status 12/10/2014 FINAL  Final     Scheduled Meds: . azithromycin  500 mg Oral Q24H  . budesonide  0.25 mg Nebulization BID  . cefTRIAXone (ROCEPHIN)  IV  1 g Intravenous Q24H  . [START ON 12/11/2014] cholecalciferol  1,000 Units Oral Daily  . famotidine  40 mg Oral QHS  . formoterol  12 mcg Inhalation BID  . hydrochlorothiazide  12.5 mg Oral Daily  . predniSONE  50 mg Oral Q breakfast  . rivaroxaban  20 mg Oral Q breakfast  . verapamil  240 mg Oral Daily   Continuous Infusions:   Marzetta Board, MD [facility] Pager [phone]. If 7 PM - 7 AM, please contact night-coverage at [email or web address], password [facility] 12/10/2014, 12:56 PM  LOS: 2 days

## 2014-12-10 NOTE — Progress Notes (Signed)
UR Completed.  [phone]  

## 2014-12-11 ENCOUNTER — Telehealth: Payer: Self-pay | Admitting: Family

## 2014-12-11 LAB — LEGIONELLA ANTIGEN, URINE

## 2014-12-11 LAB — BASIC METABOLIC PANEL
Anion gap: 3 — ABNORMAL LOW (ref 5–15)
BUN: 12 mg/dL (ref 6–23)
CO2: 44 mmol/L (ref 19–32)
CREATININE: 0.6 mg/dL (ref 0.50–1.10)
Calcium: 9.6 mg/dL (ref 8.4–10.5)
Chloride: 96 mmol/L (ref 96–112)
GFR calc Af Amer: >90 mL/min (ref >90)
GFR, EST NON AFRICAN AMERICAN: 86 mL/min — AB (ref >90)
GLUCOSE: 98 mg/dL (ref 70–99)
POTASSIUM: 3.9 mmol/L (ref 3.5–5.1)
Sodium: 143 mmol/L (ref 135–145)

## 2014-12-11 MED ORDER — GUAIFENESIN 100 MG/5ML PO SOLN: 5.0000 mL | ORAL | Status: DC | PRN

## 2014-12-11 MED ORDER — PREDNISONE 20 MG PO TABS: ORAL_TABLET | ORAL | Status: DC

## 2014-12-11 MED ORDER — LEVOFLOXACIN 500 MG PO TABS: 500.0000 mg | ORAL_TABLET | Freq: Every day | ORAL | Status: DC

## 2014-12-11 NOTE — Progress Notes (Signed)
Discussed discharge summary with patient. Reviewed all medications with patient. Patient received Rx. Patient ready for discharge and waiting for son to come pick her up.

## 2014-12-11 NOTE — Discharge Instructions (Signed)
Follow with Nance Pear., NP in 5-7 days  Please get a complete blood count and chemistry panel checked by your Primary MD at your next visit, and again as instructed by your Primary MD. Please get your medications reviewed and adjusted by your Primary MD.  Please request your Primary MD to go over all Hospital Tests and Procedure/Radiological results at the follow up, please get all Hospital records sent to your Prim MD by signing hospital release before you go home.  If you had Pneumonia of Lung problems at the Hospital: Please get a 2 view Chest X ray done in 6-8 weeks after hospital discharge or sooner if instructed by your Primary MD.  If you have Congestive Heart Failure: Please call your Cardiologist or Primary MD anytime you have any of the following symptoms:  1) 3 pound weight gain in 24 hours or 5 pounds in 1 week  2) shortness of breath, with or without a dry hacking cough  3) swelling in the hands, feet or stomach  4) if you have to sleep on extra pillows at night in order to breathe  Follow cardiac low salt diet and 1.5 lit/day fluid restriction.  If you have diabetes Accuchecks 4 times/day, Once in AM empty stomach and then before each meal. Log in all results and show them to your primary doctor at your next visit. If any glucose reading is under 80 or above 300 call your primary MD immediately.  If you have Seizure/Convulsions/Epilepsy: Please do not drive, operate heavy machinery, participate in activities at heights or participate in high speed sports until you have seen by Primary MD or a Neurologist and advised to do so again.  If you had Gastrointestinal Bleeding: Please ask your Primary MD to check a complete blood count within one week of discharge or at your next visit. Your endoscopic/colonoscopic biopsies that are pending at the time of discharge, will also need to followed by your Primary MD.  Get Medicines reviewed and adjusted. Please take all your  medications with you for your next visit with your Primary MD  Please request your Primary MD to go over all hospital tests and procedure/radiological results at the follow up, please ask your Primary MD to get all Hospital records sent to his/her office.  If you experience worsening of your admission symptoms, develop shortness of breath, life threatening emergency, suicidal or homicidal thoughts you must seek medical attention immediately by calling 911 or calling your MD immediately  if symptoms less severe.  You must read complete instructions/literature along with all the possible adverse reactions/side effects for all the Medicines you take and that have been prescribed to you. Take any new Medicines after you have completely understood and accpet all the possible adverse reactions/side effects.   Do not drive or operate heavy machinery when taking Pain medications.   Do not take more than prescribed Pain, Sleep and Anxiety Medications  Special Instructions: If you have smoked or chewed Tobacco  in the last 2 yrs please stop smoking, stop any regular Alcohol  and or any Recreational drug use.  Wear Seat belts while driving.  Please note You were cared for by a hospitalist during your hospital stay. If you have any questions about your discharge medications or the care you received while you were in the hospital after you are discharged, you can call the unit and asked to speak with the hospitalist on call if the hospitalist that took care of you is not available. Once  you are discharged, your primary care physician will handle any further medical issues. Please note that NO REFILLS for any discharge medications will be authorized once you are discharged, as it is imperative that you return to your primary care physician (or establish a relationship with a primary care physician if you do not have one) for your aftercare needs so that they can reassess your need for medications and monitor your  lab values.  You can reach the hospitalist office at phone [phone] or fax [phone]   If you do not have a primary care physician, you can call [phone] for a physician referral.  Activity: As tolerated with Full fall precautions use walker/cane & assistance as needed  Diet: regular  Disposition Home

## 2014-12-11 NOTE — Discharge Summary (Signed)
Physician Discharge Summary  Holly Page [id number]:[id number] DOB: December 08, 1935 DOA: 12/08/2014  PCP: Nance Pear., NP  Admit date: 12/08/2014 Discharge date: 12/11/2014  Time spent: 45 minutes  Recommendations for Outpatient Follow-up:  1. Follow up with PCP in 2-4 weeks 2. Follow up with [name] in Pulmonology in 3 days 3. Patient discharged on a Prednisone taper and levofloxacin  Discharge Diagnoses:  Principal Problem:   Lingular pneumonia Active Problems:   COPD with emphysema Gold D   Acute on chronic respiratory failure with hypoxia   CAP (community acquired pneumonia)   Hypokalemia   Leukocytosis  Discharge Condition: stable  Diet recommendation: regular  Filed Weights   12/08/14 1104 12/08/14 1907  Weight: 51.71 kg (114 lb) 52.164 kg (115 lb)   History of present illness:  Holly Page is a 79 y.o. female h/o COPD, on 2L home O2 at baseline. She presents to the ED at [facility] with cough, now productive of purulent sputum. This has been worsening especially over the last 2 days. Has had subjective fever at home, decreased appetite, one episode of vomiting yesterday. PCP has had her on steroid taper which hasnt been helping as much as she would like.  Hospital Course:  CAP - patient was admitted with CAP as evidenced by symptoms and a CXR with suspect lingular pneumonia. She was started on Ceftriaxone and Azithromycin with good clinical improvement. Her antibiotics were changed to levofloxacin on discharge.  COPD exacerbation - she was started on nebs, antibiotics and steroids, improved, her steroids were transitioned to Prednisone and she will be discharged with ~ a week taper. She has close follow up in 3 days in Pulmonary clinic, stressed the importance for the patient not to miss this appointment. She was able to ambulate in the hallway with minimal assistance without significant dyspnea/chest pain. Acute on chronic respiratory failure - due to #1  and #2, at baseline on discharge Leukocytosis - due to steroids, #1 RLE DVT- continue Xarelto, supposed to have a repeat DVT US in April, has follow up with Dr. Marin Olp  Procedures:  None    Consultations:  None   Discharge Exam: Filed Vitals:   12/10/14 0913 12/10/14 1315 12/10/14 2118 12/11/14 0607  BP:  145/70 147/90 149/78  Pulse:  78 84 71  Temp:  98.1 F (36.7 C) 98 F (36.7 C) 97.6 F (36.4 C)  TempSrc:  Oral Oral Oral  Resp:  18 18 18   Height:      Weight:      SpO2: 96% 98% 97% 98%   General: NAD Cardiovascular: RRR Respiratory: CTA biL  Discharge Instructions    Medication List    STOP taking these medications        guaiFENesin 600 MG 12 hr tablet  Commonly known as:  MUCINEX  Replaced by:  guaiFENesin 100 MG/5ML Soln      TAKE these medications        acetaminophen 325 MG tablet  Commonly known as:  TYLENOL  Take 2 tablets (650 mg total) by mouth every 6 (six) hours as needed.     albuterol (2.5 MG/3ML) 0.083% nebulizer solution  Commonly known as:  PROVENTIL  Take 2.5 mg by nebulization every 6 (six) hours as needed for shortness of breath. And as needed     albuterol 108 (90 BASE) MCG/ACT inhaler  Commonly known as:  PROVENTIL HFA;VENTOLIN HFA  Inhale 2 puffs into the lungs every 6 (six) hours as needed for wheezing or shortness  of breath.     budesonide 0.25 MG/2ML nebulizer solution  Commonly known as:  PULMICORT  One twice daily     docusate sodium 100 MG capsule  Commonly known as:  COLACE  Take 100 mg by mouth 2 (two) times daily as needed.     formoterol 20 MCG/2ML nebulizer solution  Commonly known as:  PERFOROMIST  Take 2 mLs (20 mcg total) by nebulization 2 (two) times daily. Use in nebulizer twice daily perfectly regularly     guaiFENesin 100 MG/5ML Soln  Commonly known as:  ROBITUSSIN  Take 5 mLs (100 mg total) by mouth every 4 (four) hours as needed for cough or to loosen phlegm.     levofloxacin 500 MG tablet    Commonly known as:  LEVAQUIN  Take 1 tablet (500 mg total) by mouth daily.     MICROZIDE 12.5 MG capsule  Generic drug:  hydrochlorothiazide  Take 12.5 mg by mouth daily. Pt takes according to BP reading.     predniSONE 20 MG tablet  Commonly known as:  DELTASONE  Take 2 each am x 3 days, 1 each am x 3 days, 1/2 each am x 4 days and stop     ranitidine 300 MG tablet  Commonly known as:  ZANTAC  Take 300 mg by mouth at bedtime. 1 tablet at bedtime     rivaroxaban 20 MG Tabs tablet  Commonly known as:  XARELTO  Take 1 tablet (20 mg total) by mouth daily with supper.     sodium chloride 0.65 % Soln nasal spray  Commonly known as:  OCEAN  Place 2 sprays into both nostrils as needed for congestion.     verapamil 240 MG CR tablet  Commonly known as:  CALAN-SR  TAKE 1 TABLET BY MOUTH EVERY DAY     Vitamin D-3 5000 UNITS Tabs  Take 1 tablet by mouth daily.           Follow-up Information    Follow up with PARRETT,TAMMY, NP In 3 days.   Specialty:  Nurse Practitioner   Contact information:   [address] [place] [address] [phone]       The results of significant diagnostics from this hospitalization (including imaging, microbiology, ancillary and laboratory) are listed below for reference.    Significant Diagnostic Studies: Dg Chest 2 View (if Patient Has Fever And/or Copd)  12/08/2014   CLINICAL DATA:  Shortness of breath and cough  EXAM: CHEST  2 VIEW  COMPARISON:  07/04/2014  FINDINGS: Heart size is normal. No pleural effusion or edema. The lungs are hyperinflated with coarsened interstitial markings bilaterally compatible with emphysema. Anterior lung opacity identified on the lateral radiograph suspicious for lingular pneumonia.  IMPRESSION: 1. Suspect lingular pneumonia. 2. Advanced changes of emphysema.   Electronically Signed   By: Kerby Moors M.D.   On: 12/08/2014 12:11    Microbiology: Recent Results (from the past 240 hour(s))  Culture, blood  (routine x 2) Call MD if unable to obtain prior to antibiotics being given     Status: None (Preliminary result)   Collection Time: 12/08/14  9:24 PM  Result Value Ref Range Status   Specimen Description BLOOD RIGHT ASSIST CONTROL  Final   Special Requests BOTTLES DRAWN AEROBIC AND ANAEROBIC 5CC EACH  Final   Culture   Final           BLOOD CULTURE RECEIVED NO GROWTH TO DATE CULTURE WILL BE HELD FOR 5 DAYS BEFORE ISSUING  A FINAL NEGATIVE REPORT Performed at [facility]    Report Status PENDING  Incomplete  Culture, blood (routine x 2) Call MD if unable to obtain prior to antibiotics being given     Status: None (Preliminary result)   Collection Time: 12/08/14  9:29 PM  Result Value Ref Range Status   Specimen Description BLOOD LEFT ASSIST CONTROL  Final   Special Requests BOTTLES DRAWN AEROBIC AND ANAEROBIC 5CC EACH  Final   Culture   Final           BLOOD CULTURE RECEIVED NO GROWTH TO DATE CULTURE WILL BE HELD FOR 5 DAYS BEFORE ISSUING A FINAL NEGATIVE REPORT Performed at [facility]    Report Status PENDING  Incomplete  Culture, sputum-assessment     Status: None   Collection Time: 12/09/14  3:50 AM  Result Value Ref Range Status   Specimen Description SPUTUM  Final   Special Requests NONE  Final   Sputum evaluation   Final    MICROSCOPIC FINDINGS SUGGEST THAT THIS SPECIMEN IS NOT REPRESENTATIVE OF LOWER RESPIRATORY SECRETIONS. PLEASE RECOLLECT. CALLED A.ESPRERA RN [phone] 12/09/14 E.GADDY    Report Status 12/09/2014 FINAL  Final  Culture, expectorated sputum-assessment     Status: None   Collection Time: 12/09/14 11:26 PM  Result Value Ref Range Status   Specimen Description SPU  Final   Special Requests NONE  Final   Sputum evaluation   Final    THIS SPECIMEN IS ACCEPTABLE. RESPIRATORY CULTURE REPORT TO FOLLOW.   Report Status 12/10/2014 FINAL  Final  Culture, respiratory (NON-Expectorated)     Status: None (Preliminary result)   Collection Time: 12/09/14  11:26 PM  Result Value Ref Range Status   Specimen Description SPU  Final   Special Requests NONE  Final   Gram Stain   Final    ABUNDANT WBC PRESENT, PREDOMINANTLY PMN NO SQUAMOUS EPITHELIAL CELLS SEEN RARE GRAM POSITIVE COCCI IN PAIRS Performed at [facility]    Culture   Final    Culture reincubated for better growth Performed at [facility]    Report Status PENDING  Incomplete     Labs: Basic Metabolic Panel:  Recent Labs Lab 12/08/14 1135 12/09/14 0540 12/10/14 0532 12/11/14 0521  NA 140 142 143 143  K 2.9* 3.5 3.1* 3.9  CL 93* 96 97 96  CO2 34* 35* 36* 44*  GLUCOSE 149* 108* 92 98  BUN 10 9 13 12   CREATININE 0.49* 0.51 0.55 0.60  CALCIUM 9.8 9.7 9.6 9.6   CBC:  Recent Labs Lab 12/08/14 1135 12/09/14 0540 12/10/14 0532  WBC 15.9* 14.7* 11.2*  HGB 12.4 10.9* 10.7*  HCT 38.9 35.1* 35.0*  MCV 71.0* 72.8* 72.2*  PLT 301 344 352   Cardiac Enzymes:  Recent Labs Lab 12/08/14 1135  TROPONINI <0.03    ProBNP (last 3 results)  Recent Labs  07/04/14 1230 09/14/14 1246  PROBNP 19.0 49.2    Signed:  Clara Herbison  [facility] 12/11/2014, 4:59 PM

## 2014-12-11 NOTE — Telephone Encounter (Signed)
Please contact pt to arrange a hospital follow up.

## 2014-12-12 NOTE — Telephone Encounter (Signed)
Called patient to arrange hospital follow-up.  Patient states she is feeling much better.  She is wearing 2L of oxygen continuously and still gets short of breath if she is ambulating longer distances, but it is relieved with rest.  She has started taking her antibiotics.  Appointment scheduled for 12/19/14.

## 2014-12-13 LAB — CULTURE, RESPIRATORY

## 2014-12-13 LAB — CULTURE, RESPIRATORY W GRAM STAIN

## 2014-12-14 ENCOUNTER — Ambulatory Visit (INDEPENDENT_AMBULATORY_CARE_PROVIDER_SITE_OTHER)
Admission: RE | Admit: 2014-12-14 | Discharge: 2014-12-14 | Disposition: A | Discharge status: Home / Self Care | Payer: Medicare Other | Source: Ambulatory Visit | Attending: Internal Medicine | Admitting: Internal Medicine

## 2014-12-14 ENCOUNTER — Encounter: Payer: Self-pay | Admitting: Adult Health

## 2014-12-14 ENCOUNTER — Ambulatory Visit: Payer: Medicare Other | Admitting: Family Medicine

## 2014-12-14 ENCOUNTER — Ambulatory Visit (INDEPENDENT_AMBULATORY_CARE_PROVIDER_SITE_OTHER): Payer: Medicare Other | Admitting: Adult Health

## 2014-12-14 VITALS — BP 114/66 | HR 73 | Temp 98.3°F | Ht 65.0 in | Wt 113.2 lb

## 2014-12-14 DIAGNOSIS — J189 Pneumonia, unspecified organism: Secondary | ICD-10-CM

## 2014-12-14 DIAGNOSIS — J439 Emphysema, unspecified: Secondary | ICD-10-CM

## 2014-12-14 MED ORDER — BUDESONIDE 0.25 MG/2ML IN SUSP: 0.2500 mg | Freq: Two times a day (BID) | RESPIRATORY_TRACT | Status: DC

## 2014-12-14 MED ORDER — FORMOTEROL FUMARATE 20 MCG/2ML IN NEBU: 20.0000 ug | INHALATION_SOLUTION | Freq: Two times a day (BID) | RESPIRATORY_TRACT | Status: DC

## 2014-12-14 NOTE — Patient Instructions (Signed)
STOP ANORO  Continue on Budesonide and Perforomist Neb Twice daily  (make sure to take twice daily )  Chest xray today  follow up Dr. Joya Gaskins  In 6 weeks and As needed   Please contact office for sooner follow up if symptoms do not improve or worsen or seek emergency care

## 2014-12-14 NOTE — Addendum Note (Signed)
Addended by: Parke Poisson E on: 12/14/2014 04:27 PM   Modules accepted: Orders

## 2014-12-14 NOTE — Assessment & Plan Note (Signed)
Recent Lingula PNA -clincally improved    Plan  Chest xray today  follow up Dr. Joya Gaskins  In 6 weeks and As needed   Please contact office for sooner follow up if symptoms do not improve or worsen or seek emergency care

## 2014-12-14 NOTE — Progress Notes (Signed)
Subjective:    Patient ID: Holly Page, female    DOB: [birth year], 79 y.o.   MRN: [id number]  HPI  79 y.o. F with Gold C Copd primary emphysema on O2   12/14/2014 [facility] follow up  Returns for post hospital follow up  Admitted 3/11-14 for lingular PNA . And AECOPD  Tx w/ rocephin/azithro and discharged on Levaquin.and steroid taper.  She is improved .  Confused with her meds.  Her insurance has a high payment with [facility] that she can not afford.  Last ov she was changed to Buedeonide/Perforomist however did not stop ANORO .  She is very aggravated that she believes she was told to take all the meds together.  We discussed the correct regimen to take.  She denies chest pain, orthopnea, edema of fever.   Review of Systems  Constitutional:   No  weight loss, night sweats,  Fevers, chills, + fatigue, or  lassitude.  HEENT:   No headaches,  Difficulty swallowing,  Tooth/dental problems, or  Sore throat,                No sneezing, itching, ear ache, nasal congestion, post nasal drip,   CV:  No chest pain,  Orthopnea, PND, swelling in lower extremities, anasarca, dizziness, palpitations, syncope.   GI  Notes heartburn, no indigestion, abdominal pain, nausea, vomiting, diarrhea, change in bowel habits, loss of appetite, bloody stools.   Resp:  No excess mucus, no productive cough,  Notes non-productive cough,  No coughing up of blood.  No change in color of mucus.  No wheezing.  No chest wall deformity  Skin: no rash or lesions.  GU: no dysuria, change in color of urine, no urgency or frequency.  No flank pain, no hematuria   MS:  No joint pain or swelling.  No decreased range of motion.  No back pain.  Psych:  No change in mood or affect. No depression or anxiety.  No memory loss.      Objective:   Physical Exam  BP 114/66 mmHg  Pulse 73  Temp(Src) 98.3 F (36.8 C) (Oral)  Ht 5\' 5"  (1.651 m)  Wt 113 lb 3.2 oz (51.347 kg)  BMI 18.84 kg/m2  SpO2 95%  GEN:  A/Ox3; pleasant , NAD, elderly and frail   HEENT:  [place]/AT,  EACs-clear, TMs-wnl, NOSE-clear, THROAT-clear, no lesions  NECK:  Supple w/ fair ROM; no JVD; normal carotid impulses w/o bruits; no thyromegaly or nodules palpated; no lymphadenopathy.  RESP  Diminished in bases w/ no wheezing or rhonchi.no accessory muscle use, no dullness to percussion  CARD:  RRR, no m/r/g  , no peripheral edema, pulses intact, no cyanosis or clubbing.  GI:   Soft & nt; nml bowel sounds; no organomegaly or masses detected.  Musco: Warm bil, no deformities or joint swelling noted.   Neuro: alert, no focal deficits noted.    Skin: Warm, no lesions or rashes .   Recent Labs Lab 12/08/14 1135 12/09/14 0540 12/10/14 0532  HGB 12.4 10.9* 10.7*  HCT 38.9 35.1* 35.0*  WBC 15.9* 14.7* 11.2*  PLT 301 344 352    No results for input(s): PROBNP in the last 168 hours.   Recent Labs Lab 12/08/14 1135 12/09/14 0540 12/10/14 0532 12/11/14 0521  NA 140 142 143 143  K 2.9* 3.5 3.1* 3.9  CL 93* 96 97 96  CO2 34* 35* 36* 44*  GLUCOSE 149* 108* 92 98  BUN 10 9 13  12  CREATININE 0.49* 0.51 0.55 0.60  CALCIUM 9.8 9.7 9.6 9.6         Assessment & Plan:

## 2014-12-14 NOTE — Assessment & Plan Note (Signed)
Recent Exacerbation - now resolving  Pt can not afford Inhalers - will try to continue Bud/Perforomist Neb  Send to DME to see if affordable otherwise will need to try pt assitance program.   Plan  STOP ANORO  Continue on Budesonide and Perforomist Neb Twice daily  (make sure to take twice daily )  Chest xray today  follow up Dr. Joya Gaskins  In 6 weeks and As needed   Please contact office for sooner follow up if symptoms do not improve or worsen or seek emergency care

## 2014-12-15 ENCOUNTER — Telehealth: Payer: Self-pay | Admitting: Adult Health

## 2014-12-15 LAB — CULTURE, BLOOD (ROUTINE X 2)
Culture: NO GROWTH
Culture: NO GROWTH

## 2014-12-15 NOTE — Telephone Encounter (Signed)
Called and spoke to pt. Pt requesting the frequency to take the Perforomist. Advised pt per TP's last note--take neb BID. Pt verbalized understanding and denied any further questions or concerns at this time.

## 2014-12-19 ENCOUNTER — Ambulatory Visit (INDEPENDENT_AMBULATORY_CARE_PROVIDER_SITE_OTHER): Payer: Medicare Other | Admitting: Family

## 2014-12-19 VITALS — BP 142/74 | HR 82 | Temp 98.1°F | Resp 16 | Ht 65.25 in | Wt 110.0 lb

## 2014-12-19 DIAGNOSIS — D649 Anemia, unspecified: Secondary | ICD-10-CM | POA: Insufficient documentation

## 2014-12-19 DIAGNOSIS — J189 Pneumonia, unspecified organism: Secondary | ICD-10-CM | POA: Diagnosis not present

## 2014-12-19 DIAGNOSIS — E876 Hypokalemia: Secondary | ICD-10-CM | POA: Diagnosis not present

## 2014-12-19 LAB — BASIC METABOLIC PANEL
BUN: 15 mg/dL (ref 6–23)
CO2: 32 meq/L (ref 19–32)
Calcium: 9.5 mg/dL (ref 8.4–10.5)
Chloride: 101 mEq/L (ref 96–112)
Creatinine, Ser: 0.73 mg/dL (ref 0.40–1.20)
GFR: 99.54 mL/min (ref >60.00)
GLUCOSE: 90 mg/dL (ref 70–99)
POTASSIUM: 3.3 meq/L — AB (ref 3.5–5.1)
Sodium: 139 mEq/L (ref 135–145)

## 2014-12-19 LAB — IRON: Iron: 82 ug/dL (ref 42–145)

## 2014-12-19 LAB — FERRITIN: Ferritin: 209.1 ng/mL (ref 10.0–291.0)

## 2014-12-19 NOTE — Progress Notes (Signed)
Subjective:    Patient ID: LERA GAINES, female    DOB: 1936/04/13, 79 y.o.   MRN: [id number]  HPI  Ms. Borrero is a 79 yr old female with COPD who presents today for hospital follow up. She was admitted 3/11-3/14 with pneumonia/acute on chronic respiratory failure with hypoxia. Records are reviewed. She was treated with ceftriazone and azithromycin which was changed to levofloxacin at discharge. She was also treated with steroids which were continued as a taper at discharge. She has one day left of predisone and abx are complete.   Continues to have mild white/clear sputum production. Appetite is improved.     Review of Systems See HPI  Past Medical History  Diagnosis Date  . Pneumonia   . COPD (chronic obstructive pulmonary disease)   . Hypertension   . DVT (deep venous thrombosis) 1980s, recurrent 2015    Right DVT 2015  on xarelto  . GERD (gastroesophageal reflux disease)   . Lactose intolerance   . Spinal stenosis   . Meningioma     followed by Dr Christella Noa    History   Social History  . Marital Status: Divorced    Spouse Name: N/A  . Number of Children: 4  . Years of Education: college   Occupational History  . Retired    Social History Main Topics  . Smoking status: Former Smoker -- 1.50 packs/day for 56 years    Types: Cigarettes    Quit date: 04/29/2012  . Smokeless tobacco: Never Used     Comment: quit smoking 3 years ago  . Alcohol Use: No     Comment: quit drinking beer 2005  . Drug Use: No  . Sexual Activity: Not on file   Other Topics Concern  . Not on file   Social History Narrative   Patient lives at home alone- divorced, no pets.     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 Foot surgery    .  Cholecystectomy    . Tonsillectomy    . Tubal ligation      Family History  Problem Relation Age of Onset  . Lupus Sister   . COPD Father     smoker deceased.   [name] Heart attack Son     Allergies  Allergen Reactions  . Tramadol Shortness Of Breath  . Amlodipine     Other reaction(s): SWELLING  . Aspirin     Other reaction(s): PALPITATIONS Other reaction(s): DIFFICULTY BREATHING Heart flutter  . Codeine Other (See Comments)    Hallucinations, can take Tylenol #3 now  . Doxycycline     Other reaction(s): NAUSEA,VOMITING  . Hydrocodone     hallucinations  . Losartan     unknown  . Propoxyphene Nausea Only    Other reaction(s): NAUSEA  . Tiotropium Itching and Rash    Current Outpatient Prescriptions on File Prior to Visit  Medication Sig Dispense Refill  . acetaminophen (TYLENOL) 325 MG tablet Take 2 tablets (650 mg total) by mouth every 6 (six) hours as needed. (Patient taking differently: Take 650 mg by mouth every 6 (six) hours as needed for mild pain (back pain). ) 15 tablet  0  . albuterol (PROVENTIL HFA;VENTOLIN HFA) 108 (90 BASE) MCG/ACT inhaler Inhale 2 puffs into the lungs every 6 (six) hours as needed for wheezing or shortness of breath. 1 Inhaler 3  . albuterol (PROVENTIL) (2.5 MG/3ML) 0.083% nebulizer solution Take 2.5 mg by nebulization every 6 (six) hours as needed for shortness of breath. And as needed    . budesonide (PULMICORT) 0.25 MG/2ML nebulizer solution Take 2 mLs (0.25 mg total) by nebulization 2 (two) times daily. DX: J43.9 120 mL 12  . Cholecalciferol (VITAMIN D-3) 5000 UNITS TABS Take 1 tablet by mouth daily.    [name] docusate sodium (COLACE) 100 MG capsule Take 100 mg by mouth 2 (two) times daily as needed.    . formoterol (PERFOROMIST) 20 MCG/2ML nebulizer solution Take 2 mLs (20 mcg total) by nebulization 2 (two) times daily. DX: J43.9 120 mL 12  . guaiFENesin (ROBITUSSIN) 100 MG/5ML SOLN Take 5 mLs (100 mg total) by mouth every 4 (four) hours as needed  for cough or to loosen phlegm. 473 mL 0  . hydrochlorothiazide (MICROZIDE) 12.5 MG capsule Take 12.5 mg by mouth daily. Pt takes according to BP reading.    . predniSONE (DELTASONE) 20 MG tablet Take 2 each am x 3 days, 1 each am x 3 days, 1/2 each am x 4 days and stop 11 tablet 0  . ranitidine (ZANTAC) 300 MG tablet Take 300 mg by mouth at bedtime. 1 tablet at bedtime    . rivaroxaban (XARELTO) 20 MG TABS tablet Take 1 tablet (20 mg total) by mouth daily with supper. (Patient taking differently: Take 20 mg by mouth daily with breakfast. ) 30 tablet 0  . sodium chloride (OCEAN) 0.65 % SOLN nasal spray Place 2 sprays into both nostrils as needed for congestion.    . verapamil (CALAN-SR) 240 MG CR tablet TAKE 1 TABLET BY MOUTH EVERY DAY 30 tablet 2   No current facility-administered medications on file prior to visit.    BP 142/74 mmHg  Pulse 82  Temp(Src) 98.1 F (36.7 C) (Oral)  Resp 16  Ht 5' 5.25" (1.657 m)  Wt 110 lb (49.896 kg)  BMI 18.17 kg/m2  SpO2 95%       Objective:   Physical Exam  Constitutional: She is oriented to person, place, and time. She appears well-developed and well-nourished. No distress.  Cardiovascular: Normal rate and regular rhythm.   No murmur heard. Pulmonary/Chest: Effort normal. She has no wheezes. She has no rales.  Breath sounds are diminished throughout  Musculoskeletal: She exhibits no edema.  Neurological: She is alert and oriented to person, place, and time.  Skin: Skin is warm and dry.  Psychiatric: She has a normal mood and affect. Her behavior is normal. Judgment and thought content normal.          Assessment & Plan:  Mild Hypokalemia while hospitalized- obtain follow up bmet.

## 2014-12-19 NOTE — Assessment & Plan Note (Signed)
Microcytic. Noted on hospital labs. Will have pt complete and IFOB kit as well as iron studies.

## 2014-12-19 NOTE — Progress Notes (Signed)
Quick Note:  Called spoke with patient, advised of cxr results / recs as stated by TP. Pt verbalized her understanding and denied any questions. ______ 

## 2014-12-19 NOTE — Assessment & Plan Note (Signed)
Clinically resolved.  Had CXR performed with pulmonology on 3/17 which was negative for infiltrate. She is now on budesonide and formoterol. Continue same, pt to call if symptoms worsen or if symptoms do not continue to improve.

## 2014-12-19 NOTE — Patient Instructions (Signed)
Please complete lab work prior to leaving. Complete stool kit an mail back at your earliest convenience. Follow up in 3 months- call sooner if symptoms do not continue to improve.

## 2014-12-19 NOTE — Progress Notes (Signed)
Pre visit review using our clinic review tool, if applicable. No additional management support is needed unless otherwise documented below in the visit note. 

## 2014-12-20 ENCOUNTER — Telehealth: Payer: Self-pay | Admitting: Critical Care Medicine

## 2014-12-20 NOTE — Telephone Encounter (Signed)
Per 3.17.16 ov w/ TP: COPD with emphysema Gold D - Tammy S Parrett, NP at 12/14/2014 12:58 PM     Status: Written Related Problem: COPD with emphysema Gold D   Expand All Collapse All   Recent Exacerbation - now resolving  Pt can not afford Inhalers - will try to continue Bud/Perforomist Neb  Send to DME to see if affordable otherwise will need to try pt assitance program.   Plan  STOP ANORO  Continue on Budesonide and Perforomist Neb Twice daily (make sure to take twice daily )  Chest xray today  follow up Dr. Joya Gaskins In 6 weeks and As needed  Please contact office for sooner follow up if symptoms do not improve or worsen or seek emergency care        Order was sent to [facility] for the nebs - would check with them to check on status of nebs.  Thanks.

## 2014-12-20 NOTE — Telephone Encounter (Signed)
Patient says she is almost out of her Performist and Budesonide.  She said that Tammy told her at the last OV that she was going to get her enrolled in a "part b/d?" program and that she would be able to get her medications through that program.  I asked patient if she had filled out any paperwork and she said that Tammy told her they would take care of it, that she didn't have to fill out anything.  I advised patient I would ask Tammy's nurse about this and get back to her.   Jess,  Are you familiar with this?  Please advise.

## 2014-12-20 NOTE — Telephone Encounter (Signed)
Spoke with Maudie Mercury at [facility]. States that pt's medication was shipped out to her on 12/19/14. She tracked the package and it should arrive to the pt on 12/21/14. Advised pt to of this information. Nothing further was needed.

## 2014-12-20 NOTE — Addendum Note (Signed)
Addended by: Modena Morrow D on: 12/20/2014 09:07 AM   Modules accepted: Orders

## 2014-12-21 ENCOUNTER — Telehealth: Payer: Self-pay | Admitting: Family

## 2014-12-21 DIAGNOSIS — E876 Hypokalemia: Secondary | ICD-10-CM

## 2014-12-21 LAB — IRON AND TIBC
%SAT: 19 % — ABNORMAL LOW (ref 20–55)
Iron: 66 ug/dL (ref 42–145)
TIBC: 342 ug/dL (ref 250–470)
UIBC: 276 ug/dL (ref 125–400)

## 2014-12-21 MED ORDER — POTASSIUM CHLORIDE CRYS ER 20 MEQ PO TBCR: 20.0000 meq | EXTENDED_RELEASE_TABLET | Freq: Every day | ORAL | Status: DC

## 2014-12-21 NOTE — Addendum Note (Signed)
Addended by: Peggyann Shoals on: 12/21/2014 11:32 AM   Modules accepted: Orders

## 2014-12-21 NOTE — Telephone Encounter (Signed)
Potassium is low. Add Kdur once daily repeat bmet in 1 week, dx hypokalemia

## 2014-12-21 NOTE — Telephone Encounter (Signed)
Notified pt and she voices understanding. Lab appt scheduled for 12/28/14 at 1:15pm.  Future order entered.

## 2014-12-22 ENCOUNTER — Telehealth: Payer: Self-pay | Admitting: Family

## 2014-12-22 NOTE — Telephone Encounter (Signed)
Iron mildly low. Add multivitamin with minerals once daily. I would also like her to complete IFOB dx anemia.

## 2014-12-26 NOTE — Telephone Encounter (Signed)
Patient informed of results and PCP instructions.  Patient did verbalize an understanding.  Patient stated she was given an IFOB at her OV.

## 2014-12-28 ENCOUNTER — Other Ambulatory Visit (INDEPENDENT_AMBULATORY_CARE_PROVIDER_SITE_OTHER): Payer: Medicare Other

## 2014-12-28 DIAGNOSIS — E876 Hypokalemia: Secondary | ICD-10-CM | POA: Diagnosis not present

## 2014-12-28 LAB — BASIC METABOLIC PANEL
BUN: 16 mg/dL (ref 6–23)
CO2: 30 mEq/L (ref 19–32)
CREATININE: 0.84 mg/dL (ref 0.40–1.20)
Calcium: 9.7 mg/dL (ref 8.4–10.5)
Chloride: 105 mEq/L (ref 96–112)
GFR: 84.65 mL/min (ref >60.00)
Glucose, Bld: 95 mg/dL (ref 70–99)
Potassium: 4.4 mEq/L (ref 3.5–5.1)
Sodium: 141 mEq/L (ref 135–145)

## 2014-12-28 NOTE — Addendum Note (Signed)
Addended by: Harl Bowie on: 12/28/2014 01:07 PM   Modules accepted: Orders

## 2014-12-29 ENCOUNTER — Encounter: Payer: Self-pay | Admitting: Hematology & Oncology

## 2014-12-29 ENCOUNTER — Ambulatory Visit (HOSPITAL_BASED_OUTPATIENT_CLINIC_OR_DEPARTMENT_OTHER)
Admission: RE | Admit: 2014-12-29 | Discharge: 2014-12-29 | Disposition: A | Discharge status: Home / Self Care | Payer: Medicare Other | Source: Ambulatory Visit | Attending: Hematology & Oncology | Admitting: Hematology & Oncology

## 2014-12-29 ENCOUNTER — Other Ambulatory Visit (HOSPITAL_BASED_OUTPATIENT_CLINIC_OR_DEPARTMENT_OTHER): Payer: Medicare Other

## 2014-12-29 ENCOUNTER — Ambulatory Visit (HOSPITAL_BASED_OUTPATIENT_CLINIC_OR_DEPARTMENT_OTHER): Payer: Medicare Other | Admitting: Hematology & Oncology

## 2014-12-29 VITALS — BP 145/74 | HR 86 | Temp 98.1°F | Resp 18 | Wt 113.0 lb

## 2014-12-29 DIAGNOSIS — I82401 Acute embolism and thrombosis of unspecified deep veins of right lower extremity: Secondary | ICD-10-CM

## 2014-12-29 DIAGNOSIS — I82511 Chronic embolism and thrombosis of right femoral vein: Secondary | ICD-10-CM | POA: Insufficient documentation

## 2014-12-29 DIAGNOSIS — Z86718 Personal history of other venous thrombosis and embolism: Secondary | ICD-10-CM

## 2014-12-29 DIAGNOSIS — J449 Chronic obstructive pulmonary disease, unspecified: Secondary | ICD-10-CM

## 2014-12-29 LAB — CBC WITH DIFFERENTIAL (CANCER CENTER ONLY)
BASO#: 0 10*3/uL (ref 0.0–0.2)
BASO%: 0.2 % (ref 0.0–2.0)
EOS%: 1.9 % (ref 0.0–7.0)
Eosinophils Absolute: 0.1 10*3/uL (ref 0.0–0.5)
HCT: 35.7 % (ref 34.8–46.6)
HGB: 11.2 g/dL — ABNORMAL LOW (ref 11.6–15.9)
LYMPH#: 2.1 10*3/uL (ref 0.9–3.3)
LYMPH%: 36.2 % (ref 14.0–48.0)
MCH: 22.4 pg — AB (ref 26.0–34.0)
MCHC: 31.4 g/dL — ABNORMAL LOW (ref 32.0–36.0)
MCV: 71 fL — AB (ref 81–101)
MONO#: 0.5 10*3/uL (ref 0.1–0.9)
MONO%: 8.8 % (ref 0.0–13.0)
NEUT#: 3.1 10*3/uL (ref 1.5–6.5)
NEUT%: 52.9 % (ref 39.6–80.0)
Platelets: 351 10*3/uL (ref 145–400)
RBC: 5 10*6/uL (ref 3.70–5.32)
RDW: 17.5 % — AB (ref 11.1–15.7)
WBC: 5.8 10*3/uL (ref 3.9–10.0)

## 2014-12-29 NOTE — Progress Notes (Signed)
Hematology and Oncology Follow Up Visit  Holly Page [id number] 1936-01-25 79 y.o. 12/29/2014   Principle Diagnosis:   Thromboembolic disease of the right thigh  Current Therapy:    Xarelto 20 mg by mouth completed 6 months at the end of March 2016     Interim History:  Holly Page is back for follow-up. She is doing okay. She has an awful COPD. She is on chronic oxygen.  She has completed the Xarelto. I told her to finish what she has left.  We did go ahead and do a Doppler of her legs. This was done today. The Doppler of the right leg shows some chronic wall thickening in the right femoral vein. There is nothing that looked acute.  His been no leg swelling. She's had no pain.  She's had no bleeding.   There is no nausea or vomiting.  Medications:  Current outpatient prescriptions:  .  acetaminophen (TYLENOL) 325 MG tablet, Take 2 tablets (650 mg total) by mouth every 6 (six) hours as needed. (Patient taking differently: Take 650 mg by mouth every 6 (six) hours as needed for mild pain (back pain). ), Disp: 15 tablet, Rfl: 0 .  albuterol (PROVENTIL HFA;VENTOLIN HFA) 108 (90 BASE) MCG/ACT inhaler, Inhale 2 puffs into the lungs every 6 (six) hours as needed for wheezing or shortness of breath., Disp: 1 Inhaler, Rfl: 3 .  albuterol (PROVENTIL) (2.5 MG/3ML) 0.083% nebulizer solution, Take 2.5 mg by nebulization every 6 (six) hours as needed for shortness of breath. And as needed, Disp: , Rfl:  .  budesonide (PULMICORT) 0.25 MG/2ML nebulizer solution, Take 2 mLs (0.25 mg total) by nebulization 2 (two) times daily. DX: J43.9, Disp: 120 mL, Rfl: 12 .  Cholecalciferol (VITAMIN D-3) 5000 UNITS TABS, Take 1 tablet by mouth daily., Disp: , Rfl:  .  docusate sodium (COLACE) 100 MG capsule, Take 100 mg by mouth 2 (two) times daily as needed., Disp: , Rfl:  .  formoterol (PERFOROMIST) 20 MCG/2ML nebulizer solution, Take 2 mLs (20 mcg total) by nebulization 2 (two) times daily. DX:  J43.9, Disp: 120 mL, Rfl: 12 .  guaiFENesin (ROBITUSSIN) 100 MG/5ML SOLN, Take 5 mLs (100 mg total) by mouth every 4 (four) hours as needed for cough or to loosen phlegm., Disp: 473 mL, Rfl: 0 .  hydrochlorothiazide (MICROZIDE) 12.5 MG capsule, Take 12.5 mg by mouth daily. Pt takes according to BP reading., Disp: , Rfl:  .  Multiple Vitamins-Minerals (MULTIVITAMIN & MINERAL PO), Take 1 tablet by mouth daily., Disp: , Rfl:  .  potassium chloride SA (K-DUR,KLOR-CON) 20 MEQ tablet, Take 1 tablet (20 mEq total) by mouth daily., Disp: 30 tablet, Rfl: 3 .  predniSONE (DELTASONE) 20 MG tablet, Take 2 each am x 3 days, 1 each am x 3 days, 1/2 each am x 4 days and stop, Disp: 11 tablet, Rfl: 0 .  ranitidine (ZANTAC) 300 MG tablet, Take 300 mg by mouth at bedtime. 1 tablet at bedtime, Disp: , Rfl:  .  rivaroxaban (XARELTO) 20 MG TABS tablet, Take 1 tablet (20 mg total) by mouth daily with supper. (Patient taking differently: Take 20 mg by mouth daily with breakfast. ), Disp: 30 tablet, Rfl: 0 .  sodium chloride (OCEAN) 0.65 % SOLN nasal spray, Place 2 sprays into both nostrils as needed for congestion., Disp: , Rfl:  .  verapamil (CALAN-SR) 240 MG CR tablet, TAKE 1 TABLET BY MOUTH EVERY DAY, Disp: 30 tablet, Rfl: 2  Allergies:  Allergies  Allergen Reactions  . Tramadol Shortness Of Breath  . Amlodipine     Other reaction(s): SWELLING  . Aspirin     Other reaction(s): PALPITATIONS Other reaction(s): DIFFICULTY BREATHING Heart flutter  . Codeine Other (See Comments)    Hallucinations, can take Tylenol #3 now  . Doxycycline     Other reaction(s): NAUSEA,VOMITING  . Hydrocodone     hallucinations  . Losartan     unknown  . Propoxyphene Nausea Only    Other reaction(s): NAUSEA  . Tiotropium Itching and Rash    Past Medical History, Surgical history, Social history, and Family History were reviewed and updated.  Review of Systems: As above  Physical Exam:  weight is 113 lb (51.256 kg). Her  oral temperature is 98.1 F (36.7 C). Her blood pressure is 145/74 and her pulse is 86. Her respiration is 18.   Thin but well-nourished African-American female. Head and neck exam shows no ocular or oral lesions. She has no palpable cervical or supraclavicular lymph nodes. Lungs are with marked decreased breath sounds bilaterally. She has no wheezes. Cardiac exam regular rate and rhythm with no murmurs, rubs or bruits. Abdomen is soft. She has good bowel sounds. There is no fluid wave. There is no palpable liver or spleen tip. Extremities shows no clubbing, cyanosis or edema. She has some slight tenderness on the inner aspect of her right thigh and the lower portion. No obvious venous cord is noted. She has good range of motion of her joints. Skin exam shows no rashes, ecchymoses or petechia. Neurological exam is nonfocal.  Lab Results  Component Value Date   WBC 5.8 12/29/2014   HGB 11.2* 12/29/2014   HCT 35.7 12/29/2014   MCV 71* 12/29/2014   PLT 351 12/29/2014     Chemistry      Component Value Date/Time   NA 141 12/28/2014 1307   K 4.4 12/28/2014 1307   CL 105 12/28/2014 1307   CO2 30 12/28/2014 1307   BUN 16 12/28/2014 1307   CREATININE 0.84 12/28/2014 1307      Component Value Date/Time   CALCIUM 9.7 12/28/2014 1307   ALKPHOS 71 11/22/2014 1118   AST 47* 11/22/2014 1118   ALT 26 11/22/2014 1118   BILITOT 0.4 11/22/2014 1118         Impression and Plan: Holly Page is 79 year old African-American female. She had a thrombo-embolic event in the right inner inferior thigh. This happened in September 2015. She apparently had a DVT back in the 80s. She thinks she was on blood thinner for one month.  I think we can get her off Xarelto now. The Doppler looks quite encouraging. She has some chronic thickening of the vein wall but no obvious thrombus.  She cannot take aspirin.  I told her to try to stay well hydrated.  I probably will get her back in about 3 months for  follow-up. I don't see that would have to do any further Dopplers on her unless she has symptoms.   I spent about 25-30 minutes with her today.Volanda Napoleon, MD 4/1/20165:40 PM

## 2014-12-30 LAB — D-DIMER, QUANTITATIVE: D-Dimer, Quant: 0.27 ug/mL-FEU (ref 0.00–0.48)

## 2015-01-01 ENCOUNTER — Telehealth: Payer: Self-pay | Admitting: Critical Care Medicine

## 2015-01-01 MED ORDER — PREDNISONE 10 MG PO TABS: ORAL_TABLET | ORAL | Status: DC

## 2015-01-01 NOTE — Telephone Encounter (Signed)
Spoke with pt.  States she's been using budesonide 0.25mg  bid and perforomist bid for approx 1 month.  She doesn't believe they are helping.  States she takes both meds in the morning but at approx 2 pm each day she starts "breathing heavy even with o2 on."  She uses albuterol hfa or albuterol neb with no relief.  Reports she has aching in left chest but this is unchanged.  No cough.  Offered pt appt -- pt unable to come into [place] office d/t transportation and requesting further recs.  Dr. Joya Gaskins, pls advise.  Thank you.

## 2015-01-01 NOTE — Telephone Encounter (Signed)
lmtcb for pt.  

## 2015-01-01 NOTE — Telephone Encounter (Signed)
Called spoke with patient, advised of PW's recommendations as stated below Pt voiced her understanding Rx sent to verified pharmacy Nothing further needed; will sign off.

## 2015-01-01 NOTE — Telephone Encounter (Signed)
Stay on nebulizer meds Start prednisone 10mg  Take 4 for four days 3 for four days 2 for four days 1 for four days

## 2015-01-08 ENCOUNTER — Other Ambulatory Visit: Payer: Medicare Other

## 2015-01-08 ENCOUNTER — Encounter: Payer: Self-pay | Admitting: Family

## 2015-01-08 DIAGNOSIS — D649 Anemia, unspecified: Secondary | ICD-10-CM

## 2015-01-08 LAB — FECAL OCCULT BLOOD, IMMUNOCHEMICAL: Fecal Occult Bld: NEGATIVE

## 2015-01-09 ENCOUNTER — Telehealth: Payer: Self-pay | Admitting: Critical Care Medicine

## 2015-01-09 NOTE — Telephone Encounter (Signed)
Spoke with pt, is needing a letter from [place] stating she needs curb service with her trash cans.   Letter needs to go to [place] of [employer].  [address] [place] [address]  Or fax to [phone] attn [employer] Customer Service Dept.    Dr. Joya Gaskins are you ok with re-writing this letter for pt?  It looks like a letter was written last July on this subject.  Thanks!

## 2015-01-10 ENCOUNTER — Telehealth: Payer: Self-pay | Admitting: Family

## 2015-01-10 NOTE — Telephone Encounter (Signed)
Questions answered.

## 2015-01-10 NOTE — Telephone Encounter (Signed)
i am ok with this letter

## 2015-01-10 NOTE — Telephone Encounter (Signed)
Caller name:Volkert, Timothea Relation to VE:[id number] Call back [place]:  Reason for call: pt would like for you to give her a call states she has questions regarding her lab results

## 2015-01-11 ENCOUNTER — Encounter: Payer: Self-pay | Admitting: *Deleted

## 2015-01-11 NOTE — Telephone Encounter (Signed)
Letter has been written. Pt is aware that I will fax this letter for her today. Nothing further was needed.

## 2015-01-25 ENCOUNTER — Encounter: Payer: Self-pay | Admitting: Critical Care Medicine

## 2015-01-25 ENCOUNTER — Other Ambulatory Visit: Payer: Self-pay | Admitting: *Deleted

## 2015-01-25 ENCOUNTER — Ambulatory Visit (INDEPENDENT_AMBULATORY_CARE_PROVIDER_SITE_OTHER): Payer: Medicare Other | Admitting: Family Medicine

## 2015-01-25 ENCOUNTER — Encounter: Payer: Self-pay | Admitting: Family Medicine

## 2015-01-25 ENCOUNTER — Telehealth: Payer: Self-pay | Admitting: Hematology & Oncology

## 2015-01-25 ENCOUNTER — Telehealth: Payer: Self-pay | Admitting: *Deleted

## 2015-01-25 ENCOUNTER — Encounter: Payer: Self-pay | Admitting: *Deleted

## 2015-01-25 ENCOUNTER — Ambulatory Visit: Payer: Medicare Other | Admitting: Family Medicine

## 2015-01-25 ENCOUNTER — Ambulatory Visit (HOSPITAL_BASED_OUTPATIENT_CLINIC_OR_DEPARTMENT_OTHER)
Admission: RE | Admit: 2015-01-25 | Discharge: 2015-01-25 | Disposition: A | Discharge status: Home / Self Care | Payer: Medicare Other | Source: Ambulatory Visit | Attending: Family Medicine | Admitting: Family Medicine

## 2015-01-25 ENCOUNTER — Other Ambulatory Visit: Payer: Medicare Other

## 2015-01-25 ENCOUNTER — Ambulatory Visit (INDEPENDENT_AMBULATORY_CARE_PROVIDER_SITE_OTHER): Payer: Medicare Other | Admitting: Critical Care Medicine

## 2015-01-25 VITALS — BP 140/70 | HR 70 | Temp 98.3°F | Wt 114.0 lb

## 2015-01-25 VITALS — BP 140/84 | HR 71 | Temp 97.8°F | Ht 65.0 in | Wt 114.0 lb

## 2015-01-25 DIAGNOSIS — R3589 Other polyuria: Secondary | ICD-10-CM

## 2015-01-25 DIAGNOSIS — I809 Phlebitis and thrombophlebitis of unspecified site: Secondary | ICD-10-CM | POA: Insufficient documentation

## 2015-01-25 DIAGNOSIS — R358 Other polyuria: Secondary | ICD-10-CM

## 2015-01-25 DIAGNOSIS — J432 Centrilobular emphysema: Secondary | ICD-10-CM

## 2015-01-25 DIAGNOSIS — I82890 Acute embolism and thrombosis of other specified veins: Secondary | ICD-10-CM | POA: Diagnosis not present

## 2015-01-25 DIAGNOSIS — M79602 Pain in left arm: Secondary | ICD-10-CM

## 2015-01-25 LAB — BASIC METABOLIC PANEL
BUN: 11 mg/dL (ref 6–23)
CO2: 28 mEq/L (ref 19–32)
Calcium: 9.5 mg/dL (ref 8.4–10.5)
Chloride: 101 mEq/L (ref 96–112)
Creat: 0.6 mg/dL (ref 0.50–1.10)
GLUCOSE: 79 mg/dL (ref 70–99)
Potassium: 3.8 mEq/L (ref 3.5–5.3)
Sodium: 139 mEq/L (ref 135–145)

## 2015-01-25 MED ORDER — ALBUTEROL SULFATE HFA 108 (90 BASE) MCG/ACT IN AERS
2.0000 | INHALATION_SPRAY | Freq: Four times a day (QID) | RESPIRATORY_TRACT | Status: DC | PRN
Start: 2015-01-25 — End: 2016-02-13

## 2015-01-25 MED ORDER — PREDNISONE 10 MG PO TABS: ORAL_TABLET | ORAL | Status: DC

## 2015-01-25 MED ORDER — UMECLIDINIUM BROMIDE 62.5 MCG/INH IN AEPB: 1.0000 | INHALATION_SPRAY | Freq: Every day | RESPIRATORY_TRACT | Status: DC

## 2015-01-25 NOTE — Progress Notes (Signed)
Patient ID: Holly Page, female    DOB: 08-Aug-1936  Age: 79 y.o. MRN: [id number]    Subjective:  Subjective HPI ROBERTTA HALFHILL presents for pt in L arm.  She has a hx of dvt and was on xaralto-- it was stopped April 1.  Pain in arm started 2 weeks ago and has progressively worsened.    Review of Systems  Constitutional: Negative for diaphoresis, activity change, appetite change, fatigue and unexpected weight change.  Eyes: Negative for pain, redness and visual disturbance.  Respiratory: Negative for cough, chest tightness, shortness of breath and wheezing.   Cardiovascular: Negative for chest pain, palpitations and leg swelling.  Endocrine: Negative for cold intolerance, heat intolerance, polydipsia, polyphagia and polyuria.  Genitourinary: Negative for dysuria, frequency and difficulty urinating.  Musculoskeletal: Positive for joint swelling.       Pain in L arm with knot.  She has swelling and errythema earlier in the week  Neurological: Negative for dizziness, light-headedness, numbness and headaches.  Psychiatric/Behavioral: Negative for behavioral problems and dysphoric mood. The patient is not nervous/anxious.     History Past Medical History  Diagnosis Date  . Pneumonia   . COPD (chronic obstructive pulmonary disease)   . Hypertension   . DVT (deep venous thrombosis) 1980s, recurrent 2015    Right DVT 2015  on xarelto  . GERD (gastroesophageal reflux disease)   . Lactose intolerance   . Spinal stenosis   . Meningioma     followed by Dr Christella Noa    She has past surgical history that includes Foot surgery; Cholecystectomy; Tonsillectomy; and Tubal ligation.   Her family history includes COPD in her father; Heart attack in her son; Lupus in her sister.She reports that she quit smoking about 2 years ago. Her smoking use included Cigarettes. She has a 84 pack-year smoking history. She has never used smokeless tobacco. She reports that she does not drink alcohol or use  illicit drugs.  Current Outpatient Prescriptions on File Prior to Visit  Medication Sig Dispense Refill  . acetaminophen (TYLENOL) 325 MG tablet Take 2 tablets (650 mg total) by mouth every 6 (six) hours as needed. (Patient taking differently: Take 650 mg by mouth every 6 (six) hours as needed for mild pain (back pain). ) 15 tablet 0  . albuterol (PROVENTIL HFA;VENTOLIN HFA) 108 (90 BASE) MCG/ACT inhaler Inhale 2 puffs into the lungs every 6 (six) hours as needed for wheezing or shortness of breath. 1 Inhaler 3  . albuterol (PROVENTIL) (2.5 MG/3ML) 0.083% nebulizer solution Take 2.5 mg by nebulization every 6 (six) hours as needed for shortness of breath. And as needed    . budesonide (PULMICORT) 0.25 MG/2ML nebulizer solution Take 2 mLs (0.25 mg total) by nebulization 2 (two) times daily. DX: J43.9 120 mL 12  . Cholecalciferol (VITAMIN D-3) 5000 UNITS TABS Take 1 tablet by mouth daily.    . formoterol (PERFOROMIST) 20 MCG/2ML nebulizer solution Take 2 mLs (20 mcg total) by nebulization 2 (two) times daily. DX: J43.9 120 mL 12  . guaiFENesin (ROBITUSSIN) 100 MG/5ML SOLN Take 5 mLs (100 mg total) by mouth every 4 (four) hours as needed for cough or to loosen phlegm. 473 mL 0  . hydrochlorothiazide (MICROZIDE) 12.5 MG capsule Take 12.5 mg by mouth daily. Pt takes according to BP reading.    . Multiple Vitamins-Minerals (MULTIVITAMIN & MINERAL PO) Take 1 tablet by mouth daily.    . potassium chloride SA (K-DUR,KLOR-CON) 20 MEQ tablet Take 1 tablet (20 mEq  total) by mouth daily. 30 tablet 3  . predniSONE (DELTASONE) 10 MG tablet Take two daily x 7days then one daily and stay 60 tablet 6  . ranitidine (ZANTAC) 300 MG tablet Take 300 mg by mouth at bedtime. 1 tablet at bedtime    . sodium chloride (OCEAN) 0.65 % SOLN nasal spray Place 2 sprays into both nostrils as needed for congestion.    [name] Umeclidinium Bromide (INCRUSE ELLIPTA) 62.5 MCG/INH AEPB Inhale 1 puff into the lungs daily. 30 each 6  .  verapamil (CALAN-SR) 240 MG CR tablet TAKE 1 TABLET BY MOUTH EVERY DAY 30 tablet 2   No current facility-administered medications on file prior to visit.     Objective:  Objective Physical Exam  Constitutional: She is oriented to person, place, and time. She appears well-developed and well-nourished. No distress.  HENT:  Right Ear: External ear normal.  Left Ear: External ear normal.  Nose: Nose normal.  Mouth/Throat: Oropharynx is clear and moist.  Eyes: EOM are normal. Pupils are equal, round, and reactive to light.  Neck: Normal range of motion. Neck supple.  Cardiovascular: Normal rate, regular rhythm and normal heart sounds.   No murmur heard. Pulmonary/Chest: Effort normal and breath sounds normal. No respiratory distress. She has no wheezes. She has no rales. She exhibits no tenderness.  Musculoskeletal: She exhibits tenderness.  Pain in low left arm--+ nodule palpated No errythema or swelling today  Neurological: She is alert and oriented to person, place, and time.  Psychiatric: She has a normal mood and affect. Her behavior is normal. Judgment and thought content normal.   BP 140/70 mmHg  Pulse 70  Temp(Src) 98.3 F (36.8 C) (Oral)  Wt 114 lb (51.71 kg)  SpO2 94% Wt Readings from Last 3 Encounters:  01/25/15 114 lb (51.71 kg)  01/25/15 114 lb (51.71 kg)  12/29/14 113 lb (51.256 kg)     Lab Results  Component Value Date   WBC 5.8 12/29/2014   HGB 11.2* 12/29/2014   HCT 35.7 12/29/2014   PLT 351 12/29/2014   GLUCOSE 95 12/28/2014   ALT 26 11/22/2014   AST 47* 11/22/2014   NA 141 12/28/2014   K 4.4 12/28/2014   CL 105 12/28/2014   CREATININE 0.84 12/28/2014   BUN 16 12/28/2014   CO2 30 12/28/2014   TSH 1.350 02/02/2014    US Venous Img Lower Unilateral Right  12/29/2014   CLINICAL DATA:  79 year old female with a history of prior DVT.  Prior study 09/19/2014 demonstrates no acute DVT with questionable chronic right femoral vein DVT.  EXAM: RIGHT LOWER  EXTREMITY VENOUS DOPPLER ULTRASOUND  TECHNIQUE: Gray-scale sonography with graded compression, as well as color Doppler and duplex ultrasound were performed to evaluate the lower extremity deep venous systems from the level of the common femoral vein and including the common femoral, femoral, profunda femoral, popliteal and calf veins including the posterior tibial, peroneal and gastrocnemius veins when visible. The superficial great saphenous vein was also interrogated. Spectral Doppler was utilized to evaluate flow at rest and with distal augmentation maneuvers in the common femoral, femoral and popliteal veins.  COMPARISON:  09/19/2014  FINDINGS: Contralateral Common Femoral Vein: Respiratory phasicity is normal and symmetric with the symptomatic side. No evidence of thrombus. Normal compressibility.  Common Femoral Vein: No evidence of thrombus. Normal compressibility, respiratory phasicity and response to augmentation.  Saphenofemoral Junction: No evidence of thrombus. Normal compressibility and flow on color Doppler imaging.  Profunda Femoral Vein: No evidence of  thrombus. Normal compressibility and flow on color Doppler imaging.  Femoral Vein: No evidence of acute occlusive thrombus. Chronic wall thickening. Flow maintained.  Popliteal Vein: No evidence of thrombus. Normal compressibility, respiratory phasicity and response to augmentation.  Calf Veins: No evidence of thrombus. Normal compressibility and flow on color Doppler imaging.  Superficial Great Saphenous Vein: No evidence of thrombus. Normal compressibility and flow on color Doppler imaging.  Other Findings:  None.  IMPRESSION: Sonographic survey of the right lower extremity demonstrates no evidence of acute DVT.  Changes of chronic / recanalized DVT of the right femoral vein.  Signed,  Dulcy Fanny. Earleen Newport, DO  [facility]  [facility]   Electronically Signed   By: Corrie Mckusick D.O.   On: 12/29/2014 10:34       Assessment & Plan:  Plan I am having Ms. Scoggin maintain her albuterol, acetaminophen, ranitidine, hydrochlorothiazide, Vitamin D-3, verapamil, sodium chloride, guaiFENesin, formoterol, budesonide, potassium chloride SA, Multiple Vitamins-Minerals (MULTIVITAMIN & MINERAL PO), Umeclidinium Bromide, predniSONE, and albuterol.  No orders of the defined types were placed in this encounter.    Problem List Items Addressed This Visit    Superficial thrombophlebitis    D/w with hematology xaralto 15 mg bid x 21 days then 20 mg qd  F/u hematology       Other Visit Diagnoses    Left arm pain    -  Primary    Relevant Orders    US Venous Img Upper Uni Left (Completed)       Follow-up: Return if symptoms worsen or fail to improve.  Garnet Koyanagi, DO

## 2015-01-25 NOTE — Telephone Encounter (Signed)
Received call from imaging downstairs with call report for US venous upper left extremity. Dr. Etter Sjogren took call to receive results. She called Dr. Marin Olp. They agreed that pt start Xarelto 15 mg for 21 days and then take Xarelto 20 mg thereafter. Pt is to make a follow up appt with Dr. Marin Olp in 3-4 weeks. Per Dr. Etter Sjogren, samples of Xarelto 15mg  (3 bottles of 14 tabs) were placed up front for pt to pick up.   Called and informed pt. She was confused as to why she needed to be on Xarelto again since it was d/c April 1st. Call was transferred to [place], Therapist, sports.

## 2015-01-25 NOTE — Progress Notes (Signed)
Pre visit review using our clinic review tool, if applicable. No additional management support is needed unless otherwise documented below in the visit note. 

## 2015-01-25 NOTE — Telephone Encounter (Signed)
Pt aware of 5-2 appointment transferred call to RN for pt question

## 2015-01-25 NOTE — Telephone Encounter (Signed)
Patient was concerned about taking 15 bid for 21 days, she wanted me to ask Dr.Lowne again and I did, Dr.Lowne advised 15 mg bid and the patient stated she did not take it this way the last time and she wanted me to be sure this was ok with Dr.Ennever. Dr.Ennever is ok with this and the patient has been made aware.     KP

## 2015-01-25 NOTE — Assessment & Plan Note (Signed)
Gold stage D COPD with progressive decline and intolerance of some anticholinergic medications Lack of response to Perforomist Plan Start incruse one puff daily Labs today bmet  Prednisone 10mg  two a day for 7days then one daily and stay Refills on proair No other changes Continue Perforomist and budesonide as prescribed by nebulizer You will be enrolled in gold copd program Return June 2 with tammy parrett here at [facility]

## 2015-01-25 NOTE — Progress Notes (Signed)
Subjective:    Patient ID: Holly Page, female    DOB: [birth year] .   MRN: [id number]  HPI 79 y.o. F with Gold D Copd primary emphysema  01/25/2015 Chief Complaint  Patient presents with  . 6 wk follow up    Breathing not much better - SOB returns approx 1-2 hours after using Perforomist and Budesonide.  Not receiving as much relief from albuterol hfa as in the past since starting on nebs.  Aching sensation in chest at times.  No cough.  Not doing well, even after take perforomist and still dyspneic.  No cough, no mucus, no real chest pain. Pt notes some aching.  On meds for GERD.  No sore throat.  Voice is changing  Pt denies any significant sore throat, nasal congestion or excess secretions, fever, chills, sweats, unintended weight loss, pleurtic or exertional chest pain, orthopnea PND, or leg swelling Pt denies any increase in rescue therapy over baseline, denies waking up needing it or having any early am or nocturnal exacerbations of coughing/wheezing/or dyspnea. Pt also denies any obvious fluctuation in symptoms with  weather or environmental change or other alleviating or aggravating factors    Current Medications, Allergies, Complete Past Medical History, Past Surgical History, Family History, and Social History were reviewed in [employer] record.  ROS  The following are not active complaints unless bolded sore throat, dysphagia, dental problems, itching, sneezing,  nasal congestion or excess/ purulent secretions, ear ache,   fever, chills, sweats, unintended wt loss, pleuritic or exertional cp, hemoptysis,  orthopnea pnd or leg swelling, presyncope, palpitations, heartburn, abdominal pain, anorexia, nausea, vomiting, diarrhea  or change in bowel or urinary habits, change in stools or urine, dysuria,hematuria,  rash, arthralgias, visual complaints, headache, numbness weakness or ataxia or problems with walking or coordination,  change in mood/affect or  memory.          Objective:   Physical Exam  BP 140/84 mmHg  Pulse 71  Temp(Src) 97.8 F (36.6 C) (Oral)  Ht 5\' 5"  (1.651 m)  Wt 114 lb (51.71 kg)  BMI 18.97 kg/m2  SpO2 97%   GEN: A/Ox3; pleasant , NAD, well nourished / easily confused with details of care   Wt Readings from Last 3 Encounters:  01/25/15 114 lb (51.71 kg)  12/29/14 113 lb (51.256 kg)  12/19/14 110 lb (49.896 kg)    Vital signs reviewed   HEENT:  [place]/AT,  EACs-clear, TMs-wnl, NOSE-clear, THROAT-clear, no lesions, nasal inflammation with mild purulence  NECK:  Supple w/ fair ROM; no JVD; normal carotid impulses w/o bruits; no thyromegaly or nodules palpated; no lymphadenopathy.  RESP  Diminished in bases  Trace exp wheeze bilaterally .no accessory muscle use, no dullness to percussion  CARD:  RRR, no m/r/g  , no peripheral edema, pulses intact, no cyanosis or clubbing.  GI:   Soft & nt; nml bowel sounds; no organomegaly or masses detected.  Musco: Warm bil, no deformities or joint swelling noted.   Neuro: alert, no focal deficits noted.    Skin: Warm, no lesions or rashes         Assessment & Plan:   COPD with emphysema Gold D Gold stage D COPD with progressive decline and intolerance of some anticholinergic medications Lack of response to Perforomist Plan Start incruse one puff daily Labs today bmet  Prednisone 10mg  two a day for 7days then one daily and stay Refills on proair No other changes Continue Perforomist and budesonide as prescribed  by nebulizer You will be enrolled in gold copd program Return June 2 with tammy parrett here at [facility]     Updated Medication List Outpatient Encounter Prescriptions as of 01/25/2015  Medication Sig  . acetaminophen (TYLENOL) 325 MG tablet Take 2 tablets (650 mg total) by mouth every 6 (six) hours as needed. (Patient taking differently: Take 650 mg by mouth every 6 (six) hours as needed for mild pain (back pain). )  . albuterol (PROVENTIL HFA;VENTOLIN  HFA) 108 (90 BASE) MCG/ACT inhaler Inhale 2 puffs into the lungs every 6 (six) hours as needed for wheezing or shortness of breath.  [name] albuterol (PROVENTIL) (2.5 MG/3ML) 0.083% nebulizer solution Take 2.5 mg by nebulization every 6 (six) hours as needed for shortness of breath. And as needed  . budesonide (PULMICORT) 0.25 MG/2ML nebulizer solution Take 2 mLs (0.25 mg total) by nebulization 2 (two) times daily. DX: J43.9  . Cholecalciferol (VITAMIN D-3) 5000 UNITS TABS Take 1 tablet by mouth daily.  . formoterol (PERFOROMIST) 20 MCG/2ML nebulizer solution Take 2 mLs (20 mcg total) by nebulization 2 (two) times daily. DX: J43.9  . guaiFENesin (ROBITUSSIN) 100 MG/5ML SOLN Take 5 mLs (100 mg total) by mouth every 4 (four) hours as needed for cough or to loosen phlegm.  . hydrochlorothiazide (MICROZIDE) 12.5 MG capsule Take 12.5 mg by mouth daily. Pt takes according to BP reading.  . Multiple Vitamins-Minerals (MULTIVITAMIN & MINERAL PO) Take 1 tablet by mouth daily.  . potassium chloride SA (K-DUR,KLOR-CON) 20 MEQ tablet Take 1 tablet (20 mEq total) by mouth daily.  . ranitidine (ZANTAC) 300 MG tablet Take 300 mg by mouth at bedtime. 1 tablet at bedtime  . sodium chloride (OCEAN) 0.65 % SOLN nasal spray Place 2 sprays into both nostrils as needed for congestion.  . verapamil (CALAN-SR) 240 MG CR tablet TAKE 1 TABLET BY MOUTH EVERY DAY  . [DISCONTINUED] albuterol (PROVENTIL HFA;VENTOLIN HFA) 108 (90 BASE) MCG/ACT inhaler Inhale 2 puffs into the lungs every 6 (six) hours as needed for wheezing or shortness of breath.  . predniSONE (DELTASONE) 10 MG tablet Take two daily x 7days then one daily and stay  . Umeclidinium Bromide (INCRUSE ELLIPTA) 62.5 MCG/INH AEPB Inhale 1 puff into the lungs daily.  . [DISCONTINUED] docusate sodium (COLACE) 100 MG capsule Take 100 mg by mouth 2 (two) times daily as needed.  . [DISCONTINUED] predniSONE (DELTASONE) 10 MG tablet Take 4 tabs by mouth in the morning x4 days, 3  tabs x4 days, 2 tabs x4 days, 1 tab x4 days and stop. (Patient not taking: Reported on 01/25/2015)  . [DISCONTINUED] rivaroxaban (XARELTO) 20 MG TABS tablet Take 1 tablet (20 mg total) by mouth daily with supper. (Patient not taking: Reported on 01/25/2015)

## 2015-01-25 NOTE — Patient Instructions (Signed)
Start incruse one puff daily Labs today  Prednisone 10mg  two a day for 7days then one daily and stay Refills on proair No other changes You will be enrolled in gold copd program Return June 2 with tammy parrett here at [facility]

## 2015-01-25 NOTE — Patient Instructions (Signed)

## 2015-01-25 NOTE — Assessment & Plan Note (Signed)
D/w with hematology xaralto 15 mg bid x 21 days then 20 mg qd  F/u hematology

## 2015-01-26 NOTE — Progress Notes (Signed)
Quick Note:  Called, spoke with pt. Discussed lab results and recs per Dr. Joya Gaskins. She verbalized understanding and voiced no further questions or concerns at this time. ______

## 2015-01-26 NOTE — Progress Notes (Signed)
Quick Note:  Call pt and tell her labs are ok, No change in medications ______ 

## 2015-01-26 NOTE — Telephone Encounter (Signed)
Spoke with patient on 01/25/2015: reviewed need for taking xarelto. Patient verbalizes understanding and will come by office to pick up medications.Check log for Lot#.

## 2015-01-29 ENCOUNTER — Encounter: Payer: Self-pay | Admitting: Family

## 2015-01-29 ENCOUNTER — Other Ambulatory Visit (HOSPITAL_BASED_OUTPATIENT_CLINIC_OR_DEPARTMENT_OTHER): Payer: Medicare Other

## 2015-01-29 ENCOUNTER — Ambulatory Visit (HOSPITAL_BASED_OUTPATIENT_CLINIC_OR_DEPARTMENT_OTHER): Payer: Medicare Other | Admitting: Family

## 2015-01-29 VITALS — BP 154/78 | HR 70 | Temp 97.9°F | Resp 16 | Ht 65.0 in | Wt 115.0 lb

## 2015-01-29 DIAGNOSIS — I809 Phlebitis and thrombophlebitis of unspecified site: Secondary | ICD-10-CM

## 2015-01-29 DIAGNOSIS — I82401 Acute embolism and thrombosis of unspecified deep veins of right lower extremity: Secondary | ICD-10-CM

## 2015-01-29 DIAGNOSIS — I82612 Acute embolism and thrombosis of superficial veins of left upper extremity: Secondary | ICD-10-CM | POA: Diagnosis not present

## 2015-01-29 DIAGNOSIS — Z86718 Personal history of other venous thrombosis and embolism: Secondary | ICD-10-CM | POA: Diagnosis not present

## 2015-01-29 LAB — CBC WITH DIFFERENTIAL (CANCER CENTER ONLY)
BASO#: 0 10*3/uL (ref 0.0–0.2)
BASO%: 0.4 % (ref 0.0–2.0)
EOS%: 2.5 % (ref 0.0–7.0)
Eosinophils Absolute: 0.1 10*3/uL (ref 0.0–0.5)
HEMATOCRIT: 36.5 % (ref 34.8–46.6)
HGB: 11.8 g/dL (ref 11.6–15.9)
LYMPH#: 2 10*3/uL (ref 0.9–3.3)
LYMPH%: 39.4 % (ref 14.0–48.0)
MCH: 22.9 pg — ABNORMAL LOW (ref 26.0–34.0)
MCHC: 32.3 g/dL (ref 32.0–36.0)
MCV: 71 fL — AB (ref 81–101)
MONO#: 0.5 10*3/uL (ref 0.1–0.9)
MONO%: 9.8 % (ref 0.0–13.0)
NEUT#: 2.4 10*3/uL (ref 1.5–6.5)
NEUT%: 47.9 % (ref 39.6–80.0)
PLATELETS: 282 10*3/uL (ref 145–400)
RBC: 5.15 10*6/uL (ref 3.70–5.32)
RDW: 17.4 % — ABNORMAL HIGH (ref 11.1–15.7)
WBC: 5.1 10*3/uL (ref 3.9–10.0)

## 2015-01-29 LAB — D-DIMER, QUANTITATIVE (NOT AT ARMC): D DIMER QUANT: 0.37 ug{FEU}/mL (ref 0.00–0.48)

## 2015-01-29 MED ORDER — RIVAROXABAN 20 MG PO TABS: 20.0000 mg | ORAL_TABLET | Freq: Every day | ORAL | Status: DC

## 2015-01-29 NOTE — Progress Notes (Signed)
Hematology and Oncology Follow Up Visit  Holly Page [id number] [birth year] 79 y.o. 01/29/2015   Principle Diagnosis:  Thromboembolic disease of the right thigh  Current Therapy:   Xarelto 20 mg by mouth completed 6 months at the end of March 2016    Interim History:  Holly Page is here today for follow-up. She had an [place] of her left arm last week after having some swelling and pain. She has a superficial venous thrombosis in the left anterior superior forearm and has started Xarelto 15 mg BID.  She finished a 6 months on Xarelto in March for a right lower extremity DVT. Her [place] on 4/1 showed no evidence of acute DVT but showed changes of chronic / recanalized DVT of the right femoral vein. She has some discomfort in the leg at times when she is more active. I discussed postphlebitic syndrome with her. I ordered her 2 thigh high stockings to help with this.  She denies fever, chills, n/v, cough, rash, dizziness, chest pain, palpitations, abdomina pain, constipation, diarrhea, blood in urine or stool. She is SOB with exertion due to COPD. She is on supplemental O2 24 hours a day now.  No numbness or tingling in her extremities. No new aches or pains.  Her appetite is good and she is staying well hydrated. Her weight is stable.   Medications:    Medication List       This list is accurate as of: 01/29/15 10:10 AM.  Always use your most recent med list.               acetaminophen 325 MG tablet  Commonly known as:  TYLENOL  Take 2 tablets (650 mg total) by mouth every 6 (six) hours as needed.     albuterol (2.5 MG/3ML) 0.083% nebulizer solution  Commonly known as:  PROVENTIL  Take 2.5 mg by nebulization every 6 (six) hours as needed for shortness of breath. And as needed     albuterol 108 (90 BASE) MCG/ACT inhaler  Commonly known as:  PROVENTIL HFA;VENTOLIN HFA  Inhale 2 puffs into the lungs every 6 (six) hours as needed for wheezing or shortness of breath.     budesonide  0.25 MG/2ML nebulizer solution  Commonly known as:  PULMICORT  Take 2 mLs (0.25 mg total) by nebulization 2 (two) times daily. DX: J43.9     formoterol 20 MCG/2ML nebulizer solution  Commonly known as:  PERFOROMIST  Take 2 mLs (20 mcg total) by nebulization 2 (two) times daily. DX: J43.9     guaiFENesin 100 MG/5ML Soln  Commonly known as:  ROBITUSSIN  Take 5 mLs (100 mg total) by mouth every 4 (four) hours as needed for cough or to loosen phlegm.     MICROZIDE 12.5 MG capsule  Generic drug:  hydrochlorothiazide  Take 12.5 mg by mouth daily. Pt takes according to BP reading.     MULTIVITAMIN & MINERAL PO  Take 1 tablet by mouth daily.     potassium chloride SA 20 MEQ tablet  Commonly known as:  K-DUR,KLOR-CON  Take 1 tablet (20 mEq total) by mouth daily.     predniSONE 10 MG tablet  Commonly known as:  DELTASONE  Take two daily x 7days then one daily and stay     ranitidine 300 MG tablet  Commonly known as:  ZANTAC  Take 300 mg by mouth at bedtime. 1 tablet at bedtime     sodium chloride 0.65 % Soln nasal spray  Commonly known  as:  OCEAN  Place 2 sprays into both nostrils as needed for congestion.     Umeclidinium Bromide 62.5 MCG/INH Aepb  Commonly known as:  INCRUSE ELLIPTA  Inhale 1 puff into the lungs daily.     verapamil 240 MG CR tablet  Commonly known as:  CALAN-SR  TAKE 1 TABLET BY MOUTH EVERY DAY     Vitamin D-3 5000 UNITS Tabs  Take 1 tablet by mouth daily.        Allergies:  Allergies  Allergen Reactions  . Tramadol Shortness Of Breath  . Amlodipine     Other reaction(s): SWELLING  . Aspirin     Other reaction(s): PALPITATIONS Other reaction(s): DIFFICULTY BREATHING Heart flutter  . Codeine Other (See Comments)    Hallucinations, can take Tylenol #3 now  . Doxycycline     Other reaction(s): NAUSEA,VOMITING  . Hydrocodone     hallucinations  . Losartan     unknown  . Propoxyphene Nausea Only    Other reaction(s): NAUSEA  . Tiotropium  Itching and Rash    Past Medical History, Surgical history, Social history, and Family History were reviewed and updated.  Review of Systems: All other 10 point review of systems is negative.   Physical Exam:  vitals were not taken for this visit.  Wt Readings from Last 3 Encounters:  01/25/15 114 lb (51.71 kg)  01/25/15 114 lb (51.71 kg)  12/29/14 113 lb (51.256 kg)    Ocular: Sclerae unicteric, pupils equal, round and reactive to light Ear-nose-throat: Oropharynx clear, dentition fair Lymphatic: No cervical or supraclavicular adenopathy Lungs no rales or rhonchi, good excursion bilaterally Heart regular rate and rhythm, no murmur appreciated Abd soft, nontender, positive bowel sounds MSK no focal spinal tenderness, no joint edema Neuro: non-focal, well-oriented, appropriate affect Breasts: Deferred  Lab Results  Component Value Date   WBC 5.1 01/29/2015   HGB 11.8 01/29/2015   HCT 36.5 01/29/2015   MCV 71* 01/29/2015   PLT 282 01/29/2015   Lab Results  Component Value Date   FERRITIN 209.1 12/19/2014   IRON 66 12/21/2014   TIBC 342 12/21/2014   UIBC 276 12/21/2014   IRONPCTSAT 19* 12/21/2014   Lab Results  Component Value Date   RBC 5.15 01/29/2015   No results found for: KPAFRELGTCHN, LAMBDASER, KAPLAMBRATIO No results found for: IGGSERUM, IGA, IGMSERUM No results found for: Ronnald Ramp, A1GS, A2GS, Tillman Sers, SPEI   Chemistry      Component Value Date/Time   NA 139 01/25/2015 1010   K 3.8 01/25/2015 1010   CL 101 01/25/2015 1010   CO2 28 01/25/2015 1010   BUN 11 01/25/2015 1010   CREATININE 0.60 01/25/2015 1010   CREATININE 0.84 12/28/2014 1307      Component Value Date/Time   CALCIUM 9.5 01/25/2015 1010   ALKPHOS 71 11/22/2014 1118   AST 47* 11/22/2014 1118   ALT 26 11/22/2014 1118   BILITOT 0.4 11/22/2014 1118     Impression and Plan: Holly Page is 79 year old African-American female. She now has a  superficial blood clot in her left arm. She has had some swelling and discomfort and is now on Xarelto 15 mg BID for 21 days and the 20 mg daily for 9 more weeks. We will treat her for a total of 3 months.  She finished 6 months of anticoagulation in March for a DVT in her right leg.  She is staying well hydrated with water.  She has had some issues getting  her insurance to approve Xarelto. She has samples to get her through the first 3 weeks. JoEllen is working on getting her pre-approved.  We will see her back in 6 weeks for labs and follow-up and get an ultrasound of her arm that same day.  She knows to call here with any questions or concerns and to got to the ED in the event of an emergency. We can certainly see her sooner if need be.   Eliezer Bottom, NP 5/2/201610:10 AM

## 2015-02-05 ENCOUNTER — Encounter: Payer: Self-pay | Admitting: Nurse Practitioner

## 2015-02-05 ENCOUNTER — Other Ambulatory Visit: Payer: Self-pay | Admitting: Neurosurgery

## 2015-02-05 ENCOUNTER — Telehealth: Payer: Self-pay | Admitting: Nurse Practitioner

## 2015-02-05 DIAGNOSIS — D320 Benign neoplasm of cerebral meninges: Secondary | ICD-10-CM

## 2015-02-05 NOTE — Telephone Encounter (Signed)
Received notification from [employer] that [place] is approved 02/02/15 to 02/02/16. PA# [id number]

## 2015-02-05 NOTE — Progress Notes (Unsigned)
No charting

## 2015-02-06 ENCOUNTER — Telehealth: Payer: Self-pay | Admitting: Hematology & Oncology

## 2015-02-06 NOTE — Telephone Encounter (Signed)
[employer] has APPROVED the XARELTO TAB 20MG   and is good until 02/02/2016.         COPY SCANNED

## 2015-02-08 ENCOUNTER — Ambulatory Visit (INDEPENDENT_AMBULATORY_CARE_PROVIDER_SITE_OTHER): Payer: Medicare Other | Admitting: Family Medicine

## 2015-02-08 ENCOUNTER — Encounter: Payer: Self-pay | Admitting: Family Medicine

## 2015-02-08 VITALS — BP 140/80 | HR 77 | Temp 98.1°F | Wt 110.6 lb

## 2015-02-08 DIAGNOSIS — M549 Dorsalgia, unspecified: Secondary | ICD-10-CM | POA: Diagnosis not present

## 2015-02-08 MED ORDER — TIZANIDINE HCL 4 MG PO TABS: 4.0000 mg | ORAL_TABLET | Freq: Four times a day (QID) | ORAL | Status: DC | PRN

## 2015-02-08 MED ORDER — ACETAMINOPHEN-CODEINE #3 300-30 MG PO TABS: 1.0000 | ORAL_TABLET | Freq: Three times a day (TID) | ORAL | Status: DC | PRN

## 2015-02-08 NOTE — Patient Instructions (Signed)
Back Pain, Adult Low back pain is very common. About 1 in 5 people have back pain.The cause of low back pain is rarely dangerous. The pain often gets better over time.About half of people with a sudden onset of back pain feel better in just 2 weeks. About 8 in 10 people feel better by 6 weeks.  CAUSES Some common causes of back pain include:  Strain of the muscles or ligaments supporting the spine.  Wear and tear (degeneration) of the spinal discs.  Arthritis.  Direct injury to the back. DIAGNOSIS Most of the time, the direct cause of low back pain is not known.However, back pain can be treated effectively even when the exact cause of the pain is unknown.Answering your caregiver's questions about your overall health and symptoms is one of the most accurate ways to make sure the cause of your pain is not dangerous. If your caregiver needs more information, he or she may order lab work or imaging tests (X-rays or MRIs).However, even if imaging tests show changes in your back, this usually does not require surgery. HOME CARE INSTRUCTIONS For many people, back pain returns.Since low back pain is rarely dangerous, it is often a condition that people can learn to manageon their own.   Remain active. It is stressful on the back to sit or stand in one place. Do not sit, drive, or stand in one place for more than 30 minutes at a time. Take short walks on level surfaces as soon as pain allows.Try to increase the length of time you walk each day.  Do not stay in bed.Resting more than 1 or 2 days can delay your recovery.  Do not avoid exercise or work.Your body is made to move.It is not dangerous to be active, even though your back may hurt.Your back will likely heal faster if you return to being active before your pain is gone.  Pay attention to your body when you bend and lift. Many people have less discomfortwhen lifting if they bend their knees, keep the load close to their bodies,and  avoid twisting. Often, the most comfortable positions are those that put less stress on your recovering back.  Find a comfortable position to sleep. Use a firm mattress and lie on your side with your knees slightly bent. If you lie on your back, put a pillow under your knees.  Only take over-the-counter or prescription medicines as directed by your caregiver. Over-the-counter medicines to reduce pain and inflammation are often the most helpful.Your caregiver may prescribe muscle relaxant drugs.These medicines help dull your pain so you can more quickly return to your normal activities and healthy exercise.  Put ice on the injured area.  Put ice in a plastic bag.  Place a towel between your skin and the bag.  Leave the ice on for 15-20 minutes, 03-04 times a day for the first 2 to 3 days. After that, ice and heat may be alternated to reduce pain and spasms.  Ask your caregiver about trying back exercises and gentle massage. This may be of some benefit.  Avoid feeling anxious or stressed.Stress increases muscle tension and can worsen back pain.It is important to recognize when you are anxious or stressed and learn ways to manage it.Exercise is a great option. SEEK MEDICAL CARE IF:  You have pain that is not relieved with rest or medicine.  You have pain that does not improve in 1 week.  You have new symptoms.  You are generally not feeling well. SEEK   IMMEDIATE MEDICAL CARE IF:   You have pain that radiates from your back into your legs.  You develop new bowel or bladder control problems.  You have unusual weakness or numbness in your arms or legs.  You develop nausea or vomiting.  You develop abdominal pain.  You feel faint. Document Released: 09/15/2005 Document Revised: 03/16/2012 Document Reviewed: 01/17/2014 Exit[employer] Patient Information 2015 [employer]. This information is not intended to replace advice given to you by your health care provider. Make sure you  discuss any questions you have with your health care provider.  

## 2015-02-08 NOTE — Progress Notes (Signed)
Pre visit review using our clinic review tool, if applicable. No additional management support is needed unless otherwise documented below in the visit note. 

## 2015-02-08 NOTE — Progress Notes (Signed)
Patient ID: Holly Page, female    DOB: [birth year]  Age: 79 y.o. MRN: [id number]    Subjective:  Subjective HPI Holly Page presents for back pain.  No complaints.    Review of Systems  Constitutional: Negative for activity change, appetite change and unexpected weight change.  Respiratory: Negative for cough and shortness of breath.   Cardiovascular: Negative for chest pain and palpitations.  Musculoskeletal: Positive for back pain. Negative for gait problem.  Psychiatric/Behavioral: Negative for behavioral problems and dysphoric mood. The patient is not nervous/anxious.     History Past Medical History  Diagnosis Date  . Pneumonia   . COPD (chronic obstructive pulmonary disease)   . Hypertension   . DVT (deep venous thrombosis) 1980s, recurrent 2015    Right DVT 2015  on xarelto  . GERD (gastroesophageal reflux disease)   . Lactose intolerance   . Spinal stenosis   . Meningioma     followed by Dr Christella Noa    She has past surgical history that includes Foot surgery; Cholecystectomy; Tonsillectomy; and Tubal ligation.   Her family history includes COPD in her father; Heart attack in her son; Lupus in her sister.She reports that she quit smoking about 2 years ago. Her smoking use included Cigarettes. She has a 84 pack-year smoking history. She has never used smokeless tobacco. She reports that she does not drink alcohol or use illicit drugs.  Current Outpatient Prescriptions on File Prior to Visit  Medication Sig Dispense Refill  . acetaminophen (TYLENOL) 325 MG tablet Take 2 tablets (650 mg total) by mouth every 6 (six) hours as needed. (Patient taking differently: Take 650 mg by mouth every 6 (six) hours as needed for mild pain (back pain). ) 15 tablet 0  . albuterol (PROVENTIL HFA;VENTOLIN HFA) 108 (90 BASE) MCG/ACT inhaler Inhale 2 puffs into the lungs every 6 (six) hours as needed for wheezing or shortness of breath. 1 Inhaler 3  . albuterol (PROVENTIL) (2.5  MG/3ML) 0.083% nebulizer solution Take 2.5 mg by nebulization every 6 (six) hours as needed for shortness of breath. And as needed    . budesonide (PULMICORT) 0.25 MG/2ML nebulizer solution Take 2 mLs (0.25 mg total) by nebulization 2 (two) times daily. DX: J43.9 120 mL 12  . Cholecalciferol (VITAMIN D-3) 5000 UNITS TABS Take 1 tablet by mouth daily.    . formoterol (PERFOROMIST) 20 MCG/2ML nebulizer solution Take 2 mLs (20 mcg total) by nebulization 2 (two) times daily. DX: J43.9 120 mL 12  . hydrochlorothiazide (MICROZIDE) 12.5 MG capsule Take 12.5 mg by mouth daily. Pt takes according to BP reading.    . Multiple Vitamins-Minerals (MULTIVITAMIN & MINERAL PO) Take 1 tablet by mouth daily.    . potassium chloride SA (K-DUR,KLOR-CON) 20 MEQ tablet Take 1 tablet (20 mEq total) by mouth daily. 30 tablet 3  . predniSONE (DELTASONE) 10 MG tablet Take two daily x 7days then one daily and stay 60 tablet 6  . ranitidine (ZANTAC) 300 MG tablet Take 300 mg by mouth at bedtime. 1 tablet at bedtime    . rivaroxaban (XARELTO) 20 MG TABS tablet Take 1 tablet (20 mg total) by mouth daily with supper. 30 tablet 1  . sodium chloride (OCEAN) 0.65 % SOLN nasal spray Place 2 sprays into both nostrils as needed for congestion.    [name] Umeclidinium Bromide (INCRUSE ELLIPTA) 62.5 MCG/INH AEPB Inhale 1 puff into the lungs daily. 30 each 6  . verapamil (CALAN-SR) 240 MG CR tablet TAKE 1 TABLET  BY MOUTH EVERY DAY 30 tablet 2   No current facility-administered medications on file prior to visit.     Objective:  Objective Physical Exam  Constitutional: She is oriented to person, place, and time. She appears well-developed and well-nourished.  HENT:  Head: Normocephalic and atraumatic.  Eyes: Conjunctivae and EOM are normal.  Neck: Normal range of motion. Neck supple. No JVD present. Carotid bruit is not present. No thyromegaly present.  Cardiovascular: Normal rate, regular rhythm and normal heart sounds.   No murmur  heard. Pulmonary/Chest: Effort normal and breath sounds normal. No respiratory distress. She has no wheezes. She has no rales. She exhibits no tenderness.  Musculoskeletal: Normal range of motion. She exhibits no edema or tenderness.  Neurological: She is alert and oriented to person, place, and time. She has normal reflexes. She displays normal reflexes. She exhibits normal muscle tone.  Psychiatric: She has a normal mood and affect. Her behavior is normal.   BP 140/80 mmHg  Pulse 77  Temp(Src) 98.1 F (36.7 C) (Oral)  Wt 110 lb 9.6 oz (50.168 kg)  SpO2 94% Wt Readings from Last 3 Encounters:  02/08/15 110 lb 9.6 oz (50.168 kg)  01/29/15 115 lb (52.164 kg)  01/25/15 114 lb (51.71 kg)     Lab Results  Component Value Date   WBC 5.1 01/29/2015   HGB 11.8 01/29/2015   HCT 36.5 01/29/2015   PLT 282 01/29/2015   GLUCOSE 79 01/25/2015   ALT 26 11/22/2014   AST 47* 11/22/2014   NA 139 01/25/2015   K 3.8 01/25/2015   CL 101 01/25/2015   CREATININE 0.60 01/25/2015   BUN 11 01/25/2015   CO2 28 01/25/2015   TSH 1.350 02/02/2014    US Venous Img Upper Uni Left  01/25/2015   CLINICAL DATA:  Left upper extremity pain and edema ; area of palpable fullness in superior left forearm anteriorly  EXAM: LEFT UPPER EXTREMITY VENOUS DUPLEX ULTRASOUND  TECHNIQUE: Gray-scale sonography with graded compression, as well as color Doppler and duplex ultrasound were performed to evaluate the upper extremity deep venous system from the level of the subclavian vein and including the jugular, axillary, basilic, radial, ulnar and upper cephalic vein. Spectral Doppler was utilized to evaluate flow at rest and with distal augmentation maneuvers.  COMPARISON:  None.  FINDINGS: Contralateral Subclavian Vein: Respiratory phasicity is normal and symmetric with the symptomatic side. No evidence of thrombus. Normal compressibility.  Internal Jugular Vein: No evidence of thrombus. Normal compressibility, respiratory  phasicity and response to augmentation.  Subclavian Vein: No evidence of thrombus. Normal compressibility, respiratory phasicity and response to augmentation.  Axillary Vein: No evidence of thrombus. Normal compressibility, respiratory phasicity and response to augmentation.  Cephalic Vein: No evidence of thrombus. Normal compressibility, respiratory phasicity and response to augmentation.  Basilic Vein: No evidence of thrombus. Normal compressibility, respiratory phasicity and response to augmentation.  Brachial Veins: No evidence of thrombus. Normal compressibility, respiratory phasicity and response to augmentation.  Radial Veins: No evidence of thrombus. Normal compressibility, respiratory phasicity and response to augmentation.  Ulnar Veins: No evidence of thrombus. Normal compressibility, respiratory phasicity and response to augmentation.  Venous Reflux:  None visualized.  Other Findings: At the site of palpable fullness in the anterior left superior forearm, there is a hypoechoic structure with internal echoes centrally which shows noncompressibility and absence of blood flow. This appearance is consistent with a thrombosed superficial venous structure. This structure does not appear to represent a frank mass.  IMPRESSION:  No evidence of left upper extremity deep venous thrombosis. Evidence of superficial venous thrombosis in the area of palpable fullness in the left anterior superior forearm.   Electronically Signed   By: Lowella Grip III M.D.   On: 01/25/2015 12:36     Assessment & Plan:  Plan I am having Holly Page start on tiZANidine and acetaminophen-codeine. I am also having her maintain her albuterol, acetaminophen, ranitidine, hydrochlorothiazide, Vitamin D-3, verapamil, sodium chloride, formoterol, budesonide, potassium chloride SA, Multiple Vitamins-Minerals (MULTIVITAMIN & MINERAL PO), Umeclidinium Bromide, predniSONE, albuterol, and rivaroxaban.  Meds ordered this encounter    Medications  . tiZANidine (ZANAFLEX) 4 MG tablet    Sig: Take 1 tablet (4 mg total) by mouth every 6 (six) hours as needed for muscle spasms.    Dispense:  30 tablet    Refill:  0  . acetaminophen-codeine (TYLENOL #3) 300-30 MG per tablet    Sig: Take 1-2 tablets by mouth every 8 (eight) hours as needed for moderate pain or severe pain.    Dispense:  30 tablet    Refill:  0    Problem List Items Addressed This Visit    None    Visit Diagnoses    Back pain with radiation    -  Primary    Relevant Medications    tiZANidine (ZANAFLEX) 4 MG tablet    acetaminophen-codeine (TYLENOL #3) 300-30 MG per tablet    Other Relevant Orders    MR Lumbar Spine Wo Contrast       Follow-up: Return if symptoms worsen or fail to improve.  Garnet Koyanagi, DO

## 2015-02-12 ENCOUNTER — Telehealth: Payer: Self-pay | Admitting: Critical Care Medicine

## 2015-02-12 ENCOUNTER — Telehealth: Payer: Self-pay | Admitting: Family

## 2015-02-12 DIAGNOSIS — J439 Emphysema, unspecified: Secondary | ICD-10-CM

## 2015-02-12 MED ORDER — BUDESONIDE 0.25 MG/2ML IN SUSP: 0.2500 mg | Freq: Two times a day (BID) | RESPIRATORY_TRACT | Status: DC

## 2015-02-12 MED ORDER — VERAPAMIL HCL ER 240 MG PO TBCR
240.0000 mg | EXTENDED_RELEASE_TABLET | Freq: Every day | ORAL | Status: DC
Start: 2015-02-12 — End: 2015-03-12

## 2015-02-12 MED ORDER — FORMOTEROL FUMARATE 20 MCG/2ML IN NEBU: 20.0000 ug | INHALATION_SOLUTION | Freq: Two times a day (BID) | RESPIRATORY_TRACT | Status: DC

## 2015-02-12 NOTE — Telephone Encounter (Signed)
Called and spoke to pt. Pt questioning if her [facility]. [facility]- Per[facility]omist was not found in system so an rx was sent in to see how much the rx would be, pharmacy will have the rx in about 45 min. Pulmicort was $29.04 (cheaper than [facility], pt will want [facility]). Pt advised to call [facility] in 45 min to see how much Perforomist would be and if she would rather have it [facility] or [facility]. Pt will call back.

## 2015-02-12 NOTE — Telephone Encounter (Signed)
Calling back about her meds her budesomide needs to be changed and its expensive having meds issues please call [phone]

## 2015-02-12 NOTE — Telephone Encounter (Signed)
Refills sent, notified pt. 

## 2015-02-12 NOTE — Telephone Encounter (Signed)
Caller: Holly Page, self Ph#: [phone], home [facility] [place]  Pt needs refill on verapamil (CALAN-SR) 240 MG CR tablet. Pt taking 1/day. She is out of medication. She took last pill this morning. Pt asked if she can get more than 2 refills on RX.

## 2015-02-12 NOTE — Telephone Encounter (Signed)
Called and spoke to pt. Pt stated the perforomist is too expensive (> $100) at [facility] and [facility]. Pt is requesting a change in medications or recs since pt cannot take the Perforomist. ([facility]).   PW please advise further since pt can not take Performist d/t financial issues. Thanks.

## 2015-02-12 NOTE — Telephone Encounter (Signed)
Pt aware of rec's per PW Pt to take Polmicort BID and her Albuterol QID.  Pt to call back if any issues getting medication.  Nothing further needed.

## 2015-02-12 NOTE — Telephone Encounter (Signed)
Next choice is albuterol in nebulizer qid

## 2015-02-15 ENCOUNTER — Other Ambulatory Visit: Payer: Medicare Other

## 2015-02-16 ENCOUNTER — Other Ambulatory Visit (HOSPITAL_BASED_OUTPATIENT_CLINIC_OR_DEPARTMENT_OTHER): Payer: Self-pay | Admitting: Neurosurgery

## 2015-02-16 DIAGNOSIS — D320 Benign neoplasm of cerebral meninges: Secondary | ICD-10-CM

## 2015-02-17 ENCOUNTER — Ambulatory Visit (HOSPITAL_BASED_OUTPATIENT_CLINIC_OR_DEPARTMENT_OTHER)
Admission: RE | Admit: 2015-02-17 | Discharge: 2015-02-17 | Disposition: A | Discharge status: Home / Self Care | Payer: Medicare Other | Source: Ambulatory Visit | Attending: Family Medicine | Admitting: Family Medicine

## 2015-02-17 ENCOUNTER — Ambulatory Visit (HOSPITAL_BASED_OUTPATIENT_CLINIC_OR_DEPARTMENT_OTHER)
Admission: RE | Admit: 2015-02-17 | Discharge: 2015-02-17 | Disposition: A | Discharge status: Home / Self Care | Payer: Medicare Other | Source: Ambulatory Visit | Attending: Neurosurgery | Admitting: Neurosurgery

## 2015-02-17 ENCOUNTER — Encounter (HOSPITAL_BASED_OUTPATIENT_CLINIC_OR_DEPARTMENT_OTHER): Payer: Self-pay

## 2015-02-17 DIAGNOSIS — M545 Low back pain: Secondary | ICD-10-CM | POA: Insufficient documentation

## 2015-02-17 DIAGNOSIS — M549 Dorsalgia, unspecified: Secondary | ICD-10-CM

## 2015-02-17 DIAGNOSIS — M79604 Pain in right leg: Secondary | ICD-10-CM | POA: Diagnosis not present

## 2015-02-17 DIAGNOSIS — D320 Benign neoplasm of cerebral meninges: Secondary | ICD-10-CM

## 2015-02-17 MED ORDER — IOHEXOL 300 MG/ML  SOLN
80.0000 mL | Freq: Once | INTRAMUSCULAR | Status: AC | PRN
  Administered 2015-02-17: 80 mL via INTRAVENOUS

## 2015-03-01 ENCOUNTER — Ambulatory Visit (INDEPENDENT_AMBULATORY_CARE_PROVIDER_SITE_OTHER): Payer: Medicare Other | Admitting: Adult Health

## 2015-03-01 ENCOUNTER — Encounter: Payer: Self-pay | Admitting: Adult Health

## 2015-03-01 VITALS — BP 122/80 | HR 71 | Temp 98.1°F | Ht 65.0 in | Wt 113.0 lb

## 2015-03-01 DIAGNOSIS — R030 Elevated blood-pressure reading, without diagnosis of hypertension: Secondary | ICD-10-CM

## 2015-03-01 DIAGNOSIS — I809 Phlebitis and thrombophlebitis of unspecified site: Secondary | ICD-10-CM

## 2015-03-01 DIAGNOSIS — J439 Emphysema, unspecified: Secondary | ICD-10-CM

## 2015-03-01 DIAGNOSIS — I10 Essential (primary) hypertension: Secondary | ICD-10-CM | POA: Insufficient documentation

## 2015-03-01 MED ORDER — ARFORMOTEROL TARTRATE 15 MCG/2ML IN NEBU: 15.0000 ug | INHALATION_SOLUTION | Freq: Two times a day (BID) | RESPIRATORY_TRACT | Status: DC

## 2015-03-01 NOTE — Addendum Note (Signed)
Addended by: Melvenia Needles on: 03/01/2015 10:50 AM   Modules accepted: Miquel Dunn

## 2015-03-01 NOTE — Patient Instructions (Signed)
Continue on Budesonide  Neb Twice daily   Change Perforomist to [employer] Twice daily  (due to cost issue)  Go by pharmacy check to see if they can run through Medicare Part B to see if cheaper for you.  Follow up in 2 months and As needed   Please contact office for sooner follow up if symptoms do not improve or worsen or seek emergency care

## 2015-03-01 NOTE — Progress Notes (Signed)
Subjective:    Patient ID: Holly Page, female    DOB: December 17, 1935, 79 y.o.   MRN: [id number]  HPI    Review of Systems     Objective:   Physical Exam        Assessment & Plan:    Subjective:    Patient ID: Holly Page, female    DOB: January 14, 1936, 79 y.o.   MRN: [id number]  HPI  79 y.o. F with Gold D Copd primary emphysema on O2 >GOLD card patient  Hx of DVT -05/2014 (tx w/ xarelto until 12/2014 ) -followed by hematology  Left arm superficial venous thrombus , Xarelto 01/25/15 >followed by Hematology    03/01/2015 Follow up : COPD and DVT  Pt returns for 6 week follow up for COPD . Says she is doing very well on her regimen with budesonide and perforomist Was started on Incruse last ov but was not able to tolerate. She was previously intolerant to spiriva as well.  Does complain that insurance is charging her too much for perforomist now.  She was going thru DME company but became too high. Now gets nebs through pharmacy  We called and determined it will be cheaper for Brovana. She is going to try this  May have to cut brovana back to once daily as it will still cost her $ 80 month for brovana and 50-60$ for budesonide (>$100 for perforomist) We also check with [facility] pharmacy as well.  We looked at Medicare B and D filing but unable to get cheaper.  She is going to speak with her pharmacy to see if Medicare B filing would be cheaper.  She denies chest pain, orthopnea, edema , fever or increased albuterol use Last CXR 11/2014 with chronic change  PVX is utd.      . She had a thrombo-embolic event in the right inner inferior thigh. This happened in September 2015. She apparently had a DVT back in the 80s. She thinks she was on blood thinner for one month. She was treated with course of Xarelto 05/2014 to 12/2014 with repeat doppler showing no DVT , chronic thickening of the vein wall . She was taken off Xarelto 12/29/14.   01/25/15 she developed left arm pain and  swelling, found to have a superficial venous thrombus in the left arm. She was restarted on Xarelto with starter pack and maintenance rx. .  She says she is doing okay on Xarelto and arm pain and swelling have resolved.     Review of Systems  Constitutional:   No  weight loss, night sweats,  Fevers, chills, + fatigue, or  lassitude.  HEENT:   No headaches,  Difficulty swallowing,  Tooth/dental problems, or  Sore throat,                No sneezing, itching, ear ache, nasal congestion, post nasal drip,   CV:  No chest pain,  Orthopnea, PND, swelling in lower extremities, anasarca, dizziness, palpitations, syncope.   GI  Notes heartburn, no indigestion, abdominal pain, nausea, vomiting, diarrhea, change in bowel habits, loss of appetite, bloody stools.   Resp:  No excess mucus, no productive cough,  Notes non-productive cough,  No coughing up of blood.  No change in color of mucus.  No wheezing.  No chest wall deformity  Skin: no rash or lesions.  GU: no dysuria, change in color of urine, no urgency or frequency.  No flank pain, no hematuria   MS:  No joint  pain or swelling.  No decreased range of motion.  No back pain.  Psych:  No change in mood or affect. No depression or anxiety.  No memory loss.      Objective:   Physical Exam  BP 169/90 mmHg  Pulse 71  Temp(Src) 98.1 F (36.7 C) (Oral)  Ht 5\' 5"  (1.651 m)  Wt 113 lb (51.256 kg)  BMI 18.80 kg/m2  SpO2 97%  GEN: A/Ox3; pleasant , NAD, elderly and frail   HEENT:  [place]/AT,  EACs-clear, TMs-wnl, NOSE-clear, THROAT-clear, no lesions  NECK:  Supple w/ fair ROM; no JVD; normal carotid impulses w/o bruits; no thyromegaly or nodules palpated; no lymphadenopathy.  RESP  Diminished in bases w/ no wheezing or rhonchi.no accessory muscle use, no dullness to percussion  CARD:  RRR, no m/r/g  , no peripheral edema, pulses intact, no cyanosis or clubbing.  GI:   Soft & nt; nml bowel sounds; no organomegaly or masses  detected.  Musco: Warm bil, no deformities or joint swelling noted.   Neuro: alert, no focal deficits noted.    Skin: Warm, no lesions or rashes .  No results for input(s): HGB, HCT, WBC, PLT in the last 168 hours.  No results for input(s): PROBNP in the last 168 hours.  No results for input(s): NA, K, CL, CO2, GLUCOSE, BUN, CREATININE, CALCIUM, MG, PHOS in the last 168 hours.       Assessment & Plan:

## 2015-03-01 NOTE — Assessment & Plan Note (Addendum)
Continue on Budesonide  Neb Twice daily   Change Perforomist to [employer] Twice daily  (due to cost issue)  Go by pharmacy check to see if they can run through Medicare Part B to see if cheaper for you.  Follow up in 2 months and As needed   Prevnar on return  Please contact office for sooner follow up if symptoms do not improve or worsen or seek emergency care

## 2015-03-01 NOTE — Assessment & Plan Note (Signed)
Left arm Superficial venous thrombus >cont on xarelto  Follow up with Hematology as planned

## 2015-03-01 NOTE — Assessment & Plan Note (Addendum)
B/p was elevated on arrival  Recheck was normal~122 /80

## 2015-03-02 ENCOUNTER — Other Ambulatory Visit: Payer: Medicare Other

## 2015-03-05 ENCOUNTER — Telehealth: Payer: Self-pay | Admitting: Adult Health

## 2015-03-05 MED ORDER — ARFORMOTEROL TARTRATE 15 MCG/2ML IN NEBU: 15.0000 ug | INHALATION_SOLUTION | Freq: Two times a day (BID) | RESPIRATORY_TRACT | Status: DC

## 2015-03-05 NOTE — Telephone Encounter (Signed)
Patient says that when she went to [facility], they charged her $160 instead of $87.  She did not pick it up because it was too expensive.  She asked them why they told us it was $87 and then charge her $160 and they told her that they have no record of speaking to our office.  Patient will pick up samples in [place] on Thursday, but wants to know what she should do about medication long term.  TP - please advise.

## 2015-03-05 NOTE — Telephone Encounter (Signed)
Pt called asking if we could send the Brovana to [facility] in [address] [place] to see if it would be any cheaper there. Nothing further needed at this time.

## 2015-03-09 NOTE — Telephone Encounter (Signed)
Please call pharm

## 2015-03-09 NOTE — Telephone Encounter (Signed)
Tammy -please advise what you would like to do with this patient's medications.

## 2015-03-12 ENCOUNTER — Ambulatory Visit (HOSPITAL_BASED_OUTPATIENT_CLINIC_OR_DEPARTMENT_OTHER)
Admission: RE | Admit: 2015-03-12 | Discharge: 2015-03-12 | Disposition: A | Discharge status: Home / Self Care | Payer: Medicare Other | Source: Ambulatory Visit | Attending: Family | Admitting: Family

## 2015-03-12 ENCOUNTER — Other Ambulatory Visit: Payer: Medicare Other

## 2015-03-12 ENCOUNTER — Ambulatory Visit: Payer: Medicare Other | Admitting: Hematology & Oncology

## 2015-03-12 ENCOUNTER — Encounter: Payer: Self-pay | Admitting: Internal Medicine

## 2015-03-12 ENCOUNTER — Ambulatory Visit (HOSPITAL_BASED_OUTPATIENT_CLINIC_OR_DEPARTMENT_OTHER): Payer: Medicare Other | Admitting: Hematology & Oncology

## 2015-03-12 ENCOUNTER — Ambulatory Visit (INDEPENDENT_AMBULATORY_CARE_PROVIDER_SITE_OTHER): Payer: Medicare Other | Admitting: Internal Medicine

## 2015-03-12 ENCOUNTER — Ambulatory Visit (HOSPITAL_BASED_OUTPATIENT_CLINIC_OR_DEPARTMENT_OTHER): Admission: RE | Admit: 2015-03-12 | Payer: Medicare Other | Source: Ambulatory Visit

## 2015-03-12 ENCOUNTER — Other Ambulatory Visit (HOSPITAL_BASED_OUTPATIENT_CLINIC_OR_DEPARTMENT_OTHER): Payer: Medicare Other

## 2015-03-12 ENCOUNTER — Other Ambulatory Visit: Payer: Self-pay | Admitting: Family

## 2015-03-12 ENCOUNTER — Encounter: Payer: Self-pay | Admitting: Hematology & Oncology

## 2015-03-12 VITALS — BP 148/92 | HR 69 | Temp 97.5°F | Ht 65.0 in | Wt 111.4 lb

## 2015-03-12 VITALS — BP 175/77 | HR 81 | Temp 97.5°F | Resp 20 | Wt 112.0 lb

## 2015-03-12 DIAGNOSIS — I808 Phlebitis and thrombophlebitis of other sites: Secondary | ICD-10-CM

## 2015-03-12 DIAGNOSIS — I10 Essential (primary) hypertension: Secondary | ICD-10-CM

## 2015-03-12 DIAGNOSIS — I809 Phlebitis and thrombophlebitis of unspecified site: Secondary | ICD-10-CM

## 2015-03-12 DIAGNOSIS — I82612 Acute embolism and thrombosis of superficial veins of left upper extremity: Secondary | ICD-10-CM | POA: Diagnosis present

## 2015-03-12 DIAGNOSIS — I82402 Acute embolism and thrombosis of unspecified deep veins of left lower extremity: Secondary | ICD-10-CM

## 2015-03-12 LAB — CBC WITH DIFFERENTIAL (CANCER CENTER ONLY)
BASO#: 0 10*3/uL (ref 0.0–0.2)
BASO%: 0.2 % (ref 0.0–2.0)
EOS%: 0.1 % (ref 0.0–7.0)
Eosinophils Absolute: 0 10*3/uL (ref 0.0–0.5)
HCT: 41 % (ref 34.8–46.6)
HGB: 13.1 g/dL (ref 11.6–15.9)
LYMPH#: 1.4 10*3/uL (ref 0.9–3.3)
LYMPH%: 15.1 % (ref 14.0–48.0)
MCH: 22.8 pg — AB (ref 26.0–34.0)
MCHC: 32 g/dL (ref 32.0–36.0)
MCV: 71 fL — AB (ref 81–101)
MONO#: 0.2 10*3/uL (ref 0.1–0.9)
MONO%: 2.2 % (ref 0.0–13.0)
NEUT#: 7.4 10*3/uL — ABNORMAL HIGH (ref 1.5–6.5)
NEUT%: 82.4 % — ABNORMAL HIGH (ref 39.6–80.0)
PLATELETS: 296 10*3/uL (ref 145–400)
RBC: 5.75 10*6/uL — ABNORMAL HIGH (ref 3.70–5.32)
RDW: 16.8 % — AB (ref 11.1–15.7)
WBC: 9 10*3/uL (ref 3.9–10.0)

## 2015-03-12 MED ORDER — VERAPAMIL HCL ER 240 MG PO TBCR: 360.0000 mg | EXTENDED_RELEASE_TABLET | Freq: Every day | ORAL | Status: DC

## 2015-03-12 NOTE — Telephone Encounter (Signed)
i would STOP budesonide and use just brovana bid  See how that cost works out

## 2015-03-12 NOTE — Progress Notes (Signed)
Pre visit review using our clinic review tool, if applicable. No additional management support is needed unless otherwise documented below in the visit note. 

## 2015-03-12 NOTE — Telephone Encounter (Signed)
Pt returning call - [phone]

## 2015-03-12 NOTE — Telephone Encounter (Signed)
Spoke with patient, made aware of rec's per PW Pt states that financially she cannot afford anything above what she is currently paying for Budesonide ($57/mo) Pt would like other options of medications to try.  Please advise Dr Joya Gaskins. Thanks.

## 2015-03-12 NOTE — Telephone Encounter (Signed)
Called [facility] and spoke with the pharmacy. Was told this medication is normally $812.00. Pt is responsible for $160 after insurance pays there part.  Per previous phone note this is the same price through [facility] as well. She reports she can't afford this.  She currently takes budesonide in neb 0.25 BID and has albuterol for her nebulizer. She wants to know what else can replace the brovana if anything? Please advise PW thanks

## 2015-03-12 NOTE — Patient Instructions (Signed)
Increase verapamil to 1.5 tablets daily  Check the  blood pressure 2 or 3 times a week  Be sure your blood pressure is between 110/65 and  145/85.  if it is consistently higher or lower, let me know  Call if your BP is not well controlled

## 2015-03-12 NOTE — Progress Notes (Signed)
Hematology and Oncology Follow Up Visit  Holly Page [id number] June 12, 1936 79 y.o. 03/12/2015   Principle Diagnosis:  Thromboembolic disease of the right thigh Superficial thrombophlebitis of the left arm  Current Therapy:   Xarelto 20 mg by mouth daily    Interim History:  Holly Page is here today for follow-up. She had an [place] of her left arm l today. There is no evidence of deep venous thrombosis or superficial thrombophlebitis. Her left arm is doing much better.  I think that she probably needs to be on long-term anticoagulation. Even though her hypercoagulable studies are normal, she has showings that she wants to have thromboembolic disease.  She wears oxygen all the time. She has bad pulmonary disease.  She's had no bleeding. She's had no problems with bowels or bladder. There's been no nausea or vomiting.  There's been no leg swelling.  She's had no rashes.  Overall, her performance status is ECOG 2. .   Medications:    Medication List       This list is accurate as of: 03/12/15 12:25 PM.  Always use your most recent med list.               acetaminophen 325 MG tablet  Commonly known as:  TYLENOL  Take 2 tablets (650 mg total) by mouth every 6 (six) hours as needed.     acetaminophen-codeine 300-30 MG per tablet  Commonly known as:  TYLENOL #3  Take 1-2 tablets by mouth every 8 (eight) hours as needed for moderate pain or severe pain.     albuterol (2.5 MG/3ML) 0.083% nebulizer solution  Commonly known as:  PROVENTIL  Take 2.5 mg by nebulization every 6 (six) hours as needed for shortness of breath. And as needed     albuterol 108 (90 BASE) MCG/ACT inhaler  Commonly known as:  PROVENTIL HFA;VENTOLIN HFA  Inhale 2 puffs into the lungs every 6 (six) hours as needed for wheezing or shortness of breath.     arformoterol 15 MCG/2ML Nebu  Commonly known as:  BROVANA  Take 2 mLs (15 mcg total) by nebulization 2 (two) times daily.     budesonide 0.25  MG/2ML nebulizer solution  Commonly known as:  PULMICORT  Take 2 mLs (0.25 mg total) by nebulization 2 (two) times daily. DX: J43.9     MICROZIDE 12.5 MG capsule  Generic drug:  hydrochlorothiazide  Take 12.5 mg by mouth daily. Pt takes according to BP reading.     MULTIVITAMIN & MINERAL PO  Take 1 tablet by mouth daily.     potassium chloride SA 20 MEQ tablet  Commonly known as:  K-DUR,KLOR-CON  Take 1 tablet (20 mEq total) by mouth daily.     predniSONE 10 MG tablet  Commonly known as:  DELTASONE  Take two daily x 7days then one daily and stay     ranitidine 300 MG tablet  Commonly known as:  ZANTAC  Take 300 mg by mouth at bedtime. 1 tablet at bedtime     rivaroxaban 20 MG Tabs tablet  Commonly known as:  XARELTO  Take 1 tablet (20 mg total) by mouth daily with supper.     sodium chloride 0.65 % Soln nasal spray  Commonly known as:  OCEAN  Place 2 sprays into both nostrils as needed for congestion.     tiZANidine 4 MG tablet  Commonly known as:  ZANAFLEX  Take 1 tablet (4 mg total) by mouth every 6 (six) hours as needed  for muscle spasms.     verapamil 240 MG CR tablet  Commonly known as:  CALAN-SR  Take 1 tablet (240 mg total) by mouth daily.     Vitamin D-3 5000 UNITS Tabs  Take 1 tablet by mouth daily.        Allergies:  Allergies  Allergen Reactions  . Incruse Ellipta [Umeclidinium Bromide] Shortness Of Breath  . Tramadol Shortness Of Breath  . Amlodipine     Other reaction(s): SWELLING  . Aspirin     Other reaction(s): PALPITATIONS Other reaction(s): DIFFICULTY BREATHING Heart flutter  . Codeine Other (See Comments)    Hallucinations, can take Tylenol #3 now  . Doxycycline     Other reaction(s): NAUSEA,VOMITING  . Hydrocodone     hallucinations  . Losartan     unknown  . Propoxyphene Nausea Only    Other reaction(s): NAUSEA  . Tiotropium Itching and Rash    Past Medical History, Surgical history, Social history, and Family History were  reviewed and updated.  Review of Systems: All other 10 point review of systems is negative.   Physical Exam:  weight is 112 lb (50.803 kg). Her oral temperature is 97.5 F (36.4 C). Her blood pressure is 175/77 and her pulse is 81. Her respiration is 20.   Wt Readings from Last 3 Encounters:  03/12/15 112 lb (50.803 kg)  03/01/15 113 lb (51.256 kg)  02/08/15 110 lb 9.6 oz (50.168 kg)    Well-developed and well-nourished [place] American female. Her head and neck exam shows no ocular or oral lesions. She has no palpable cervical or supraclavicular lymph nodes. Lungs are clear. Cardiac exam regular rate and rhythm with no murmurs, rubs or bruits. Abdomen is soft. She has good bowel sounds. There is no fluid wave. There is no palpable liver or spleen tip. Back exam shows no tenderness over the spine, ribs or hips. Extremities shows no swelling of the left arm. There is no venous cord noticed in the left upper arm. There is no palpable venous cords in the legs. She has good range of motion of her joints. She may have some slight nonpitting edema of the lower legs. Skin exam shows no rashes, ecchymoses or petechia. Lab Results  Component Value Date   WBC 9.0 03/12/2015   HGB 13.1 03/12/2015   HCT 41.0 03/12/2015   MCV 71* 03/12/2015   PLT 296 03/12/2015   Lab Results  Component Value Date   FERRITIN 209.1 12/19/2014   IRON 66 12/21/2014   TIBC 342 12/21/2014   UIBC 276 12/21/2014   IRONPCTSAT 19* 12/21/2014   Lab Results  Component Value Date   RBC 5.75* 03/12/2015   No results found for: KPAFRELGTCHN, LAMBDASER, KAPLAMBRATIO No results found for: IGGSERUM, IGA, IGMSERUM No results found for: Ronnald Ramp, A1GS, Nelida Meuse, SPEI   Chemistry      Component Value Date/Time   NA 139 01/25/2015 1010   K 3.8 01/25/2015 1010   CL 101 01/25/2015 1010   CO2 28 01/25/2015 1010   BUN 11 01/25/2015 1010   CREATININE 0.60 01/25/2015 1010   CREATININE  0.84 12/28/2014 1307      Component Value Date/Time   CALCIUM 9.5 01/25/2015 1010   ALKPHOS 71 11/22/2014 1118   AST 47* 11/22/2014 1118   ALT 26 11/22/2014 1118   BILITOT 0.4 11/22/2014 1118     Impression and Plan: Holly Page is 79 year old African-American female.   For now, I will keep her on  Xarelto. She is on 20 mg a day.  She's doing well with this.  I just think that she is hypercoagulable.  We may have to consider some hypercoagulable studies when we see her back.  I want to see her back in 3 months.  I spent about 30 minutes with her. I went over her recent Doppler report. She is quite pleased with this.   Volanda Napoleon, MD 6/13/201612:25 PM

## 2015-03-12 NOTE — Progress Notes (Signed)
Subjective:    Patient ID: Holly Page, female    DOB: 1936-08-10, 79 y.o.   MRN: [id number]  DOS:  03/12/2015 Type of visit - description : acute Interval history: BP elevated a few times lately, thinks could be related to verapamil--> the new pill supply look different than usual tablets .    Review of Systems  Denies lower extremity edema, difficulty breathing is at baseline. No nausea vomiting. Occasional headaches which is nothing new to her. No change in her sodium intake, not taking NSAIDs.  Past Medical History  Diagnosis Date  . Pneumonia   . COPD (chronic obstructive pulmonary disease)   . Hypertension   . DVT (deep venous thrombosis) 1980s, recurrent 2015    Right DVT 2015  on xarelto  . GERD (gastroesophageal reflux disease)   . Lactose intolerance   . Spinal stenosis   . Meningioma     followed by Dr Christella Noa    Past Surgical History  Procedure Laterality Date  . Foot surgery    . Cholecystectomy    . Tonsillectomy    . Tubal ligation      History   Social History  . Marital Status: Divorced    Spouse Name: N/A  . Number of Children: 4  . Years of Education: college   Occupational History  . Retired    Social History Main Topics  . Smoking status: Former Smoker -- 1.50 packs/day for 56 years    Types: Cigarettes    Quit date: 04/29/2012  . Smokeless tobacco: Never Used     Comment: quit smoking 3 years ago  . Alcohol Use: No     Comment: quit drinking beer 2005  . Drug Use: No  . Sexual Activity: Not on file   Other Topics Concern  . Not on file   Social History Narrative   Patient lives at home alone- divorced, no pets.     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  Always use your most recent med list.               acetaminophen 325 MG tablet  Commonly known as:  TYLENOL  Take 2 tablets (650 mg total) by mouth every 6 (six) hours as needed.     acetaminophen-codeine 300-30 MG per tablet  Commonly known as:  TYLENOL #3  Take 1-2 tablets by mouth every 8 (eight) hours as needed for moderate pain or severe pain.     albuterol (2.5 MG/3ML) 0.083% nebulizer solution  Commonly known as:  PROVENTIL  Take 2.5 mg by nebulization every 6 (six) hours as needed for shortness of breath. And as needed     albuterol 108 (90 BASE) MCG/ACT inhaler  Commonly known as:  PROVENTIL HFA;VENTOLIN HFA  Inhale 2 puffs into the lungs every 6 (six) hours as needed for wheezing or shortness of breath.     arformoterol 15 MCG/2ML Nebu  Commonly known as:  BROVANA  Take 2 mLs (15 mcg total) by nebulization 2 (two) times daily.     budesonide 0.25 MG/2ML nebulizer solution  Commonly known as:  PULMICORT  Take 2 mLs (0.25 mg total) by nebulization 2 (two) times daily. DX: J43.9     MICROZIDE 12.5 MG capsule  Generic drug:  hydrochlorothiazide  Take 12.5 mg by mouth daily. Pt takes according to BP reading.     MULTIVITAMIN & MINERAL PO  Take 1 tablet by mouth daily.     potassium chloride SA 20 MEQ tablet  Commonly known as:  K-DUR,KLOR-CON  Take 1 tablet (20 mEq total) by mouth daily.     predniSONE 10 MG tablet  Commonly known as:  DELTASONE  Take two daily x 7days then one daily and stay     ranitidine 300 MG tablet  Commonly known as:  ZANTAC  Take 300 mg by mouth at bedtime. 1 tablet at bedtime     rivaroxaban 20 MG Tabs tablet  Commonly known as:  XARELTO  Take 1 tablet (20 mg total) by mouth daily with supper.     sodium chloride 0.65 % Soln nasal spray  Commonly known as:  OCEAN  Place 2 sprays into both nostrils as needed for congestion.     tiZANidine 4 MG tablet  Commonly known as:  ZANAFLEX  Take 1 tablet (4 mg total) by mouth every  6 (six) hours as needed for muscle spasms.     verapamil 240 MG CR tablet  Commonly known as:  CALAN-SR  Take 1.5 tablets (360 mg total) by mouth daily.     Vitamin D-3 5000 UNITS Tabs  Take 1 tablet by mouth daily.           Objective:   Physical Exam BP 148/92 mmHg  Pulse 69  Temp(Src) 97.5 F (36.4 C) (Oral)  Ht 5\' 5"  (1.651 m)  Wt 111 lb 6 oz (50.519 kg)  BMI 18.53 kg/m2  SpO2 96%  General:   Well developed, well nourished . NAD.  HEENT:  Normocephalic . Face symmetric, atraumatic Lungs:  Decrease breath sounds Normal respiratory effort, no intercostal retractions, no accessory muscle use. Heart: RRR,  no murmur.  No pretibial edema bilaterally  Skin: Not pale. Not jaundice Neurologic:  alert & oriented X3.  Speech normal, gait appropriate for age and unassisted Psych--  Cognition and judgment appear intact.  Cooperative with normal attention span and concentration.  Behavior appropriate. No anxious or depressed appearing.       Assessment & Plan:    Hypertension Chart reviewed: BP 03/01/2015-- normal BP today earlier 177/77,   at this office 148/92 BP has been slightly elevated today, patient quite concerned about it. I suggested maybe increase the hydrochlorothiazide but she declined Plan:  Increase verapamil 240 to 1.5 tablet daily, other medications the same, check her blood pressure closely. Also recommend to check with his pharmacist to be sure that the tablets she was dispensed were truly verapamil. Patient in agreement

## 2015-03-12 NOTE — Telephone Encounter (Signed)
Unfortunately only other option is albuterol by nebulizer 4x daily.

## 2015-03-12 NOTE — Telephone Encounter (Signed)
Pt states that she has plenty of albuterol to last her for a while. Will start using Albuterol QID once she finished up her supply of Perforomist.  Nothing further needed.

## 2015-03-12 NOTE — Telephone Encounter (Signed)
I called [facility] and asked them about the quote they gave me on the phone.  She told me that the $87 was for 1 box, and it cost patient $160 for 2 boxes.  Patient is taking it twice a day and needed 120 vials instead of 60 vials.  The cost of Brovana without insurance is $900.  (Per Dr. Bettina Gavia recommendation:  STOP Budesonide and use just Brovana BID.) left message for patient to call back.

## 2015-03-13 LAB — D-DIMER, QUANTITATIVE: D-Dimer, Quant: 0.27 ug/mL-FEU (ref 0.00–0.48)

## 2015-03-15 ENCOUNTER — Encounter: Payer: Self-pay | Admitting: Adult Health

## 2015-03-15 ENCOUNTER — Ambulatory Visit (INDEPENDENT_AMBULATORY_CARE_PROVIDER_SITE_OTHER): Payer: Medicare Other | Admitting: Adult Health

## 2015-03-15 ENCOUNTER — Other Ambulatory Visit: Payer: Self-pay | Admitting: *Deleted

## 2015-03-15 VITALS — BP 120/60 | HR 68 | Temp 98.0°F | Ht 65.0 in | Wt 113.0 lb

## 2015-03-15 DIAGNOSIS — J439 Emphysema, unspecified: Secondary | ICD-10-CM | POA: Diagnosis not present

## 2015-03-15 MED ORDER — ARFORMOTEROL TARTRATE 15 MCG/2ML IN NEBU: INHALATION_SOLUTION | RESPIRATORY_TRACT | Status: DC

## 2015-03-15 MED ORDER — LEVALBUTEROL HCL 0.63 MG/3ML IN NEBU
0.6300 mg | INHALATION_SOLUTION | Freq: Once | RESPIRATORY_TRACT | Status: AC
  Administered 2015-03-15: 0.63 mg via RESPIRATORY_TRACT

## 2015-03-15 NOTE — Addendum Note (Signed)
Addended by: Mathis Dad on: 03/15/2015 10:08 AM   Modules accepted: Orders

## 2015-03-15 NOTE — Assessment & Plan Note (Signed)
Flare  xopenex neb x 1 in office   Plan  Increase Prednisone 10mg  2 tabs daily for 1 week then back to 10mg  daily  Continue on Budesonide Neb Twice daily   Change to [employer] daily  Use Ventolin  or Albuterol Neb As needed  -emergency inhaler.   Follow up in 1-2 months and As needed   Please contact office for sooner follow up if symptoms do not improve or worsen or seek emergency care

## 2015-03-15 NOTE — Progress Notes (Signed)
Subjective:    Patient ID: Holly Page, female    DOB: 23-Nov-1935, 79 y.o.   MRN: [id number]  HPI  79 y.o. F with Gold D Copd primary emphysema on O2 >GOLD card patient  Hx of DVT -05/2014 (tx w/ xarelto until 12/2014 ) -followed by hematology  Left arm superficial venous thrombus , Xarelto 01/25/15 >followed by Hematology    03/01/2015 Follow up : COPD and DVT  Pt returns for 6 week follow up for COPD . Says she is doing very well on her regimen with budesonide and perforomist Was started on Incruse last ov but was not able to tolerate. She was previously intolerant to spiriva as well.  Does complain that insurance is charging her too much for perforomist now.  She was going thru DME company but became too high. Now gets nebs through pharmacy  We called and determined it will be cheaper for Brovana. She is going to try this  May have to cut brovana back to once daily as it will still cost her $ 80 month for brovana and 50-60$ for budesonide (>$100 for perforomist) We also check with [facility] pharmacy as well.  We looked at Medicare B and D filing but unable to get cheaper.  She is going to speak with her pharmacy to see if Medicare B filing would be cheaper.  She denies chest pain, orthopnea, edema , fever or increased albuterol use Last CXR 11/2014 with chronic change  PVX is utd.   She had a thrombo-embolic event in the right inner inferior thigh. This happened in September 2015. She apparently had a DVT back in the 80s. She thinks she was on blood thinner for one month. She was treated with course of Xarelto 05/2014 to 12/2014 with repeat doppler showing no DVT , chronic thickening of the vein wall . She was taken off Xarelto 12/29/14.   01/25/15 she developed left arm pain and swelling, found to have a superficial venous thrombus in the left arm. She was restarted on Xarelto with starter pack and maintenance rx. .  She says she is doing okay on Xarelto and arm pain and swelling have  resolved.   >changed to [facility]    03/15/2015 Acute OV : COPD flare  Pt presents for an acute office visit Complains of 1 week of increased dyspnea, DOE , wheezing and tightness.  Got upset at pharmacy b/c they would not fill Brovana.  We called pharmacy and cost is $80 for 30 vials and $160 for 60 vials We discussed using Brovana daily instead of Twice daily  To save money  Advised if breathing declines to alert office. Finances are major issue for pt.  She says she can try to afford $80 . We switched from inhalers to nebs due to high  Of cost with inhalers in past  She has not tolerated tudorza or spiriva in past.  No fever, cough, congestion or edema or hemoptysis .  Last cxr 11/2014 with chronic changes.  Gets anxious easily . Things upset her and then she get more winded.  We discussed relaxation techniques.  On chronic prednisone 10mg  daily   Review of Systems  Constitutional:   No  weight loss, night sweats,  Fevers, chills, + fatigue, or  lassitude.  HEENT:   No headaches,  Difficulty swallowing,  Tooth/dental problems, or  Sore throat,                No sneezing, itching, ear ache,  nasal congestion, post nasal drip,   CV:  No chest pain,  Orthopnea, PND, swelling in lower extremities, anasarca, dizziness, palpitations, syncope.   GI  Notes heartburn, no indigestion, abdominal pain, nausea, vomiting, diarrhea, change in bowel habits, loss of appetite, bloody stools.   Resp:  No excess mucus, no productive cough,  Notes non-productive cough,  No coughing up of blood.  No change in color of mucus.    No chest wall deformity  Skin: no rash or lesions.  GU: no dysuria, change in color of urine, no urgency or frequency.  No flank pain, no hematuria   MS:  No joint pain or swelling.  No decreased range of motion.  No back pain.  Psych:  No change in mood or affect.+anxiety.  No memory loss.      Objective:   Physical Exam  GEN: A/Ox3; pleasant , NAD, elderly and frail     HEENT:  [place]/AT,  EACs-clear, TMs-wnl, NOSE-clear, THROAT-clear, no lesions  NECK:  Supple w/ fair ROM; no JVD; normal carotid impulses w/o bruits; no thyromegaly or nodules palpated; no lymphadenopathy.  RESP  Diminished in bases w/ no wheezing or rhonchi.no accessory muscle use, no dullness to percussion  CARD:  RRR, no m/r/g  , no peripheral edema, pulses intact, no cyanosis or clubbing.  GI:   Soft & nt; nml bowel sounds; no organomegaly or masses detected.  Musco: Warm bil, no deformities or joint swelling noted.   Neuro: alert, no focal deficits noted.    Skin: Warm, no lesions or rashes .       Assessment & Plan:

## 2015-03-15 NOTE — Patient Instructions (Signed)
Increase Prednisone 10mg  2 tabs daily for 1 week then back to 10mg  daily  Continue on Budesonide Neb Twice daily   Change to [employer] daily  Use Ventolin  or Albuterol Neb As needed  -emergency inhaler.   Follow up in 1-2 months and As needed   Please contact office for sooner follow up if symptoms do not improve or worsen or seek emergency care

## 2015-03-19 ENCOUNTER — Encounter: Payer: Self-pay | Admitting: Family

## 2015-03-19 ENCOUNTER — Ambulatory Visit (INDEPENDENT_AMBULATORY_CARE_PROVIDER_SITE_OTHER): Payer: Medicare Other | Admitting: Family

## 2015-03-19 ENCOUNTER — Ambulatory Visit (HOSPITAL_BASED_OUTPATIENT_CLINIC_OR_DEPARTMENT_OTHER)
Admission: RE | Admit: 2015-03-19 | Discharge: 2015-03-19 | Disposition: A | Discharge status: Home / Self Care | Payer: Medicare Other | Source: Ambulatory Visit | Attending: Family | Admitting: Family

## 2015-03-19 ENCOUNTER — Telehealth: Payer: Self-pay | Admitting: Family

## 2015-03-19 ENCOUNTER — Encounter (HOSPITAL_BASED_OUTPATIENT_CLINIC_OR_DEPARTMENT_OTHER): Payer: Self-pay

## 2015-03-19 VITALS — BP 162/90 | HR 56 | Temp 97.9°F | Resp 16 | Ht 65.25 in | Wt 113.2 lb

## 2015-03-19 DIAGNOSIS — K7689 Other specified diseases of liver: Secondary | ICD-10-CM | POA: Diagnosis not present

## 2015-03-19 DIAGNOSIS — I10 Essential (primary) hypertension: Secondary | ICD-10-CM

## 2015-03-19 DIAGNOSIS — R079 Chest pain, unspecified: Secondary | ICD-10-CM | POA: Diagnosis not present

## 2015-03-19 DIAGNOSIS — J449 Chronic obstructive pulmonary disease, unspecified: Secondary | ICD-10-CM | POA: Diagnosis not present

## 2015-03-19 DIAGNOSIS — M545 Low back pain: Secondary | ICD-10-CM

## 2015-03-19 DIAGNOSIS — E876 Hypokalemia: Secondary | ICD-10-CM

## 2015-03-19 DIAGNOSIS — R7989 Other specified abnormal findings of blood chemistry: Secondary | ICD-10-CM

## 2015-03-19 DIAGNOSIS — D649 Anemia, unspecified: Secondary | ICD-10-CM

## 2015-03-19 DIAGNOSIS — R0789 Other chest pain: Secondary | ICD-10-CM | POA: Diagnosis not present

## 2015-03-19 DIAGNOSIS — M549 Dorsalgia, unspecified: Secondary | ICD-10-CM | POA: Diagnosis not present

## 2015-03-19 DIAGNOSIS — R945 Abnormal results of liver function studies: Secondary | ICD-10-CM | POA: Insufficient documentation

## 2015-03-19 LAB — BASIC METABOLIC PANEL
BUN: 14 mg/dL (ref 6–23)
CO2: 34 meq/L — AB (ref 19–32)
Calcium: 10 mg/dL (ref 8.4–10.5)
Chloride: 99 mEq/L (ref 96–112)
Creatinine, Ser: 0.59 mg/dL (ref 0.40–1.20)
GFR: 127.19 mL/min (ref >60.00)
GLUCOSE: 110 mg/dL — AB (ref 70–99)
POTASSIUM: 3.4 meq/L — AB (ref 3.5–5.1)
Sodium: 140 mEq/L (ref 135–145)

## 2015-03-19 LAB — HEPATIC FUNCTION PANEL
ALBUMIN: 4.7 g/dL (ref 3.5–5.2)
ALT: 22 U/L (ref 0–35)
AST: 45 U/L — ABNORMAL HIGH (ref 0–37)
Alkaline Phosphatase: 63 U/L (ref 39–117)
BILIRUBIN DIRECT: 0.1 mg/dL (ref 0.0–0.3)
TOTAL PROTEIN: 7.5 g/dL (ref 6.0–8.3)
Total Bilirubin: 0.5 mg/dL (ref 0.2–1.2)

## 2015-03-19 MED ORDER — ACETAMINOPHEN-CODEINE #3 300-30 MG PO TABS: 1.0000 | ORAL_TABLET | Freq: Three times a day (TID) | ORAL | Status: DC | PRN

## 2015-03-19 MED ORDER — TIZANIDINE HCL 4 MG PO TABS: 4.0000 mg | ORAL_TABLET | Freq: Four times a day (QID) | ORAL | Status: DC | PRN

## 2015-03-19 MED ORDER — CLONIDINE HCL 0.1 MG PO TABS: 0.1000 mg | ORAL_TABLET | Freq: Two times a day (BID) | ORAL | Status: DC

## 2015-03-19 MED ORDER — IOHEXOL 350 MG/ML SOLN
100.0000 mL | Freq: Once | INTRAVENOUS | Status: AC | PRN
  Administered 2015-03-19: 100 mL via INTRAVENOUS

## 2015-03-19 NOTE — Assessment & Plan Note (Signed)
H/H normal. Continue multivitamin with minerals.

## 2015-03-19 NOTE — Assessment & Plan Note (Signed)
EKG is performed and personally reviewed- notes no acute changes.  Will obtain CTA chest (and stat bmet to assess renal function) need to rule out recurrent PE.

## 2015-03-19 NOTE — Progress Notes (Signed)
Subjective:    Patient ID: Holly Page, female    DOB: 14-Nov-1935, 79 y.o.   MRN: [id number]  HPI  [name] is a 79 yr old female who presents today for follow up of multiple medical problems:  1) HTN-  Patient is currently maintained on the following medications for blood pressure:(did increase the verapamil to 1.5 tabs)- per Dr. Ethel Rana recommendation, however she stopped HCTZ (does not want to take due to increased frequency of urination).  Patient denies swelling. Reports that she has been having some aching in her left chest.  Has been present x 1 week. Worried about possibility of recurrent PE.  Last 3 blood pressure readings in our office are as follows:   BP Readings from Last 3 Encounters:  03/19/15 162/90  03/15/15 120/60  03/12/15 148/92    2) Anemia-   Lab Results  Component Value Date   WBC 9.0 03/12/2015   HGB 13.1 03/12/2015   HCT 41.0 03/12/2015   MCV 71* 03/12/2015   PLT 296 03/12/2015   3) Hypokalemia- potassium was checked last visit and was normal at 3.8.    4) abnormal lft-  Lab Results  Component Value Date   ALT 26 11/22/2014   AST 47* 11/22/2014   ALKPHOS 71 11/22/2014   BILITOT 0.4 11/22/2014   5) Back Pain- reports that she is not a surgical candidate until she is off of anticoagulation. She is on xarelto and is being followed by hematology who plans a 6 month course of xarelto.      Review of Systems See HPI  Past Medical History  Diagnosis Date  . Pneumonia   . COPD (chronic obstructive pulmonary disease)   . Hypertension   . DVT (deep venous thrombosis) 1980s, recurrent 2015    Right DVT 2015  on xarelto  . GERD (gastroesophageal reflux disease)   . Lactose intolerance   . Spinal stenosis   . Meningioma     followed by Dr Christella Noa    History   Social History  . Marital Status: Divorced    Spouse Name: N/A  . Number of Children: 4  . Years of Education: college   Occupational History  . Retired    Social  History Main Topics  . Smoking status: Former Smoker -- 1.50 packs/day for 56 years    Types: Cigarettes    Quit date: 04/29/2012  . Smokeless tobacco: Never Used     Comment: quit smoking 3 years ago  . Alcohol Use: No     Comment: quit drinking beer 2005  . Drug Use: No  . Sexual Activity: Not on file   Other Topics Concern  . Not on file   Social History Narrative   Patient lives at home alone- divorced, no pets.     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 Foot surgery    . Cholecystectomy    . Tonsillectomy    . Tubal ligation      Family History  Problem Relation Age of Onset  . Lupus Sister   . COPD Father     smoker deceased.   [name] Heart attack Son  Allergies  Allergen Reactions  . Incruse Ellipta [Umeclidinium Bromide] Shortness Of Breath  . Tramadol Shortness Of Breath  . Amlodipine     Other reaction(s): SWELLING  . Aspirin     Other reaction(s): PALPITATIONS Other reaction(s): DIFFICULTY BREATHING Heart flutter  . Codeine Other (See Comments)    Hallucinations, can take Tylenol #3 now  . Doxycycline     Other reaction(s): NAUSEA,VOMITING  . Hydrocodone     hallucinations  . Losartan     unknown  . Propoxyphene Nausea Only    Other reaction(s): NAUSEA  . Tiotropium Itching and Rash    Current Outpatient Prescriptions on File Prior to Visit  Medication Sig Dispense Refill  . acetaminophen (TYLENOL) 325 MG tablet Take 2 tablets (650 mg total) by mouth every 6 (six) hours as needed. 15 tablet 0  . albuterol (PROVENTIL HFA;VENTOLIN HFA) 108 (90 BASE) MCG/ACT inhaler Inhale 2 puffs into the lungs every 6 (six) hours as needed for wheezing or shortness of breath. 1 Inhaler 3  . albuterol (PROVENTIL) (2.5  MG/3ML) 0.083% nebulizer solution Take 2.5 mg by nebulization every 6 (six) hours as needed for shortness of breath. And as needed    . arformoterol (BROVANA) 15 MCG/2ML NEBU 1 vial daily 60 mL 11  . budesonide (PULMICORT) 0.25 MG/2ML nebulizer solution Take 2 mLs (0.25 mg total) by nebulization 2 (two) times daily. DX: J43.9 120 mL 12  . Cholecalciferol (VITAMIN D-3) 5000 UNITS TABS Take 1 tablet by mouth daily.    . Multiple Vitamins-Minerals (MULTIVITAMIN & MINERAL PO) Take 1 tablet by mouth daily.    . predniSONE (DELTASONE) 10 MG tablet Take two daily x 7days then one daily and stay (Patient taking differently: daily with breakfast. ) 60 tablet 6  . ranitidine (ZANTAC) 300 MG tablet Take 300 mg by mouth at bedtime. 1 tablet at bedtime    . rivaroxaban (XARELTO) 20 MG TABS tablet Take 1 tablet (20 mg total) by mouth daily with supper. 30 tablet 1  . sodium chloride (OCEAN) 0.65 % SOLN nasal spray Place 2 sprays into both nostrils as needed for congestion.    . verapamil (CALAN-SR) 240 MG CR tablet Take 1.5 tablets (360 mg total) by mouth daily. 45 tablet 5   No current facility-administered medications on file prior to visit.    BP 162/90 mmHg  Pulse 56  Temp(Src) 97.9 F (36.6 C) (Oral)  Resp 16  Ht 5' 5.25" (1.657 m)  Wt 113 lb 3.2 oz (51.347 kg)  BMI 18.70 kg/m2  SpO2 99%       Objective:   Physical Exam  Constitutional: She is oriented to person, place, and time. She appears well-developed and well-nourished. No distress.  Cardiovascular: Normal rate and regular rhythm.   No murmur heard. Pulmonary/Chest: She has wheezes.  Slight increased WOB (baseline for patient)  Musculoskeletal: She exhibits no edema.  Neurological: She is alert and oriented to person, place, and time.  Psychiatric: She has a normal mood and affect. Her behavior is normal. Judgment and thought content normal.          Assessment & Plan:

## 2015-03-19 NOTE — Patient Instructions (Signed)
Please complete lab work including UDS. Stop HCTZ and KDur. Start clonidine twice daily.  Follow up in 2 weeks. Go to ER if you develop worsening chest pain.

## 2015-03-19 NOTE — Assessment & Plan Note (Signed)
Repeat today

## 2015-03-19 NOTE — Progress Notes (Signed)
Pre visit review using our clinic review tool, if applicable. No additional management support is needed unless otherwise documented below in the visit note. 

## 2015-03-19 NOTE — Telephone Encounter (Signed)
Potassium is low.  I advise her to try to add potassium rich foods to her diet and repeat bmet in 2 weeks.   High potassium content foods  Highest content (>25 meq/100 g) High content (>6.2 meq/100 g)   Vegetables   Spinach   Tomatoes   Broccoli   Winter squash   Beets   Carrots   Cauliflower   Potatoes   Fruits   Bananas  Dried figs Cantaloupe  Molasses Kiwis  Seaweed Oranges  Very high content (>12.5 meq/100 g) Mangos  Dried fruits (dates, prunes) Meats  Nuts Ground beef  Avocados Steak  Bran cereals Pork  Wheat germ Veal  Lima beans Arline Asp

## 2015-03-19 NOTE — Assessment & Plan Note (Addendum)
Rx provided for tylenol #3. Controlled substance contact is signed. Obtain UDS.

## 2015-03-19 NOTE — Assessment & Plan Note (Signed)
Uncontrolled.  OK to remain off of hctz with close monitory of volume status (advised pt to call if swelling, increased sob). Add clonidine bid.  Continue current dose of verapamil.

## 2015-03-19 NOTE — Assessment & Plan Note (Signed)
OK to d/c kdur now that she is off of hctz.  Plan bmet next visit.

## 2015-03-20 ENCOUNTER — Ambulatory Visit: Payer: Medicare Other | Admitting: Family Medicine

## 2015-03-20 NOTE — Telephone Encounter (Signed)
Notified pt and she voices understanding. Pt has f/u on 04/03/15 with PCP and will repeat potassium at that time. Pt requested LFT result.  Please advise.

## 2015-03-21 ENCOUNTER — Ambulatory Visit: Payer: Medicare Other | Admitting: Family

## 2015-03-21 ENCOUNTER — Telehealth: Payer: Self-pay | Admitting: Family

## 2015-03-21 NOTE — Telephone Encounter (Signed)
Caller name: Francy Mcilvaine Relationship to patient: self Can be [phone] Pharmacy:  Reason for call: Pt starting taking clondine and feels like her bp is going too low. She said that her bp 8:10am 130/70, after pill 9:20am 99/37, now it is 106/50. Please call pt with recommendations.

## 2015-03-21 NOTE — Telephone Encounter (Signed)
Stop clonidine.  Check BP tomorrow AM and contact me with reading.

## 2015-03-21 NOTE — Telephone Encounter (Signed)
Spoke with pt. She states she started feeling a little weak about an hour after taking her BP medication. Checked BP and was 98/37, waited 10 min and was 98/47. At time of call below BP was 106/50. Pt states she still feels a little weak. Please advise.

## 2015-03-21 NOTE — Telephone Encounter (Signed)
Notified pt and she voices understanding. States pulse was 58. Advised her to call us with reading in the AM.

## 2015-03-22 ENCOUNTER — Telehealth: Payer: Self-pay | Admitting: Family

## 2015-03-22 NOTE — Telephone Encounter (Signed)
Spoke with pt. She states she has not taken verapamil or clonidine this morning and wants to know how she should proceed with medications?

## 2015-03-22 NOTE — Telephone Encounter (Signed)
OK to resume verapamil, stop clonidine. Bring home cuff to visit on 7/5 please.

## 2015-03-22 NOTE — Telephone Encounter (Signed)
Caller name: Trenise Relation to pt: Call back number: [phone] Pharmacy:  Reason for call:   Patient states that her bp is 135/83 at 7.55am and at 8.56am 124/66, 9.34am 129/69. She states that she has not taken medication as of yet and wasn't sure if she needed to

## 2015-03-22 NOTE — Telephone Encounter (Signed)
See 03/22/15 phone note.

## 2015-03-22 NOTE — Telephone Encounter (Signed)
Notified pt and she voices understanding. 

## 2015-03-25 ENCOUNTER — Telehealth: Payer: Self-pay | Admitting: Family

## 2015-03-25 NOTE — Telephone Encounter (Signed)
Potassium is low.  Please advise pt to focus on high potassium foods as below. Repeat bmet in 2 weeks dx hypokalemia.   High potassium content foods  Highest content (>25 meq/100 g) High content (>6.2 meq/100 g)   Vegetables   Spinach   Tomatoes   Broccoli   Winter squash   Beets   Carrots   Cauliflower   Potatoes   Fruits   Bananas  Dried figs Cantaloupe  Molasses Kiwis  Seaweed Oranges  Very high content (>12.5 meq/100 g) Mangos  Dried fruits (dates, prunes) Meats  Nuts Ground beef  Avocados Steak  Bran cereals Pork  Wheat germ Veal  Lima beans Arline Asp

## 2015-03-26 ENCOUNTER — Telehealth: Payer: Self-pay | Admitting: Adult Health

## 2015-03-26 NOTE — Telephone Encounter (Signed)
Patient says that the pharmacy only gave her enough medication to last her 30 days.  She said that the medication should have lasted her for 2 months, but this will only last her 30 days.  She said that she had to pay $80 for it.  Patient says that she cannot afford that.  She is aware that TP is out of the office until next week.  She has enough Brovana to last for the next 30 days, and can wait for response when TP returns.  Wants to know what she should do.  TP - please advise.

## 2015-03-26 NOTE — Telephone Encounter (Signed)
Patient informed of results and PCP instructions on foods.  Patient is scheduled with PCP next Tuesday April 03, 2015 and will discuss.

## 2015-03-29 ENCOUNTER — Ambulatory Visit (HOSPITAL_BASED_OUTPATIENT_CLINIC_OR_DEPARTMENT_OTHER): Payer: Medicare Other | Admitting: Family

## 2015-03-29 ENCOUNTER — Other Ambulatory Visit (HOSPITAL_BASED_OUTPATIENT_CLINIC_OR_DEPARTMENT_OTHER): Payer: Medicare Other

## 2015-03-29 VITALS — BP 140/66 | HR 71 | Temp 98.1°F | Resp 20 | Wt 114.0 lb

## 2015-03-29 DIAGNOSIS — Z86718 Personal history of other venous thrombosis and embolism: Secondary | ICD-10-CM

## 2015-03-29 DIAGNOSIS — I82402 Acute embolism and thrombosis of unspecified deep veins of left lower extremity: Secondary | ICD-10-CM

## 2015-03-29 DIAGNOSIS — I809 Phlebitis and thrombophlebitis of unspecified site: Secondary | ICD-10-CM

## 2015-03-29 LAB — CBC WITH DIFFERENTIAL (CANCER CENTER ONLY)
BASO#: 0 10*3/uL (ref 0.0–0.2)
BASO%: 0.4 % (ref 0.0–2.0)
EOS ABS: 0 10*3/uL (ref 0.0–0.5)
EOS%: 0.5 % (ref 0.0–7.0)
HCT: 38.8 % (ref 34.8–46.6)
HGB: 12.7 g/dL (ref 11.6–15.9)
LYMPH#: 2.1 10*3/uL (ref 0.9–3.3)
LYMPH%: 27.2 % (ref 14.0–48.0)
MCH: 23 pg — ABNORMAL LOW (ref 26.0–34.0)
MCHC: 32.7 g/dL (ref 32.0–36.0)
MCV: 70 fL — AB (ref 81–101)
MONO#: 0.5 10*3/uL (ref 0.1–0.9)
MONO%: 5.9 % (ref 0.0–13.0)
NEUT%: 66 % (ref 39.6–80.0)
NEUTROS ABS: 5.1 10*3/uL (ref 1.5–6.5)
PLATELETS: 351 10*3/uL (ref 145–400)
RBC: 5.51 10*6/uL — ABNORMAL HIGH (ref 3.70–5.32)
RDW: 16.5 % — ABNORMAL HIGH (ref 11.1–15.7)
WBC: 7.7 10*3/uL (ref 3.9–10.0)

## 2015-03-29 NOTE — Telephone Encounter (Signed)
Spoke with pt. She is using her brovana 1 vial twice daily (1/2 vial twice daily). She still can't afford this medication even after reducing how much she takes. Requesting further recs. Please advise Dr. Joya Gaskins as TP is on vacation. thanks

## 2015-03-29 NOTE — Progress Notes (Signed)
Hematology and Oncology Follow Up Visit  Holly Page [id number] 1936-01-09 79 y.o. 03/29/2015   Principle Diagnosis:  Thromboembolic disease of the right thigh Superficial thrombus of the left arm   Current Therapy:   Xarelto 20 mg by mouth daily    Interim History:  Holly Page is here today for follow-up. She is doing well and has no complaints at this time. We did do a hypercoagulable panel on her today. We will see what this shows and determine whether she needs to be on life long anticoagulation.  Her US of the left arm in June showed resolution of her superficial thrombus.  She is wearing her compression stockings as needed.  She denies fatigue, fever, chills, n/v, cough, rash, dizziness, chest pain, palpitations, abdomina pain, constipation, diarrhea, blood in urine or stool. She is SOB with exertion due to COPD and is on supplemental O2 24 hours a day now.  No swelling, tenderness, numbness or tingling in her extremities. No new aches or pains.  Her appetite is good and she is staying well hydrated. Her weight is stable.   Medications:    Medication List       This list is accurate as of: 03/29/15 10:23 AM.  Always use your most recent med list.               acetaminophen 325 MG tablet  Commonly known as:  TYLENOL  Take 2 tablets (650 mg total) by mouth every 6 (six) hours as needed.     acetaminophen-codeine 300-30 MG per tablet  Commonly known as:  TYLENOL #3  Take 1-2 tablets by mouth every 8 (eight) hours as needed for moderate pain or severe pain.     albuterol (2.5 MG/3ML) 0.083% nebulizer solution  Commonly known as:  PROVENTIL  Take 2.5 mg by nebulization every 6 (six) hours as needed for shortness of breath. And as needed     albuterol 108 (90 BASE) MCG/ACT inhaler  Commonly known as:  PROVENTIL HFA;VENTOLIN HFA  Inhale 2 puffs into the lungs every 6 (six) hours as needed for wheezing or shortness of breath.     arformoterol 15 MCG/2ML Nebu    Commonly known as:  BROVANA  1 vial daily     budesonide 0.25 MG/2ML nebulizer solution  Commonly known as:  PULMICORT  Take 2 mLs (0.25 mg total) by nebulization 2 (two) times daily. DX: J43.9     cholecalciferol 1000 UNITS tablet  Commonly known as:  VITAMIN D  Take 1,000 Units by mouth daily.     MULTIVITAMIN & MINERAL PO  Take 1 tablet by mouth daily.     predniSONE 10 MG tablet  Commonly known as:  DELTASONE  Take two daily x 7days then one daily and stay     ranitidine 300 MG tablet  Commonly known as:  ZANTAC  Take 300 mg by mouth at bedtime. 1 tablet at bedtime     rivaroxaban 20 MG Tabs tablet  Commonly known as:  XARELTO  Take 1 tablet (20 mg total) by mouth daily with supper.     sodium chloride 0.65 % Soln nasal spray  Commonly known as:  OCEAN  Place 2 sprays into both nostrils as needed for congestion.     tiZANidine 4 MG tablet  Commonly known as:  ZANAFLEX  Take 1 tablet (4 mg total) by mouth every 6 (six) hours as needed for muscle spasms.     verapamil 240 MG CR tablet  Commonly  known as:  CALAN-SR  Take 1.5 tablets (360 mg total) by mouth daily.        Allergies:  Allergies  Allergen Reactions  . Incruse Ellipta [Umeclidinium Bromide] Shortness Of Breath  . Tramadol Shortness Of Breath  . Amlodipine     Other reaction(s): SWELLING  . Aspirin     Other reaction(s): PALPITATIONS Other reaction(s): DIFFICULTY BREATHING Heart flutter  . Codeine Other (See Comments)    Hallucinations, can take Tylenol #3 now  . Doxycycline     Other reaction(s): NAUSEA,VOMITING  . Hydrocodone     hallucinations  . Losartan     unknown  . Propoxyphene Nausea Only    Other reaction(s): NAUSEA  . Tiotropium Itching and Rash    Past Medical History, Surgical history, Social history, and Family History were reviewed and updated.  Review of Systems: All other 10 point review of systems is negative.   Physical Exam:  weight is 114 lb (51.71 kg). Her  temperature is 98.1 F (36.7 C). Her blood pressure is 140/66 and her pulse is 71. Her respiration is 20 and oxygen saturation is 97%.   Wt Readings from Last 3 Encounters:  03/29/15 114 lb (51.71 kg)  03/19/15 113 lb 3.2 oz (51.347 kg)  03/15/15 113 lb (51.256 kg)    Ocular: Sclerae unicteric, pupils equal, round and reactive to light Ear-nose-throat: Oropharynx clear, dentition fair Lymphatic: No cervical or supraclavicular adenopathy Lungs no rales or rhonchi, good excursion bilaterally Heart regular rate and rhythm, no murmur appreciated Abd soft, nontender, positive bowel sounds MSK no focal spinal tenderness, no joint edema Neuro: non-focal, well-oriented, appropriate affect Breasts: Deferred  Lab Results  Component Value Date   WBC 7.7 03/29/2015   HGB 12.7 03/29/2015   HCT 38.8 03/29/2015   MCV 70* 03/29/2015   PLT 351 03/29/2015   Lab Results  Component Value Date   FERRITIN 209.1 12/19/2014   IRON 66 12/21/2014   TIBC 342 12/21/2014   UIBC 276 12/21/2014   IRONPCTSAT 19* 12/21/2014   Lab Results  Component Value Date   RBC 5.51* 03/29/2015   No results found for: KPAFRELGTCHN, LAMBDASER, KAPLAMBRATIO No results found for: IGGSERUM, IGA, IGMSERUM No results found for: Odetta Pink, SPEI   Chemistry      Component Value Date/Time   NA 140 03/19/2015 1005   K 3.4* 03/19/2015 1005   CL 99 03/19/2015 1005   CO2 34* 03/19/2015 1005   BUN 14 03/19/2015 1005   CREATININE 0.59 03/19/2015 1005   CREATININE 0.60 01/25/2015 1010      Component Value Date/Time   CALCIUM 10.0 03/19/2015 1005   ALKPHOS 63 03/19/2015 1005   AST 45* 03/19/2015 1005   ALT 22 03/19/2015 1005   BILITOT 0.5 03/19/2015 1005     Impression and Plan: Holly Page is 79 year old African-American female with history of multiple DVT's. The superficial blood clot in her left arm has now resolved. She is asymptomatic at this time.  She  is on full dose anticoagulation with Xarelto at this time. We did check a hypercoagulable panel on her today.  She will more than like ly need life long anticoagulation. We will see what this study shows.  We will see her back in 3 months for labs and follow-up.  She knows to call here with any questions or concerns. We can certainly see her sooner if need be.   Eliezer Bottom, NP 6/30/201610:23 AM

## 2015-03-30 ENCOUNTER — Ambulatory Visit: Payer: Medicare Other | Admitting: Family

## 2015-03-30 ENCOUNTER — Other Ambulatory Visit: Payer: Medicare Other

## 2015-03-30 NOTE — Telephone Encounter (Signed)
Called patient, states that she has enough medication to last her until 04/03/15.  Will send message to PW to address when he returns on Tuesday.

## 2015-04-03 ENCOUNTER — Ambulatory Visit (HOSPITAL_BASED_OUTPATIENT_CLINIC_OR_DEPARTMENT_OTHER): Payer: Medicare Other

## 2015-04-03 ENCOUNTER — Ambulatory Visit (INDEPENDENT_AMBULATORY_CARE_PROVIDER_SITE_OTHER): Payer: Medicare Other | Admitting: Family

## 2015-04-03 ENCOUNTER — Encounter: Payer: Self-pay | Admitting: Family

## 2015-04-03 VITALS — BP 130/68 | HR 72 | Temp 98.2°F | Resp 20 | Ht 65.25 in | Wt 114.2 lb

## 2015-04-03 DIAGNOSIS — I10 Essential (primary) hypertension: Secondary | ICD-10-CM | POA: Diagnosis not present

## 2015-04-03 DIAGNOSIS — R945 Abnormal results of liver function studies: Secondary | ICD-10-CM

## 2015-04-03 DIAGNOSIS — R7989 Other specified abnormal findings of blood chemistry: Secondary | ICD-10-CM | POA: Diagnosis not present

## 2015-04-03 LAB — HYPERCOAGULABLE PANEL, COMPREHENSIVE
AntiThromb III Func: 117 % (ref 76–126)
Anticardiolipin IgA: 2 APL U/mL (ref <22)
Anticardiolipin IgG: 6 GPL U/mL (ref <23)
Anticardiolipin IgM: 0 MPL U/mL (ref <11)
BETA 2 GLYCO I IGG: 3 G Units (ref <20)
BETA-2-GLYCOPROTEIN I IGM: 3 M Units (ref <20)
Beta-2-Glycoprotein I IgA: 14 A Units (ref <20)
DRVVT 1 1 MIX: 44.4 s — AB (ref <42.9)
DRVVT CONFIRMATION: 1.09 ratio (ref <1.15)
DRVVT: 50 s — AB (ref <42.9)
LUPUS ANTICOAGULANT: NOT DETECTED
PROTEIN S ACTIVITY: 135 % — AB (ref 69–129)
PROTEIN S ANTIGEN, TOTAL: 107 % (ref 70–140)
PTT Lupus Anticoagulant: 43.5 secs — ABNORMAL HIGH (ref 28.0–43.0)
PTTLA 4:1 Mix: 44.9 secs — ABNORMAL HIGH (ref 28.0–43.0)
PTTLA Confirmation: 0 secs (ref <8.0)
Protein C Activity: 200 % — ABNORMAL HIGH (ref 75–133)

## 2015-04-03 LAB — BASIC METABOLIC PANEL
BUN: 15 mg/dL (ref 6–23)
CO2: 31 meq/L (ref 19–32)
CREATININE: 0.63 mg/dL (ref 0.40–1.20)
Calcium: 9.8 mg/dL (ref 8.4–10.5)
Chloride: 102 mEq/L (ref 96–112)
GFR: 117.9 mL/min (ref >60.00)
Glucose, Bld: 94 mg/dL (ref 70–99)
Potassium: 3.2 mEq/L — ABNORMAL LOW (ref 3.5–5.1)
Sodium: 144 mEq/L (ref 135–145)

## 2015-04-03 LAB — HEPATITIS PANEL, ACUTE
HCV Ab: NEGATIVE
HEP B C IGM: NONREACTIVE
Hep A IgM: NONREACTIVE
Hepatitis B Surface Ag: NEGATIVE

## 2015-04-03 NOTE — Progress Notes (Signed)
Subjective:    Patient ID: Holly Page, female    DOB: 1936-09-16, 79 y.o.   MRN: [id number]  HPI  Holly Page is a 79 yr old female who presents today.  HTN- last visit her blood pressure was noted to be elevated. Clonidine was added to her regimen. She is taking verapamil 360mg  once daily. Did not tolerate the clonidine because her pressure dropped too low.   BP Readings from Last 3 Encounters:  04/03/15 130/68  03/29/15 140/66  03/19/15 162/90   Abnormal LFT-   She has had to LFT panels drawn in the last 4 months and on both occasions AST has been mildly elevated.   Hx of multiple DVT- maintained on xarelto and managed by hematology.     Review of Systems    see HPI  Past Medical History  Diagnosis Date  . Pneumonia   . COPD (chronic obstructive pulmonary disease)   . Hypertension   . DVT (deep venous thrombosis) 1980s, recurrent 2015    Right DVT 2015  on xarelto  . GERD (gastroesophageal reflux disease)   . Lactose intolerance   . Spinal stenosis   . Meningioma     followed by Dr Christella Noa    History   Social History  . Marital Status: Divorced    Spouse Name: N/A  . Number of Children: 4  . Years of Education: college   Occupational History  . Retired    Social History Main Topics  . Smoking status: Former Smoker -- 1.50 packs/day for 56 years    Types: Cigarettes    Quit date: 04/29/2012  . Smokeless tobacco: Never Used     Comment: quit smoking 3 years ago  . Alcohol Use: No     Comment: quit drinking beer 2005  . Drug Use: No  . Sexual Activity: Not on file   Other Topics Concern  . Not on file   Social History Narrative   Patient lives at home alone- divorced, no pets.     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 Foot surgery    . Cholecystectomy    . Tonsillectomy    . Tubal ligation      Family History  Problem Relation Age of Onset  . Lupus Sister   . COPD Father     smoker deceased.   [name] Heart attack Son     Allergies  Allergen Reactions  . Incruse Ellipta [Umeclidinium Bromide] Shortness Of Breath  . Tramadol Shortness Of Breath  . Amlodipine     Other reaction(s): SWELLING  . Aspirin     Other reaction(s): PALPITATIONS Other reaction(s): DIFFICULTY BREATHING Heart flutter  . Codeine Other (See Comments)    Hallucinations, can take Tylenol #3 now  . Doxycycline     Other reaction(s): NAUSEA,VOMITING  . Hydrocodone     hallucinations  . Losartan     unknown  . Propoxyphene Nausea Only    Other reaction(s): NAUSEA  . Tiotropium Itching and Rash    Current Outpatient Prescriptions on File Prior to Visit  Medication Sig Dispense Refill  . acetaminophen (TYLENOL) 325 MG tablet Take 2  tablets (650 mg total) by mouth every 6 (six) hours as needed. 15 tablet 0  . acetaminophen-codeine (TYLENOL #3) 300-30 MG per tablet Take 1-2 tablets by mouth every 8 (eight) hours as needed for moderate pain or severe pain. 30 tablet 0  . albuterol (PROVENTIL HFA;VENTOLIN HFA) 108 (90 BASE) MCG/ACT inhaler Inhale 2 puffs into the lungs every 6 (six) hours as needed for wheezing or shortness of breath. 1 Inhaler 3  . albuterol (PROVENTIL) (2.5 MG/3ML) 0.083% nebulizer solution Take 2.5 mg by nebulization every 6 (six) hours as needed for shortness of breath. And as needed    . arformoterol (BROVANA) 15 MCG/2ML NEBU 1 vial daily 60 mL 11  . budesonide (PULMICORT) 0.25 MG/2ML nebulizer solution Take 2 mLs (0.25 mg total) by nebulization 2 (two) times daily. DX: J43.9 120 mL 12  . cholecalciferol (VITAMIN D) 1000 UNITS tablet Take 1,000 Units by mouth daily.    . Multiple Vitamins-Minerals (MULTIVITAMIN & MINERAL PO) Take 1 tablet by mouth daily.    . predniSONE  (DELTASONE) 10 MG tablet Take two daily x 7days then one daily and stay (Patient taking differently: 10 mg daily with breakfast. ) 60 tablet 6  . rivaroxaban (XARELTO) 20 MG TABS tablet Take 1 tablet (20 mg total) by mouth daily with supper. 30 tablet 1  . sodium chloride (OCEAN) 0.65 % SOLN nasal spray Place 2 sprays into both nostrils as needed for congestion.    [name] tiZANidine (ZANAFLEX) 4 MG tablet Take 1 tablet (4 mg total) by mouth every 6 (six) hours as needed for muscle spasms. 30 tablet 0  . verapamil (CALAN-SR) 240 MG CR tablet Take 1.5 tablets (360 mg total) by mouth daily. 45 tablet 5  . ranitidine (ZANTAC) 300 MG tablet Take 300 mg by mouth at bedtime. 1 tablet at bedtime     No current facility-administered medications on file prior to visit.    BP 130/68 mmHg  Pulse 72  Temp(Src) 98.2 F (36.8 C) (Oral)  Resp 20  Ht 5' 5.25" (1.657 m)  Wt 114 lb 3.2 oz (51.801 kg)  BMI 18.87 kg/m2  SpO2 99%     Objective:   Physical Exam  Constitutional: She is oriented to person, place, and time. She appears well-developed and well-nourished.  HENT:  Head: Normocephalic and atraumatic.  Cardiovascular: Normal rate, regular rhythm and normal heart sounds.   No murmur heard. Pulmonary/Chest: Effort normal and breath sounds normal. No respiratory distress. She has no wheezes.  Neurological: She is alert and oriented to person, place, and time.  Psychiatric: She has a normal mood and affect. Her behavior is normal. Judgment and thought content normal.          Assessment & Plan:

## 2015-04-03 NOTE — Telephone Encounter (Signed)
Spoke with pt.  Pt states she is currently taking 1 vial twice daily.  Pt states the vials are only 1/2 full; this is how they were when she picked them up from the pharmacy.  Pt doesn't feel Garlon Hatchet has helped symptoms.  She feels she is more SOB now than prior to starting Clay.  OV offered to discuss.  Pt requesting appt in [place] on Thursday d/t transportation.  OV scheduled with TP on July 7 at 9:30am to discuss.  Pt aware and is aware to call back or seek emergency care if needed prior.  She verbalized understanding, is in agreement with plan, and voiced no further questions or concerns at this time.

## 2015-04-03 NOTE — Patient Instructions (Signed)
Please complete lab work prior to leaving. You will be contacted about your ultrasound.  Follow up in 3 months.

## 2015-04-03 NOTE — Telephone Encounter (Signed)
Can she afford 1/2 vial daily?   If not no other recourse but to Rx albuterol neb qid

## 2015-04-03 NOTE — Assessment & Plan Note (Signed)
Obtain hep panel and abdominal US to further evaluate.

## 2015-04-03 NOTE — Assessment & Plan Note (Signed)
BP looks ok, continue current meds.   

## 2015-04-03 NOTE — Progress Notes (Signed)
Pre visit review using our clinic review tool, if applicable. No additional management support is needed unless otherwise documented below in the visit note. 

## 2015-04-05 ENCOUNTER — Telehealth: Payer: Self-pay | Admitting: Family

## 2015-04-05 ENCOUNTER — Ambulatory Visit (HOSPITAL_BASED_OUTPATIENT_CLINIC_OR_DEPARTMENT_OTHER)
Admission: RE | Admit: 2015-04-05 | Discharge: 2015-04-05 | Disposition: A | Discharge status: Home / Self Care | Payer: Medicare Other | Source: Ambulatory Visit | Attending: Family | Admitting: Family

## 2015-04-05 ENCOUNTER — Ambulatory Visit (INDEPENDENT_AMBULATORY_CARE_PROVIDER_SITE_OTHER): Payer: Medicare Other | Admitting: Adult Health

## 2015-04-05 ENCOUNTER — Encounter: Payer: Self-pay | Admitting: Adult Health

## 2015-04-05 VITALS — BP 157/84 | HR 75 | Ht 65.25 in | Wt 112.0 lb

## 2015-04-05 DIAGNOSIS — R945 Abnormal results of liver function studies: Secondary | ICD-10-CM | POA: Diagnosis present

## 2015-04-05 DIAGNOSIS — R0902 Hypoxemia: Secondary | ICD-10-CM | POA: Diagnosis not present

## 2015-04-05 DIAGNOSIS — N281 Cyst of kidney, acquired: Secondary | ICD-10-CM | POA: Insufficient documentation

## 2015-04-05 DIAGNOSIS — J439 Emphysema, unspecified: Secondary | ICD-10-CM | POA: Diagnosis not present

## 2015-04-05 DIAGNOSIS — E876 Hypokalemia: Secondary | ICD-10-CM

## 2015-04-05 DIAGNOSIS — K7689 Other specified diseases of liver: Secondary | ICD-10-CM | POA: Diagnosis not present

## 2015-04-05 MED ORDER — IPRATROPIUM BROMIDE 0.02 % IN SOLN: 0.5000 mg | Freq: Three times a day (TID) | RESPIRATORY_TRACT | Status: DC

## 2015-04-05 MED ORDER — POTASSIUM CHLORIDE CRYS ER 20 MEQ PO TBCR: 20.0000 meq | EXTENDED_RELEASE_TABLET | Freq: Every day | ORAL | Status: DC

## 2015-04-05 NOTE — Telephone Encounter (Signed)
Also, potassium is still low.  I would like pt to start kdur 20meq two tabs by mouth today, then one tab by mouth daily. Follow up in 1 week for follow up bmet and lab work as below:

## 2015-04-05 NOTE — Telephone Encounter (Signed)
Notified pt and she voices understanding.  Lab appt scheduled for 04/12/15 at 10:15am.  Lab orders signed.

## 2015-04-05 NOTE — Patient Instructions (Addendum)
Continue on Budesonide Neb Twice daily   Change to [employer] daily  Begin Atrovent Neb Three times a day  .  Increase Oxygen 3 l/m with walking and 2 L rest /bedtime.  Use Ventolin  or Albuterol Neb As needed  -emergency inhaler.   Follow up in 1-2 months and As needed   Please contact office for sooner follow up if symptoms do not improve or worsen or seek emergency care    Late add : hold off on Atrovent as previously intolerant to anticholinergics.

## 2015-04-05 NOTE — Telephone Encounter (Signed)
Hepatitis testing is normal.  abd [place] notes + fatty liver. Advise low fat/low cholesterol diet. This finding is most likely cause for mild elevation of liver function testing.

## 2015-04-05 NOTE — Progress Notes (Signed)
Subjective:    Patient ID: Holly Page, female    DOB: 1935/09/30, 79 y.o.   MRN: [id number]  HPI  79 y.o. F with Gold D Copd primary emphysema on O2 >GOLD card patient  Hx of DVT -05/2014 (tx w/ xarelto until 12/2014 ) -followed by hematology  Left arm superficial venous thrombus , Xarelto 01/25/15 >followed by Hematology    03/01/2015 Follow up : COPD and DVT  Pt returns for 6 week follow up for COPD . Says she is doing very well on her regimen with budesonide and perforomist Was started on Incruse last ov but was not able to tolerate. She was previously intolerant to spiriva as well.  Does complain that insurance is charging her too much for perforomist now.  She was going thru DME company but became too high. Now gets nebs through pharmacy  We called and determined it will be cheaper for Brovana. She is going to try this  May have to cut brovana back to once daily as it will still cost her $ 80 month for brovana and 50-60$ for budesonide (>$100 for perforomist) We also check with [facility] pharmacy as well.  We looked at Medicare B and D filing but unable to get cheaper.  She is going to speak with her pharmacy to see if Medicare B filing would be cheaper.  She denies chest pain, orthopnea, edema , fever or increased albuterol use Last CXR 11/2014 with chronic change  PVX is utd.   She had a thrombo-embolic event in the right inner inferior thigh. This happened in September 2015. She apparently had a DVT back in the 80s. She thinks she was on blood thinner for one month. She was treated with course of Xarelto 05/2014 to 12/2014 with repeat doppler showing no DVT , chronic thickening of the vein wall . She was taken off Xarelto 12/29/14.   01/25/15 she developed left arm pain and swelling, found to have a superficial venous thrombus in the left arm. She was restarted on Xarelto with starter pack and maintenance rx. .  She says she is doing okay on Xarelto and arm pain and swelling have  resolved.   >changed to [facility]    03/15/15 Acute OV : COPD flare  Pt presents for an acute office visit Complains of 1 week of increased dyspnea, DOE , wheezing and tightness.  Got upset at pharmacy b/c they would not fill Brovana.  We called pharmacy and cost is $80 for 30 vials and $160 for 60 vials We discussed using Brovana daily instead of Twice daily  To save money  Advised if breathing declines to alert office. Finances are major issue for pt.  She says she can try to afford $80 . We switched from inhalers to nebs due to high  Of cost with inhalers in past  She has not tolerated tudorza or spiriva in past.  No fever, cough, congestion or edema or hemoptysis .  Last cxr 11/2014 with chronic changes.  Gets anxious easily . Things upset her and then she get more winded.  We discussed relaxation techniques.  On chronic prednisone 10mg  daily  >>increased pred 20mg  daily for 1 wk.   04/05/2015 Follow up : COPD  Patient returns for a follow-up for COPD. She says that she feels that her breathing is not getting any better. She has been having difficulty with insurance issues with medication cost. She has been using her Brovana once daily instead of twice daily  to help with cost savings. She is on Pulmicort nebulizer twice daily. She remains on prednisone 10 mg daily. Patient was seen with worsening shortness of breath and chest pain last month. She had a CT chest, on June 20 that showed no evidence of pulmonary embolism  and stable. Severe COPD changes. We reviewed her CT results. Last spirometry showed FEV1 at 30% in 2015.  She has been tried on [place] but was unable to tolerate. She denies any chest pain, palpitations, orthopnea, edema, fever or discolored mucus. Oxygen does drop with pulsing O2 on 2 L was able to keep above 90% on pulsing 3 L. Patient says she has been evaluated by cardiology with a negative stress test recently.      Review of  Systems  Constitutional:   No  weight loss, night sweats,  Fevers, chills, + fatigue, or  lassitude.  HEENT:   No headaches,  Difficulty swallowing,  Tooth/dental problems, or  Sore throat,                No sneezing, itching, ear ache, nasal congestion, post nasal drip,   CV:  No chest pain,  Orthopnea, PND, swelling in lower extremities, anasarca, dizziness, palpitations, syncope.   GI  Notes heartburn, no indigestion, abdominal pain, nausea, vomiting, diarrhea, change in bowel habits, loss of appetite, bloody stools.   Resp:  .    No chest wall deformity  Skin: no rash or lesions.  GU: no dysuria, change in color of urine, no urgency or frequency.  No flank pain, no hematuria   MS:  No joint pain or swelling.  No decreased range of motion.  No back pain.  Psych:  No change in mood or affect.+anxiety.  No memory loss.      Objective:   Physical Exam  GEN: A/Ox3; pleasant , NAD, elderly and frail   HEENT:  [place]/AT,  EACs-clear, TMs-wnl, NOSE-clear, THROAT-clear, no lesions  NECK:  Supple w/ fair ROM; no JVD; normal carotid impulses w/o bruits; no thyromegaly or nodules palpated; no lymphadenopathy.  RESP  Diminished in bases w/ no wheezing or rhonchi.no accessory muscle use, no dullness to percussion  CARD:  RRR, no m/r/g  , no peripheral edema, pulses intact, no cyanosis or clubbing.  GI:   Soft & nt; nml bowel sounds; no organomegaly or masses detected.  Musco: Warm bil, no deformities or joint swelling noted.   Neuro: alert, no focal deficits noted.    Skin: Warm, no lesions or rashes .       Assessment & Plan:

## 2015-04-09 NOTE — Assessment & Plan Note (Signed)
Severe COPD  Patient is been tried on many inhalers. Has either been and able to tolerate or unable to afford. We discussed adding Atrovent to her current regimen as she's been on this in the past. However, due to her intolerances to Spiriva and other anticholinergic inhalers. We'll hold off at this time. We'll increase her oxygen to 3 L with walking and 2 L at rest.  Plan  Continue on Budesonide Neb Twice daily   Change to [employer] daily   Increase Oxygen 3 l/m with walking and 2 L rest /bedtime.  Use Ventolin  or Albuterol Neb As needed  -emergency inhaler.   Follow up in 1-2 months and As needed   Please contact office for sooner follow up if symptoms do not improve or worsen or seek emergency care

## 2015-04-09 NOTE — Assessment & Plan Note (Signed)
Increase Oxygen 3 l/m with walking and 2 L rest /bedtime.   Follow up in 1-2 months and As needed   Please contact office for sooner follow up if symptoms do not improve or worsen or seek emergency care

## 2015-04-10 ENCOUNTER — Telehealth: Payer: Self-pay | Admitting: Adult Health

## 2015-04-10 DIAGNOSIS — R0600 Dyspnea, unspecified: Secondary | ICD-10-CM

## 2015-04-10 DIAGNOSIS — R0789 Other chest pain: Secondary | ICD-10-CM

## 2015-04-10 NOTE — Telephone Encounter (Signed)
Can we refer her to [place] cards  For dyspnea/chest tightness  Please contact office for sooner follow up if symptoms do not improve or worsen or seek emergency care

## 2015-04-10 NOTE — Telephone Encounter (Signed)
Called spoke with pt. She saw TP 04/05/15 and reports she mentioned to her about seeing a heart doc. She wants to know if TP can refer her to one that goes to [place] or in [place]. Please advise thanks

## 2015-04-10 NOTE — Telephone Encounter (Signed)
Called and spoke to pt. Pt stated she is unsure of the provider she saw but they were with [facility].   TP please advise. Thanks.

## 2015-04-10 NOTE — Telephone Encounter (Signed)
Called and spoke to pt. Informed her of the referral. Order placed. Pt verbalized understanding and denied any further questions or concerns at this time.

## 2015-04-10 NOTE — Telephone Encounter (Signed)
Pt says she was seen by cardiology in [facility] , does she remember who she seen previously?

## 2015-04-12 ENCOUNTER — Telehealth: Payer: Self-pay | Admitting: Family

## 2015-04-12 ENCOUNTER — Encounter (HOSPITAL_BASED_OUTPATIENT_CLINIC_OR_DEPARTMENT_OTHER): Payer: Self-pay | Admitting: *Deleted

## 2015-04-12 ENCOUNTER — Other Ambulatory Visit: Payer: Medicare Other

## 2015-04-12 ENCOUNTER — Emergency Department (HOSPITAL_BASED_OUTPATIENT_CLINIC_OR_DEPARTMENT_OTHER)
Admission: EM | Admit: 2015-04-12 | Discharge: 2015-04-12 | Disposition: A | Discharge status: Home / Self Care | Payer: Medicare Other | Attending: Emergency Medicine | Admitting: Emergency Medicine

## 2015-04-12 ENCOUNTER — Emergency Department (HOSPITAL_BASED_OUTPATIENT_CLINIC_OR_DEPARTMENT_OTHER): Payer: Medicare Other

## 2015-04-12 DIAGNOSIS — Z87891 Personal history of nicotine dependence: Secondary | ICD-10-CM | POA: Diagnosis not present

## 2015-04-12 DIAGNOSIS — K219 Gastro-esophageal reflux disease without esophagitis: Secondary | ICD-10-CM | POA: Insufficient documentation

## 2015-04-12 DIAGNOSIS — R0602 Shortness of breath: Secondary | ICD-10-CM | POA: Diagnosis present

## 2015-04-12 DIAGNOSIS — J449 Chronic obstructive pulmonary disease, unspecified: Secondary | ICD-10-CM

## 2015-04-12 DIAGNOSIS — J441 Chronic obstructive pulmonary disease with (acute) exacerbation: Secondary | ICD-10-CM | POA: Insufficient documentation

## 2015-04-12 DIAGNOSIS — Z7952 Long term (current) use of systemic steroids: Secondary | ICD-10-CM | POA: Diagnosis not present

## 2015-04-12 DIAGNOSIS — Z86018 Personal history of other benign neoplasm: Secondary | ICD-10-CM | POA: Diagnosis not present

## 2015-04-12 DIAGNOSIS — Z8701 Personal history of pneumonia (recurrent): Secondary | ICD-10-CM | POA: Insufficient documentation

## 2015-04-12 DIAGNOSIS — Z79899 Other long term (current) drug therapy: Secondary | ICD-10-CM | POA: Diagnosis not present

## 2015-04-12 DIAGNOSIS — I10 Essential (primary) hypertension: Secondary | ICD-10-CM | POA: Insufficient documentation

## 2015-04-12 DIAGNOSIS — Z86718 Personal history of other venous thrombosis and embolism: Secondary | ICD-10-CM | POA: Insufficient documentation

## 2015-04-12 DIAGNOSIS — Z7901 Long term (current) use of anticoagulants: Secondary | ICD-10-CM | POA: Insufficient documentation

## 2015-04-12 DIAGNOSIS — Z8739 Personal history of other diseases of the musculoskeletal system and connective tissue: Secondary | ICD-10-CM | POA: Insufficient documentation

## 2015-04-12 DIAGNOSIS — Z8639 Personal history of other endocrine, nutritional and metabolic disease: Secondary | ICD-10-CM | POA: Insufficient documentation

## 2015-04-12 LAB — CBC
HCT: 40 % (ref 36.0–46.0)
Hemoglobin: 12.4 g/dL (ref 12.0–15.0)
MCH: 22.3 pg — ABNORMAL LOW (ref 26.0–34.0)
MCHC: 31 g/dL (ref 30.0–36.0)
MCV: 71.8 fL — AB (ref 78.0–100.0)
Platelets: 275 10*3/uL (ref 150–400)
RBC: 5.57 MIL/uL — AB (ref 3.87–5.11)
RDW: 18.4 % — AB (ref 11.5–15.5)
WBC: 10.2 10*3/uL (ref 4.0–10.5)

## 2015-04-12 LAB — BRAIN NATRIURETIC PEPTIDE: B Natriuretic Peptide: 24.5 pg/mL (ref 0.0–100.0)

## 2015-04-12 LAB — BASIC METABOLIC PANEL
Anion gap: 9 (ref 5–15)
BUN: 13 mg/dL (ref 6–20)
CALCIUM: 9.5 mg/dL (ref 8.9–10.3)
CO2: 35 mmol/L — ABNORMAL HIGH (ref 22–32)
Chloride: 101 mmol/L (ref 101–111)
Creatinine, Ser: 0.52 mg/dL (ref 0.44–1.00)
GFR calc Af Amer: >60 mL/min (ref >60)
GFR calc non Af Amer: >60 mL/min (ref >60)
Glucose, Bld: 100 mg/dL — ABNORMAL HIGH (ref 65–99)
POTASSIUM: 3.7 mmol/L (ref 3.5–5.1)
Sodium: 145 mmol/L (ref 135–145)

## 2015-04-12 LAB — TROPONIN I: Troponin I: <0.03 ng/mL (ref <0.031)

## 2015-04-12 MED ORDER — ALBUTEROL SULFATE (2.5 MG/3ML) 0.083% IN NEBU: 2.5000 mg | INHALATION_SOLUTION | RESPIRATORY_TRACT | Status: DC | PRN

## 2015-04-12 MED ORDER — ALBUTEROL SULFATE (2.5 MG/3ML) 0.083% IN NEBU
2.5000 mg | INHALATION_SOLUTION | Freq: Once | RESPIRATORY_TRACT | Status: AC
  Administered 2015-04-12: 2.5 mg via RESPIRATORY_TRACT
  Filled 2015-04-12: qty 3

## 2015-04-12 NOTE — ED Notes (Signed)
Patient states has been on a new COPD medication, Brovana, for the last two months.  States while taking this medication, she has progressively been more sob.  Was seen by [facility] one week ago and was told to continue to take the medication.  Patient states she stopped the [place] herself four days ago.  States she also has had an "aching" in her left chest and left arm, chronically, which has been worse over the last few weeks.

## 2015-04-12 NOTE — ED Provider Notes (Signed)
CSN: [id number]     Arrival date & time 04/12/15  9163 History   First MD Initiated Contact with Patient 04/12/15 0818     Chief Complaint  Patient presents with  . Shortness of Breath     (Consider location/radiation/quality/duration/timing/severity/associated sxs/prior Treatment) Patient is a 79 y.o. female presenting with shortness of breath.  Shortness of Breath Severity:  Moderate Onset quality:  Gradual Duration:  2 weeks Timing:  Constant Progression:  Unchanged Chronicity:  Chronic Context: not URI   Relieved by:  Nothing Worsened by:  Nothing tried Associated symptoms: no abdominal pain, no chest pain, no cough, no fever and no vomiting     Past Medical History  Diagnosis Date  . Pneumonia   . COPD (chronic obstructive pulmonary disease)   . Hypertension   . DVT (deep venous thrombosis) 1980s, recurrent 2015    Right DVT 2015  on xarelto  . GERD (gastroesophageal reflux disease)   . Lactose intolerance   . Spinal stenosis   . Meningioma     followed by Dr Christella Noa   Past Surgical History  Procedure Laterality Date  . Foot surgery    . Cholecystectomy    . Tonsillectomy    . Tubal ligation     Family History  Problem Relation Age of Onset  . Lupus Sister   . COPD Father     smoker deceased.   [name] Heart attack Son    History  Substance Use Topics  . Smoking status: Former Smoker -- 1.50 packs/day for 56 years    Types: Cigarettes    Quit date: 04/29/2012  . Smokeless tobacco: Never Used     Comment: quit smoking 3 years ago  . Alcohol Use: No     Comment: quit drinking beer 2005   OB History    No data available     Review of Systems  Constitutional: Negative for fever.  Respiratory: Negative for cough and shortness of breath.   Cardiovascular: Negative for chest pain and leg swelling.  Gastrointestinal: Negative for vomiting and abdominal pain.  All other systems reviewed and are negative.     Allergies  Incruse ellipta; Tramadol;  Amlodipine; Aspirin; Codeine; Doxycycline; Hydrocodone; Losartan; Propoxyphene; and Tiotropium  Home Medications   Prior to Admission medications   Medication Sig Start Date End Date Taking? Authorizing Provider  acetaminophen (TYLENOL) 325 MG tablet Take 2 tablets (650 mg total) by mouth every 6 (six) hours as needed. 01/19/14   Carmin Muskrat, MD  acetaminophen-codeine (TYLENOL #3) 300-30 MG per tablet Take 1-2 tablets by mouth every 8 (eight) hours as needed for moderate pain or severe pain. 03/19/15   Debbrah Alar, NP  albuterol (PROVENTIL HFA;VENTOLIN HFA) 108 (90 BASE) MCG/ACT inhaler Inhale 2 puffs into the lungs every 6 (six) hours as needed for wheezing or shortness of breath. 01/25/15   Elsie Stain, MD  albuterol (PROVENTIL) (2.5 MG/3ML) 0.083% nebulizer solution Take 2.5 mg by nebulization every 6 (six) hours as needed for shortness of breath. And as needed 11/28/13   Elsie Stain, MD  arformoterol [facility]) 15 MCG/2ML NEBU 1 vial daily 03/15/15   Tammy S Parrett, NP  budesonide (PULMICORT) 0.25 MG/2ML nebulizer solution Take 2 mLs (0.25 mg total) by nebulization 2 (two) times daily. DX: J43.9 02/12/15   Elsie Stain, MD  cholecalciferol (VITAMIN D) 1000 UNITS tablet Take 1,000 Units by mouth daily.    Historical Provider, MD  ipratropium (ATROVENT) 0.02 % nebulizer solution Take 2.5 mLs (  0.5 mg total) by nebulization 3 (three) times daily. 04/05/15   Tammy S Parrett, NP  Multiple Vitamins-Minerals (MULTIVITAMIN & MINERAL PO) Take 1 tablet by mouth daily.    Historical Provider, MD  potassium chloride SA (K-DUR,KLOR-CON) 20 MEQ tablet Take 1 tablet (20 mEq total) by mouth daily. 04/05/15   Debbrah Alar, NP  predniSONE (DELTASONE) 10 MG tablet Take two daily x 7days then one daily and stay Patient taking differently: 10 mg daily with breakfast.  01/25/15   Elsie Stain, MD  ranitidine (ZANTAC) 300 MG tablet Take 300 mg by mouth at bedtime. 1 tablet at bedtime 03/20/14  03/20/15  Historical Provider, MD  rivaroxaban (XARELTO) 20 MG TABS tablet Take 1 tablet (20 mg total) by mouth daily with supper. 01/29/15   Eliezer Bottom, NP  sodium chloride (OCEAN) 0.65 % SOLN nasal spray Place 2 sprays into both nostrils as needed for congestion.    Historical Provider, MD  tiZANidine (ZANAFLEX) 4 MG tablet Take 1 tablet (4 mg total) by mouth every 6 (six) hours as needed for muscle spasms. 03/19/15   Debbrah Alar, NP  verapamil (CALAN-SR) 240 MG CR tablet Take 1.5 tablets (360 mg total) by mouth daily. 03/12/15   Colon Branch, MD   BP 152/77 mmHg  Pulse 68  Temp(Src) 98.1 F (36.7 C) (Oral)  Resp 26  Ht 5\' 5"  (1.651 m)  Wt 112 lb (50.803 kg)  BMI 18.64 kg/m2  SpO2 100% Physical Exam  Constitutional: She is oriented to person, place, and time. She appears well-developed and well-nourished. No distress.  HENT:  Head: Normocephalic and atraumatic.  Mouth/Throat: Oropharynx is clear and moist.  Eyes: EOM are normal. Pupils are equal, round, and reactive to light.  Neck: Normal range of motion. Neck supple.  Cardiovascular: Normal rate and regular rhythm.  Exam reveals no friction rub.   No murmur heard. Pulmonary/Chest: Effort normal and breath sounds normal. No respiratory distress. She has no wheezes. She has no rales.  Abdominal: Soft. She exhibits no distension. There is no tenderness. There is no rebound.  Musculoskeletal: Normal range of motion. She exhibits no edema.  Neurological: She is alert and oriented to person, place, and time.  Skin: No rash noted. She is not diaphoretic.  Nursing note and vitals reviewed.   ED Course  Procedures (including critical care time) Labs Review Labs Reviewed  CBC  BASIC METABOLIC PANEL  TROPONIN I  BRAIN NATRIURETIC PEPTIDE    Imaging Review Dg Chest 2 View  04/12/2015   CLINICAL DATA:  Two months of left lower chest discomfort with increasing shortness of breath, on home oxygen for COPD  EXAM: CHEST  2 VIEW   COMPARISON:  Chest x-ray of December 14, 2014 and chest CT scan of March 19, 2015  FINDINGS: The lungs remain hyperinflated with hemidiaphragm flattening. There is no focal infiltrate. The interstitial markings are chronically increased in the right infrahilar region. The heart and pulmonary vascularity are normal. There is no pleural effusion or pneumothorax. There is mild tortuosity of the descending thoracic aorta. There is partially image levocurvature of the lumbar spine.  IMPRESSION: COPD. There is no pneumonia, CHF, nor other acute cardiopulmonary abnormality.   Electronically Signed   By: David  Martinique M.D.   On: 04/12/2015 09:19     EKG Interpretation   Date/Time:  Thursday April 12 2015 08:32:34 EDT Ventricular Rate:  69 PR Interval:  174 QRS Duration: 84 QT Interval:  350 QTC Calculation: 375 R  Axis:   83 Text Interpretation:  Normal sinus rhythm ST \\T \ T wave abnormality,  consider inferior ischemia Abnormal ECG No significant change since last  tracing Confirmed by Mingo Amber  MD, Jumar Greenstreet ([id number]) on 04/12/2015 8:33:48 AM      MDM   Final diagnoses:  Chronic obstructive pulmonary disease, unspecified COPD, unspecified chronic bronchitis type  Shortness of breath    79 year old female history of COPD presents with shortness of breath. Present for the past couple months. She was started on a medicine called Brovana by her pulmonologist, however she has not felt any improvement with taking. The new medicine is a long-acting 2 agonist. She stopped this medication 4 days ago and states continued shortness of breath. She denies any worsening cough or increased productivity of the cough. She denies any fever. She also complains of some chest aching on the left side which is been going on for a long time. Here she is well appearing, no respiratory distress, she is speaking in full sentences. She had diminished lung sounds diffusely. Will plan on labs and chest x-ray. Albuterol treatment  given.  She's feeling better after albuterol, given Rx for nebs. I spoke with her PCP who can f/u with her next week and get her back into her Pulmonologist's office soon.    Evelina Bucy, MD 04/12/15 [phone]

## 2015-04-12 NOTE — Telephone Encounter (Signed)
Received call from ED physician re: Holly Page, states that pt has c/o worsening sob but is clinically stable. He states that pt attributes worsening in her sob to change of meds to brovana.  He asked that I contact you to let you know and to arrange follow up with pulmonary please.   Thanks!

## 2015-04-12 NOTE — Telephone Encounter (Signed)
Dr. Elsworth Soho, where can I add her in at on your schedule? You are double booked most days in [place] office, you are doubled booked in [facility] office on Thursday and no clinic in [facility] office for August. Please advise.

## 2015-04-12 NOTE — Discharge Instructions (Signed)

## 2015-04-12 NOTE — Telephone Encounter (Signed)
I will get her in with Dr. Elsworth Soho  For follow up .  We have tried all kinds of different things for her . She can not afford her medications.  We called the DME , several different pharmacy including [facility] to see if we can get meds cheaper.  Looked at pt assistance programs, given samples .  Had to  cut Brovana  back to once daily to save money.  Insurance will not cover perforomist . Inhalers are too expensive with her plan   She has had repeat CT chest with neg PE , CXR w / no acute process.  Tried to add atrovent neb but she says she is allergic to anticholinergics.  Only other think is to add albuterol Four times a day  Along with brovana. And budesonide. Or start on low dose steroids .    .  I have suggested she go to Cardiology again to make sure not cardiac related.  She did tell me she had a recent stress test that was neg from [place] [facility] in [place] BNP is low ,does not appear to be CHF systolic or diastolic as she never has edema .  No anemia on cbc  tsh is ok   She has very severe COPD with FEV1 at 30% which accounts for a lot of her dyspnea.  I adjusted her O2 from last ov .

## 2015-04-12 NOTE — Telephone Encounter (Signed)
Thank you for your help with her!!

## 2015-04-13 NOTE — Telephone Encounter (Signed)
Patient notified.  Nothing further needed. 

## 2015-04-13 NOTE — Telephone Encounter (Signed)
Dc brovana Use albuterol 4 times/day

## 2015-04-14 ENCOUNTER — Other Ambulatory Visit: Payer: Self-pay | Admitting: Family

## 2015-04-16 ENCOUNTER — Ambulatory Visit (INDEPENDENT_AMBULATORY_CARE_PROVIDER_SITE_OTHER): Payer: Medicare Other | Admitting: Family

## 2015-04-16 ENCOUNTER — Encounter: Payer: Self-pay | Admitting: Family

## 2015-04-16 ENCOUNTER — Other Ambulatory Visit: Payer: Self-pay

## 2015-04-16 VITALS — BP 120/80 | HR 65 | Temp 97.9°F | Resp 16 | Ht 65.25 in | Wt 112.6 lb

## 2015-04-16 DIAGNOSIS — J439 Emphysema, unspecified: Secondary | ICD-10-CM

## 2015-04-16 MED ORDER — RIVAROXABAN 20 MG PO TABS: ORAL_TABLET | ORAL | Status: DC

## 2015-04-16 NOTE — Progress Notes (Signed)
Subjective:    Patient ID: Holly Page, female    DOB: [birth year], 79 y.o.   MRN: [id number]  HPI  [name] is a 79 yr old female who presents today for ED follow up of her COPD symptoms. . ED records are reviewed.  She is followed by pulmonology and apparently has been struggling to afford her medications. She stopped her Desiree Hane because it was not helping and she was only able to afford a once a day dosing scheduled prior to d/c.  Pulmonology recommended albuterol quid.     Review of Systems See HPI  Past Medical History  Diagnosis Date  . Pneumonia   . COPD (chronic obstructive pulmonary disease)   . Hypertension   . DVT (deep venous thrombosis) 1980s, recurrent 2015    Right DVT 2015  on xarelto  . GERD (gastroesophageal reflux disease)   . Lactose intolerance   . Spinal stenosis   . Meningioma     followed by Dr Christella Noa    History   Social History  . Marital Status: Divorced    Spouse Name: N/A  . Number of Children: 4  . Years of Education: college   Occupational History  . Retired    Social History Main Topics  . Smoking status: Former Smoker -- 1.50 packs/day for 56 years    Types: Cigarettes    Quit date: 04/29/2012  . Smokeless tobacco: Never Used     Comment: quit smoking 3 years ago  . Alcohol Use: No     Comment: quit drinking beer 2005  . Drug Use: No  . Sexual Activity: Not on file   Other Topics Concern  . Not on file   Social History Narrative   Patient lives at home alone- divorced, no pets.     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 Foot surgery    . Cholecystectomy    . Tonsillectomy    . Tubal ligation      Family History  Problem Relation Age of Onset  . Lupus Sister   . COPD  Father     smoker deceased.   [name] Heart attack Son     Allergies  Allergen Reactions  . Incruse Ellipta [Umeclidinium Bromide] Shortness Of Breath  . Tramadol Shortness Of Breath  . Amlodipine     Other reaction(s): SWELLING  . Aspirin     Other reaction(s): PALPITATIONS Other reaction(s): DIFFICULTY BREATHING Heart flutter  . Codeine Other (See Comments)    Hallucinations, can take Tylenol #3 now  . Doxycycline     Other reaction(s): NAUSEA,VOMITING  . Hydrocodone     hallucinations  . Losartan     unknown  . Propoxyphene Nausea Only    Other reaction(s): NAUSEA  . Tiotropium Itching and Rash    Current Outpatient Prescriptions on File Prior to Visit  Medication Sig Dispense Refill  . acetaminophen (TYLENOL) 325 MG tablet Take 2 tablets (650 mg total) by mouth every 6 (six) hours as needed. 15 tablet 0  . acetaminophen-codeine (TYLENOL #3) 300-30 MG per tablet Take 1-2 tablets by mouth every 8 (eight) hours as needed for moderate pain or  severe pain. 30 tablet 0  . albuterol (PROVENTIL HFA;VENTOLIN HFA) 108 (90 BASE) MCG/ACT inhaler Inhale 2 puffs into the lungs every 6 (six) hours as needed for wheezing or shortness of breath. 1 Inhaler 3  . albuterol (PROVENTIL) (2.5 MG/3ML) 0.083% nebulizer solution Take 2.5 mg by nebulization every 6 (six) hours as needed for shortness of breath. And as needed    . budesonide (PULMICORT) 0.25 MG/2ML nebulizer solution Take 2 mLs (0.25 mg total) by nebulization 2 (two) times daily. DX: J43.9 120 mL 12  . cholecalciferol (VITAMIN D) 1000 UNITS tablet Take 1,000 Units by mouth daily.    [name] ipratropium (ATROVENT) 0.02 % nebulizer solution Take 2.5 mLs (0.5 mg total) by nebulization 3 (three) times daily. 75 mL 3  . Multiple Vitamins-Minerals (MULTIVITAMIN & MINERAL PO) Take 1 tablet by mouth daily.    . potassium chloride SA (K-DUR,KLOR-CON) 20 MEQ tablet Take 1 tablet (20 mEq total) by mouth daily. 30 tablet 1  . predniSONE (DELTASONE) 10 MG  tablet Take two daily x 7days then one daily and stay (Patient taking differently: 10 mg daily with breakfast. ) 60 tablet 6  . sodium chloride (OCEAN) 0.65 % SOLN nasal spray Place 2 sprays into both nostrils as needed for congestion.    . verapamil (CALAN-SR) 240 MG CR tablet Take 1.5 tablets (360 mg total) by mouth daily. 45 tablet 5  . XARELTO 20 MG TABS tablet TAKE 1 TABLET BY MOUTH EVERY DAY WITH SUPPER 30 tablet 1  . ranitidine (ZANTAC) 300 MG tablet Take 300 mg by mouth at bedtime. 1 tablet at bedtime     No current facility-administered medications on file prior to visit.    BP 120/80 mmHg  Pulse 65  Temp(Src) 97.9 F (36.6 C) (Oral)  Resp 16  Ht 5' 5.25" (1.657 m)  Wt 112 lb 9.6 oz (51.075 kg)  BMI 18.60 kg/m2  SpO2 95%       Objective:   Physical Exam  Constitutional: She appears well-developed and well-nourished.  Cardiovascular: Normal rate, regular rhythm and normal heart sounds.   No murmur heard. Pulmonary/Chest: Effort normal and breath sounds normal. No respiratory distress. She has no wheezes.  Diminished breath sounds throughout.   Psychiatric: She has a normal mood and affect. Her behavior is normal. Judgment and thought content normal.          Assessment & Plan:

## 2015-04-16 NOTE — Patient Instructions (Addendum)
Continue albuterol every 6 hours. Work on low fat/low cholesterol diet. Follow up in 3 months.   Fat and Cholesterol Control Diet Fat and cholesterol levels in your blood and organs are influenced by your diet. High levels of fat and cholesterol may lead to diseases of the heart, small and large blood vessels, gallbladder, liver, and pancreas. CONTROLLING FAT AND CHOLESTEROL WITH DIET Although exercise and lifestyle factors are important, your diet is key. That is because certain foods are known to raise cholesterol and others to lower it. The goal is to balance foods for their effect on cholesterol and more importantly, to replace saturated and trans fat with other types of fat, such as monounsaturated fat, polyunsaturated fat, and omega-3 fatty acids. On average, a person should consume no more than 15 to 17 g of saturated fat daily. Saturated and trans fats are considered "bad" fats, and they will raise LDL cholesterol. Saturated fats are primarily found in animal products such as meats, butter, and cream. However, that does not mean you need to give up all your favorite foods. Today, there are good tasting, low-fat, low-cholesterol substitutes for most of the things you like to eat. Choose low-fat or nonfat alternatives. Choose round or loin cuts of red meat. These types of cuts are lowest in fat and cholesterol. Chicken (without the skin), fish, veal, and ground [place] breast are great choices. Eliminate fatty meats, such as hot dogs and salami. Even shellfish have little or no saturated fat. Have a 3 oz (85 g) portion when you eat lean meat, poultry, or fish. Trans fats are also called "partially hydrogenated oils." They are oils that have been scientifically manipulated so that they are solid at room temperature resulting in a longer shelf life and improved taste and texture of foods in which they are added. Trans fats are found in stick margarine, some tub margarines, cookies, crackers, and baked  goods.  When baking and cooking, oils are a great substitute for butter. The monounsaturated oils are especially beneficial since it is believed they lower LDL and raise HDL. The oils you should avoid entirely are saturated tropical oils, such as coconut and palm.  Remember to eat a lot from food groups that are naturally free of saturated and trans fat, including fish, fruit, vegetables, beans, grains (barley, rice, couscous, bulgur wheat), and pasta (without cream sauces).  IDENTIFYING FOODS THAT LOWER FAT AND CHOLESTEROL  Soluble fiber may lower your cholesterol. This type of fiber is found in fruits such as apples, vegetables such as broccoli, potatoes, and carrots, legumes such as beans, peas, and lentils, and grains such as barley. Foods fortified with plant sterols (phytosterol) may also lower cholesterol. You should eat at least 2 g per day of these foods for a cholesterol lowering effect.  Read package labels to identify low-saturated fats, trans fat free, and low-fat foods at the supermarket. Select cheeses that have only 2 to 3 g saturated fat per ounce. Use a heart-healthy tub margarine that is free of trans fats or partially hydrogenated oil. When buying baked goods (cookies, crackers), avoid partially hydrogenated oils. Breads and muffins should be made from whole grains (whole-wheat or whole oat flour, instead of "flour" or "enriched flour"). Buy non-creamy canned soups with reduced salt and no added fats.  FOOD PREPARATION TECHNIQUES  Never deep-fry. If you must fry, either stir-fry, which uses very little fat, or use non-stick cooking sprays. When possible, broil, bake, or roast meats, and steam vegetables. Instead of putting butter  or margarine on vegetables, use lemon and herbs, applesauce, and cinnamon (for squash and sweet potatoes). Use nonfat yogurt, salsa, and low-fat dressings for salads.  LOW-SATURATED FAT / LOW-FAT FOOD SUBSTITUTES Meats / Saturated Fat (g)  Avoid: Steak,  marbled (3 oz/85 g) / 11 g  Choose: Steak, lean (3 oz/85 g) / 4 g  Avoid: Hamburger (3 oz/85 g) / 7 g  Choose: Hamburger, lean (3 oz/85 g) / 5 g  Avoid: Ham (3 oz/85 g) / 6 g  Choose: Ham, lean cut (3 oz/85 g) / 2.4 g  Avoid: Chicken, with skin, dark meat (3 oz/85 g) / 4 g  Choose: Chicken, skin removed, dark meat (3 oz/85 g) / 2 g  Avoid: Chicken, with skin, light meat (3 oz/85 g) / 2.5 g  Choose: Chicken, skin removed, light meat (3 oz/85 g) / 1 g Dairy / Saturated Fat (g)  Avoid: Whole milk (1 cup) / 5 g  Choose: Low-fat milk, 2% (1 cup) / 3 g  Choose: Low-fat milk, 1% (1 cup) / 1.5 g  Choose: Skim milk (1 cup) / 0.3 g  Avoid: Hard cheese (1 oz/28 g) / 6 g  Choose: Skim milk cheese (1 oz/28 g) / 2 to 3 g  Avoid: Cottage cheese, 4% fat (1 cup) / 6.5 g  Choose: Low-fat cottage cheese, 1% fat (1 cup) / 1.5 g  Avoid: Ice cream (1 cup) / 9 g  Choose: Sherbet (1 cup) / 2.5 g  Choose: Nonfat frozen yogurt (1 cup) / 0.3 g  Choose: Frozen fruit bar / trace  Avoid: Whipped cream (1 tbs) / 3.5 g  Choose: Nondairy whipped topping (1 tbs) / 1 g Condiments / Saturated Fat (g)  Avoid: Mayonnaise (1 tbs) / 2 g  Choose: Low-fat mayonnaise (1 tbs) / 1 g  Avoid: Butter (1 tbs) / 7 g  Choose: Extra light margarine (1 tbs) / 1 g  Avoid: Coconut oil (1 tbs) / 11.8 g  Choose: Olive oil (1 tbs) / 1.8 g  Choose: Corn oil (1 tbs) / 1.7 g  Choose: Safflower oil (1 tbs) / 1.2 g  Choose: Sunflower oil (1 tbs) / 1.4 g  Choose: Soybean oil (1 tbs) / 2.4 g  Choose: Canola oil (1 tbs) / 1 g Document Released: 09/15/2005 Document Revised: 01/10/2013 Document Reviewed: 12/14/2013 ExitCare Patient Information 2015 [place], [place]. This information is not intended to replace advice given to you by your health care provider. Make sure you discuss any questions you have with your health care provider.

## 2015-04-16 NOTE — Progress Notes (Signed)
Pre visit review using our clinic review tool, if applicable. No additional management support is needed unless otherwise documented below in the visit note. 

## 2015-04-19 ENCOUNTER — Encounter: Payer: Self-pay | Admitting: Adult Health

## 2015-04-19 ENCOUNTER — Ambulatory Visit (INDEPENDENT_AMBULATORY_CARE_PROVIDER_SITE_OTHER): Payer: Medicare Other | Admitting: Adult Health

## 2015-04-19 VITALS — BP 122/70 | HR 79 | Ht 65.0 in | Wt 112.0 lb

## 2015-04-19 DIAGNOSIS — J439 Emphysema, unspecified: Secondary | ICD-10-CM | POA: Diagnosis not present

## 2015-04-19 DIAGNOSIS — R0789 Other chest pain: Secondary | ICD-10-CM | POA: Diagnosis not present

## 2015-04-19 DIAGNOSIS — J9611 Chronic respiratory failure with hypoxia: Secondary | ICD-10-CM

## 2015-04-19 DIAGNOSIS — J9621 Acute and chronic respiratory failure with hypoxia: Secondary | ICD-10-CM | POA: Insufficient documentation

## 2015-04-19 NOTE — Assessment & Plan Note (Signed)
Continue on Oxygen 3l/m with activity and 2l/m at rest .  Follow with Dr. Elsworth Soho  In 6 weeks and As needed   Please contact office for sooner follow up if symptoms do not improve or worsen or seek emergency care

## 2015-04-19 NOTE — Progress Notes (Signed)
Reviewed & agree with plan  

## 2015-04-19 NOTE — Assessment & Plan Note (Signed)
Follow up with Cardiology as planned.  Follow up for stress test next week.  Follow with Dr. Elsworth Soho  In 6 weeks and As needed   Please contact office for sooner follow up if symptoms do not improve or worsen or seek emergency care

## 2015-04-19 NOTE — Patient Instructions (Addendum)
Follow up with Cardiology as planned.  Follow up for stress test next week.  Continue on Albuterol Neb Four times a day  .  Continue on Pulmicort Neb Twice daily  .  Continue on Oxygen 3l/m with activity and 2l/m at rest .  Follow with Dr. Elsworth Soho  In 6 weeks and As needed   Please contact office for sooner follow up if symptoms do not improve or worsen or seek emergency care

## 2015-04-19 NOTE — Assessment & Plan Note (Signed)
Severe COPD with financial restraints and medication intolerances that complicate her medication regimen  She seems to be doing well on current regimen and it is affordable   Plan  Continue on Albuterol Neb Four times a day  .  Continue on Pulmicort Neb Twice daily  .  Continue on Oxygen 3l/m with activity and 2l/m at rest .  Follow with Dr. Elsworth Soho  In 6 weeks and As needed   Please contact office for sooner follow up if symptoms do not improve or worsen or seek emergency care

## 2015-04-19 NOTE — Progress Notes (Signed)
Subjective:    Patient ID: Holly Page, female    DOB: 04-17-1936, 80 y.o.   MRN: [id number]  HPI  78 y.o. F with Gold D Copd primary emphysema on O2 >GOLD card patient  Hx of DVT -05/2014 (tx w/ xarelto until 12/2014 ) -followed by hematology  Left arm superficial venous thrombus , Xarelto 01/25/15 >followed by Hematology    03/01/2015 Follow up : COPD and DVT  Pt returns for 6 week follow up for COPD . Says she is doing very well on her regimen with budesonide and perforomist Was started on Incruse last ov but was not able to tolerate. She was previously intolerant to spiriva as well.  Does complain that insurance is charging her too much for perforomist now.  She was going thru DME company but became too high. Now gets nebs through pharmacy  We called and determined it will be cheaper for Brovana. She is going to try this  May have to cut brovana back to once daily as it will still cost her $ 80 month for brovana and 50-60$ for budesonide (>$100 for perforomist) We also check with [facility] pharmacy as well.  We looked at Medicare B and D filing but unable to get cheaper.  She is going to speak with her pharmacy to see if Medicare B filing would be cheaper.  She denies chest pain, orthopnea, edema , fever or increased albuterol use Last CXR 11/2014 with chronic change  PVX is utd.   She had a thrombo-embolic event in the right inner inferior thigh. This happened in September 2015. She apparently had a DVT back in the 80s. She thinks she was on blood thinner for one month. She was treated with course of Xarelto 05/2014 to 12/2014 with repeat doppler showing no DVT , chronic thickening of the vein wall . She was taken off Xarelto 12/29/14.   01/25/15 she developed left arm pain and swelling, found to have a superficial venous thrombus in the left arm. She was restarted on Xarelto with starter pack and maintenance rx. .  She says she is doing okay on Xarelto and arm pain and swelling have  resolved.   >changed to [facility]    03/15/15 Acute OV : COPD flare  Pt presents for an acute office visit Complains of 1 week of increased dyspnea, DOE , wheezing and tightness.  Got upset at pharmacy b/c they would not fill Brovana.  We called pharmacy and cost is $80 for 30 vials and $160 for 60 vials We discussed using Brovana daily instead of Twice daily  To save money  Advised if breathing declines to alert office. Finances are major issue for pt.  She says she can try to afford $80 . We switched from inhalers to nebs due to high  Of cost with inhalers in past  She has not tolerated tudorza or spiriva in past.  No fever, cough, congestion or edema or hemoptysis .  Last cxr 11/2014 with chronic changes.  Gets anxious easily . Things upset her and then she get more winded.  We discussed relaxation techniques.  On chronic prednisone 10mg  daily  >>increased pred 20mg  daily for 1 wk.   03/26/15  Follow up : COPD  Patient returns for a follow-up for COPD. She says that she feels that her breathing is not getting any better. She has been having difficulty with insurance issues with medication cost. She has been using her Brovana once daily instead of twice  daily to help with cost savings. She is on Pulmicort nebulizer twice daily. She remains on prednisone 10 mg daily. Patient was seen with worsening shortness of breath and chest pain last month. She had a CT chest, on June 20 that showed no evidence of pulmonary embolism  and stable. Severe COPD changes. We reviewed her CT results. Last spirometry showed FEV1 at 30% in 2015.  She has been tried on [place] but was unable to tolerate. She denies any chest pain, palpitations, orthopnea, edema, fever or discolored mucus. Oxygen does drop with pulsing O2 on 2 L was able to keep above 90% on pulsing 3 L. Patient says she has been evaluated by cardiology with a negative stress test recently.  >>increased O2 3l/ act /2  rest   04/19/2015 Follow up : ER visit /COPD /Resp Failure on O2  Pt returns for 2 week follow up .  Was seen in ER on 7/14 for dyspnea  CXR w/ no acute process.  She was recommended by our office to stop Brovana and begin albuterol neb Four times a day  Along  With her budesonide Twice daily  .  She does feel this is working better.  No as sob .  Has also been seeing cardiology with planned stress test next week.  Denies fever, orthopnea, edema or hemoptysis      Review of Systems  Constitutional:   No  weight loss, night sweats,  Fevers, chills, + fatigue, or  lassitude.  HEENT:   No headaches,  Difficulty swallowing,  Tooth/dental problems, or  Sore throat,                No sneezing, itching, ear ache, nasal congestion, post nasal drip,   CV:  No chest pain,  Orthopnea, PND, swelling in lower extremities, anasarca, dizziness, palpitations, syncope.   GI  Notes heartburn, no indigestion, abdominal pain, nausea, vomiting, diarrhea, change in bowel habits, loss of appetite, bloody stools.   Resp:  .    No chest wall deformity  Skin: no rash or lesions.  GU: no dysuria, change in color of urine, no urgency or frequency.  No flank pain, no hematuria   MS:  No joint pain or swelling.  No decreased range of motion.  No back pain.  Psych:  No change in mood or affect.+anxiety.  No memory loss.      Objective:   Physical Exam  GEN: A/Ox3; pleasant , NAD, elderly and frail   HEENT:  [place]/AT,  EACs-clear, TMs-wnl, NOSE-clear, THROAT-clear, no lesions  NECK:  Supple w/ fair ROM; no JVD; normal carotid impulses w/o bruits; no thyromegaly or nodules palpated; no lymphadenopathy.  RESP  Diminished in bases w/ no wheezing or rhonchi.no accessory muscle use, no dullness to percussion  CARD:  RRR, no m/r/g  , no peripheral edema, pulses intact, no cyanosis or clubbing.  GI:   Soft & nt; nml bowel sounds; no organomegaly or masses detected.  Musco: Warm bil, no deformities or  joint swelling noted.   Neuro: alert, no focal deficits noted.    Skin: Warm, no lesions or rashes .       Assessment & Plan:

## 2015-04-22 NOTE — Assessment & Plan Note (Addendum)
Uncontrolled. Advised pt to use albuterol neb every 6 hours.  Keep upcoming apt with pulmonary.

## 2015-04-24 ENCOUNTER — Telehealth: Payer: Self-pay | Admitting: Hematology & Oncology

## 2015-04-24 ENCOUNTER — Telehealth: Payer: Self-pay | Admitting: *Deleted

## 2015-04-24 MED ORDER — RANITIDINE HCL 300 MG PO TABS: 300.0000 mg | ORAL_TABLET | Freq: Every day | ORAL | Status: DC

## 2015-04-24 NOTE — Telephone Encounter (Signed)
Pt applied w Holly Page for her [employer] assistance. However, I don't think she qualified for their servcies, based on the follow up phone call w them. Pt is aware.      COPY SCANNED

## 2015-04-24 NOTE — Telephone Encounter (Signed)
Patient stating that she is having bruising episodes and more pain than usual in her thigh and arm where she has a history of DVT. She wants to come in to be seen by Dr Marin Olp. Dr Antonieta Pert schedule does not allow for seeing patient this week, so patient would like to come in and see Judson Roch, NP. Scheduled patient to come in tomorrow.

## 2015-04-24 NOTE — Telephone Encounter (Signed)
Received fax from [facility] for ranitidine 300mg  1 tablet at bedtime. Refills sent.

## 2015-04-25 ENCOUNTER — Ambulatory Visit (HOSPITAL_BASED_OUTPATIENT_CLINIC_OR_DEPARTMENT_OTHER): Payer: Medicare Other | Admitting: Family

## 2015-04-25 ENCOUNTER — Ambulatory Visit (HOSPITAL_BASED_OUTPATIENT_CLINIC_OR_DEPARTMENT_OTHER)
Admission: RE | Admit: 2015-04-25 | Discharge: 2015-04-25 | Disposition: A | Discharge status: Home / Self Care | Payer: Medicare Other | Source: Ambulatory Visit | Attending: Family | Admitting: Family

## 2015-04-25 ENCOUNTER — Other Ambulatory Visit (HOSPITAL_BASED_OUTPATIENT_CLINIC_OR_DEPARTMENT_OTHER): Payer: Medicare Other

## 2015-04-25 ENCOUNTER — Telehealth: Payer: Self-pay | Admitting: Hematology & Oncology

## 2015-04-25 ENCOUNTER — Telehealth: Payer: Self-pay | Admitting: Family

## 2015-04-25 ENCOUNTER — Encounter: Payer: Self-pay | Admitting: Family

## 2015-04-25 ENCOUNTER — Other Ambulatory Visit: Payer: Self-pay | Admitting: Family

## 2015-04-25 VITALS — BP 131/58 | HR 67 | Resp 16 | Ht 65.0 in | Wt 113.0 lb

## 2015-04-25 DIAGNOSIS — M79604 Pain in right leg: Secondary | ICD-10-CM | POA: Diagnosis present

## 2015-04-25 DIAGNOSIS — Z86718 Personal history of other venous thrombosis and embolism: Secondary | ICD-10-CM

## 2015-04-25 DIAGNOSIS — I82402 Acute embolism and thrombosis of unspecified deep veins of left lower extremity: Secondary | ICD-10-CM

## 2015-04-25 DIAGNOSIS — R202 Paresthesia of skin: Secondary | ICD-10-CM | POA: Diagnosis not present

## 2015-04-25 DIAGNOSIS — Z7901 Long term (current) use of anticoagulants: Secondary | ICD-10-CM

## 2015-04-25 DIAGNOSIS — I809 Phlebitis and thrombophlebitis of unspecified site: Secondary | ICD-10-CM

## 2015-04-25 LAB — CBC WITH DIFFERENTIAL (CANCER CENTER ONLY)
BASO#: 0 10*3/uL (ref 0.0–0.2)
BASO%: 0.4 % (ref 0.0–2.0)
EOS%: 0.2 % (ref 0.0–7.0)
Eosinophils Absolute: 0 10*3/uL (ref 0.0–0.5)
HCT: 37.5 % (ref 34.8–46.6)
HGB: 11.6 g/dL (ref 11.6–15.9)
LYMPH#: 1 10*3/uL (ref 0.9–3.3)
LYMPH%: 18.9 % (ref 14.0–48.0)
MCH: 22.7 pg — ABNORMAL LOW (ref 26.0–34.0)
MCHC: 30.9 g/dL — AB (ref 32.0–36.0)
MCV: 73 fL — AB (ref 81–101)
MONO#: 0.2 10*3/uL (ref 0.1–0.9)
MONO%: 4.1 % (ref 0.0–13.0)
NEUT%: 76.4 % (ref 39.6–80.0)
NEUTROS ABS: 4.1 10*3/uL (ref 1.5–6.5)
PLATELETS: 298 10*3/uL (ref 145–400)
RBC: 5.11 10*6/uL (ref 3.70–5.32)
RDW: 17.1 % — ABNORMAL HIGH (ref 11.1–15.7)
WBC: 5.4 10*3/uL (ref 3.9–10.0)

## 2015-04-25 LAB — COMPREHENSIVE METABOLIC PANEL
ALT: 24 U/L (ref 6–29)
AST: 50 U/L — ABNORMAL HIGH (ref 10–35)
Albumin: 4.1 g/dL (ref 3.6–5.1)
Alkaline Phosphatase: 60 U/L (ref 33–130)
BILIRUBIN TOTAL: 0.3 mg/dL (ref 0.2–1.2)
BUN: 12 mg/dL (ref 7–25)
CO2: 32 mEq/L — ABNORMAL HIGH (ref 20–31)
Calcium: 9.5 mg/dL (ref 8.6–10.4)
Chloride: 104 mEq/L (ref 98–110)
Creatinine, Ser: 0.59 mg/dL — ABNORMAL LOW (ref 0.60–0.93)
Glucose, Bld: 116 mg/dL — ABNORMAL HIGH (ref 65–99)
Potassium: 4.1 mEq/L (ref 3.5–5.3)
Sodium: 145 mEq/L (ref 135–146)
Total Protein: 6.5 g/dL (ref 6.1–8.1)

## 2015-04-25 LAB — D-DIMER, QUANTITATIVE: D-Dimer, Quant: 0.37 ug/mL-FEU (ref 0.00–0.48)

## 2015-04-25 NOTE — Telephone Encounter (Signed)
Discussed with [place] going to leave Sept appt on the schedule not do 3 month

## 2015-04-25 NOTE — Progress Notes (Signed)
Hematology and Oncology Follow Up Visit  Holly Page [id number] 09-Sep-1936 79 y.o. 04/25/2015   Principle Diagnosis:  Thromboembolic disease of the right thigh Superficial thrombus of the left arm   Current Therapy:   Xarelto 20 mg by mouth daily - life long    Interim History:  Holly Page is here today for follow-up. She is having a "pricking" feeling her Doylene Canning which was listed as a possible side effect of Xarelto. We discussed switching her to Eliquis but she would prefer to stay on Xarelto. She states that this is not bothering.  She has had no episodes of bleeding. She has a small bruise on her left forearm where she had an IV for a stress test on Monday.    She denies fatigue, fever, chills, n/v, cough, rash, dizziness, chest pain, palpitations, abdomina pain, constipation, diarrhea, blood in urine or stool. She is SOB with exertion due to COPD and is on supplemental O2 24 hours a day now.  No swelling, tenderness, numbness or tingling in her extremities. She has a tight feeling in the back of her right leg like she did with her initial DVT. Pedal pulses are +1.  Her appetite is good and she is staying well hydrated. Her weight is stable.   Medications:    Medication List       This list is accurate as of: 04/25/15  3:02 PM.  Always use your most recent med list.               acetaminophen 325 MG tablet  Commonly known as:  TYLENOL  Take 2 tablets (650 mg total) by mouth every 6 (six) hours as needed.     acetaminophen-codeine 300-30 MG per tablet  Commonly known as:  TYLENOL #3  Take 1-2 tablets by mouth every 8 (eight) hours as needed for moderate pain or severe pain.     albuterol (2.5 MG/3ML) 0.083% nebulizer solution  Commonly known as:  PROVENTIL  Take 2.5 mg by nebulization every 6 (six) hours as needed for shortness of breath. And as needed     albuterol 108 (90 BASE) MCG/ACT inhaler  Commonly known as:  PROVENTIL HFA;VENTOLIN HFA  Inhale 2 puffs  into the lungs every 6 (six) hours as needed for wheezing or shortness of breath.     budesonide 0.25 MG/2ML nebulizer solution  Commonly known as:  PULMICORT  Take 2 mLs (0.25 mg total) by nebulization 2 (two) times daily. DX: J43.9     cholecalciferol 1000 UNITS tablet  Commonly known as:  VITAMIN D  Take 1,000 Units by mouth daily.     MULTIVITAMIN & MINERAL PO  Take 1 tablet by mouth daily.     potassium chloride SA 20 MEQ tablet  Commonly known as:  K-DUR,KLOR-CON  Take 1 tablet (20 mEq total) by mouth daily.     predniSONE 10 MG tablet  Commonly known as:  DELTASONE  Take two daily x 7days then one daily and stay     ranitidine 300 MG tablet  Commonly known as:  ZANTAC  Take 1 tablet (300 mg total) by mouth at bedtime. 1 tablet at bedtime     rivaroxaban 20 MG Tabs tablet  Commonly known as:  XARELTO  TAKE 1 TABLET BY MOUTH EVERY DAY WITH SUPPER     sodium chloride 0.65 % Soln nasal spray  Commonly known as:  OCEAN  Place 2 sprays into both nostrils as needed for congestion.     verapamil  240 MG CR tablet  Commonly known as:  CALAN-SR  Take 1.5 tablets (360 mg total) by mouth daily.        Allergies:  Allergies  Allergen Reactions  . Incruse Ellipta [Umeclidinium Bromide] Shortness Of Breath  . Tramadol Shortness Of Breath  . Amlodipine     Other reaction(s): SWELLING  . Aspirin     Other reaction(s): PALPITATIONS Other reaction(s): DIFFICULTY BREATHING Heart flutter  . Codeine Other (See Comments)    Hallucinations, can take Tylenol #3 now  . Doxycycline     Other reaction(s): NAUSEA,VOMITING  . Hydrocodone     hallucinations  . Losartan     unknown  . Propoxyphene Nausea Only    Other reaction(s): NAUSEA  . Tiotropium Itching and Rash    Past Medical History, Surgical history, Social history, and Family History were reviewed and updated.  Review of Systems: All other 10 point review of systems is negative.   Physical Exam:  height is 5\' 5"   (1.651 m) and weight is 113 lb (51.256 kg). Her blood pressure is 131/58 and her pulse is 67. Her respiration is 16.   Wt Readings from Last 3 Encounters:  04/25/15 113 lb (51.256 kg)  04/19/15 112 lb (50.803 kg)  04/16/15 112 lb 9.6 oz (51.075 kg)    Ocular: Sclerae unicteric, pupils equal, round and reactive to light Ear-nose-throat: Oropharynx clear, dentition fair Lymphatic: No cervical or supraclavicular adenopathy Lungs no rales or rhonchi, good excursion bilaterally Heart regular rate and rhythm, no murmur appreciated Abd soft, nontender, positive bowel sounds MSK no focal spinal tenderness, no joint edema Neuro: non-focal, well-oriented, appropriate affect Breasts: Deferred  Lab Results  Component Value Date   WBC 5.4 04/25/2015   HGB 11.6 04/25/2015   HCT 37.5 04/25/2015   MCV 73* 04/25/2015   PLT 298 04/25/2015   Lab Results  Component Value Date   FERRITIN 209.1 12/19/2014   IRON 66 12/21/2014   TIBC 342 12/21/2014   UIBC 276 12/21/2014   IRONPCTSAT 19* 12/21/2014   Lab Results  Component Value Date   RBC 5.11 04/25/2015   No results found for: KPAFRELGTCHN, LAMBDASER, KAPLAMBRATIO No results found for: IGGSERUM, IGA, IGMSERUM No results found for: Odetta Pink, SPEI   Chemistry      Component Value Date/Time   NA 145 04/12/2015 0853   K 3.7 04/12/2015 0853   CL 101 04/12/2015 0853   CO2 35* 04/12/2015 0853   BUN 13 04/12/2015 0853   CREATININE 0.52 04/12/2015 0853   CREATININE 0.60 01/25/2015 1010      Component Value Date/Time   CALCIUM 9.5 04/12/2015 0853   ALKPHOS 63 03/19/2015 1005   AST 45* 03/19/2015 1005   ALT 22 03/19/2015 1005   BILITOT 0.5 03/19/2015 1005     Impression and Plan: Holly Page is 79 year old African-American female with history of multiple DVT's. The superficial blood clot in her left arm has now resolved. She is now on lifelong anticoagulation.  She has had a  "prickling" feeling in her thighs which can be a side effect of Xarelto.  She has a "tightness" in the back of her right leg so we will go ahead and get an [place] to assess.  She has an appointment in September with Dr. Marin Olp so we will keep that.  She knows to call here with any questions or concerns. We can certainly see her sooner if need be.   Eliezer Bottom, NP 7/27/20163:02  PM

## 2015-04-25 NOTE — Telephone Encounter (Signed)
Recvd letter from [facility] saying pt was not eligible for the services at this time. Pt is aware of the requirements and denial decision.  Record ID: [id number]       COPY SCANNED

## 2015-04-27 ENCOUNTER — Telehealth: Payer: Self-pay | Admitting: Family

## 2015-04-27 NOTE — Telephone Encounter (Signed)
Spoke with Holly Page and let her know her US of the right leg on 7/27 was negative for DVT. All questions were answered. We will see her in September for a follow-up.

## 2015-05-11 ENCOUNTER — Encounter: Payer: Self-pay | Admitting: Family

## 2015-05-11 ENCOUNTER — Ambulatory Visit (INDEPENDENT_AMBULATORY_CARE_PROVIDER_SITE_OTHER): Payer: Medicare Other | Admitting: Family

## 2015-05-11 VITALS — BP 130/68 | HR 67 | Temp 97.8°F | Ht 65.0 in | Wt 113.4 lb

## 2015-05-11 DIAGNOSIS — M549 Dorsalgia, unspecified: Secondary | ICD-10-CM | POA: Diagnosis not present

## 2015-05-11 DIAGNOSIS — J439 Emphysema, unspecified: Secondary | ICD-10-CM

## 2015-05-11 DIAGNOSIS — K219 Gastro-esophageal reflux disease without esophagitis: Secondary | ICD-10-CM

## 2015-05-11 DIAGNOSIS — I10 Essential (primary) hypertension: Secondary | ICD-10-CM

## 2015-05-11 DIAGNOSIS — M4807 Spinal stenosis, lumbosacral region: Secondary | ICD-10-CM

## 2015-05-11 DIAGNOSIS — R739 Hyperglycemia, unspecified: Secondary | ICD-10-CM | POA: Diagnosis not present

## 2015-05-11 DIAGNOSIS — I82402 Acute embolism and thrombosis of unspecified deep veins of left lower extremity: Secondary | ICD-10-CM | POA: Diagnosis not present

## 2015-05-11 LAB — HEMOGLOBIN A1C: HEMOGLOBIN A1C: 5.9 % (ref 4.6–6.5)

## 2015-05-11 MED ORDER — ACETAMINOPHEN-CODEINE #3 300-30 MG PO TABS: 1.0000 | ORAL_TABLET | Freq: Three times a day (TID) | ORAL | Status: DC | PRN

## 2015-05-11 NOTE — Assessment & Plan Note (Signed)
BP stable on verapamil.  Continue same.

## 2015-05-11 NOTE — Assessment & Plan Note (Signed)
At baseline, continue current meds.

## 2015-05-11 NOTE — Progress Notes (Signed)
Subjective:    Patient ID: Holly Page, female    DOB: 1935-10-11, 79 y.o.   MRN: [id number]  HPI  Holly Page is a 79 yr old female who presents today for follow up.  Hx of multiple DVT's- she is on lifelong anticoagulation (Xarelto) and is followed by Hematology.    COPD- she recently saw Rexene Edison NP (pulmonology).  She continues continuous oxygen via nasal cannula.  She has struggled with affording her medications.  Current meds include albuterol neg,  Pulmicort, prednisone.  HTN-  She is maintained on verapamil.   BP Readings from Last 3 Encounters:  05/11/15 130/68  04/25/15 131/58  04/19/15 122/70    Spinal stenosis-   She does not wish to persue surgical management and is a poor surgical candidate. She is currently using tylenol #3 as needed.    GERD- reports "bitter taste in her mouth." + compliance with zantac.   Reports soft stools- up to 3-4 times.  Most days 1-2 times a day.  Not watery.  Review of Systems See HPI  Past Medical History  Diagnosis Date  . Pneumonia   . COPD (chronic obstructive pulmonary disease)   . Hypertension   . DVT (deep venous thrombosis) 1980s, recurrent 2015    Right DVT 2015  on xarelto  . GERD (gastroesophageal reflux disease)   . Lactose intolerance   . Spinal stenosis   . Meningioma     followed by Dr Christella Noa    Social History   Social History  . Marital Status: Divorced    Spouse Name: N/A  . Number of Children: 4  . Years of Education: college   Occupational History  . Retired    Social History Main Topics  . Smoking status: Former Smoker -- 1.50 packs/day for 56 years    Types: Cigarettes    Quit date: 04/29/2012  . Smokeless tobacco: Never Used     Comment: quit smoking 3 years ago  . Alcohol Use: No     Comment: quit drinking beer 2005  . Drug Use: No  . Sexual Activity: Not on file   Other Topics Concern  . Not on file   Social History Narrative   Patient lives at home alone- divorced, no  pets.     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 Foot surgery    . Cholecystectomy    . Tonsillectomy    . Tubal ligation      Family History  Problem Relation Age of Onset  . Lupus Sister   . COPD Father     smoker deceased.   [name] Heart attack Son     Allergies  Allergen Reactions  . Incruse Ellipta [Umeclidinium Bromide] Shortness Of Breath  . Tramadol Shortness Of Breath  . Amlodipine     Other reaction(s): SWELLING  . Aspirin     Other reaction(s): PALPITATIONS Other reaction(s): DIFFICULTY BREATHING Heart flutter  . Codeine Other (See Comments)    Hallucinations, can take Tylenol #3 now  . Doxycycline     Other reaction(s): NAUSEA,VOMITING  . Hydrocodone     hallucinations  . Losartan  unknown  . Propoxyphene Nausea Only    Other reaction(s): NAUSEA  . Tiotropium Itching and Rash    Current Outpatient Prescriptions on File Prior to Visit  Medication Sig Dispense Refill  . acetaminophen (TYLENOL) 325 MG tablet Take 2 tablets (650 mg total) by mouth every 6 (six) hours as needed. 15 tablet 0  . acetaminophen-codeine (TYLENOL #3) 300-30 MG per tablet Take 1-2 tablets by mouth every 8 (eight) hours as needed for moderate pain or severe pain. 30 tablet 0  . albuterol (PROVENTIL HFA;VENTOLIN HFA) 108 (90 BASE) MCG/ACT inhaler Inhale 2 puffs into the lungs every 6 (six) hours as needed for wheezing or shortness of breath. 1 Inhaler 3  . albuterol (PROVENTIL) (2.5 MG/3ML) 0.083% nebulizer solution Take 2.5 mg by nebulization every 6 (six) hours as needed for shortness of breath. And as needed    . budesonide (PULMICORT) 0.25 MG/2ML nebulizer solution Take 2 mLs (0.25 mg total) by nebulization 2 (two) times daily. DX: J43.9 120  mL 12  . cholecalciferol (VITAMIN D) 1000 UNITS tablet Take 1,000 Units by mouth daily.    . Multiple Vitamins-Minerals (MULTIVITAMIN & MINERAL PO) Take 1 tablet by mouth daily.    . potassium chloride SA (K-DUR,KLOR-CON) 20 MEQ tablet Take 1 tablet (20 mEq total) by mouth daily. 30 tablet 1  . predniSONE (DELTASONE) 10 MG tablet Take two daily x 7days then one daily and stay (Patient taking differently: 10 mg daily with breakfast. ) 60 tablet 6  . ranitidine (ZANTAC) 300 MG tablet Take 1 tablet (300 mg total) by mouth at bedtime. 1 tablet at bedtime 30 tablet 5  . rivaroxaban (XARELTO) 20 MG TABS tablet TAKE 1 TABLET BY MOUTH EVERY DAY WITH SUPPER 30 tablet 3  . sodium chloride (OCEAN) 0.65 % SOLN nasal spray Place 2 sprays into both nostrils as needed for congestion.    . verapamil (CALAN-SR) 240 MG CR tablet Take 1.5 tablets (360 mg total) by mouth daily. 45 tablet 5   No current facility-administered medications on file prior to visit.    BP 130/68 mmHg  Pulse 67  Temp(Src) 97.8 F (36.6 C) (Oral)  Ht 5\' 5"  (1.651 m)  Wt 113 lb 6 oz (51.427 kg)  BMI 18.87 kg/m2  SpO2 96%       Objective:   Physical Exam  Constitutional: She is oriented to person, place, and time. She appears well-developed and well-nourished.  HENT:  Head: Normocephalic and atraumatic.  Cardiovascular: Normal rate, regular rhythm and normal heart sounds.   No murmur heard. Pulmonary/Chest: Effort normal. She has no wheezes. She has no rales.  Diminished breath sounds throughout  Musculoskeletal: She exhibits no edema.  Neurological: She is alert and oriented to person, place, and time.  Psychiatric: She has a normal mood and affect. Her behavior is normal. Judgment and thought content normal.          Assessment & Plan:

## 2015-05-11 NOTE — Assessment & Plan Note (Signed)
Declines pain management due to cost.  Has controlled substance contract. Obtain UDS, refill tylenol #3 provided.

## 2015-05-11 NOTE — Assessment & Plan Note (Signed)
On lifelong anticoagulation.  

## 2015-05-11 NOTE — Progress Notes (Signed)
Pre visit review using our clinic review tool, if applicable. No additional management support is needed unless otherwise documented below in the visit note. 

## 2015-05-11 NOTE — Patient Instructions (Addendum)
Please complete lab work prior to leaving (including UDS).   Food Choices for Gastroesophageal Reflux Disease When you have gastroesophageal reflux disease (GERD), the foods you eat and your eating habits are very important. Choosing the right foods can help ease the discomfort of GERD. WHAT GENERAL GUIDELINES DO I NEED TO FOLLOW?  Choose fruits, vegetables, whole grains, low-fat dairy products, and low-fat meat, fish, and poultry.  Limit fats such as oils, salad dressings, butter, nuts, and avocado.  Keep a food diary to identify foods that cause symptoms.  Avoid foods that cause reflux. These may be different for different people.  Eat frequent small meals instead of three large meals each day.  Eat your meals slowly, in a relaxed setting.  Limit fried foods.  Cook foods using methods other than frying.  Avoid drinking alcohol.  Avoid drinking large amounts of liquids with your meals.  Avoid bending over or lying down until 2-3 hours after eating. WHAT FOODS ARE NOT RECOMMENDED? The following are some foods and drinks that may worsen your symptoms: Vegetables Tomatoes. Tomato juice. Tomato and spaghetti sauce. Chili peppers. Onion and garlic. Horseradish. Fruits Oranges, grapefruit, and lemon (fruit and juice). Meats High-fat meats, fish, and poultry. This includes hot dogs, ribs, ham, sausage, salami, and bacon. Dairy Whole milk and chocolate milk. Sour cream. Cream. Butter. Ice cream. Cream cheese.  Beverages Coffee and tea, with or without caffeine. Carbonated beverages or energy drinks. Condiments Hot sauce. Barbecue sauce.  Sweets/Desserts Chocolate and cocoa. Donuts. Peppermint and spearmint. Fats and Oils High-fat foods, including [place] fries and potato chips. Other Vinegar. Strong spices, such as black pepper, white pepper, red pepper, cayenne, curry powder, cloves, ginger, and chili powder. The items listed above may not be a complete list of foods and  beverages to avoid. Contact your dietitian for more information. Document Released: 09/15/2005 Document Revised: 09/20/2013 Document Reviewed: 07/20/2013 [facility] Patient Information 2015 [place], [place]. This information is not intended to replace advice given to you by your health care provider. Make sure you discuss any questions you have with your health care provider.

## 2015-05-11 NOTE — Assessment & Plan Note (Signed)
Discussed changing zantac to PPI, she declines. Wishes to work on diet.

## 2015-05-16 ENCOUNTER — Ambulatory Visit: Payer: Medicare Other | Admitting: Family

## 2015-05-17 ENCOUNTER — Other Ambulatory Visit: Payer: Self-pay | Admitting: *Deleted

## 2015-05-17 ENCOUNTER — Ambulatory Visit (INDEPENDENT_AMBULATORY_CARE_PROVIDER_SITE_OTHER): Payer: Medicare Other | Admitting: Adult Health

## 2015-05-17 ENCOUNTER — Encounter: Payer: Self-pay | Admitting: Adult Health

## 2015-05-17 VITALS — BP 137/73 | HR 67 | Ht 64.0 in | Wt 112.0 lb

## 2015-05-17 DIAGNOSIS — J9611 Chronic respiratory failure with hypoxia: Secondary | ICD-10-CM | POA: Diagnosis not present

## 2015-05-17 DIAGNOSIS — J439 Emphysema, unspecified: Secondary | ICD-10-CM

## 2015-05-17 MED ORDER — ALBUTEROL SULFATE (2.5 MG/3ML) 0.083% IN NEBU: 2.5000 mg | INHALATION_SOLUTION | Freq: Four times a day (QID) | RESPIRATORY_TRACT | Status: DC | PRN

## 2015-05-17 NOTE — Progress Notes (Signed)
Subjective:    Patient ID: Holly Page, female    DOB: Feb 17, 1936, 79 y.o.   MRN: [id number]  HPI    Review of Systems     Objective:   Physical Exam        Assessment & Plan:      Subjective:    Patient ID: Holly Page, female    DOB: [birth year], 79 y.o.   MRN: [id number]  HPI  79 y.o. F with Gold D Copd primary emphysema on O2 >GOLD card patient  Hx of DVT -05/2014 (tx w/ xarelto until 12/2014 ) -followed by hematology  Left arm superficial venous thrombus , Xarelto 01/25/15 >followed by Hematology    03/01/2015 Follow up : COPD and DVT  Pt returns for 6 week follow up for COPD . Says she is doing very well on her regimen with budesonide and perforomist Was started on Incruse last ov but was not able to tolerate. She was previously intolerant to spiriva as well.  Does complain that insurance is charging her too much for perforomist now.  She was going thru DME company but became too high. Now gets nebs through pharmacy  We called and determined it will be cheaper for Brovana. She is going to try this  May have to cut brovana back to once daily as it will still cost her $ 80 month for brovana and 50-60$ for budesonide (>$100 for perforomist) We also check with [facility] pharmacy as well.  We looked at Medicare B and D filing but unable to get cheaper.  She is going to speak with her pharmacy to see if Medicare B filing would be cheaper.  She denies chest pain, orthopnea, edema , fever or increased albuterol use Last CXR 11/2014 with chronic change  PVX is utd.   She had a thrombo-embolic event in the right inner inferior thigh. This happened in September 2015. She apparently had a DVT back in the 80s. She thinks she was on blood thinner for one month. She was treated with course of Xarelto 05/2014 to 12/2014 with repeat doppler showing no DVT , chronic thickening of the vein wall . She was taken off Xarelto 12/29/14.   01/25/15 she developed left arm pain and swelling,  found to have a superficial venous thrombus in the left arm. She was restarted on Xarelto with starter pack and maintenance rx. .  She says she is doing okay on Xarelto and arm pain and swelling have resolved.   >changed to [facility]    03/15/15 Acute OV : COPD flare  Pt presents for an acute office visit Complains of 1 week of increased dyspnea, DOE , wheezing and tightness.  Got upset at pharmacy b/c they would not fill Brovana.  We called pharmacy and cost is $80 for 30 vials and $160 for 60 vials We discussed using Brovana daily instead of Twice daily  To save money  Advised if breathing declines to alert office. Finances are major issue for pt.  She says she can try to afford $80 . We switched from inhalers to nebs due to high  Of cost with inhalers in past  She has not tolerated tudorza or spiriva in past.  No fever, cough, congestion or edema or hemoptysis .  Last cxr 11/2014 with chronic changes.  Gets anxious easily . Things upset her and then she get more winded.  We discussed relaxation techniques.  On chronic prednisone 10mg  daily  >>increased pred 20mg  daily for 1 wk.  03/26/15  Follow up : COPD  Patient returns for a follow-up for COPD. She says that she feels that her breathing is not getting any better. She has been having difficulty with insurance issues with medication cost. She has been using her Brovana once daily instead of twice daily to help with cost savings. She is on Pulmicort nebulizer twice daily. She remains on prednisone 10 mg daily. Patient was seen with worsening shortness of breath and chest pain last month. She had a CT chest, on June 20 that showed no evidence of pulmonary embolism  and stable. Severe COPD changes. We reviewed her CT results. Last spirometry showed FEV1 at 30% in 2015.  She has been tried on [place] but was unable to tolerate. She denies any chest pain, palpitations, orthopnea, edema, fever or discolored  mucus. Oxygen does drop with pulsing O2 on 2 L was able to keep above 90% on pulsing 3 L. Patient says she has been evaluated by cardiology with a negative stress test recently.  >>increased O2 3l/ act /2 rest   04/19/15  Follow up : ER visit /COPD /Resp Failure on O2  Pt returns for 2 week follow up .  Was seen in ER on 7/14 for dyspnea  CXR w/ no acute process.  She was recommended by our office to stop Brovana and begin albuterol neb Four times a day  Along  With her budesonide Twice daily  .  She does feel this is working better.  No as sob .  Has also been seeing cardiology with planned stress test next week.  Denies fever, orthopnea, edema or hemoptysis  >>refer to cardiology , continue on budesonide and albuterol   05/17/2015 Follow up : COPD and Resp failure on O2  Pt returns for 1 month follow up for COPD .  Last ov with ongoing dyspnea and chest tightness, refer to cards Seen by [place] cardiology -Dr. Atilano Median  , patient says does not feel her symptoms are heart related.   Had venous doppler recently of right leg for pain which was neg for DVt.   Remains on Budesonide neb Twice daily  And Albuterol Neb Four times a day  .  Remains on prednisone 10mg  daily .  Remains on O2 2l/m rest and 3l/m with act .  PVX utd. Does not want prevnar today , will discuss later.  Refuses flu shot.   Denies chest pain, orthopnea, edema or fever.   Review of Systems  Constitutional:   No  weight loss, night sweats,  Fevers, chills, + fatigue, or  lassitude.  HEENT:   No headaches,  Difficulty swallowing,  Tooth/dental problems, or  Sore throat,                No sneezing, itching, ear ache, nasal congestion, post nasal drip,   CV:  No chest pain,  Orthopnea, PND, swelling in lower extremities, anasarca, dizziness, palpitations, syncope.   GI  Notes heartburn, no indigestion, abdominal pain, nausea, vomiting, diarrhea, change in bowel habits, loss of appetite, bloody stools.   Resp:  .     No chest wall deformity  Skin: no rash or lesions.  GU: no dysuria, change in color of urine, no urgency or frequency.  No flank pain, no hematuria   MS:  No joint pain or swelling.  No decreased range of motion.  No back pain.  Psych:  No change in mood or affect.+anxiety.  No memory loss.  Objective:   Physical Exam  GEN: A/Ox3; pleasant , NAD, elderly and frail   HEENT:  [place]/AT,  EACs-clear, TMs-wnl, NOSE-clear, THROAT-clear, no lesions  NECK:  Supple w/ fair ROM; no JVD; normal carotid impulses w/o bruits; no thyromegaly or nodules palpated; no lymphadenopathy.  RESP  Diminished in bases w/ no wheezing or rhonchi.no accessory muscle use, no dullness to percussion  CARD:  RRR, no m/r/g  , no peripheral edema, pulses intact, no cyanosis or clubbing.  GI:   Soft & nt; nml bowel sounds; no organomegaly or masses detected.  Musco: Warm bil, no deformities or joint swelling noted.   Neuro: alert, no focal deficits noted.    Skin: Warm, no lesions or rashes .       Assessment & Plan:

## 2015-05-17 NOTE — Assessment & Plan Note (Signed)
Compensated on O2  Plan  Continue on Oxygen 3l/m with activity and 2l/m at rest .  Follow with Dr. Elsworth Soho  In 4 weeks as planned  and As needed   Please contact office for sooner follow up if symptoms do not improve or worsen or seek emergency care

## 2015-05-17 NOTE — Assessment & Plan Note (Signed)
Compensated on present regimen without flare   Plan  Continue on Albuterol Neb Four times a day  .  Continue on Pulmicort Neb Twice daily  .  Continue on Oxygen 3l/m with activity and 2l/m at rest .  Follow with Dr. Elsworth Soho  In 4 weeks as planned  and As needed   Please contact office for sooner follow up if symptoms do not improve or worsen or seek emergency care

## 2015-05-17 NOTE — Patient Instructions (Addendum)
Continue on Albuterol Neb Four times a day  .  Continue on Pulmicort Neb Twice daily  .  Continue on Oxygen 3l/m with activity and 2l/m at rest .  Follow with Dr. Elsworth Soho  In 4 weeks as planned  and As needed   Please contact office for sooner follow up if symptoms do not improve or worsen or seek emergency care

## 2015-05-18 ENCOUNTER — Telehealth: Payer: Self-pay | Admitting: Pulmonary Disease

## 2015-05-18 MED ORDER — ALBUTEROL SULFATE (2.5 MG/3ML) 0.083% IN NEBU: 2.5000 mg | INHALATION_SOLUTION | Freq: Four times a day (QID) | RESPIRATORY_TRACT | Status: DC | PRN

## 2015-05-18 NOTE — Telephone Encounter (Signed)
Patient calling because she should be getting 353ml of Albuterol now instead of 64ml.   Re-submitted Rx to [facility]. Patient notified. Nothing further needed.

## 2015-05-21 ENCOUNTER — Telehealth: Payer: Self-pay | Admitting: Family

## 2015-05-21 NOTE — Progress Notes (Signed)
Reviewed & agree with plan  

## 2015-05-21 NOTE — Telephone Encounter (Signed)
Pt notified and made aware that urine sample was noted.  It appears urine sample was collected for UDS on 05/11/15.

## 2015-05-21 NOTE — Telephone Encounter (Signed)
Pt called stating she got A1C results but wondering about urine done the same day 05/11/15. I don't see anything on file. Please call pt home #.

## 2015-05-28 ENCOUNTER — Ambulatory Visit (INDEPENDENT_AMBULATORY_CARE_PROVIDER_SITE_OTHER): Payer: Medicare Other | Admitting: Family

## 2015-05-28 ENCOUNTER — Ambulatory Visit (HOSPITAL_BASED_OUTPATIENT_CLINIC_OR_DEPARTMENT_OTHER)
Admission: RE | Admit: 2015-05-28 | Discharge: 2015-05-28 | Disposition: A | Discharge status: Home / Self Care | Payer: Medicare Other | Source: Ambulatory Visit | Attending: Family | Admitting: Family

## 2015-05-28 ENCOUNTER — Encounter: Payer: Self-pay | Admitting: Family

## 2015-05-28 VITALS — BP 142/82 | HR 83 | Temp 98.0°F | Resp 16 | Wt 112.2 lb

## 2015-05-28 DIAGNOSIS — R519 Headache, unspecified: Secondary | ICD-10-CM

## 2015-05-28 DIAGNOSIS — R51 Headache: Secondary | ICD-10-CM | POA: Insufficient documentation

## 2015-05-28 NOTE — Progress Notes (Signed)
Pre visit review using our clinic review tool, if applicable. No additional management support is needed unless otherwise documented below in the visit note. 

## 2015-05-28 NOTE — Patient Instructions (Signed)
Please complete CT scan on the first floor.  We will contact you with your results.  

## 2015-05-28 NOTE — Progress Notes (Signed)
Subjective:    Patient ID: Holly Page, female    DOB: Aug 28, 1936, 79 y.o.   MRN: [id number]  HPI  Holly Page is a 79 yr old female who presents today with chief complaint of headache.  She reports that she fell 2-3 months ago and hit her head on a TV stand.  Of note she has a hx of meningioma which is followed by Holly Page. She reports tha she slipped on the rug and hit her head. Placed a cold compress.  Reports some pressure above her eyes.  She denies LOC with the fall.  She reports that she had  A hematoma above the right brow for 2-3 days.  She denies nausea currently. Denies sinus drainage.    Review of Systems See HPI  Past Medical History  Diagnosis Date  . Pneumonia   . COPD (chronic obstructive pulmonary disease)   . Hypertension   . DVT (deep venous thrombosis) 1980s, recurrent 2015    Right DVT 2015  on xarelto  . GERD (gastroesophageal reflux disease)   . Lactose intolerance   . Spinal stenosis   . Meningioma     followed by Dr Christella Page    Social History   Social History  . Marital Status: Divorced    Spouse Name: N/A  . Number of Children: 4  . Years of Education: college   Occupational History  . Retired    Social History Main Topics  . Smoking status: Former Smoker -- 1.50 packs/day for 56 years    Types: Cigarettes    Quit date: 04/29/2012  . Smokeless tobacco: Never Used     Comment: quit smoking 3 years ago  . Alcohol Use: No     Comment: quit drinking beer 2005  . Drug Use: No  . Sexual Activity: Not on file   Other Topics Concern  . Not on file   Social History Narrative   Patient lives at home alone- divorced, no pets.     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 Foot surgery    .  Cholecystectomy    . Tonsillectomy    . Tubal ligation      Family History  Problem Relation Age of Onset  . Lupus Sister   . COPD Father     smoker deceased.   [name] Heart attack Son     Allergies  Allergen Reactions  . Incruse Ellipta [Umeclidinium Bromide] Shortness Of Breath  . Tramadol Shortness Of Breath  . Amlodipine     Other reaction(s): SWELLING  . Aspirin     Other reaction(s): PALPITATIONS Other reaction(s): DIFFICULTY BREATHING Heart flutter  . Codeine Other (See Comments)    Hallucinations, can take Tylenol #3 now  . Doxycycline     Other reaction(s): NAUSEA,VOMITING  . Hydrocodone     hallucinations  . Losartan     unknown  . Propoxyphene Nausea Only    Other reaction(s): NAUSEA  . Tiotropium Itching and Rash    Current Outpatient Prescriptions on File Prior to Visit  Medication Sig Dispense Refill  . acetaminophen-codeine (TYLENOL #3) 300-30 MG per tablet Take 1-2 tablets by mouth every  8 (eight) hours as needed for moderate pain or severe pain. 30 tablet 0  . albuterol (PROVENTIL HFA;VENTOLIN HFA) 108 (90 BASE) MCG/ACT inhaler Inhale 2 puffs into the lungs every 6 (six) hours as needed for wheezing or shortness of breath. 1 Inhaler 3  . albuterol (PROVENTIL) (2.5 MG/3ML) 0.083% nebulizer solution Take 3 mLs (2.5 mg total) by nebulization every 6 (six) hours as needed for shortness of breath. And as needed 375 mL 2  . budesonide (PULMICORT) 0.25 MG/2ML nebulizer solution Take 2 mLs (0.25 mg total) by nebulization 2 (two) times daily. DX: J43.9 120 mL 12  . cholecalciferol (VITAMIN D) 1000 UNITS tablet Take 1,000 Units by mouth daily.    . Multiple Vitamins-Minerals (MULTIVITAMIN & MINERAL PO) Take 1 tablet by mouth daily.    . potassium chloride SA (K-DUR,KLOR-CON) 20 MEQ tablet Take 1 tablet (20 mEq total) by mouth daily. 30 tablet 1  . predniSONE (DELTASONE) 10 MG tablet Take two daily x 7days then one daily and stay (Patient taking differently: 10 mg daily  with breakfast. ) 60 tablet 6  . ranitidine (ZANTAC) 300 MG tablet Take 1 tablet (300 mg total) by mouth at bedtime. 1 tablet at bedtime 30 tablet 5  . rivaroxaban (XARELTO) 20 MG TABS tablet TAKE 1 TABLET BY MOUTH EVERY DAY WITH SUPPER 30 tablet 3  . sodium chloride (OCEAN) 0.65 % SOLN nasal spray Place 2 sprays into both nostrils as needed for congestion.    . verapamil (CALAN-SR) 240 MG CR tablet Take 1.5 tablets (360 mg total) by mouth daily. 45 tablet 5   No current facility-administered medications on file prior to visit.    BP 142/82 mmHg  Pulse 83  Temp(Src) 98 F (36.7 C) (Oral)  Resp 16  Wt 112 lb 4 oz (50.916 kg)  SpO2 98%       Objective:   Physical Exam  Constitutional: She is oriented to person, place, and time. She appears well-developed and well-nourished.  HENT:  Head: Normocephalic and atraumatic.  Eyes: Pupils are equal, round, and reactive to light.  Cardiovascular: Normal rate, regular rhythm and normal heart sounds.   No murmur heard. Pulmonary/Chest: Effort normal. No respiratory distress. She has no wheezes.  Diminished breath sounds throughout  Musculoskeletal: She exhibits no edema.  Neurological: She is alert and oriented to person, place, and time. She exhibits normal muscle tone. Coordination normal.  Psychiatric: She has a normal mood and affect. Her behavior is normal. Judgment and thought content normal.          Assessment & Plan:  79 yr old female with hx of meningioma and head trauma 2-3 months ago, now with 2 week hx of HA.  Will obtain CT head to evaluate for bleed.  I suspect CT will be OK.

## 2015-06-01 ENCOUNTER — Telehealth: Payer: Self-pay | Admitting: Critical Care Medicine

## 2015-06-01 NOTE — Telephone Encounter (Signed)
Spoke with pt, states her albuterol rx was sent in incorrectly- states she normally gets 5 "boxes" and was only given 1 "box" with her last refill.  I advised that 348mL was sent to pharmacy and that should be a 30 day supply based on how she's taking it.  Pt states this is incorrect and is requesting the right rx be sent to the pharmacy.    Called pharmacy, states that the "five box" rx is ready for pickup.    Spoke with pt, she's aware of rx.  Nothing further needed.

## 2015-06-07 ENCOUNTER — Encounter: Payer: Self-pay | Admitting: Family

## 2015-06-13 ENCOUNTER — Other Ambulatory Visit (HOSPITAL_BASED_OUTPATIENT_CLINIC_OR_DEPARTMENT_OTHER): Payer: Medicare Other

## 2015-06-13 ENCOUNTER — Ambulatory Visit (HOSPITAL_BASED_OUTPATIENT_CLINIC_OR_DEPARTMENT_OTHER): Payer: Medicare Other | Admitting: Hematology & Oncology

## 2015-06-13 ENCOUNTER — Other Ambulatory Visit: Payer: Self-pay | Admitting: Family

## 2015-06-13 ENCOUNTER — Encounter: Payer: Self-pay | Admitting: Hematology & Oncology

## 2015-06-13 VITALS — BP 147/68 | HR 76 | Temp 98.1°F | Resp 20 | Ht 64.0 in | Wt 112.0 lb

## 2015-06-13 DIAGNOSIS — I82402 Acute embolism and thrombosis of unspecified deep veins of left lower extremity: Secondary | ICD-10-CM

## 2015-06-13 LAB — CBC WITH DIFFERENTIAL (CANCER CENTER ONLY)
BASO#: 0 10*3/uL (ref 0.0–0.2)
BASO%: 0.2 % (ref 0.0–2.0)
EOS%: 1.3 % (ref 0.0–7.0)
Eosinophils Absolute: 0.1 10*3/uL (ref 0.0–0.5)
HCT: 38.6 % (ref 34.8–46.6)
HGB: 11.8 g/dL (ref 11.6–15.9)
LYMPH#: 3.2 10*3/uL (ref 0.9–3.3)
LYMPH%: 35.5 % (ref 14.0–48.0)
MCH: 22.9 pg — AB (ref 26.0–34.0)
MCHC: 30.6 g/dL — ABNORMAL LOW (ref 32.0–36.0)
MCV: 75 fL — AB (ref 81–101)
MONO#: 0.9 10*3/uL (ref 0.1–0.9)
MONO%: 10.4 % (ref 0.0–13.0)
NEUT#: 4.7 10*3/uL (ref 1.5–6.5)
NEUT%: 52.6 % (ref 39.6–80.0)
Platelets: 290 10*3/uL (ref 145–400)
RBC: 5.16 10*6/uL (ref 3.70–5.32)
RDW: 16.9 % — AB (ref 11.1–15.7)
WBC: 9 10*3/uL (ref 3.9–10.0)

## 2015-06-13 LAB — D-DIMER, QUANTITATIVE: D-Dimer, Quant: 0.27 ug/mL-FEU (ref 0.00–0.48)

## 2015-06-13 NOTE — Progress Notes (Signed)
Hematology and Oncology Follow Up Visit  Holly Page [id number] 09/07/1936 79 y.o. 06/13/2015   Principle Diagnosis:  Thromboembolic disease of the right thigh Superficial thrombophlebitis of the left arm  Current Therapy:   Xarelto 20 mg by mouth daily -to stop in December 2016   Interim History:  Holly Page is here today for follow-up. [name] She has a lot of medical issues. It is hard to say what is the problem. She complains of being hot and cold. She complains of bruises. She complains of sores not healing. I think a lot of this is because she is on prednisone. She has bad underlying lung disease. She is oxygen dependent.  I told Holly Page that I really am not the one who needs to deal with these issues. However, she says that Holly Page family doctor is not helping Holly Page. I find that hard to believe since [name] is very conscientious.  She has not had any problems with bleeding.  There's been no issues with bowels or bladder.  She's had no hemoptysis.   Overall, Holly Page performance status is ECOG 2. .   Medications:    Medication List       This list is accurate as of: 06/13/15  1:11 PM.  Always use your most recent med list.               acetaminophen-codeine 300-30 MG per tablet  Commonly known as:  TYLENOL #3  Take 1-2 tablets by mouth every 8 (eight) hours as needed for moderate pain or severe pain.     albuterol 108 (90 BASE) MCG/ACT inhaler  Commonly known as:  PROVENTIL HFA;VENTOLIN HFA  Inhale 2 puffs into the lungs every 6 (six) hours as needed for wheezing or shortness of breath.     albuterol (2.5 MG/3ML) 0.083% nebulizer solution  Commonly known as:  PROVENTIL  Take 3 mLs (2.5 mg total) by nebulization every 6 (six) hours as needed for shortness of breath. And as needed     budesonide 0.25 MG/2ML nebulizer solution  Commonly known as:  PULMICORT  Take 2 mLs (0.25 mg total) by nebulization 2 (two) times daily. DX: J43.9     cholecalciferol 1000  UNITS tablet  Commonly known as:  VITAMIN D  Take 1,000 Units by mouth daily.     MULTIVITAMIN & MINERAL PO  Take 1 tablet by mouth daily.     potassium chloride SA 20 MEQ tablet  Commonly known as:  K-DUR,KLOR-CON  Take 1 tablet (20 mEq total) by mouth daily.     predniSONE 10 MG tablet  Commonly known as:  DELTASONE  Take two daily x 7days then one daily and stay     ranitidine 300 MG tablet  Commonly known as:  ZANTAC  Take 1 tablet (300 mg total) by mouth at bedtime. 1 tablet at bedtime     rivaroxaban 20 MG Tabs tablet  Commonly known as:  XARELTO  TAKE 1 TABLET BY MOUTH EVERY DAY WITH SUPPER     sodium chloride 0.65 % Soln nasal spray  Commonly known as:  OCEAN  Place 2 sprays into both nostrils as needed for congestion.     verapamil 240 MG CR tablet  Commonly known as:  CALAN-SR  Take 1.5 tablets (360 mg total) by mouth daily.        Allergies:  Allergies  Allergen Reactions  . Incruse Ellipta [Umeclidinium Bromide] Shortness Of Breath  . Tramadol Shortness Of Breath  . Amlodipine  Other reaction(s): SWELLING  . Aspirin     Other reaction(s): PALPITATIONS Other reaction(s): DIFFICULTY BREATHING Heart flutter  . Codeine Other (See Comments)    Hallucinations, can take Tylenol #3 now  . Doxycycline     Other reaction(s): NAUSEA,VOMITING  . Hydrocodone     hallucinations  . Losartan     unknown  . Propoxyphene Nausea Only    Other reaction(s): NAUSEA  . Tiotropium Itching and Rash    Past Medical History, Surgical history, Social history, and Family History were reviewed and updated.  Review of Systems: All other 10 point review of systems is negative.   Physical Exam:  height is 5\' 4"  (1.626 m) and weight is 112 lb (50.803 kg). Holly Page oral temperature is 98.1 F (36.7 C). Holly Page blood pressure is 147/68 and Holly Page pulse is 76. Holly Page respiration is 20.   Wt Readings from Last 3 Encounters:  06/13/15 112 lb (50.803 kg)  05/28/15 112 lb 4 oz (50.916 kg)   05/17/15 112 lb (50.803 kg)    Well-developed and well-nourished [place] American female. Holly Page head and neck exam shows no ocular or oral lesions. She has no palpable cervical or supraclavicular lymph nodes. Lungs are clear. Cardiac exam regular rate and rhythm with no murmurs, rubs or bruits. Abdomen is soft. She has good bowel sounds. There is no fluid wave. There is no palpable liver or spleen tip. Back exam shows no tenderness over the spine, ribs or hips. Extremities shows no swelling of the left arm. There is no venous cord noticed in the left upper arm. There is no palpable venous cords in the legs. She has good range of motion of Holly Page joints. She may have some slight nonpitting edema of the lower legs. Skin exam shows no rashes, ecchymoses or petechia. Lab Results  Component Value Date   WBC 9.0 06/13/2015   HGB 11.8 06/13/2015   HCT 38.6 06/13/2015   MCV 75* 06/13/2015   PLT 290 06/13/2015   Lab Results  Component Value Date   FERRITIN 209.1 12/19/2014   IRON 66 12/21/2014   TIBC 342 12/21/2014   UIBC 276 12/21/2014   IRONPCTSAT 19* 12/21/2014   Lab Results  Component Value Date   RBC 5.16 06/13/2015   No results found for: KPAFRELGTCHN, LAMBDASER, KAPLAMBRATIO No results found for: IGGSERUM, IGA, IGMSERUM No results found for: Odetta Pink, SPEI   Chemistry      Component Value Date/Time   NA 145 04/25/2015 1429   K 4.1 04/25/2015 1429   CL 104 04/25/2015 1429   CO2 32* 04/25/2015 1429   BUN 12 04/25/2015 1429   CREATININE 0.59* 04/25/2015 1429   CREATININE 0.60 01/25/2015 1010      Component Value Date/Time   CALCIUM 9.5 04/25/2015 1429   ALKPHOS 60 04/25/2015 1429   AST 50* 04/25/2015 1429   ALT 24 04/25/2015 1429   BILITOT 0.3 04/25/2015 1429     Impression and Plan: Holly Page is 79 year old African-American female.   For now, I will keep Holly Page on Xarelto. She is on 20 mg a day.  She's doing well  with this.  We did do a thrombophilia panel on Holly Page which was negative.  She has these problems which are not related to Holly Page blood clot. I told Holly Page that Holly Page issues are related to Holly Page lung disease and the fact that she is on quite a few medications. This most significant medication is prednisone. I think because of  Holly Page being on prednisone, she has these other problems which we just cannot help Holly Page with.   I want to see Holly Page back in 3 months. I really think that we'll see Holly Page back, we can consider getting Holly Page off the Xarelto. She had a last blood clot back in the 80s. Maybe, get Holly Page off Xarelto will help Holly Page to feel a little bit better.  I spent about 30 minutes with Holly Page.   Volanda Napoleon, MD 9/14/20161:11 PM

## 2015-06-14 ENCOUNTER — Ambulatory Visit (INDEPENDENT_AMBULATORY_CARE_PROVIDER_SITE_OTHER): Payer: Medicare Other | Admitting: Pulmonary Disease

## 2015-06-14 ENCOUNTER — Encounter: Payer: Self-pay | Admitting: Pulmonary Disease

## 2015-06-14 VITALS — BP 153/82 | HR 74 | Temp 97.7°F | Ht 65.0 in | Wt 111.0 lb

## 2015-06-14 DIAGNOSIS — J9611 Chronic respiratory failure with hypoxia: Secondary | ICD-10-CM

## 2015-06-14 DIAGNOSIS — J439 Emphysema, unspecified: Secondary | ICD-10-CM | POA: Diagnosis not present

## 2015-06-14 MED ORDER — PREDNISONE 5 MG PO TABS: ORAL_TABLET | ORAL | Status: DC

## 2015-06-14 MED ORDER — IPRATROPIUM BROMIDE 0.02 % IN SOLN: 0.5000 mg | Freq: Three times a day (TID) | RESPIRATORY_TRACT | Status: DC

## 2015-06-14 NOTE — Assessment & Plan Note (Signed)
Increase prednisone 5mg  tabs - Take 4 tabs  daily with food x 4 days, then 3 tabs daily x 4 days, then 2 tabs daily x 4 days, then 1 tab daily  - stay at this dose until next visit, if doing well can taper to off   Add atrovent nebs to albuterol thrice daily  Needs flu shot & prevnar but not willing today

## 2015-06-14 NOTE — Progress Notes (Signed)
   Subjective:    Patient ID: Holly Page, female    DOB: Feb 21, 1936, 79 y.o.   MRN: [id number]  HPI 79 y.o. F with Gold D Copd primary emphysema on O2 >GOLD card patient  Quit smoking 04/2012  Hx of DVT -05/2014 (tx w/ xarelto until 12/2014 ) -followed by hematology  Left arm superficial venous thrombus , restarted Xarelto 01/25/15 >followed by Hematology     06/14/2015  Chief Complaint  Patient presents with  . Follow-up    Patient says she is still breathing very heavy, SOB, DOE.  Patient says that her blood clots still have not resolved, they are keeping her on the blood thinners.  Discuss Prednisone. patient says she has been taking 10mg  since April and wants to know when she can stop taking it.   Declined flu shot   C/o persistent dyspnea On pred 10 mg since 11/2014  Could not afford brovana, spiriva caused rash Completed pulm rehab at [facility] Does not want shots today She has been tried on  Incruse and [place] but was unable to tolerate. Was seen in ER on 7/14 for dyspnea  Seen by [place] [facility]ology -Dr. Atilano Median  , patient says does not feel her symptoms are heart related.   Had venous doppler recently of right leg for pain which was neg for DVt.  Remains on Budesonide neb Twice daily  And Albuterol Neb Four times a day  .  Does not want prevnar today , will discuss later.  Refuses flu shot.   Denies chest pain, orthopnea, edema or fever.   Significant tests/ events  CT angio 02/2015 neg, severe emphysema 2015 spirometry - FEV1 at 30%    Review of Systems neg for any significant sore throat, dysphagia, itching, sneezing, nasal congestion or excess/ purulent secretions, fever, chills, sweats, unintended wt loss, pleuritic or exertional cp, hempoptysis, orthopnea pnd or change in chronic leg swelling. Also denies presyncope, palpitations, heartburn, abdominal pain, nausea, vomiting, diarrhea or change in bowel or urinary habits, dysuria,hematuria, rash, arthralgias, visual  complaints, headache, numbness weakness or ataxia.     Objective:   Physical Exam  Gen. Pleasant, thin , in no distress ENT - no lesions, no post nasal drip Neck: No JVD, no thyromegaly, no carotid bruits Lungs: no use of accessory muscles, no dullness to percussion, decreasedles or rhonchi  Cardiovascular: Rhythm regular, heart sounds  normal, no murmurs or gallops, no peripheral edema Musculoskeletal: No deformities, no cyanosis or clubbing        Assessment & Plan:

## 2015-06-14 NOTE — Patient Instructions (Signed)
Increase prednisone 5mg  tabs - Take 4 tabs  daily with food x 4 days, then 3 tabs daily x 4 days, then 2 tabs daily x 4 days, then 1 tab daily  - stay at this dose  Add atrovent nebs to albuterol thrice daily

## 2015-06-14 NOTE — Assessment & Plan Note (Signed)
Check satn on 3L [place] walking

## 2015-06-15 ENCOUNTER — Telehealth: Payer: Self-pay | Admitting: Pulmonary Disease

## 2015-06-15 NOTE — Telephone Encounter (Signed)
Called spoke with pt. She is concerned about starting the ipratropium bc she reports she was told by our office this works similar earlier this year. She also reports TP had sent this in for her in July but cancelled and stated since she was intolerant to spiriva. Pt wants clarification if ipratropium would be safe for her to take. Please advise RA thanks

## 2015-06-15 NOTE — Telephone Encounter (Signed)
Patient notified.  Nothing further needed. 

## 2015-06-15 NOTE — Telephone Encounter (Signed)
Would be safe Does not react like spiriva

## 2015-06-19 ENCOUNTER — Ambulatory Visit: Payer: Medicare Other | Admitting: Family

## 2015-06-20 ENCOUNTER — Telehealth: Payer: Self-pay | Admitting: Family

## 2015-06-20 ENCOUNTER — Ambulatory Visit (INDEPENDENT_AMBULATORY_CARE_PROVIDER_SITE_OTHER): Payer: Medicare Other | Admitting: Family

## 2015-06-20 ENCOUNTER — Encounter: Payer: Self-pay | Admitting: Family

## 2015-06-20 VITALS — BP 138/78 | HR 60 | Temp 98.0°F | Resp 16 | Ht 65.0 in | Wt 113.6 lb

## 2015-06-20 DIAGNOSIS — E876 Hypokalemia: Secondary | ICD-10-CM

## 2015-06-20 DIAGNOSIS — I10 Essential (primary) hypertension: Secondary | ICD-10-CM | POA: Diagnosis not present

## 2015-06-20 DIAGNOSIS — M4807 Spinal stenosis, lumbosacral region: Secondary | ICD-10-CM | POA: Diagnosis not present

## 2015-06-20 LAB — BASIC METABOLIC PANEL
BUN: 15 mg/dL (ref 6–23)
CHLORIDE: 97 meq/L (ref 96–112)
CO2: 40 meq/L — AB (ref 19–32)
CREATININE: 0.62 mg/dL (ref 0.40–1.20)
Calcium: 10 mg/dL (ref 8.4–10.5)
GFR: 120.03 mL/min (ref >60.00)
Glucose, Bld: 97 mg/dL (ref 70–99)
Potassium: 3 mEq/L — ABNORMAL LOW (ref 3.5–5.1)
Sodium: 142 mEq/L (ref 135–145)

## 2015-06-20 MED ORDER — POTASSIUM CHLORIDE CRYS ER 20 MEQ PO TBCR
20.0000 meq | EXTENDED_RELEASE_TABLET | Freq: Every day | ORAL | Status: DC
Start: 2015-06-20 — End: 2016-04-18

## 2015-06-20 NOTE — Patient Instructions (Signed)
Please complete lab work prior to leaving. Follow up in 3-4 months.

## 2015-06-20 NOTE — Telephone Encounter (Signed)
Pt called in returning your call.

## 2015-06-20 NOTE — Progress Notes (Signed)
Pre visit review using our clinic review tool, if applicable. No additional management support is needed unless otherwise documented below in the visit note. 

## 2015-06-20 NOTE — Progress Notes (Signed)
Subjective:    Patient ID: Holly Page, female    DOB: 05-13-1936, 79 y.o.   MRN: [id number]  HPI  [name] is a 79 yr old female who presents today for follow up.  1) HTN- BP Readings from Last 3 Encounters:  06/20/15 138/78  06/14/15 153/82  06/13/15 147/68   2) Leg Pain- "pin prick" sensation in both inner thighs.  Numbness finger left hand.   3) hx Hypokalemia-  Continues potassium supplement and wants to know if she needs to continue Lab Results  Component Value Date   K 4.1 04/25/2015     Review of Systems See HPI  Past Medical History  Diagnosis Date  . Pneumonia   . COPD (chronic obstructive pulmonary disease)   . Hypertension   . DVT (deep venous thrombosis) 1980s, recurrent 2015    Right DVT 2015  on xarelto  . GERD (gastroesophageal reflux disease)   . Lactose intolerance   . Spinal stenosis   . Meningioma     followed by Dr Christella Noa    Social History   Social History  . Marital Status: Divorced    Spouse Name: N/A  . Number of Children: 4  . Years of Education: college   Occupational History  . Retired    Social History Main Topics  . Smoking status: Former Smoker -- 1.50 packs/day for 56 years    Types: Cigarettes    Quit date: 04/29/2012  . Smokeless tobacco: Never Used     Comment: quit smoking 3 years ago  . Alcohol Use: No     Comment: quit drinking beer 2005  . Drug Use: No  . Sexual Activity: Not on file   Other Topics Concern  . Not on file   Social History Narrative   Patient lives at home alone- divorced, no pets.     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 Foot surgery    . Cholecystectomy    . Tonsillectomy    . Tubal ligation      Family History  Problem  Relation Age of Onset  . Lupus Sister   . COPD Father     smoker deceased.   [name] Heart attack Son     Allergies  Allergen Reactions  . Incruse Ellipta [Umeclidinium Bromide] Shortness Of Breath  . Tramadol Shortness Of Breath  . Amlodipine     Other reaction(s): SWELLING  . Aspirin     Other reaction(s): PALPITATIONS Other reaction(s): DIFFICULTY BREATHING Heart flutter  . Codeine Other (See Comments)    Hallucinations, can take Tylenol #3 now  . Doxycycline     Other reaction(s): NAUSEA,VOMITING  . Hydrocodone     hallucinations  . Losartan     unknown  . Propoxyphene Nausea Only    Other reaction(s): NAUSEA  . Tiotropium Itching and Rash    Current Outpatient Prescriptions on File Prior to Visit  Medication Sig Dispense Refill  . acetaminophen-codeine (TYLENOL #3) 300-30 MG per tablet Take 1-2 tablets by mouth every 8 (eight) hours as needed for moderate pain or severe pain. 30 tablet 0  . albuterol (PROVENTIL HFA;VENTOLIN HFA) 108 (90 BASE)  MCG/ACT inhaler Inhale 2 puffs into the lungs every 6 (six) hours as needed for wheezing or shortness of breath. 1 Inhaler 3  . albuterol (PROVENTIL) (2.5 MG/3ML) 0.083% nebulizer solution Take 3 mLs (2.5 mg total) by nebulization every 6 (six) hours as needed for shortness of breath. And as needed 375 mL 2  . budesonide (PULMICORT) 0.25 MG/2ML nebulizer solution Take 2 mLs (0.25 mg total) by nebulization 2 (two) times daily. DX: J43.9 120 mL 12  . cholecalciferol (VITAMIN D) 1000 UNITS tablet Take 1,000 Units by mouth daily.    . Multiple Vitamins-Minerals (MULTIVITAMIN & MINERAL PO) Take 1 tablet by mouth daily.    . potassium chloride SA (K-DUR,KLOR-CON) 20 MEQ tablet Take 1 tablet (20 mEq total) by mouth daily. 30 tablet 1  . predniSONE (DELTASONE) 10 MG tablet Take two daily x 7days then one daily and stay (Patient taking differently: 10 mg daily with breakfast. ) 60 tablet 6  . predniSONE (DELTASONE) 5 MG tablet Take 4 tab daily with  food x 4 days, 3 tabs x 4 days, 2 tabs x 4 days, 1 tab x 4 days, then stay on 5mg  90 tablet 0  . ranitidine (ZANTAC) 300 MG tablet Take 1 tablet (300 mg total) by mouth at bedtime. 1 tablet at bedtime 30 tablet 5  . sodium chloride (OCEAN) 0.65 % SOLN nasal spray Place 2 sprays into both nostrils as needed for congestion.    . verapamil (CALAN-SR) 240 MG CR tablet Take 1.5 tablets (360 mg total) by mouth daily. 45 tablet 5  . XARELTO 20 MG TABS tablet TAKE 1 TABLET BY MOUTH EVERY DAY WITH SUPPER 30 tablet 1  . ipratropium (ATROVENT) 0.02 % nebulizer solution Take 2.5 mLs (0.5 mg total) by nebulization 3 (three) times daily. (Patient not taking: Reported on 06/20/2015) 75 mL 12   No current facility-administered medications on file prior to visit.    BP 138/78 mmHg  Pulse 60  Temp(Src) 98 F (36.7 C) (Oral)  Resp 16  Ht 5\' 5"  (1.651 m)  Wt 113 lb 9.6 oz (51.529 kg)  BMI 18.90 kg/m2  SpO2 98%       Objective:   Physical Exam  Constitutional: She appears well-developed and well-nourished.  Cardiovascular: Normal rate, regular rhythm and normal heart sounds.   No murmur heard. Pulmonary/Chest: Effort normal. No respiratory distress. She has no wheezes.  Diminished breath sounds throughout  Psychiatric: She has a normal mood and affect. Her behavior is normal. Judgment and thought content normal.          Assessment & Plan:  Hypokalemia- potassium is low. Replete, return for follow up K in 1 week, see phone note.

## 2015-06-20 NOTE — Telephone Encounter (Signed)
Left message for pt to return my call.

## 2015-06-20 NOTE — Telephone Encounter (Signed)
K+ is low. Is she taking K dur. rec 2 tabs now, 2 tabs in 12 hours then resume 1 tab by mouth once daily and repeat bmet in 1 week, dx hypokalemia.

## 2015-06-20 NOTE — Telephone Encounter (Signed)
Notified pt. Scheduled lab appt for 06/27/15 at 2:30pm and entered lab order.

## 2015-06-22 NOTE — Assessment & Plan Note (Signed)
BP stable on current meds. Continue same.  

## 2015-06-22 NOTE — Assessment & Plan Note (Signed)
I think that her lumbar disease is likely cause for her symptoms in her thighs. She declines any further work up or intervention for her back.  Monitor.

## 2015-06-27 ENCOUNTER — Encounter (INDEPENDENT_AMBULATORY_CARE_PROVIDER_SITE_OTHER): Payer: Self-pay

## 2015-06-27 ENCOUNTER — Other Ambulatory Visit (INDEPENDENT_AMBULATORY_CARE_PROVIDER_SITE_OTHER): Payer: Medicare Other

## 2015-06-27 DIAGNOSIS — E876 Hypokalemia: Secondary | ICD-10-CM | POA: Diagnosis not present

## 2015-06-28 ENCOUNTER — Telehealth: Payer: Self-pay | Admitting: Family

## 2015-06-28 LAB — BASIC METABOLIC PANEL WITH GFR
BUN: 19 mg/dL (ref 6–23)
CO2: 31 meq/L (ref 19–32)
Calcium: 9.5 mg/dL (ref 8.4–10.5)
Chloride: 101 meq/L (ref 96–112)
Creatinine, Ser: 0.77 mg/dL (ref 0.40–1.20)
GFR: 93.47 mL/min
Glucose, Bld: 110 mg/dL — ABNORMAL HIGH (ref 70–99)
Potassium: 3.9 meq/L (ref 3.5–5.1)
Sodium: 143 meq/L (ref 135–145)

## 2015-06-28 NOTE — Telephone Encounter (Signed)
Patient Name: Holly Page  DOB: 1936-02-24    Initial Comment Caller states she is having blood in her urine, and she takes a blood thinner.   Nurse Assessment  Nurse: Raphael Gibney, RN, Vanita Ingles Date/Time (Eastern Time): 06/28/2015 2:32:17 PM  Confirm and document reason for call. If symptomatic, describe symptoms. ---Caller states she is having blood in her urine. Last Friday and Saturday she saw some blood in her urine. She takes xarelto. She is having lower abd pain on the right side. No pain with urination.  Has the patient traveled out of the country within the last 30 days? ---Not Applicable  Does the patient require triage? ---Yes  Related visit to physician within the last 2 weeks? ---No  Does the PT have any chronic conditions? (i.e. diabetes, asthma, etc.) ---Yes  List chronic conditions. ---blood clot     Guidelines    Guideline Title Affirmed Question Affirmed Notes  Urine - Blood In Taking Coumadin (warfarin) or other strong blood thinner, or known bleeding disorder (e.g., thrombocytopenia)    Final Disposition User   See Physician within 4 Hours (or PCP triage) Raphael Gibney, RN, Vera    Comments  No one at [place] has any openings this afternoon and pt does not want to go to another office. Advised caller that note would be placed on the chart and someone will call her back.   Referrals  REFERRED TO PCP OFFICE   Disagree/Comply: Disagree  Disagree/Comply Reason: Disagree with instructions

## 2015-06-29 ENCOUNTER — Telehealth: Payer: Self-pay | Admitting: Family

## 2015-06-29 ENCOUNTER — Encounter: Payer: Self-pay | Admitting: Family

## 2015-06-29 ENCOUNTER — Ambulatory Visit (INDEPENDENT_AMBULATORY_CARE_PROVIDER_SITE_OTHER): Payer: Medicare Other | Admitting: Family

## 2015-06-29 ENCOUNTER — Other Ambulatory Visit (HOSPITAL_BASED_OUTPATIENT_CLINIC_OR_DEPARTMENT_OTHER): Payer: Medicare Other

## 2015-06-29 VITALS — HR 70 | Temp 98.6°F | Resp 18 | Ht 65.0 in | Wt 113.2 lb

## 2015-06-29 DIAGNOSIS — R3129 Other microscopic hematuria: Secondary | ICD-10-CM

## 2015-06-29 DIAGNOSIS — N3001 Acute cystitis with hematuria: Secondary | ICD-10-CM | POA: Diagnosis not present

## 2015-06-29 DIAGNOSIS — I8393 Asymptomatic varicose veins of bilateral lower extremities: Secondary | ICD-10-CM

## 2015-06-29 DIAGNOSIS — I8390 Asymptomatic varicose veins of unspecified lower extremity: Secondary | ICD-10-CM

## 2015-06-29 DIAGNOSIS — K529 Noninfective gastroenteritis and colitis, unspecified: Secondary | ICD-10-CM | POA: Diagnosis not present

## 2015-06-29 DIAGNOSIS — R102 Pelvic and perineal pain: Secondary | ICD-10-CM | POA: Diagnosis not present

## 2015-06-29 DIAGNOSIS — M549 Dorsalgia, unspecified: Secondary | ICD-10-CM

## 2015-06-29 DIAGNOSIS — R312 Other microscopic hematuria: Secondary | ICD-10-CM | POA: Diagnosis not present

## 2015-06-29 DIAGNOSIS — N390 Urinary tract infection, site not specified: Secondary | ICD-10-CM | POA: Insufficient documentation

## 2015-06-29 LAB — POCT URINALYSIS DIPSTICK
Bilirubin, UA: NEGATIVE
GLUCOSE UA: NEGATIVE
Ketones, UA: NEGATIVE
Leukocytes, UA: NEGATIVE
Nitrite, UA: NEGATIVE
Protein, UA: NEGATIVE
SPEC GRAV UA: 1.015
Urobilinogen, UA: NEGATIVE
pH, UA: 8.5

## 2015-06-29 MED ORDER — CIPROFLOXACIN HCL 250 MG PO TABS: 250.0000 mg | ORAL_TABLET | Freq: Two times a day (BID) | ORAL | Status: DC

## 2015-06-29 MED ORDER — CEPHALEXIN 500 MG PO CAPS: 500.0000 mg | ORAL_CAPSULE | Freq: Two times a day (BID) | ORAL | Status: DC

## 2015-06-29 MED ORDER — ACETAMINOPHEN-CODEINE #3 300-30 MG PO TABS: 1.0000 | ORAL_TABLET | Freq: Three times a day (TID) | ORAL | Status: DC | PRN

## 2015-06-29 NOTE — Progress Notes (Signed)
Pre visit review using our clinic review tool, if applicable. No additional management support is needed unless otherwise documented below in the visit note. 

## 2015-06-29 NOTE — Patient Instructions (Addendum)
Start cipro for urinary tract infection. Call if symptoms worsen or if symptoms are not improved in 3 days.  Please complete your ultrasound on the first floor at 5:30 PM.

## 2015-06-29 NOTE — Progress Notes (Signed)
Subjective:    Patient ID: Holly Page, female    DOB: 11/30/1935, 79 y.o.   MRN: [id number]  Leg Pain     Holly Page is a 79 yr old female who presents today with complaint of lower abdominal pain since yesterday. Has had some pink discolor of the urine.  Denies low back pain. Saw some blood last Friday and Saturday after urination. She has felt hot and at other times has had chills.    Reports some tenderness in her right thigh.  She has noticed that over the last 2 weeks her right varicose vein has been more prominent.  She reports some associated tenderness in the area of the varicose vein.    Reports multiple formed bowel movements a day.  She reports 3-4 BM's a day.  Reports that bm's become progressively softer as the day progresses.  This is a long standing problem which she has had for many years. Reports she has had GI evaluation for this in the past and GI work up has always been normal.  Reports her last colonoscopy was performed 3 years ago by Dr. Cindee Lame and was reportedly normal.  Uses loperamide which helps. She continues to eat dairy products on occasion, doesn't think that this makes her symptoms any worse.    Wt Readings from Last 3 Encounters:  06/29/15 113 lb 3.2 oz (51.347 kg)  06/20/15 113 lb 9.6 oz (51.529 kg)  06/14/15 111 lb (50.349 kg)      Review of Systems   Past Medical History  Diagnosis Date  . Pneumonia   . COPD (chronic obstructive pulmonary disease)   . Hypertension   . DVT (deep venous thrombosis) 1980s, recurrent 2015    Right DVT 2015  on xarelto  . GERD (gastroesophageal reflux disease)   . Lactose intolerance   . Spinal stenosis   . Meningioma     followed by Dr Christella Noa    Social History   Social History  . Marital Status: Divorced    Spouse Name: N/A  . Number of Children: 4  . Years of Education: college   Occupational History  . Retired    Social History Main Topics  . Smoking status: Former Smoker -- 1.50  packs/day for 56 years    Types: Cigarettes    Quit date: 04/29/2012  . Smokeless tobacco: Never Used     Comment: quit smoking 3 years ago  . Alcohol Use: No     Comment: quit drinking beer 2005  . Drug Use: No  . Sexual Activity: Not on file   Other Topics Concern  . Not on file   Social History Narrative   Patient lives at home alone- divorced, no pets.     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 Foot surgery    . Cholecystectomy    . Tonsillectomy    . Tubal ligation      Family History  Problem Relation Age of Onset  . Lupus Sister   . COPD Father     smoker deceased.   [name] Heart attack Son     Allergies  Allergen Reactions  .  Incruse Ellipta [Umeclidinium Bromide] Shortness Of Breath  . Tramadol Shortness Of Breath  . Amlodipine     Other reaction(s): SWELLING  . Aspirin     Other reaction(s): PALPITATIONS Other reaction(s): DIFFICULTY BREATHING Heart flutter  . Codeine Other (See Comments)    Hallucinations, can take Tylenol #3 now  . Doxycycline     Other reaction(s): NAUSEA,VOMITING  . Hydrocodone     hallucinations  . Losartan     unknown  . Propoxyphene Nausea Only    Other reaction(s): NAUSEA  . Tiotropium Itching and Rash    Current Outpatient Prescriptions on File Prior to Visit  Medication Sig Dispense Refill  . acetaminophen-codeine (TYLENOL #3) 300-30 MG per tablet Take 1-2 tablets by mouth every 8 (eight) hours as needed for moderate pain or severe pain. 30 tablet 0  . albuterol (PROVENTIL HFA;VENTOLIN HFA) 108 (90 BASE) MCG/ACT inhaler Inhale 2 puffs into the lungs every 6 (six) hours as needed for wheezing or shortness of breath. 1 Inhaler 3  . albuterol (PROVENTIL) (2.5 MG/3ML) 0.083% nebulizer  solution Take 3 mLs (2.5 mg total) by nebulization every 6 (six) hours as needed for shortness of breath. And as needed 375 mL 2  . budesonide (PULMICORT) 0.25 MG/2ML nebulizer solution Take 2 mLs (0.25 mg total) by nebulization 2 (two) times daily. DX: J43.9 120 mL 12  . cholecalciferol (VITAMIN D) 1000 UNITS tablet Take 1,000 Units by mouth daily.    [name] ipratropium (ATROVENT) 0.02 % nebulizer solution Take 2.5 mLs (0.5 mg total) by nebulization 3 (three) times daily. 75 mL 12  . Multiple Vitamins-Minerals (MULTIVITAMIN & MINERAL PO) Take 1 tablet by mouth daily.    . potassium chloride SA (K-DUR,KLOR-CON) 20 MEQ tablet Take 1 tablet (20 mEq total) by mouth daily. 30 tablet 5  . predniSONE (DELTASONE) 5 MG tablet Take 4 tab daily with food x 4 days, 3 tabs x 4 days, 2 tabs x 4 days, 1 tab x 4 days, then stay on 5mg  90 tablet 0  . ranitidine (ZANTAC) 300 MG tablet Take 1 tablet (300 mg total) by mouth at bedtime. 1 tablet at bedtime 30 tablet 5  . sodium chloride (OCEAN) 0.65 % SOLN nasal spray Place 2 sprays into both nostrils as needed for congestion.    . verapamil (CALAN-SR) 240 MG CR tablet Take 1.5 tablets (360 mg total) by mouth daily. 45 tablet 5  . XARELTO 20 MG TABS tablet TAKE 1 TABLET BY MOUTH EVERY DAY WITH SUPPER 30 tablet 1   No current facility-administered medications on file prior to visit.    Pulse 70  Temp(Src) 98.6 F (37 C) (Oral)  Resp 18  Ht 5\' 5"  (1.651 m)  Wt 113 lb 3.2 oz (51.347 kg)  BMI 18.84 kg/m2  SpO2 100%        Objective:   Physical Exam  Constitutional: She is oriented to person, place, and time. She appears well-developed and well-nourished.  HENT:  Head: Normocephalic and atraumatic.  Cardiovascular: Normal rate, regular rhythm and normal heart sounds.   No murmur heard. Palpable varicose vein right medial thigh  Pulmonary/Chest: Effort normal and breath sounds normal. No respiratory distress. She has no wheezes.  Abdominal: Soft. Bowel sounds  are normal. She exhibits no distension and no mass. There is no tenderness. There is no rebound and no guarding.  Musculoskeletal: She exhibits no edema.  Neurological: She is alert and oriented to person, place, and time.  Skin: Skin is warm and dry.  Psychiatric: She has a normal mood and affect. Her behavior is normal. Judgment and thought content normal.          Assessment & Plan:  UTI with hematuria- will send urine for culture, rx with cipro. Plan repeat UA at her upcoming apt on 10/17.  Varicose vein- Pt has hx of DVT, on xarelto, doubt recurrent DVT, but recommend Venous doppler to be sure. We scheduled doppler for this evening at 5:30 but pt is unwilling to wait or come back today for the test. Would like to complete on Monday instead.    Frequent BM's- suspect due to IBS. Discussed ok to use loperamide prn, but let us know if symptoms worsen. We discussed referral to another GI for another opinion, but she declines at this time.   Declines flu shot, declines prevnar.

## 2015-06-29 NOTE — Telephone Encounter (Signed)
Relation to GE:ZMOQ Call back [place]: [id number] [place], [place] [address]. AT [address]  Reason for call:  Patient states ciprofloxacin (CIPRO) 250 MG tablet has to many side effects and states if you are on predisone dont take. Patient requesting another medication.

## 2015-06-29 NOTE — Telephone Encounter (Signed)
Called to follow up with patient.  Pt states that she continues to experience abd pain, but no blood in urine.  She also says that she has been experiencing chills, sweats, r leg tenderness that's worse in the morning, and tiredness.  Pt was advised to be seen today.  No availability for Holly Page noted.  Offered appointments with another provider or office, pt declined-stating she rather see Holly Page because Holly Page is familiar with her.  Spoke with Holly Page.  Holly Page agreed to see her around 2:00 pm today (06/29/15).  Pt agreed with appointment.  She was advised if symptoms worsen or new symptoms develop to go to ER.  She stated understanding and agreed.

## 2015-07-01 ENCOUNTER — Encounter: Payer: Self-pay | Admitting: Family

## 2015-07-01 LAB — URINE CULTURE
COLONY COUNT: NO GROWTH
Organism ID, Bacteria: NO GROWTH

## 2015-07-02 ENCOUNTER — Other Ambulatory Visit (HOSPITAL_BASED_OUTPATIENT_CLINIC_OR_DEPARTMENT_OTHER): Payer: Medicare Other

## 2015-07-02 NOTE — Telephone Encounter (Signed)
See also mychart message. Pt has been made aware and got new Rx on Saturday.

## 2015-07-05 ENCOUNTER — Ambulatory Visit (INDEPENDENT_AMBULATORY_CARE_PROVIDER_SITE_OTHER): Payer: Medicare Other | Admitting: Adult Health

## 2015-07-05 ENCOUNTER — Ambulatory Visit (HOSPITAL_BASED_OUTPATIENT_CLINIC_OR_DEPARTMENT_OTHER)
Admission: RE | Admit: 2015-07-05 | Discharge: 2015-07-05 | Disposition: A | Discharge status: Home / Self Care | Payer: Medicare Other | Source: Ambulatory Visit | Attending: Family | Admitting: Family

## 2015-07-05 ENCOUNTER — Encounter: Payer: Self-pay | Admitting: Adult Health

## 2015-07-05 VITALS — BP 146/79 | HR 62 | Temp 98.0°F | Ht 66.0 in | Wt 113.0 lb

## 2015-07-05 DIAGNOSIS — J9611 Chronic respiratory failure with hypoxia: Secondary | ICD-10-CM

## 2015-07-05 DIAGNOSIS — M79651 Pain in right thigh: Secondary | ICD-10-CM | POA: Insufficient documentation

## 2015-07-05 DIAGNOSIS — Z86718 Personal history of other venous thrombosis and embolism: Secondary | ICD-10-CM | POA: Diagnosis not present

## 2015-07-05 DIAGNOSIS — I8390 Asymptomatic varicose veins of unspecified lower extremity: Secondary | ICD-10-CM

## 2015-07-05 DIAGNOSIS — J439 Emphysema, unspecified: Secondary | ICD-10-CM | POA: Diagnosis not present

## 2015-07-05 NOTE — Patient Instructions (Addendum)
Decrease prednisone 5mg  daily for 1 week then every other day for 2 weeks and stop  If your breathing worsens , call us .  Continue on Albuterol Neb Four times a day  .  Continue on Pulmicort Neb Twice daily  .  Continue on Oxygen 3l/m with activity and 2l/m at rest .  Follow up 6  weeks as planned  and As needed   Please contact office for sooner follow up if symptoms do not improve or worsen or seek emergency care  Next time you would like to get Prevnar vaccine.

## 2015-07-05 NOTE — Assessment & Plan Note (Signed)
Compensated on present regimen   Plan     Continue on Oxygen 3l/m with activity and 2l/m at rest .  Follow up 6  weeks as planned  and As needed   Please contact office for sooner follow up if symptoms do not improve or worsen or seek emergency care  Next time you would like to get Prevnar vaccine.

## 2015-07-05 NOTE — Assessment & Plan Note (Signed)
Severe COPD compensated on present regimen without flare  She has been on chronic steroids , will try to wean slowly off.   Plan  Decrease prednisone 5mg  daily for 1 week then every other day for 2 weeks and stop  If your breathing worsens , call us .  Continue on Albuterol Neb Four times a day  .  Continue on Pulmicort Neb Twice daily  .  Continue on Oxygen 3l/m with activity and 2l/m at rest .  Follow up 6  weeks as planned  and As needed   Please contact office for sooner follow up if symptoms do not improve or worsen or seek emergency care  Next time you would like to get Prevnar vaccine.

## 2015-07-05 NOTE — Progress Notes (Signed)
Subjective:    Patient ID: Holly Page, female    DOB: 08/26/1936, 79 y.o.   MRN: [id number]  HPI    Review of Systems     Objective:   Physical Exam        Assessment & Plan:      Subjective:    Patient ID: Holly Page, female    DOB: 1936-06-30, 79 y.o.   MRN: [id number]  HPI  79 y.o. F with Gold D Copd primary emphysema on O2 >GOLD card patient  Hx of DVT -05/2014 (tx w/ xarelto until 12/2014 ) -followed by hematology  Left arm superficial venous thrombus , Xarelto 01/25/15 >followed by Hematology    03/01/2015 Follow up : COPD and DVT  Pt returns for 6 week follow up for COPD . Says she is doing very well on her regimen with budesonide and perforomist Was started on Incruse last ov but was not able to tolerate. She was previously intolerant to spiriva as well.  Does complain that insurance is charging her too much for perforomist now.  She was going thru DME company but became too high. Now gets nebs through pharmacy  We called and determined it will be cheaper for Brovana. She is going to try this  May have to cut brovana back to once daily as it will still cost her $ 80 month for brovana and 50-60$ for budesonide (>$100 for perforomist) We also check with [facility] pharmacy as well.  We looked at Medicare B and D filing but unable to get cheaper.  She is going to speak with her pharmacy to see if Medicare B filing would be cheaper.  She denies chest pain, orthopnea, edema , fever or increased albuterol use Last CXR 11/2014 with chronic change  PVX is utd.   She had a thrombo-embolic event in the right inner inferior thigh. This happened in September 2015. She apparently had a DVT back in the 80s. She thinks she was on blood thinner for one month. She was treated with course of Xarelto 05/2014 to 12/2014 with repeat doppler showing no DVT , chronic thickening of the vein wall . She was taken off Xarelto 12/29/14.   01/25/15 she developed left arm pain and swelling,  found to have a superficial venous thrombus in the left arm. She was restarted on Xarelto with starter pack and maintenance rx. .  She says she is doing okay on Xarelto and arm pain and swelling have resolved.   >changed to [facility]    03/15/15 Acute OV : COPD flare  Pt presents for an acute office visit Complains of 1 week of increased dyspnea, DOE , wheezing and tightness.  Got upset at pharmacy b/c they would not fill Brovana.  We called pharmacy and cost is $80 for 30 vials and $160 for 60 vials We discussed using Brovana daily instead of Twice daily  To save money  Advised if breathing declines to alert office. Finances are major issue for pt.  She says she can try to afford $80 . We switched from inhalers to nebs due to high  Of cost with inhalers in past  She has not tolerated tudorza or spiriva in past.  No fever, cough, congestion or edema or hemoptysis .  Last cxr 11/2014 with chronic changes.  Gets anxious easily . Things upset her and then she get more winded.  We discussed relaxation techniques.  On chronic prednisone 10mg  daily  >>increased pred 20mg  daily for 1 wk.  03/26/15  Follow up : COPD  Patient returns for a follow-up for COPD. She says that she feels that her breathing is not getting any better. She has been having difficulty with insurance issues with medication cost. She has been using her Brovana once daily instead of twice daily to help with cost savings. She is on Pulmicort nebulizer twice daily. She remains on prednisone 10 mg daily. Patient was seen with worsening shortness of breath and chest pain last month. She had a CT chest, on June 20 that showed no evidence of pulmonary embolism  and stable. Severe COPD changes. We reviewed her CT results. Last spirometry showed FEV1 at 30% in 2015.  She has been tried on [place] but was unable to tolerate. She denies any chest pain, palpitations, orthopnea, edema, fever or discolored  mucus. Oxygen does drop with pulsing O2 on 2 L was able to keep above 90% on pulsing 3 L. Patient says she has been evaluated by cardiology with a negative stress test recently.  >>increased O2 3l/ act /2 rest   04/19/15  Follow up : ER visit /COPD /Resp Failure on O2  Pt returns for 2 week follow up .  Was seen in ER on 7/14 for dyspnea  CXR w/ no acute process.  She was recommended by our office to stop Brovana and begin albuterol neb Four times a day  Along  With her budesonide Twice daily  .  She does feel this is working better.  No as sob .  Has also been seeing cardiology with planned stress test next week.  Denies fever, orthopnea, edema or hemoptysis  >>refer to cardiology , continue on budesonide and albuterol   05/17/2015 Follow up : COPD and Resp failure on O2  Pt returns for 1 month follow up for COPD .  Last ov with ongoing dyspnea and chest tightness, refer to cards Seen by [facility] cardiology -Dr. Atilano Median  , patient says does not feel her symptoms are heart related.   Had venous doppler recently of right leg for pain which was neg for DVt.   Remains on Budesonide neb Twice daily  And Albuterol Neb Four times a day  .  Remains on prednisone 10mg  daily .  Remains on O2 2l/m rest and 3l/m with act .  PVX utd. Does not want prevnar today , will discuss later.  Refuses flu shot.   Denies chest pain, orthopnea, edema or fever.   07/05/2015 Follow up: COPD /O2 dependent  Pt returns for follow up for COPD .  Remains on Budesonide neb Twice daily  And Albuterol Neb Four times a day  .  Remains on prednisone 5mg  . She wants to try to wean completely off.   Remains on O2 2l/m rest and 3l/m with act .  PVX utd. Does not want prevnar today , would like to get at next ov.   Refuses flu shot.  She is having trouble with recurrent hematuria . She has an appointment with urology next week.  She is frustrated with all her medical problems and feels over whelmed.   She denies chest  pain, orthopnea, edema or fever.        Review of Systems  Constitutional:   No  weight loss, night sweats,  Fevers, chills, + fatigue, or  lassitude.  HEENT:   No headaches,  Difficulty swallowing,  Tooth/dental problems, or  Sore throat,  No sneezing, itching, ear ache, nasal congestion, post nasal drip,   CV:  No chest pain,  Orthopnea, PND, swelling in lower extremities, anasarca, dizziness, palpitations, syncope.   GI  Notes heartburn, no indigestion, abdominal pain, nausea, vomiting, diarrhea, change in bowel habits, loss of appetite, bloody stools.   Resp:  .    No chest wall deformity  Skin: no rash or lesions.  GU: no dysuria, change in color of urine, no urgency or frequency.  No flank pain, no hematuria   MS:  No joint pain or swelling.  No decreased range of motion.  No back pain.  Psych:  No change in mood or affect.+anxiety.  No memory loss.      Objective:   Physical Exam  GEN: A/Ox3; pleasant , NAD, elderly and frail   HEENT:  [place]/AT,  EACs-clear, TMs-wnl, NOSE-clear, THROAT-clear, no lesions  NECK:  Supple w/ fair ROM; no JVD; normal carotid impulses w/o bruits; no thyromegaly or nodules palpated; no lymphadenopathy.  RESP  Diminished in bases w/ no wheezing or rhonchi.no accessory muscle use, no dullness to percussion  CARD:  RRR, no m/r/g  , no peripheral edema, pulses intact, no cyanosis or clubbing.  GI:   Soft & nt; nml bowel sounds; no organomegaly or masses detected.  Musco: Warm bil, no deformities or joint swelling noted.   Neuro: alert, no focal deficits noted.    Skin: Warm, no lesions or rashes .       Assessment & Plan:

## 2015-07-10 NOTE — Progress Notes (Signed)
Reviewed & agree with plan  

## 2015-07-16 ENCOUNTER — Encounter: Payer: Self-pay | Admitting: Family

## 2015-07-16 ENCOUNTER — Ambulatory Visit (INDEPENDENT_AMBULATORY_CARE_PROVIDER_SITE_OTHER): Payer: Medicare Other | Admitting: Family

## 2015-07-16 VITALS — BP 129/63 | HR 63 | Temp 98.3°F | Ht 66.0 in | Wt 113.6 lb

## 2015-07-16 DIAGNOSIS — R319 Hematuria, unspecified: Secondary | ICD-10-CM | POA: Diagnosis not present

## 2015-07-16 DIAGNOSIS — M549 Dorsalgia, unspecified: Secondary | ICD-10-CM

## 2015-07-16 DIAGNOSIS — R61 Generalized hyperhidrosis: Secondary | ICD-10-CM

## 2015-07-16 DIAGNOSIS — IMO0001 Reserved for inherently not codable concepts without codable children: Secondary | ICD-10-CM

## 2015-07-16 DIAGNOSIS — R350 Frequency of micturition: Secondary | ICD-10-CM | POA: Diagnosis not present

## 2015-07-16 LAB — CBC WITH DIFFERENTIAL/PLATELET
BASOS ABS: 0 10*3/uL (ref 0.0–0.1)
Basophils Relative: 0.2 % (ref 0.0–3.0)
EOS PCT: 0.4 % (ref 0.0–5.0)
Eosinophils Absolute: 0 10*3/uL (ref 0.0–0.7)
HEMATOCRIT: 38.6 % (ref 36.0–46.0)
Hemoglobin: 11.8 g/dL — ABNORMAL LOW (ref 12.0–15.0)
LYMPHS ABS: 1.5 10*3/uL (ref 0.7–4.0)
LYMPHS PCT: 22.4 % (ref 12.0–46.0)
MCHC: 30.6 g/dL (ref 30.0–36.0)
MCV: 75 fl — AB (ref 78.0–100.0)
MONOS PCT: 4.2 % (ref 3.0–12.0)
Monocytes Absolute: 0.3 10*3/uL (ref 0.1–1.0)
NEUTROS ABS: 5 10*3/uL (ref 1.4–7.7)
NEUTROS PCT: 72.8 % (ref 43.0–77.0)
Platelets: 283 10*3/uL (ref 150.0–400.0)
RBC: 5.15 Mil/uL — AB (ref 3.87–5.11)
RDW: 16 % — ABNORMAL HIGH (ref 11.5–15.5)
WBC: 6.8 10*3/uL (ref 4.0–10.5)

## 2015-07-16 LAB — TSH: TSH: 1.22 u[IU]/mL (ref 0.35–4.50)

## 2015-07-16 MED ORDER — ACETAMINOPHEN-CODEINE #3 300-30 MG PO TABS: 1.0000 | ORAL_TABLET | Freq: Three times a day (TID) | ORAL | Status: DC | PRN

## 2015-07-16 NOTE — Progress Notes (Signed)
Pre visit review using our clinic review tool, if applicable. No additional management support is needed unless otherwise documented below in the visit note. 

## 2015-07-16 NOTE — Progress Notes (Signed)
Subjective:    Patient ID: Holly Page, female    DOB: 1936/08/08, 79 y.o.   MRN: [id number]  HPI  Ms.  Page is a 79 yr old female who presents today with several concerns:  1) Excessive sweating- reports episodes of sweating throughout the day and night. Then she becomes chilled.  She denies known fever.  She reports that this has been going on for a "while."    2) Abdominal pain- present x 3-4 months. Pain in the suprapubic aching. Nocturia x 3.  + Dysuria.  She saw Holly Page who will do a CT scan. She is seeing him for hematuria.   3) Right thigh pain- reports "prickling pain" in her right upper thigh.  MRI of the lumbar spine showed severe spinal stenosis of the lumbar spine and L paracentral disc protrusion/suprring L4-5 with severe spinal stenosis/compression of the L L5 nerve route.  Lab Results  Component Value Date   TSH 1.350 02/02/2014    Review of Systems See HPI  Past Medical History  Diagnosis Date  . Pneumonia   . COPD (chronic obstructive pulmonary disease) ([place])   . Hypertension   . DVT (deep venous thrombosis) ([place]) 1980s, recurrent 2015    Right DVT 2015  on xarelto  . GERD (gastroesophageal reflux disease)   . Lactose intolerance   . Spinal stenosis   . Meningioma [facility])     followed by Holly Page    Social History   Social History  . Marital Status: Divorced    Spouse Name: N/A  . Number of Children: 4  . Years of Education: college   Occupational History  . Retired    Social History Main Topics  . Smoking status: Former Smoker -- 1.50 packs/day for 56 years    Types: Cigarettes    Quit date: 04/29/2012  . Smokeless tobacco: Never Used     Comment: quit smoking 3 years ago  . Alcohol Use: No     Comment: quit drinking beer 2005  . Drug Use: No  . Sexual Activity: Not on file   Other Topics Concern  . Not on file   Social History Narrative   Patient lives at home alone- divorced, no pets.     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 Foot surgery    . Cholecystectomy    . Tonsillectomy    . Tubal ligation      Family History  Problem Relation Age of Onset  . Lupus Sister   . COPD Father     smoker deceased.   [name] Heart attack Son     Allergies  Allergen Reactions  . Incruse Ellipta [Umeclidinium Bromide] Shortness Of Breath  . Tramadol Shortness Of Breath  . Amlodipine     Other reaction(s): SWELLING  . Aspirin     Other reaction(s): PALPITATIONS Other reaction(s): DIFFICULTY BREATHING Heart flutter  . Codeine Other (See Comments)    Hallucinations, can take Tylenol #3 now  . Doxycycline     Other reaction(s): NAUSEA,VOMITING  . Hydrocodone     hallucinations  . Losartan     unknown  . Propoxyphene Nausea  Only    Other reaction(s): NAUSEA  . Tiotropium Itching and Rash    Current Outpatient Prescriptions on File Prior to Visit  Medication Sig Dispense Refill  . albuterol (PROVENTIL HFA;VENTOLIN HFA) 108 (90 BASE) MCG/ACT inhaler Inhale 2 puffs into the lungs every 6 (six) hours as needed for wheezing or shortness of breath. 1 Inhaler 3  . albuterol (PROVENTIL) (2.5 MG/3ML) 0.083% nebulizer solution Take 3 mLs (2.5 mg total) by nebulization every 6 (six) hours as needed for shortness of breath. And as needed 375 mL 2  . budesonide (PULMICORT) 0.25 MG/2ML nebulizer solution Take 2 mLs (0.25 mg total) by nebulization 2 (two) times daily. DX: J43.9 120 mL 12  . cholecalciferol (VITAMIN D) 1000 UNITS tablet Take 1,000 Units by mouth daily.    . Multiple Vitamins-Minerals (MULTIVITAMIN & MINERAL PO) Take 1 tablet by mouth daily.    . potassium chloride SA (K-DUR,KLOR-CON) 20 MEQ tablet Take 1 tablet (20 mEq total) by mouth daily. 30 tablet 5  .  predniSONE (DELTASONE) 5 MG tablet Take 4 tab daily with food x 4 days, 3 tabs x 4 days, 2 tabs x 4 days, 1 tab x 4 days, then stay on 5mg  90 tablet 0  . ranitidine (ZANTAC) 300 MG tablet Take 1 tablet (300 mg total) by mouth at bedtime. 1 tablet at bedtime 30 tablet 5  . sodium chloride (OCEAN) 0.65 % SOLN nasal spray Place 2 sprays into both nostrils as needed for congestion.    . verapamil (CALAN-SR) 240 MG CR tablet Take 1.5 tablets (360 mg total) by mouth daily. 45 tablet 5  . XARELTO 20 MG TABS tablet TAKE 1 TABLET BY MOUTH EVERY DAY WITH SUPPER 30 tablet 1   No current facility-administered medications on file prior to visit.    BP 129/63 mmHg  Pulse 63  Temp(Src) 98.3 F (36.8 C) (Oral)  Ht 5\' 6"  (1.676 m)  Wt 113 lb 9.6 oz (51.529 kg)  BMI 18.34 kg/m2  SpO2 100%       Objective:   Physical Exam  Constitutional: She is oriented to person, place, and time. She appears well-developed and well-nourished.  HENT:  Head: Normocephalic and atraumatic.  Cardiovascular: Normal rate, regular rhythm and normal heart sounds.   No murmur heard. Pulmonary/Chest: Effort normal and breath sounds normal. No respiratory distress. She has no wheezes.  Musculoskeletal: She exhibits no edema.  Neurological: She is alert and oriented to person, place, and time.  Psychiatric: She has a normal mood and affect. Her behavior is normal. Judgment and thought content normal.          Assessment & Plan:  Hematuria/abdominal pain- culture grew 15 K colonies.  She is advised to keep her upcoming appointment with urology for hematuria work up. Cbc shows mild microcytic anemia which is at baseline.   Back pain with radiation- this is likely cause for pt's complaint of thigh pain.  She declines further work up or intervention for her back problems at this time. I think this is reasonable given that she would not be a good surgical candidate.

## 2015-07-16 NOTE — Patient Instructions (Signed)
Please complete lab work prior to leaving.   

## 2015-07-17 LAB — URINE CULTURE

## 2015-07-18 ENCOUNTER — Encounter: Payer: Self-pay | Admitting: Family

## 2015-07-20 ENCOUNTER — Telehealth: Payer: Self-pay

## 2015-07-20 NOTE — Telephone Encounter (Signed)
Patient called wanting to know about her labs that say she some bacteria she wants to know if she needs to take the medication that was giving to her for this. Please call patient with instructions

## 2015-07-20 NOTE — Telephone Encounter (Signed)
labs     From   Debbrah Alar, NP    To   LEIANNA BARGA    Sent and Delivered   07/18/2015 9:21 PM       Last Read in [place]   07/20/2015 11:22 AM by Arrie Eastern       Ms. Schmall,   Your thyroid function looks good. Urine did not grow a significant amount of bacteria.   Melissa        Pt notified and made aware of the same.  She was also advised that she does not need to take any medication for the insignificant growth of bacteria in her urine. Pt stated understanding. No further questions or concerns voiced at this time.

## 2015-08-01 ENCOUNTER — Telehealth: Payer: Self-pay | Admitting: Family

## 2015-08-01 NOTE — Telephone Encounter (Signed)
Pt has  [employer].

## 2015-08-01 NOTE — Telephone Encounter (Signed)
Patient would like to transfer from [facility] to Dr. Larose Kells patient states she feels like she needs an internist, please advise.

## 2015-08-01 NOTE — Telephone Encounter (Signed)
OK with me.

## 2015-08-02 NOTE — Telephone Encounter (Signed)
Patient calling back to checking on the status if Dr. Larose Kells will take her on as a new patient please advise, patient states if Dr. Larose Kells cant taker her on can her refer her to an internist.

## 2015-08-03 NOTE — Telephone Encounter (Signed)
I won't be able to take her. Let the patient known

## 2015-08-06 NOTE — Telephone Encounter (Signed)
Patient made aware and scheduled a new patient appointment with a [facility] internist in January

## 2015-08-07 ENCOUNTER — Encounter: Payer: Self-pay | Admitting: Hematology & Oncology

## 2015-08-07 ENCOUNTER — Ambulatory Visit (HOSPITAL_BASED_OUTPATIENT_CLINIC_OR_DEPARTMENT_OTHER): Payer: Medicare Other | Admitting: Hematology & Oncology

## 2015-08-07 ENCOUNTER — Other Ambulatory Visit (HOSPITAL_BASED_OUTPATIENT_CLINIC_OR_DEPARTMENT_OTHER): Payer: Medicare Other

## 2015-08-07 VITALS — BP 127/66 | HR 79 | Temp 98.0°F | Resp 18 | Ht 66.0 in | Wt 111.0 lb

## 2015-08-07 DIAGNOSIS — J449 Chronic obstructive pulmonary disease, unspecified: Secondary | ICD-10-CM

## 2015-08-07 DIAGNOSIS — I809 Phlebitis and thrombophlebitis of unspecified site: Secondary | ICD-10-CM | POA: Diagnosis not present

## 2015-08-07 DIAGNOSIS — Z9981 Dependence on supplemental oxygen: Secondary | ICD-10-CM

## 2015-08-07 DIAGNOSIS — D500 Iron deficiency anemia secondary to blood loss (chronic): Secondary | ICD-10-CM

## 2015-08-07 DIAGNOSIS — I82402 Acute embolism and thrombosis of unspecified deep veins of left lower extremity: Secondary | ICD-10-CM

## 2015-08-07 DIAGNOSIS — Z86718 Personal history of other venous thrombosis and embolism: Secondary | ICD-10-CM | POA: Diagnosis not present

## 2015-08-07 LAB — COMPREHENSIVE METABOLIC PANEL (CC13)
ALT: 22 U/L (ref 0–55)
AST: 55 U/L — AB (ref 5–34)
Albumin: 3.9 g/dL (ref 3.5–5.0)
Alkaline Phosphatase: 74 U/L (ref 40–150)
Anion Gap: 9 mEq/L (ref 3–11)
BUN: 7.6 mg/dL (ref 7.0–26.0)
CHLORIDE: 100 meq/L (ref 98–109)
CO2: 36 meq/L — AB (ref 22–29)
CREATININE: 0.7 mg/dL (ref 0.6–1.1)
Calcium: 9.8 mg/dL (ref 8.4–10.4)
EGFR: >90 mL/min/{1.73_m2} (ref >90)
Glucose: 116 mg/dl (ref 70–140)
Potassium: 3.4 mEq/L — ABNORMAL LOW (ref 3.5–5.1)
Sodium: 145 mEq/L (ref 136–145)
Total Bilirubin: 0.35 mg/dL (ref 0.20–1.20)
Total Protein: 6.8 g/dL (ref 6.4–8.3)

## 2015-08-07 LAB — CBC WITH DIFFERENTIAL (CANCER CENTER ONLY)
BASO#: 0 10*3/uL (ref 0.0–0.2)
BASO%: 0.3 % (ref 0.0–2.0)
EOS ABS: 0.1 10*3/uL (ref 0.0–0.5)
EOS%: 1.5 % (ref 0.0–7.0)
HCT: 38.2 % (ref 34.8–46.6)
HGB: 11.3 g/dL — ABNORMAL LOW (ref 11.6–15.9)
LYMPH#: 2.7 10*3/uL (ref 0.9–3.3)
LYMPH%: 44.6 % (ref 14.0–48.0)
MCH: 22.8 pg — AB (ref 26.0–34.0)
MCHC: 29.6 g/dL — AB (ref 32.0–36.0)
MCV: 77 fL — AB (ref 81–101)
MONO#: 0.8 10*3/uL (ref 0.1–0.9)
MONO%: 13.6 % — AB (ref 0.0–13.0)
NEUT#: 2.4 10*3/uL (ref 1.5–6.5)
NEUT%: 40 % (ref 39.6–80.0)
PLATELETS: 295 10*3/uL (ref 145–400)
RBC: 4.95 10*6/uL (ref 3.70–5.32)
RDW: 15.3 % (ref 11.1–15.7)
WBC: 6 10*3/uL (ref 3.9–10.0)

## 2015-08-07 MED ORDER — FUSION 65-65-25-30 MG PO CAPS: 1.0000 | ORAL_CAPSULE | Freq: Every day | ORAL | Status: DC

## 2015-08-07 NOTE — Progress Notes (Signed)
Hematology and Oncology Follow Up Visit  Holly Page [id number] Mar 06, 1936 79 y.o. 08/07/2015   Principle Diagnosis:  Thromboembolic disease of the right thigh Superficial thrombophlebitis of the left arm  Current Therapy:   Xarelto 20 mg by mouth daily -discontinued today  Interim History:  Holly Page is here today for follow-up. . She still has a lot of medical issues. It is hard to say what is the problem. She complains of urinary blood. She complains of blood in the toilet. She probably has kidney stones. I'm not sure if she is seeing a urologist.  She's had no problems with her arms or legs.  She has lost a little weight. She is dealing with COPD. She is oxygen dependent. For some reason, she took herself off prednisone.  I talked to her at length about stopping the Xarelto. She does not have any underlying thrombophilic issues.  I think it be worthwhile stopping the Xarelto. She's been on Xarelto now for over 6 months. This is for a superficial thrombus in the left arm. On repeat Doppler after being on Xarelto, there is no evidence of any thrombus in the left arm. This was back in the summertime.  Her appetite is doing pretty well.  She's had no hemoptysis. She has chronic shortness of breath. Again, she is oxygen dependent.  Overall, her performance status is ECOG 2. .   Medications:    Medication List       This list is accurate as of: 08/07/15  9:59 AM.  Always use your most recent med list.               acetaminophen-codeine 300-30 MG tablet  Commonly known as:  TYLENOL #3  Take 1-2 tablets by mouth every 8 (eight) hours as needed for moderate pain or severe pain.     albuterol 108 (90 BASE) MCG/ACT inhaler  Commonly known as:  PROVENTIL HFA;VENTOLIN HFA  Inhale 2 puffs into the lungs every 6 (six) hours as needed for wheezing or shortness of breath.     albuterol (2.5 MG/3ML) 0.083% nebulizer solution  Commonly known as:  PROVENTIL  Take 3 mLs  (2.5 mg total) by nebulization every 6 (six) hours as needed for shortness of breath. And as needed     budesonide 0.25 MG/2ML nebulizer solution  Commonly known as:  PULMICORT  Take 2 mLs (0.25 mg total) by nebulization 2 (two) times daily. DX: J43.9     cholecalciferol 1000 UNITS tablet  Commonly known as:  VITAMIN D  Take 1,000 Units by mouth daily.     MULTIVITAMIN & MINERAL PO  Take 1 tablet by mouth daily.     potassium chloride SA 20 MEQ tablet  Commonly known as:  K-DUR,KLOR-CON  Take 1 tablet (20 mEq total) by mouth daily.     predniSONE 5 MG tablet  Commonly known as:  DELTASONE  Take 4 tab daily with food x 4 days, 3 tabs x 4 days, 2 tabs x 4 days, 1 tab x 4 days, then stay on 5mg      ranitidine 300 MG tablet  Commonly known as:  ZANTAC  Take 1 tablet (300 mg total) by mouth at bedtime. 1 tablet at bedtime     sodium chloride 0.65 % Soln nasal spray  Commonly known as:  OCEAN  Place 2 sprays into both nostrils as needed for congestion.     verapamil 240 MG CR tablet  Commonly known as:  CALAN-SR  Take 1.5 tablets (  360 mg total) by mouth daily.        Allergies:  Allergies  Allergen Reactions  . Incruse Ellipta [Umeclidinium Bromide] Shortness Of Breath  . Tramadol Shortness Of Breath  . Amlodipine     Other reaction(s): SWELLING  . Aspirin     Other reaction(s): PALPITATIONS Other reaction(s): DIFFICULTY BREATHING Heart flutter  . Codeine Other (See Comments)    Hallucinations, can take Tylenol #3 now  . Doxycycline     Other reaction(s): NAUSEA,VOMITING  . Hydrocodone     hallucinations  . Losartan     unknown  . Propoxyphene Nausea Only    Other reaction(s): NAUSEA  . Tiotropium Itching and Rash    Past Medical History, Surgical history, Social history, and Family History were reviewed and updated.  Review of Systems: All other 10 point review of systems is negative.   Physical Exam:  height is 5\' 6"  (1.676 m) and weight is 111 lb (50.349  kg). Her oral temperature is 98 F (36.7 C). Her blood pressure is 127/66 and her pulse is 79. Her respiration is 18.   Wt Readings from Last 3 Encounters:  08/07/15 111 lb (50.349 kg)  07/16/15 113 lb 9.6 oz (51.529 kg)  07/05/15 113 lb (51.256 kg)    Well-developed and well-nourished [place] American female. Her head and neck exam shows no ocular or oral lesions. She has no palpable cervical or supraclavicular lymph nodes. Lungs are clear. Cardiac exam regular rate and rhythm with no murmurs, rubs or bruits. Abdomen is soft. She has good bowel sounds. There is no fluid wave. There is no palpable liver or spleen tip. Back exam shows no tenderness over the spine, ribs or hips. Extremities shows no swelling of the left arm. There is no venous cord noticed in the left upper arm. There is no palpable venous cords in the legs. She has good range of motion of her joints. She may have some slight nonpitting edema of the lower legs. Skin exam shows no rashes, ecchymoses or petechia. Lab Results  Component Value Date   WBC 6.0 08/07/2015   HGB 11.3* 08/07/2015   HCT 38.2 08/07/2015   MCV 77* 08/07/2015   PLT 295 08/07/2015   Lab Results  Component Value Date   FERRITIN 209.1 12/19/2014   IRON 66 12/21/2014   TIBC 342 12/21/2014   UIBC 276 12/21/2014   IRONPCTSAT 19* 12/21/2014   Lab Results  Component Value Date   RBC 4.95 08/07/2015   No results found for: KPAFRELGTCHN, LAMBDASER, KAPLAMBRATIO No results found for: IGGSERUM, IGA, IGMSERUM No results found for: Odetta Pink, SPEI   Chemistry      Component Value Date/Time   NA 143 06/27/2015 1430   K 3.9 06/27/2015 1430   CL 101 06/27/2015 1430   CO2 31 06/27/2015 1430   BUN 19 06/27/2015 1430   CREATININE 0.77 06/27/2015 1430   CREATININE 0.60 01/25/2015 1010      Component Value Date/Time   CALCIUM 9.5 06/27/2015 1430   ALKPHOS 60 04/25/2015 1429   AST 50* 04/25/2015 1429     ALT 24 04/25/2015 1429   BILITOT 0.3 04/25/2015 1429     Impression and Plan: Holly Page is 79 year old African-American female. She has a remote history of thrombus in the right thigh. This is back in the 80s.  Then she had a superficial thrombus in the left arm back in April.  She is on Xarelto. I really do  not think that she requires lifelong Xarelto.  I really have a hard time figuring out what her problems are. His heart is tell if she is having hematuria. His [place] she is having hematochezia.  I think that is be very reasonable to stop the Xarelto right now. This could be complicating any type of bleeding.  She's losing a little weight. I really think this is from her underlying COPD. I still believe that the underlying COPD Will be the disease that will eventually cause her demise. I think she'll continue to lose some weight.  I want to get her back to see [place] in another 2 months or so. We see her back, we will repeat some Dopplers just for some reassurance.  She is very worried about stopping her blood thinner but I told her that I does don't think that having a superficial thrombus warrants lifelong anticoagulation..  I spent about 30 minutes with her.   Volanda Napoleon, MD 11/8/20169:59 AM

## 2015-08-08 ENCOUNTER — Telehealth: Payer: Self-pay | Admitting: *Deleted

## 2015-08-08 LAB — VITAMIN D 25 HYDROXY (VIT D DEFICIENCY, FRACTURES): Vit D, 25-Hydroxy: 37 ng/mL (ref 30–100)

## 2015-08-08 NOTE — Telephone Encounter (Signed)
Patient wanting to make sure Dr Marin Olp instructed her to stop the Xarelto. Confirmed with Dr Marin Olp and patient is to stop xarelto. She is aware.

## 2015-08-09 ENCOUNTER — Ambulatory Visit (INDEPENDENT_AMBULATORY_CARE_PROVIDER_SITE_OTHER): Payer: Medicare Other | Admitting: Adult Health

## 2015-08-09 ENCOUNTER — Ambulatory Visit (INDEPENDENT_AMBULATORY_CARE_PROVIDER_SITE_OTHER): Payer: Medicare Other | Admitting: Family

## 2015-08-09 ENCOUNTER — Encounter: Payer: Self-pay | Admitting: Family

## 2015-08-09 ENCOUNTER — Encounter: Payer: Self-pay | Admitting: Adult Health

## 2015-08-09 VITALS — BP 145/70 | HR 77 | Temp 98.3°F | Resp 20 | Ht 66.0 in | Wt 109.6 lb

## 2015-08-09 VITALS — BP 122/76 | HR 78 | Temp 97.9°F | Ht 61.0 in | Wt 110.0 lb

## 2015-08-09 DIAGNOSIS — J309 Allergic rhinitis, unspecified: Secondary | ICD-10-CM

## 2015-08-09 DIAGNOSIS — Z23 Encounter for immunization: Secondary | ICD-10-CM

## 2015-08-09 DIAGNOSIS — J9611 Chronic respiratory failure with hypoxia: Secondary | ICD-10-CM

## 2015-08-09 DIAGNOSIS — R51 Headache: Secondary | ICD-10-CM

## 2015-08-09 DIAGNOSIS — J439 Emphysema, unspecified: Secondary | ICD-10-CM | POA: Diagnosis not present

## 2015-08-09 DIAGNOSIS — Z1239 Encounter for other screening for malignant neoplasm of breast: Secondary | ICD-10-CM

## 2015-08-09 DIAGNOSIS — Z1231 Encounter for screening mammogram for malignant neoplasm of breast: Secondary | ICD-10-CM

## 2015-08-09 DIAGNOSIS — R519 Headache, unspecified: Secondary | ICD-10-CM

## 2015-08-09 MED ORDER — LORATADINE 10 MG PO TABS: 10.0000 mg | ORAL_TABLET | Freq: Every day | ORAL | Status: DC

## 2015-08-09 NOTE — Progress Notes (Signed)
Subjective:    Patient ID: Holly Page, female    DOB: August 09, 1936, 79 y.o.   MRN: [id number]  HPI  Holly Page is a 79 yr old female who presents today with chief complaint of headache. Reports that HA is located in the front of her head and occurs when she lays down at night.  Does not hurt when she bends forward.  Describes as a throbbing pain.  Denies associated vision changes.  Has had some nausea without vomiting.    Still has some pain which radiates into  the right lower abdomen with walking only. Review of Systems See HPI  Past Medical History  Diagnosis Date  . Pneumonia   . COPD (chronic obstructive pulmonary disease) ([place])   . Hypertension   . DVT (deep venous thrombosis) ([place]) 1980s, recurrent 2015    Right DVT 2015  on xarelto  . GERD (gastroesophageal reflux disease)   . Lactose intolerance   . Spinal stenosis   . Meningioma [facility])     followed by Dr Christella Noa    Social History   Social History  . Marital Status: Divorced    Spouse Name: N/A  . Number of Children: 4  . Years of Education: college   Occupational History  . Retired    Social History Main Topics  . Smoking status: Former Smoker -- 1.50 packs/day for 56 years    Types: Cigarettes    Quit date: 04/29/2012  . Smokeless tobacco: Never Used     Comment: quit smoking 3 years ago  . Alcohol Use: No     Comment: quit drinking beer 2005  . Drug Use: No  . Sexual Activity: Not on file   Other Topics Concern  . Not on file   Social History Narrative   Patient lives at home alone- divorced, no pets.     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 Foot surgery    . Cholecystectomy    . Tonsillectomy    . Tubal ligation      Family History    Problem Relation Age of Onset  . Lupus Sister   . COPD Father     smoker deceased.   [name] Heart attack Son     Allergies  Allergen Reactions  . Incruse Ellipta [Umeclidinium Bromide] Shortness Of Breath  . Tramadol Shortness Of Breath  . Amlodipine     Other reaction(s): SWELLING  . Aspirin     Other reaction(s): PALPITATIONS Other reaction(s): DIFFICULTY BREATHING Heart flutter  . Codeine Other (See Comments)    Hallucinations, can take Tylenol #3 now  . Doxycycline     Other reaction(s): NAUSEA,VOMITING  . Hydrocodone     hallucinations  . Losartan     unknown  . Propoxyphene Nausea Only    Other reaction(s): NAUSEA  . Tiotropium Itching and Rash    Current Outpatient Prescriptions on File Prior to Visit  Medication Sig Dispense Refill  . acetaminophen-codeine (TYLENOL #3) 300-30 MG tablet Take 1-2 tablets by mouth every 8 (eight) hours as needed for moderate pain or severe pain. 60 tablet 0  . albuterol (PROVENTIL HFA;VENTOLIN HFA) 108 (90 BASE)  MCG/ACT inhaler Inhale 2 puffs into the lungs every 6 (six) hours as needed for wheezing or shortness of breath. 1 Inhaler 3  . albuterol (PROVENTIL) (2.5 MG/3ML) 0.083% nebulizer solution Take 3 mLs (2.5 mg total) by nebulization every 6 (six) hours as needed for shortness of breath. And as needed 375 mL 2  . budesonide (PULMICORT) 0.25 MG/2ML nebulizer solution Take 2 mLs (0.25 mg total) by nebulization 2 (two) times daily. DX: J43.9 120 mL 12  . cholecalciferol (VITAMIN D) 1000 UNITS tablet Take 1,000 Units by mouth daily.    . Fe Fum-Fe Poly-Vit C-Lactobac (FUSION) 65-65-25-30 MG CAPS Take 1 tablet by mouth daily. 30 capsule 6  . Multiple Vitamins-Minerals (MULTIVITAMIN & MINERAL PO) Take 1 tablet by mouth daily.    . potassium chloride SA (K-DUR,KLOR-CON) 20 MEQ tablet Take 1 tablet (20 mEq total) by mouth daily. 30 tablet 5  . ranitidine (ZANTAC) 300 MG tablet Take 1 tablet (300 mg total) by mouth at bedtime. 1 tablet at  bedtime 30 tablet 5  . sodium chloride (OCEAN) 0.65 % SOLN nasal spray Place 2 sprays into both nostrils as needed for congestion.    . verapamil (CALAN-SR) 240 MG CR tablet Take 1.5 tablets (360 mg total) by mouth daily. 45 tablet 5   No current facility-administered medications on file prior to visit.    BP 145/70 mmHg  Pulse 77  Temp(Src) 98.3 F (36.8 C) (Oral)  Resp 20  Ht 5\' 6"  (1.676 m)  Wt 109 lb 9.6 oz (49.714 kg)  BMI 17.70 kg/m2  SpO2 99%        Objective:   Physical Exam  Constitutional: She is oriented to person, place, and time. She appears well-developed and well-nourished.  Eyes: EOM are normal. Pupils are equal, round, and reactive to light.  Cardiovascular: Normal rate, regular rhythm and normal heart sounds.   No murmur heard. Pulmonary/Chest: Effort normal and breath sounds normal. No respiratory distress. She has no wheezes.  Abdominal: Soft. Bowel sounds are normal. She exhibits no distension. There is no tenderness. There is no rebound.  Musculoskeletal:  Bilateral UE/LE strength is 5/5  Neurological: She is alert and oriented to person, place, and time.  Skin: Skin is warm and dry.  Psychiatric: She has a normal mood and affect. Her behavior is normal. Judgment and thought content normal.          Assessment & Plan:  Headache- Declines lab draw today.  Did have bmet and CBC drawn with Dr. Marin Olp (K+ a bit low- pt reports not taking kdur regularly) advised pt to resume [place]. I did advise her to return for lab draw at her convenience- as I want to check a sed rate to rule out temporal arteritis. Will obtain MR/MRA of the brain to further evaluate and rule out AVM.   Allergic rhinitis- trial of claritin.   Abdominal pain with walking- non-tender on exam- likely related to know disc disease- monitor.

## 2015-08-09 NOTE — Addendum Note (Signed)
Addended by: Debbrah Alar on: 08/09/2015 05:11 PM   Modules accepted: Orders

## 2015-08-09 NOTE — Patient Instructions (Addendum)
Continue on Albuterol Neb Four times a day  .  Continue on Pulmicort Neb Twice daily  .  Continue on Oxygen 3l/m with activity and 2l/m at rest .  Follow up 6 weeks and As needed   Please contact office for sooner follow up if symptoms do not improve or worsen or seek emergency care  Prevnar vaccine today .

## 2015-08-09 NOTE — Progress Notes (Signed)
Pre visit review using our clinic review tool, if applicable. No additional management support is needed unless otherwise documented below in the visit note. 

## 2015-08-09 NOTE — Patient Instructions (Signed)
Please complete lab work prior to leaving. We will contact you about setting up your MRI to further evaluate your headaches. Add claritin once daily for nasal drainage. Go to ER if you develop severe/worsening headache.

## 2015-08-09 NOTE — Addendum Note (Signed)
Addended by: Mathis Dad on: 08/09/2015 12:39 PM   Modules accepted: Orders

## 2015-08-09 NOTE — Assessment & Plan Note (Signed)
Continue on Oxygen 3l/m with activity and 2l/m at rest .  Follow up 6 weeks and As needed   Please contact office for sooner follow up if symptoms do not improve or worsen or seek emergency care  Prevnar vaccine today .

## 2015-08-09 NOTE — Progress Notes (Signed)
Subjective:    Patient ID: Holly Page, female    DOB: November 01, 1935, 79 y.o.   MRN: [id number]  HPI  79 y.o. F with Gold D Copd primary emphysema on O2 >GOLD card patient  Hx of DVT -05/2014 (tx w/ xarelto until 12/2014 ) -followed by hematology  Left arm superficial venous thrombus , Xarelto 01/25/15 >followed by Hematology    03/01/2015 Follow up : COPD and DVT  Pt returns for 6 week follow up for COPD . Says she is doing very well on her regimen with budesonide and perforomist Was started on Incruse last ov but was not able to tolerate. She was previously intolerant to spiriva as well.  Does complain that insurance is charging her too much for perforomist now.  She was going thru DME company but became too high. Now gets nebs through pharmacy  We called and determined it will be cheaper for Brovana. She is going to try this  May have to cut brovana back to once daily as it will still cost her $ 80 month for brovana and 50-60$ for budesonide (>$100 for perforomist) We also check with [facility] pharmacy as well.  We looked at Medicare B and D filing but unable to get cheaper.  She is going to speak with her pharmacy to see if Medicare B filing would be cheaper.  She denies chest pain, orthopnea, edema , fever or increased albuterol use Last CXR 11/2014 with chronic change  PVX is utd.   She had a thrombo-embolic event in the right inner inferior thigh. This happened in September 2015. She apparently had a DVT back in the 80s. She thinks she was on blood thinner for one month. She was treated with course of Xarelto 05/2014 to 12/2014 with repeat doppler showing no DVT , chronic thickening of the vein wall . She was taken off Xarelto 12/29/14.   01/25/15 she developed left arm pain and swelling, found to have a superficial venous thrombus in the left arm. She was restarted on Xarelto with starter pack and maintenance rx. .  She says she is doing okay on Xarelto and arm pain and swelling have  resolved.   >changed to [facility]    03/15/15 Acute OV : COPD flare  Pt presents for an acute office visit Complains of 1 week of increased dyspnea, DOE , wheezing and tightness.  Got upset at pharmacy b/c they would not fill Brovana.  We called pharmacy and cost is $80 for 30 vials and $160 for 60 vials We discussed using Brovana daily instead of Twice daily  To save money  Advised if breathing declines to alert office. Finances are major issue for pt.  She says she can try to afford $80 . We switched from inhalers to nebs due to high  Of cost with inhalers in past  She has not tolerated tudorza or spiriva in past.  No fever, cough, congestion or edema or hemoptysis .  Last cxr 11/2014 with chronic changes.  Gets anxious easily . Things upset her and then she get more winded.  We discussed relaxation techniques.  On chronic prednisone 10mg  daily  >>increased pred 20mg  daily for 1 wk.   03/26/15  Follow up : COPD  Patient returns for a follow-up for COPD. She says that she feels that her breathing is not getting any better. She has been having difficulty with insurance issues with medication cost. She has been using her Brovana once daily instead of  twice daily to help with cost savings. She is on Pulmicort nebulizer twice daily. She remains on prednisone 10 mg daily. Patient was seen with worsening shortness of breath and chest pain last month. She had a CT chest, on June 20 that showed no evidence of pulmonary embolism  and stable. Severe COPD changes. We reviewed her CT results. Last spirometry showed FEV1 at 30% in 2015.  She has been tried on [place] but was unable to tolerate. She denies any chest pain, palpitations, orthopnea, edema, fever or discolored mucus. Oxygen does drop with pulsing O2 on 2 L was able to keep above 90% on pulsing 3 L. Patient says she has been evaluated by cardiology with a negative stress test recently.  >>increased O2 3l/ act /2  rest   04/19/15  Follow up : ER visit /COPD /Resp Failure on O2  Pt returns for 2 week follow up .  Was seen in ER on 7/14 for dyspnea  CXR w/ no acute process.  She was recommended by our office to stop Brovana and begin albuterol neb Four times a day  Along  With her budesonide Twice daily  .  She does feel this is working better.  No as sob .  Has also been seeing cardiology with planned stress test next week.  Denies fever, orthopnea, edema or hemoptysis  >>refer to cardiology , continue on budesonide and albuterol   05/17/2015 Follow up : COPD and Resp failure on O2  Pt returns for 1 month follow up for COPD .  Last ov with ongoing dyspnea and chest tightness, refer to cards Seen by [facility] cardiology -Dr. Atilano Median  , patient says does not feel her symptoms are heart related.   Had venous doppler recently of right leg for pain which was neg for DVt.   Remains on Budesonide neb Twice daily  And Albuterol Neb Four times a day  .  Remains on prednisone 10mg  daily .  Remains on O2 2l/m rest and 3l/m with act .  PVX utd. Does not want prevnar today , will discuss later.  Refuses flu shot.   Denies chest pain, orthopnea, edema or fever.   07/05/2015 Follow up: COPD /O2 dependent  Pt returns for follow up for COPD .  Remains on Budesonide neb Twice daily  And Albuterol Neb Four times a day  .  Remains on prednisone 5mg  . She wants to try to wean completely off.   Remains on O2 2l/m rest and 3l/m with act .  PVX utd. Does not want prevnar today , would like to get at next ov.   Refuses flu shot.  She is having trouble with recurrent hematuria . She has an appointment with urology next week.  She is frustrated with all her medical problems and feels over whelmed.  She denies chest pain, orthopnea, edema or fever.  >>wean off pred    08/09/2015 Follow up : COPD /O2 dependent  Patient returns for a one-month follow-up.  patient has severe COPD, is on budesonide nebulizer twice daily  and albuterol nebulizer 4 times daily.  She has been on chronic steroids on and off.  Last visit. She was instructed to taper off of slowly.  He has been off of prednisone for about 10 days.  She says her breathing is no different  On or off a prednisone.  She has had no flare of her cough or wheezing.  She was recently seen by hematology and Xarelto  was discontinued.  She had a superficial clot earlier this year and completed her course.  It was felt that she did not need lifelong anticoagulation.   Patient denies any hemoptysis, orthopnea, PND or chest pain.   We discussed Prevnar vaccine today and she is agreeable to get this.  Review of Systems  Constitutional:   No  weight loss, night sweats,  Fevers, chills, + fatigue, or  lassitude.  HEENT:   No headaches,  Difficulty swallowing,  Tooth/dental problems, or  Sore throat,                No sneezing, itching, ear ache, nasal congestion, post nasal drip,   CV:  No chest pain,  Orthopnea, PND, swelling in lower extremities, anasarca, dizziness, palpitations, syncope.   GI  Notes heartburn, no indigestion, abdominal pain, nausea, vomiting, diarrhea, change in bowel habits, loss of appetite, bloody stools.   Resp:  .    No chest wall deformity  Skin: no rash or lesions.  GU: no dysuria, change in color of urine, no urgency or frequency.  No flank pain, no hematuria   MS:  No joint pain or swelling.  No decreased range of motion.  No back pain.  Psych:  No change in mood or affect.+anxiety.  No memory loss.      Objective:   Physical Exam  GEN: A/Ox3; pleasant , NAD, elderly and frail   HEENT:  [place]/AT,  EACs-clear, TMs-wnl, NOSE-clear, THROAT-clear, no lesions  NECK:  Supple w/ fair ROM; no JVD; normal carotid impulses w/o bruits; no thyromegaly or nodules palpated; no lymphadenopathy.  RESP  Diminished in bases w/ no wheezing or rhonchi.no accessory muscle use, no dullness to percussion  CARD:  RRR, no m/r/g  , no  peripheral edema, pulses intact, no cyanosis or clubbing.  GI:   Soft & nt; nml bowel sounds; no organomegaly or masses detected.  Musco: Warm bil, no deformities or joint swelling noted.   Neuro: alert, no focal deficits noted.    Skin: Warm, no lesions or rashes .       Assessment & Plan:

## 2015-08-09 NOTE — Assessment & Plan Note (Signed)
Compensated , no flare of chronic steroids   Plan  Continue on Albuterol Neb Four times a day  .  Continue on Pulmicort Neb Twice daily  .  Continue on Oxygen 3l/m with activity and 2l/m at rest .  Follow up 6 weeks and As needed   Please contact office for sooner follow up if symptoms do not improve or worsen or seek emergency care  Prevnar vaccine today .

## 2015-08-10 NOTE — Addendum Note (Signed)
Addended by: Debbrah Alar on: 08/10/2015 10:40 AM   Modules accepted: Orders

## 2015-08-13 ENCOUNTER — Ambulatory Visit: Payer: Medicare Other | Admitting: Family

## 2015-08-13 ENCOUNTER — Telehealth: Payer: Self-pay | Admitting: *Deleted

## 2015-08-13 NOTE — Telephone Encounter (Signed)
Patient co-pay on new iron fusion prescription $34. Patient states she can't afford this. She wants to know what she can take as an alternative. Spoke to Dr Marin Olp and he wants patient to just take OTC iron if she is unable to obtain the prescription. She is in understanding.

## 2015-08-13 NOTE — Progress Notes (Signed)
Reviewed & agree with plan  

## 2015-08-16 ENCOUNTER — Ambulatory Visit: Payer: Medicare Other | Admitting: Adult Health

## 2015-08-30 ENCOUNTER — Encounter (HOSPITAL_BASED_OUTPATIENT_CLINIC_OR_DEPARTMENT_OTHER): Payer: Self-pay | Admitting: *Deleted

## 2015-08-30 ENCOUNTER — Inpatient Hospital Stay (HOSPITAL_BASED_OUTPATIENT_CLINIC_OR_DEPARTMENT_OTHER)
Admission: EM | Admit: 2015-08-30 | Discharge: 2015-09-02 | DRG: 190 | Disposition: A | Discharge status: Home / Self Care | Payer: Medicare Other | Attending: Internal Medicine | Admitting: Internal Medicine

## 2015-08-30 ENCOUNTER — Ambulatory Visit
Admission: RE | Admit: 2015-08-30 | Discharge: 2015-08-30 | Disposition: A | Discharge status: Home / Self Care | Payer: Medicare Other | Source: Ambulatory Visit | Attending: Family | Admitting: Family

## 2015-08-30 ENCOUNTER — Emergency Department (HOSPITAL_BASED_OUTPATIENT_CLINIC_OR_DEPARTMENT_OTHER): Payer: Medicare Other

## 2015-08-30 DIAGNOSIS — R0902 Hypoxemia: Secondary | ICD-10-CM | POA: Insufficient documentation

## 2015-08-30 DIAGNOSIS — Z87891 Personal history of nicotine dependence: Secondary | ICD-10-CM | POA: Diagnosis not present

## 2015-08-30 DIAGNOSIS — R52 Pain, unspecified: Secondary | ICD-10-CM

## 2015-08-30 DIAGNOSIS — Z79899 Other long term (current) drug therapy: Secondary | ICD-10-CM | POA: Diagnosis not present

## 2015-08-30 DIAGNOSIS — J9621 Acute and chronic respiratory failure with hypoxia: Secondary | ICD-10-CM | POA: Diagnosis present

## 2015-08-30 DIAGNOSIS — Z8672 Personal history of thrombophlebitis: Secondary | ICD-10-CM | POA: Diagnosis not present

## 2015-08-30 DIAGNOSIS — Z681 Body mass index (BMI) 19 or less, adult: Secondary | ICD-10-CM

## 2015-08-30 DIAGNOSIS — I10 Essential (primary) hypertension: Secondary | ICD-10-CM | POA: Diagnosis present

## 2015-08-30 DIAGNOSIS — K219 Gastro-esophageal reflux disease without esophagitis: Secondary | ICD-10-CM | POA: Diagnosis present

## 2015-08-30 DIAGNOSIS — M4800 Spinal stenosis, site unspecified: Secondary | ICD-10-CM | POA: Diagnosis present

## 2015-08-30 DIAGNOSIS — R51 Headache: Principal | ICD-10-CM

## 2015-08-30 DIAGNOSIS — R519 Headache, unspecified: Secondary | ICD-10-CM

## 2015-08-30 DIAGNOSIS — R0600 Dyspnea, unspecified: Secondary | ICD-10-CM

## 2015-08-30 DIAGNOSIS — J441 Chronic obstructive pulmonary disease with (acute) exacerbation: Principal | ICD-10-CM

## 2015-08-30 DIAGNOSIS — Z86718 Personal history of other venous thrombosis and embolism: Secondary | ICD-10-CM | POA: Diagnosis not present

## 2015-08-30 DIAGNOSIS — E739 Lactose intolerance, unspecified: Secondary | ICD-10-CM | POA: Diagnosis present

## 2015-08-30 DIAGNOSIS — E440 Moderate protein-calorie malnutrition: Secondary | ICD-10-CM | POA: Diagnosis present

## 2015-08-30 DIAGNOSIS — D320 Benign neoplasm of cerebral meninges: Secondary | ICD-10-CM | POA: Diagnosis present

## 2015-08-30 LAB — URINALYSIS, ROUTINE W REFLEX MICROSCOPIC
Bilirubin Urine: NEGATIVE
Glucose, UA: NEGATIVE mg/dL
Ketones, ur: 15 mg/dL — AB
Leukocytes, UA: NEGATIVE
NITRITE: NEGATIVE
Protein, ur: NEGATIVE mg/dL
SPECIFIC GRAVITY, URINE: 1.025 (ref 1.005–1.030)
pH: 7.5 (ref 5.0–8.0)

## 2015-08-30 LAB — BASIC METABOLIC PANEL
ANION GAP: 6 (ref 5–15)
BUN: 7 mg/dL (ref 6–20)
CALCIUM: 9.7 mg/dL (ref 8.9–10.3)
CO2: 39 mmol/L — ABNORMAL HIGH (ref 22–32)
Chloride: 96 mmol/L — ABNORMAL LOW (ref 101–111)
Creatinine, Ser: 0.47 mg/dL (ref 0.44–1.00)
GLUCOSE: 118 mg/dL — AB (ref 65–99)
POTASSIUM: 3.3 mmol/L — AB (ref 3.5–5.1)
Sodium: 141 mmol/L (ref 135–145)

## 2015-08-30 LAB — CBC WITH DIFFERENTIAL/PLATELET
BASOS ABS: 0 10*3/uL (ref 0.0–0.1)
BASOS PCT: 0 %
EOS PCT: 1 %
Eosinophils Absolute: 0.1 10*3/uL (ref 0.0–0.7)
HCT: 38.4 % (ref 36.0–46.0)
Hemoglobin: 11.4 g/dL — ABNORMAL LOW (ref 12.0–15.0)
LYMPHS PCT: 23 %
Lymphs Abs: 1.4 10*3/uL (ref 0.7–4.0)
MCH: 22.6 pg — ABNORMAL LOW (ref 26.0–34.0)
MCHC: 29.7 g/dL — ABNORMAL LOW (ref 30.0–36.0)
MCV: 76 fL — AB (ref 78.0–100.0)
MONO ABS: 0.5 10*3/uL (ref 0.1–1.0)
Monocytes Relative: 8 %
NEUTROS ABS: 4 10*3/uL (ref 1.7–7.7)
Neutrophils Relative %: 68 %
PLATELETS: 281 10*3/uL (ref 150–400)
RBC: 5.05 MIL/uL (ref 3.87–5.11)
RDW: 15.4 % (ref 11.5–15.5)
WBC: 5.9 10*3/uL (ref 4.0–10.5)

## 2015-08-30 LAB — URINE MICROSCOPIC-ADD ON

## 2015-08-30 LAB — BRAIN NATRIURETIC PEPTIDE: B NATRIURETIC PEPTIDE 5: 44.8 pg/mL (ref 0.0–100.0)

## 2015-08-30 LAB — TROPONIN I

## 2015-08-30 MED ORDER — METHYLPREDNISOLONE SODIUM SUCC 125 MG IJ SOLR
125.0000 mg | Freq: Once | INTRAMUSCULAR | Status: AC
  Administered 2015-08-30: 125 mg via INTRAVENOUS
  Filled 2015-08-30: qty 2

## 2015-08-30 MED ORDER — ONDANSETRON HCL 4 MG PO TABS: 4.0000 mg | ORAL_TABLET | Freq: Four times a day (QID) | ORAL | Status: DC | PRN

## 2015-08-30 MED ORDER — SALINE SPRAY 0.65 % NA SOLN
2.0000 | NASAL | Status: DC | PRN
  Filled 2015-08-30: qty 44

## 2015-08-30 MED ORDER — GADOBENATE DIMEGLUMINE 529 MG/ML IV SOLN
10.0000 mL | Freq: Once | INTRAVENOUS | Status: AC | PRN
Start: 2015-08-30 — End: 2015-08-30
  Administered 2015-08-30: 10 mL via INTRAVENOUS

## 2015-08-30 MED ORDER — DEXTROSE 5 % IV SOLN
1.0000 g | Freq: Every day | INTRAVENOUS | Status: DC
  Administered 2015-08-31 – 2015-09-01 (×3): 1 g via INTRAVENOUS
  Filled 2015-08-30 (×4): qty 10

## 2015-08-30 MED ORDER — METHYLPREDNISOLONE SODIUM SUCC 125 MG IJ SOLR
60.0000 mg | Freq: Four times a day (QID) | INTRAMUSCULAR | Status: DC
  Administered 2015-08-31 – 2015-09-02 (×11): 60 mg via INTRAVENOUS
  Filled 2015-08-30 (×11): qty 2

## 2015-08-30 MED ORDER — ACETAMINOPHEN 325 MG PO TABS
650.0000 mg | ORAL_TABLET | Freq: Four times a day (QID) | ORAL | Status: DC | PRN
Start: 2015-08-30 — End: 2015-09-02

## 2015-08-30 MED ORDER — IOHEXOL 350 MG/ML SOLN
80.0000 mL | Freq: Once | INTRAVENOUS | Status: AC | PRN
  Administered 2015-08-30: 80 mL via INTRAVENOUS

## 2015-08-30 MED ORDER — SODIUM CHLORIDE 0.9 % IJ SOLN
3.0000 mL | Freq: Two times a day (BID) | INTRAMUSCULAR | Status: DC
  Administered 2015-08-31 – 2015-09-02 (×4): 3 mL via INTRAVENOUS

## 2015-08-30 MED ORDER — SODIUM CHLORIDE 0.9 % IV SOLN
INTRAVENOUS | Status: DC
  Administered 2015-08-31 – 2015-09-01 (×3): via INTRAVENOUS

## 2015-08-30 MED ORDER — ALBUTEROL SULFATE (2.5 MG/3ML) 0.083% IN NEBU
5.0000 mg | INHALATION_SOLUTION | Freq: Once | RESPIRATORY_TRACT | Status: AC
  Administered 2015-08-30: 5 mg via RESPIRATORY_TRACT
  Filled 2015-08-30: qty 6

## 2015-08-30 MED ORDER — ALBUTEROL SULFATE (2.5 MG/3ML) 0.083% IN NEBU
2.5000 mg | INHALATION_SOLUTION | Freq: Four times a day (QID) | RESPIRATORY_TRACT | Status: DC
  Administered 2015-08-31 (×3): 2.5 mg via RESPIRATORY_TRACT
  Filled 2015-08-30 (×4): qty 3

## 2015-08-30 MED ORDER — LORATADINE 10 MG PO TABS
10.0000 mg | ORAL_TABLET | Freq: Every day | ORAL | Status: DC
  Administered 2015-08-31 – 2015-09-02 (×3): 10 mg via ORAL
  Filled 2015-08-30 (×3): qty 1

## 2015-08-30 MED ORDER — ACETAMINOPHEN-CODEINE #3 300-30 MG PO TABS
1.0000 | ORAL_TABLET | Freq: Three times a day (TID) | ORAL | Status: DC | PRN
  Administered 2015-09-01: 2 via ORAL
  Filled 2015-08-30 (×3): qty 2

## 2015-08-30 MED ORDER — VERAPAMIL HCL ER 180 MG PO TBCR
360.0000 mg | EXTENDED_RELEASE_TABLET | Freq: Every day | ORAL | Status: DC
  Administered 2015-08-31 – 2015-09-02 (×3): 360 mg via ORAL
  Filled 2015-08-30 (×5): qty 2

## 2015-08-30 MED ORDER — FAMOTIDINE 20 MG PO TABS
20.0000 mg | ORAL_TABLET | Freq: Every day | ORAL | Status: DC
  Administered 2015-08-31 – 2015-09-01 (×3): 20 mg via ORAL
  Filled 2015-08-30 (×3): qty 1

## 2015-08-30 MED ORDER — FUSION 65-65-25-30 MG PO CAPS: 1.0000 | ORAL_CAPSULE | Freq: Every day | ORAL | Status: DC

## 2015-08-30 MED ORDER — POTASSIUM CHLORIDE CRYS ER 20 MEQ PO TBCR
20.0000 meq | EXTENDED_RELEASE_TABLET | Freq: Every day | ORAL | Status: DC
  Administered 2015-08-31 – 2015-09-02 (×3): 20 meq via ORAL
  Filled 2015-08-30 (×3): qty 1

## 2015-08-30 MED ORDER — ONDANSETRON HCL 4 MG/2ML IJ SOLN: 4.0000 mg | Freq: Four times a day (QID) | INTRAMUSCULAR | Status: DC | PRN

## 2015-08-30 MED ORDER — ACETAMINOPHEN 650 MG RE SUPP: 650.0000 mg | Freq: Four times a day (QID) | RECTAL | Status: DC | PRN

## 2015-08-30 MED ORDER — BUDESONIDE 0.25 MG/2ML IN SUSP
0.2500 mg | Freq: Two times a day (BID) | RESPIRATORY_TRACT | Status: DC
  Administered 2015-08-31 – 2015-09-02 (×5): 0.25 mg via RESPIRATORY_TRACT
  Filled 2015-08-30 (×7): qty 2

## 2015-08-30 MED ORDER — HEPARIN SODIUM (PORCINE) 5000 UNIT/ML IJ SOLN
5000.0000 [IU] | Freq: Three times a day (TID) | INTRAMUSCULAR | Status: DC
  Administered 2015-08-31 – 2015-09-02 (×9): 5000 [IU] via SUBCUTANEOUS
  Filled 2015-08-30 (×9): qty 1

## 2015-08-30 NOTE — ED Notes (Signed)
Chest pain, dizziness and SOB when she tries to stand. Started at 2pm while getting an MRI of her head. She has oxygen at home for COPD. Weakness x 2 weeks after getting the flu shot.

## 2015-08-30 NOTE — ED Provider Notes (Signed)
CSN: [id number]     Arrival date & time 08/30/15  1640 History   First MD Initiated Contact with Patient 08/30/15 1658     Chief Complaint  Patient presents with  . Shortness of Breath     (Consider location/radiation/quality/duration/timing/severity/associated sxs/prior Treatment) HPI Comments: 79 y.o. Female with history of COPD, DVT, HTN presents for shortness of breath and increased oxygen demand.  The patient reports that she usually can do her daily activities with mild shortness of breath but that her shortness of breath has been getting so bad she can hardly do anything other than sitting around all day.  She reports that she is okay when sitting but as soon as she gets up and moves around she becomes very short of breath and has noted her oxygen dropping to the low 80s which is unusual.  She denies fevers, increased cough, sputum production.  She reports pressure in her left chest.  She states that her Xarelto was stopped recently because she no longer needed it for the clot in her leg.  She also reports that she was weaned off of her chronic steroids for her breathing recently.     Past Medical History  Diagnosis Date  . Pneumonia   . COPD (chronic obstructive pulmonary disease) ([place])   . Hypertension   . DVT (deep venous thrombosis) ([place]) 1980s, recurrent 2015    Right DVT 2015  on xarelto  . GERD (gastroesophageal reflux disease)   . Lactose intolerance   . Spinal stenosis   . Meningioma ([place])     followed by Dr Christella Noa   Past Surgical History  Procedure Laterality Date  . Foot surgery    . Cholecystectomy    . Tonsillectomy    . Tubal ligation     Family History  Problem Relation Age of Onset  . Lupus Sister   . COPD Father     smoker deceased.   [name] Heart attack Son    Social History  Substance Use Topics  . Smoking status: Former Smoker -- 1.50 packs/day for 56 years    Types: Cigarettes    Quit date: 04/29/2012  . Smokeless tobacco: Never Used     Comment:  quit smoking 3 years ago  . Alcohol Use: No     Comment: quit drinking beer 2005   OB History    No data available     Review of Systems  Constitutional: Positive for fatigue. Negative for fever and chills.  HENT: Negative for congestion, postnasal drip, rhinorrhea and sinus pressure.   Eyes: Negative for pain and redness.  Respiratory: Positive for cough, chest tightness and shortness of breath.   Cardiovascular: Positive for chest pain. Negative for palpitations and leg swelling.  Gastrointestinal: Negative for nausea, vomiting, abdominal pain, diarrhea and constipation.  Genitourinary: Negative for dysuria, urgency, frequency and hematuria.  Musculoskeletal: Negative for myalgias and back pain.  Skin: Negative for rash.  Neurological: Positive for weakness (generalized, associated with episodes of shortness of breath). Negative for dizziness, seizures, light-headedness, numbness and headaches.  Hematological: Does not bruise/bleed easily.      Allergies  Incruse ellipta; Tramadol; Amlodipine; Aspirin; Codeine; Doxycycline; Hydrocodone; Losartan; Propoxyphene; and Tiotropium  Home Medications   Prior to Admission medications   Medication Sig Start Date End Date Taking? Authorizing Provider  acetaminophen-codeine (TYLENOL #3) 300-30 MG tablet Take 1-2 tablets by mouth every 8 (eight) hours as needed for moderate pain or severe pain. 07/16/15   Debbrah Alar, NP  albuterol (PROVENTIL  HFA;VENTOLIN HFA) 108 (90 BASE) MCG/ACT inhaler Inhale 2 puffs into the lungs every 6 (six) hours as needed for wheezing or shortness of breath. 01/25/15   Elsie Stain, MD  albuterol (PROVENTIL) (2.5 MG/3ML) 0.083% nebulizer solution Take 3 mLs (2.5 mg total) by nebulization every 6 (six) hours as needed for shortness of breath. And as needed 05/18/15   Tammy S Parrett, NP  budesonide (PULMICORT) 0.25 MG/2ML nebulizer solution Take 2 mLs (0.25 mg total) by nebulization 2 (two) times daily. DX:  J43.9 02/12/15   Elsie Stain, MD  cholecalciferol (VITAMIN D) 1000 UNITS tablet Take 1,000 Units by mouth daily.    Historical Provider, MD  Fe Fum-Fe Poly-Vit C-Lactobac (FUSION) 65-65-25-30 MG CAPS Take 1 tablet by mouth daily. 08/07/15   Volanda Napoleon, MD  loratadine (CLARITIN) 10 MG tablet Take 1 tablet (10 mg total) by mouth daily. 08/09/15   Debbrah Alar, NP  Multiple Vitamins-Minerals (MULTIVITAMIN & MINERAL PO) Take 1 tablet by mouth daily.    Historical Provider, MD  potassium chloride SA (K-DUR,KLOR-CON) 20 MEQ tablet Take 1 tablet (20 mEq total) by mouth daily. 06/20/15   Debbrah Alar, NP  ranitidine (ZANTAC) 300 MG tablet Take 1 tablet (300 mg total) by mouth at bedtime. 1 tablet at bedtime 04/24/15 04/23/16  Debbrah Alar, NP  sodium chloride (OCEAN) 0.65 % SOLN nasal spray Place 2 sprays into both nostrils as needed for congestion.    Historical Provider, MD  verapamil (CALAN-SR) 240 MG CR tablet Take 1.5 tablets (360 mg total) by mouth daily. 03/12/15   Colon Branch, MD   BP 110/59 mmHg  Pulse 80  Temp(Src) 98.1 F (36.7 C) (Oral)  Resp 18  SpO2 100% Physical Exam  Constitutional: She is oriented to person, place, and time. She appears well-developed. She appears distressed (respiratory).  HENT:  Head: Normocephalic and atraumatic.  Right Ear: External ear normal.  Left Ear: External ear normal.  Nose: Nose normal.  Mouth/Throat: Oropharynx is clear and moist. No oropharyngeal exudate.  Eyes: EOM are normal. Pupils are equal, round, and reactive to light.  Neck: Normal range of motion. Neck supple.  Cardiovascular: Normal rate, regular rhythm, normal heart sounds and intact distal pulses.   No murmur heard. Pulmonary/Chest: She is in respiratory distress (after minimal exertion). She has decreased breath sounds (diffuse). She has wheezes (few, scattered). She has no rales.  Abdominal: Soft. She exhibits no distension. There is no tenderness.   Musculoskeletal: Normal range of motion. She exhibits no edema or tenderness.  Neurological: She is alert and oriented to person, place, and time.  Skin: Skin is warm and dry. No rash noted. She is not diaphoretic.  Vitals reviewed.   ED Course  Procedures (including critical care time) Labs Review Labs Reviewed  CBC WITH DIFFERENTIAL/PLATELET - Abnormal; Notable for the following:    Hemoglobin 11.4 (*)    MCV 76.0 (*)    MCH 22.6 (*)    MCHC 29.7 (*)    All other components within normal limits  BASIC METABOLIC PANEL - Abnormal; Notable for the following:    Potassium 3.3 (*)    Chloride 96 (*)    CO2 39 (*)    Glucose, Bld 118 (*)    All other components within normal limits  BRAIN NATRIURETIC PEPTIDE  TROPONIN I    Imaging Review Dg Chest 2 View  08/30/2015  CLINICAL DATA:  Chest pain, dizziness, weakness EXAM: CHEST  2 VIEW COMPARISON:  04/12/2015 FINDINGS:  Cardiomediastinal silhouette is stable. Hyperinflation again noted. No acute infiltrate or pleural effusion. No pulmonary edema. Bony thorax is stable. IMPRESSION: No active cardiopulmonary disease.  Hyperinflation again noted. Electronically Signed   By: Lahoma Crocker M.D.   On: 08/30/2015 18:04   Mr Jeri Cos [id number] Contrast  08/30/2015  CLINICAL DATA:  Severe frontal and parietal headaches for 1 year. EXAM: MRI HEAD WITHOUT AND WITH CONTRAST TECHNIQUE: Multiplanar, multiecho pulse sequences of the brain and surrounding structures were obtained without and with intravenous contrast. CONTRAST:  22mL MULTIHANCE GADOBENATE DIMEGLUMINE 529 MG/ML IV SOLN COMPARISON:  Head CT 05/28/2015 and MRI 08/17/2014 FINDINGS: Nonexpanded partially empty sella is unchanged. There is no evidence of acute infarct, intracranial hemorrhage, intra-axial mass, midline shift, or extra-axial fluid collection. Ventricles and sulci are within normal limits for age. Patchy and confluent T2 hyperintensities throughout the subcortical and deep cerebral white  matter bilaterally are unchanged and compatible with extensive chronic small vessel ischemic disease. Chronic ischemic changes are also noted in the thalami and pons. Enhancing extra-axial mass projecting superiorly from the greater wing of the sphenoid on the left is unchanged, measuring 9 x 7 x 7 mm. There is no significant mass effect or edema in the adjacent left frontal lobe. No new enhancing lesions are identified. Orbits are unremarkable. Paranasal sinuses and mastoid air cells are clear. Major intracranial vascular flow voids are preserved. IMPRESSION: 1. No acute intracranial abnormality. 2. Extensive chronic small vessel ischemic disease. 3. Unchanged 9 mm meningioma along the left greater sphenoid wing. Electronically Signed   By: Logan Bores M.D.   On: 08/30/2015 16:08   I have personally reviewed and evaluated these images and lab results as part of my medical decision-making.   EKG Interpretation   Date/Time:  Thursday August 30 2015 16:53:16 EST Ventricular Rate:  81 PR Interval:  182 QRS Duration: 76 QT Interval:  370 QTC Calculation: 429 R Axis:   86 Text Interpretation:  Sinus rhythm with marked sinus arrhythmia with  occasional Premature ventricular complexes Abnormal ECG Wander on EKG   within limitatrions no significant change since previous tracing Confirmed  by Euclide Granito ([id number]) on 08/30/2015 4:59:15 PM      MDM  Patient was seen and evaluated in stable condition.  EKG, BNP, trop unremarkable.  Chest xray with hyperinflation but no acute finding.  Concern for possible PE as patient recently d/c'd on anticoagulation but CTA consistent with COPD without PE.  Patient was given breathing treatments and solumedrol but continued to have significant respiratory distress on minimal exertion and increased oxygen demand.  Case was discussed with Dr. Alcario Drought who agreed with admission and patient was admitted under his care to telemetry for continued treatment for COPD  exacerbation vs further work up for other cause of dyspnea. Final diagnoses:  None    1. COPD exacerbation  2. Dyspnea  3. Increased oxygen demand    Harvel Quale, MD 08/31/15 [phone]

## 2015-08-30 NOTE — H&P (Addendum)
[facility] History and Physical  Holly Page [id number] DOB: 1935-11-15 DOA: 08/30/2015  Referring physician: Holland Falling, MD PCP: Nance Pear., NP   Chief Complaint: Shortness of Breath  HPI: Holly Page is a 79 y.o. female with prior history of COPD HTN DVT GERD presents with increased shortness of breath. She has been having SOB for the last couple weeks. She states that 2 weeks ago she took a pneumonia shot and she has been feeling weak since. She had an MRI today and today she felt that she could not even undress. She had no cough but has felt congested. She states that she has had wheeze. She has no fever noted. She has no chest pain. She states that she is tight. She states that she has been on oxygen for the last year. She states that she does not smoke currently. Patient quit 3 years ago. Patient states that she has headache. She states that she was getting an MRI for a brain tumor.    Review of Systems:  Complete ROS performed and is unremarkable other than HPI.   Past Medical History  Diagnosis Date  . Pneumonia   . COPD (chronic obstructive pulmonary disease) ([place])   . Hypertension   . DVT (deep venous thrombosis) ([place]) 1980s, recurrent 2015    Right DVT 2015  on xarelto  . GERD (gastroesophageal reflux disease)   . Lactose intolerance   . Spinal stenosis   . Meningioma ([place])     followed by Dr Christella Noa   Past Surgical History  Procedure Laterality Date  . Foot surgery    . Cholecystectomy    . Tonsillectomy    . Tubal ligation     Social History:  reports that she quit smoking about 3 years ago. Her smoking use included Cigarettes. She has a 84 pack-year smoking history. She has never used smokeless tobacco. She reports that she does not drink alcohol or use illicit drugs.  Allergies  Allergen Reactions  . Incruse Ellipta [Umeclidinium Bromide] Shortness Of Breath  . Tramadol Shortness Of Breath  . Amlodipine     Other  reaction(s): SWELLING  . Aspirin     Other reaction(s): PALPITATIONS Other reaction(s): DIFFICULTY BREATHING Heart flutter  . Codeine Other (See Comments)    Hallucinations, can take Tylenol #3 now  . Doxycycline     Other reaction(s): NAUSEA,VOMITING  . Hydrocodone     hallucinations  . Losartan     unknown  . Propoxyphene Nausea Only    Other reaction(s): NAUSEA  . Tiotropium Itching and Rash    Family History  Problem Relation Age of Onset  . Lupus Sister   . COPD Father     smoker deceased.   [name] Heart attack Son      Prior to Admission medications   Medication Sig Start Date End Date Taking? Authorizing Provider  acetaminophen-codeine (TYLENOL #3) 300-30 MG tablet Take 1-2 tablets by mouth every 8 (eight) hours as needed for moderate pain or severe pain. 07/16/15   Debbrah Alar, NP  albuterol (PROVENTIL HFA;VENTOLIN HFA) 108 (90 BASE) MCG/ACT inhaler Inhale 2 puffs into the lungs every 6 (six) hours as needed for wheezing or shortness of breath. 01/25/15   Elsie Stain, MD  albuterol (PROVENTIL) (2.5 MG/3ML) 0.083% nebulizer solution Take 3 mLs (2.5 mg total) by nebulization every 6 (six) hours as needed for shortness of breath. And as needed 05/18/15   Tammy S Parrett, NP  budesonide (PULMICORT) 0.25 MG/2ML nebulizer  solution Take 2 mLs (0.25 mg total) by nebulization 2 (two) times daily. DX: J43.9 02/12/15   Elsie Stain, MD  cholecalciferol (VITAMIN D) 1000 UNITS tablet Take 1,000 Units by mouth daily.    Historical Provider, MD  Fe Fum-Fe Poly-Vit C-Lactobac (FUSION) 65-65-25-30 MG CAPS Take 1 tablet by mouth daily. 08/07/15   Volanda Napoleon, MD  loratadine (CLARITIN) 10 MG tablet Take 1 tablet (10 mg total) by mouth daily. 08/09/15   Debbrah Alar, NP  Multiple Vitamins-Minerals (MULTIVITAMIN & MINERAL PO) Take 1 tablet by mouth daily.    Historical Provider, MD  potassium chloride SA (K-DUR,KLOR-CON) 20 MEQ tablet Take 1 tablet (20 mEq total) by mouth  daily. 06/20/15   Debbrah Alar, NP  ranitidine (ZANTAC) 300 MG tablet Take 1 tablet (300 mg total) by mouth at bedtime. 1 tablet at bedtime 04/24/15 04/23/16  Debbrah Alar, NP  sodium chloride (OCEAN) 0.65 % SOLN nasal spray Place 2 sprays into both nostrils as needed for congestion.    Historical Provider, MD  verapamil (CALAN-SR) 240 MG CR tablet Take 1.5 tablets (360 mg total) by mouth daily. 03/12/15   Colon Branch, MD   Physical Exam: Filed Vitals:   08/30/15 2126 08/30/15 2131 08/30/15 2200 08/30/15 2254  BP:  119/60 118/56 120/66  Pulse:  82 79 78  Temp:    98.2 F (36.8 C)  TempSrc:    Oral  Resp:    18  Height:    5\' 5"  (1.651 m)  Weight:    45.904 kg (101 lb 3.2 oz)  SpO2: 100% 100% 100% 98%    Wt Readings from Last 3 Encounters:  08/30/15 45.904 kg (101 lb 3.2 oz)  08/09/15 49.714 kg (109 lb 9.6 oz)  08/09/15 49.896 kg (110 lb)    General:  Appears calm and comfortable Eyes: PERRL, normal lids, irises & conjunctiva ENT: grossly normal hearing, lips & tongue Neck: no LAD, masses or thyromegaly Cardiovascular: RRR, no m/r/g. No LE edema Respiratory: diminished BS bilaterally Normal respiratory effort. Abdomen: soft, ntnd Skin: no rash or induration seen on limited exam Musculoskeletal: grossly normal tone BUE/BLE Psychiatric: grossly normal mood and affect Neurologic: grossly non-focal.          Labs on Admission:  Basic Metabolic Panel:  Recent Labs Lab 08/30/15 1700  NA 141  K 3.3*  CL 96*  CO2 39*  GLUCOSE 118*  BUN 7  CREATININE 0.47  CALCIUM 9.7   Liver Function Tests: No results for input(s): AST, ALT, ALKPHOS, BILITOT, PROT, ALBUMIN in the last 168 hours. No results for input(s): LIPASE, AMYLASE in the last 168 hours. No results for input(s): AMMONIA in the last 168 hours. CBC:  Recent Labs Lab 08/30/15 1700  WBC 5.9  NEUTROABS 4.0  HGB 11.4*  HCT 38.4  MCV 76.0*  PLT 281   Cardiac Enzymes:  Recent Labs Lab 08/30/15 1700    TROPONINI <0.03    BNP (last 3 results)  Recent Labs  04/12/15 0853 08/30/15 1700  BNP 24.5 44.8    ProBNP (last 3 results)  Recent Labs  09/14/14 1246  PROBNP 49.2    CBG: No results for input(s): GLUCAP in the last 168 hours.  Radiological Exams on Admission: Dg Chest 2 View  08/30/2015  CLINICAL DATA:  Chest pain, dizziness, weakness EXAM: CHEST  2 VIEW COMPARISON:  04/12/2015 FINDINGS: Cardiomediastinal silhouette is stable. Hyperinflation again noted. No acute infiltrate or pleural effusion. No pulmonary edema. Bony thorax is stable.  IMPRESSION: No active cardiopulmonary disease.  Hyperinflation again noted. Electronically Signed   By: Lahoma Crocker M.D.   On: 08/30/2015 18:04   Ct Angio Chest Pe W/cm &/or Wo Cm  08/30/2015  CLINICAL DATA:  Shortness of breath. Weakness. Chest tightness. COPD . EXAM: CT ANGIOGRAPHY CHEST WITH CONTRAST TECHNIQUE: Multidetector CT imaging of the chest was performed using the standard protocol during bolus administration of intravenous contrast. Multiplanar CT image reconstructions and MIPs were obtained to evaluate the vascular anatomy. CONTRAST:  65mL OMNIPAQUE IOHEXOL 350 MG/ML SOLN COMPARISON:  Chest radiograph from earlier today. 03/19/2015 chest CT angiogram. FINDINGS: Mediastinum/Nodes: The study is high quality for the evaluation of pulmonary embolism. There are no filling defects in the central, lobar, segmental or subsegmental pulmonary artery branches to suggest acute pulmonary embolism. Atherosclerotic nonaneurysmal thoracic aorta. Main pulmonary diameter of 2.9 cm is within normal limits. Normal heart size. No pericardial fluid/thickening. Normal visualized thyroid. Normal esophagus. No pathologically enlarged axillary, mediastinal or hilar lymph nodes. Lungs/Pleura: No pneumothorax. No pleural effusion. Severe centrilobular emphysema and diffuse bronchial wall thickening. Subpleural 2.2 x 1.1 cm irregular focus of consolidation in the  apical right upper lobe (series 6/ image 9) is stable since 05/28/2012, in keeping with benign pleural-parenchymal scarring. No acute consolidative airspace disease, new significant pulmonary nodules or lung masses. Upper abdomen: Stable granulomatous calcification in the right liver lobe. Stable 11 mm and 5 mm calcifications in the upper right kidney. Stable 5 mm calcification in the upper left kidney. Hyperdense 0.7 cm lesion in the lateral upper left kidney is too small to characterize and not appreciably changed since 09/13/2014, suggesting a benign lesion. Hyperdense 1.2 cm lesion in the lateral upper right kidney is indeterminate and stable in size since 07/22/2012 and benign. Musculoskeletal: No aggressive appearing focal osseous lesions. Mild degenerative changes in the thoracic spine. Review of the MIP images confirms the above findings. IMPRESSION: 1. No evidence of pulmonary embolism. 2. Severe centrilobular emphysema and diffuse bronchial wall thickening, in keeping with COPD. 3. No acute consolidative airspace disease. Electronically Signed   By: Ilona Sorrel M.D.   On: 08/30/2015 20:31   Mr Jeri Cos [id number] Contrast  08/30/2015  CLINICAL DATA:  Severe frontal and parietal headaches for 1 year. EXAM: MRI HEAD WITHOUT AND WITH CONTRAST TECHNIQUE: Multiplanar, multiecho pulse sequences of the brain and surrounding structures were obtained without and with intravenous contrast. CONTRAST:  67mL MULTIHANCE GADOBENATE DIMEGLUMINE 529 MG/ML IV SOLN COMPARISON:  Head CT 05/28/2015 and MRI 08/17/2014 FINDINGS: Nonexpanded partially empty sella is unchanged. There is no evidence of acute infarct, intracranial hemorrhage, intra-axial mass, midline shift, or extra-axial fluid collection. Ventricles and sulci are within normal limits for age. Patchy and confluent T2 hyperintensities throughout the subcortical and deep cerebral white matter bilaterally are unchanged and compatible with extensive chronic small vessel  ischemic disease. Chronic ischemic changes are also noted in the thalami and pons. Enhancing extra-axial mass projecting superiorly from the greater wing of the sphenoid on the left is unchanged, measuring 9 x 7 x 7 mm. There is no significant mass effect or edema in the adjacent left frontal lobe. No new enhancing lesions are identified. Orbits are unremarkable. Paranasal sinuses and mastoid air cells are clear. Major intracranial vascular flow voids are preserved. IMPRESSION: 1. No acute intracranial abnormality. 2. Extensive chronic small vessel ischemic disease. 3. Unchanged 9 mm meningioma along the left greater sphenoid wing. Electronically Signed   By: Logan Bores M.D.   On: 08/30/2015 16:08  Assessment/Plan Principal Problem:   Acute on chronic respiratory failure with hypoxia ([facility]) Active Problems:   GERD (gastroesophageal reflux disease)   DVT (deep venous thrombosis) ([facility])   HTN (hypertension)   COPD exacerbation ([place])   1. Acute on Chronic Respiratory failure with hypoxia -will admit to telemetry -CT chest reportedly negative for PE -started on therapy for COPD with IV steroids and abx -will continue with oxygen and titrate as needed  2. COPD exacerbation -continue with inhalers -started on duonebs -will continue with steroids -initiate GOLD protocol  3. GERD -continue with Pepcid  4. HTN -will continue with verapamil -monitor pressures  5. DVT -recently stopped anticoagulation -CT scan negative for PE  Code Status: full code DVT Prophylaxis:heparin Family Communication: none Disposition Plan: home   [facility] Pager [phone]

## 2015-08-30 NOTE — Progress Notes (Signed)
79 yo F with COPD exacerbation.  2L O2 at baseline for COPD.  Got steroids, CT chest negative for PE.  Going to tele.

## 2015-08-31 ENCOUNTER — Telehealth: Payer: Self-pay | Admitting: Adult Health

## 2015-08-31 DIAGNOSIS — E440 Moderate protein-calorie malnutrition: Secondary | ICD-10-CM | POA: Insufficient documentation

## 2015-08-31 DIAGNOSIS — J441 Chronic obstructive pulmonary disease with (acute) exacerbation: Principal | ICD-10-CM

## 2015-08-31 DIAGNOSIS — I10 Essential (primary) hypertension: Secondary | ICD-10-CM

## 2015-08-31 DIAGNOSIS — K219 Gastro-esophageal reflux disease without esophagitis: Secondary | ICD-10-CM

## 2015-08-31 DIAGNOSIS — J9621 Acute and chronic respiratory failure with hypoxia: Secondary | ICD-10-CM

## 2015-08-31 LAB — INFLUENZA PANEL BY PCR (TYPE A & B)
H1N1 flu by pcr: NOT DETECTED
Influenza A By PCR: NEGATIVE
Influenza B By PCR: NEGATIVE

## 2015-08-31 LAB — CBC
HCT: 36.3 % (ref 36.0–46.0)
HEMATOCRIT: 35.9 % — AB (ref 36.0–46.0)
HEMOGLOBIN: 10.5 g/dL — AB (ref 12.0–15.0)
Hemoglobin: 10.5 g/dL — ABNORMAL LOW (ref 12.0–15.0)
MCH: 22.3 pg — AB (ref 26.0–34.0)
MCH: 22.4 pg — ABNORMAL LOW (ref 26.0–34.0)
MCHC: 28.9 g/dL — ABNORMAL LOW (ref 30.0–36.0)
MCHC: 29.2 g/dL — ABNORMAL LOW (ref 30.0–36.0)
MCV: 77.1 fL — ABNORMAL LOW (ref 78.0–100.0)
MCV: 76.7 fL — AB (ref 78.0–100.0)
PLATELETS: 259 10*3/uL (ref 150–400)
Platelets: 253 10*3/uL (ref 150–400)
RBC: 4.71 MIL/uL (ref 3.87–5.11)
RBC: 4.68 MIL/uL (ref 3.87–5.11)
RDW: 14.8 % (ref 11.5–15.5)
RDW: 14.8 % (ref 11.5–15.5)
WBC: 3.9 10*3/uL — AB (ref 4.0–10.5)
WBC: 2.6 10*3/uL — AB (ref 4.0–10.5)

## 2015-08-31 LAB — CREATININE, SERUM
CREATININE: 0.6 mg/dL (ref 0.44–1.00)
GFR calc non Af Amer: >60 mL/min (ref >60)

## 2015-08-31 LAB — COMPREHENSIVE METABOLIC PANEL
ALBUMIN: 3.7 g/dL (ref 3.5–5.0)
ALK PHOS: 60 U/L (ref 38–126)
ALT: 22 U/L (ref 14–54)
AST: 49 U/L — AB (ref 15–41)
Anion gap: 8 (ref 5–15)
CALCIUM: 9.6 mg/dL (ref 8.9–10.3)
CO2: 39 mmol/L — ABNORMAL HIGH (ref 22–32)
CREATININE: 0.48 mg/dL (ref 0.44–1.00)
Chloride: 97 mmol/L — ABNORMAL LOW (ref 101–111)
GFR calc Af Amer: >60 mL/min (ref >60)
GLUCOSE: 161 mg/dL — AB (ref 65–99)
POTASSIUM: 4.2 mmol/L (ref 3.5–5.1)
Sodium: 144 mmol/L (ref 135–145)
Total Bilirubin: 0.4 mg/dL (ref 0.3–1.2)
Total Protein: 6.8 g/dL (ref 6.5–8.1)

## 2015-08-31 LAB — OCCULT BLOOD X 1 CARD TO LAB, STOOL: FECAL OCCULT BLD: NEGATIVE

## 2015-08-31 LAB — TROPONIN I
Troponin I: <0.03 ng/mL (ref <0.031)
Troponin I: <0.03 ng/mL (ref <0.031)

## 2015-08-31 LAB — GLUCOSE, CAPILLARY: GLUCOSE-CAPILLARY: 136 mg/dL — AB (ref 65–99)

## 2015-08-31 LAB — TSH: TSH: 0.256 u[IU]/mL — AB (ref 0.350–4.500)

## 2015-08-31 MED ORDER — ENSURE ENLIVE PO LIQD
237.0000 mL | Freq: Two times a day (BID) | ORAL | Status: DC
  Administered 2015-08-31 – 2015-09-02 (×5): 237 mL via ORAL

## 2015-08-31 MED ORDER — ALBUTEROL SULFATE (2.5 MG/3ML) 0.083% IN NEBU
2.5000 mg | INHALATION_SOLUTION | Freq: Three times a day (TID) | RESPIRATORY_TRACT | Status: DC
  Administered 2015-09-01 – 2015-09-02 (×5): 2.5 mg via RESPIRATORY_TRACT
  Filled 2015-08-31 (×6): qty 3

## 2015-08-31 MED ORDER — DEXTROSE 5 % IV SOLN
500.0000 mg | INTRAVENOUS | Status: DC
  Administered 2015-08-31 – 2015-09-02 (×3): 500 mg via INTRAVENOUS
  Filled 2015-08-31 (×4): qty 500

## 2015-08-31 MED ORDER — GUAIFENESIN-DM 100-10 MG/5ML PO SYRP
5.0000 mL | ORAL_SOLUTION | ORAL | Status: DC | PRN
  Administered 2015-08-31: 5 mL via ORAL
  Filled 2015-08-31: qty 5

## 2015-08-31 MED ORDER — CETYLPYRIDINIUM CHLORIDE 0.05 % MT LIQD
7.0000 mL | Freq: Two times a day (BID) | OROMUCOSAL | Status: DC
  Administered 2015-08-31 – 2015-09-02 (×6): 7 mL via OROMUCOSAL

## 2015-08-31 MED ORDER — FUROSEMIDE 40 MG PO TABS: 40.0000 mg | ORAL_TABLET | Freq: Once | ORAL | Status: DC

## 2015-08-31 NOTE — Progress Notes (Signed)
At 1530 received report from off going nurse.  Introduced self to pt as Teacher, music until 7p.  Call bell at reach.  Instructed to call for assistance.  Verbalized understanding.  Karie Kirks, Therapist, sports.

## 2015-08-31 NOTE — Progress Notes (Signed)
Utilization review completed. Mikya Don, RN, BSN. 

## 2015-08-31 NOTE — Progress Notes (Signed)
PROGRESS NOTE  KATHYE GEHLBACH [id number] DOB: 09-05-1936 DOA: 08/30/2015 PCP: Nance Pear., NP   HPI: Holly Page is a 79 y.o. female with prior history of COPD HTN DVT GERD presents with increased shortness of breath.   Subjective / 24 H Interval events - not feeling improved, still dyspneic with ambulation  Assessment/Plan: Principal Problem:   Acute on chronic respiratory failure with hypoxia (HCC) Active Problems:   GERD (gastroesophageal reflux disease)   DVT (deep venous thrombosis) (HCC)   HTN (hypertension)   COPD exacerbation (HCC)   Acute on Chronic Respiratory failure with hypoxia - CT chest reportedly negative for PE - started on therapy for COPD with IV steroids and abx, continue today  - will continue with oxygen and titrate as needed - she underwent a Lexiscan nuclear perfusion study that was negative for myocardial ischemia or left ventricular dysfunction in July 2016  COPD exacerbation - continue with Albuterol nebs, Pulmicort - will continue with steroids, antibiotics. Add azithromycin  - r/o influenza  GERD - continue with Pepcid  HTN - will continue with verapamil - monitor pressures, stable this morning  Superficial thrombophlebitis - recently stopped anticoagulation, she was on Xarelto, followed by Dr. Marin Olp - CT scan negative for PE   Diet: Diet heart healthy/carb modified Room service appropriate?: Yes; Fluid consistency:: Thin Fluids: none  DVT Prophylaxis: heparin  Code Status: Full Code Family Communication: no family bedside  Disposition Plan: home when ready   Barriers to discharge: dyspnea  Consultants:  None   Procedures:  None    Antibiotics Ceftriaxone 12/2 >> Azithromycin 12/2 >>   Studies  Dg Chest 2 View  08/30/2015  CLINICAL DATA:  Chest pain, dizziness, weakness EXAM: CHEST  2 VIEW COMPARISON:  04/12/2015 FINDINGS: Cardiomediastinal silhouette is stable. Hyperinflation again  noted. No acute infiltrate or pleural effusion. No pulmonary edema. Bony thorax is stable. IMPRESSION: No active cardiopulmonary disease.  Hyperinflation again noted. Electronically Signed   By: Lahoma Crocker M.D.   On: 08/30/2015 18:04   Ct Angio Chest Pe W/cm &/or Wo Cm  08/30/2015  CLINICAL DATA:  Shortness of breath. Weakness. Chest tightness. COPD . EXAM: CT ANGIOGRAPHY CHEST WITH CONTRAST TECHNIQUE: Multidetector CT imaging of the chest was performed using the standard protocol during bolus administration of intravenous contrast. Multiplanar CT image reconstructions and MIPs were obtained to evaluate the vascular anatomy. CONTRAST:  55mL OMNIPAQUE IOHEXOL 350 MG/ML SOLN COMPARISON:  Chest radiograph from earlier today. 03/19/2015 chest CT angiogram. FINDINGS: Mediastinum/Nodes: The study is high quality for the evaluation of pulmonary embolism. There are no filling defects in the central, lobar, segmental or subsegmental pulmonary artery branches to suggest acute pulmonary embolism. Atherosclerotic nonaneurysmal thoracic aorta. Main pulmonary diameter of 2.9 cm is within normal limits. Normal heart size. No pericardial fluid/thickening. Normal visualized thyroid. Normal esophagus. No pathologically enlarged axillary, mediastinal or hilar lymph nodes. Lungs/Pleura: No pneumothorax. No pleural effusion. Severe centrilobular emphysema and diffuse bronchial wall thickening. Subpleural 2.2 x 1.1 cm irregular focus of consolidation in the apical right upper lobe (series 6/ image 9) is stable since 05/28/2012, in keeping with benign pleural-parenchymal scarring. No acute consolidative airspace disease, new significant pulmonary nodules or lung masses. Upper abdomen: Stable granulomatous calcification in the right liver lobe. Stable 11 mm and 5 mm calcifications in the upper right kidney. Stable 5 mm calcification in the upper left kidney. Hyperdense 0.7 cm lesion in the lateral upper left kidney is too small to  characterize and not  appreciably changed since 09/13/2014, suggesting a benign lesion. Hyperdense 1.2 cm lesion in the lateral upper right kidney is indeterminate and stable in size since 07/22/2012 and benign. Musculoskeletal: No aggressive appearing focal osseous lesions. Mild degenerative changes in the thoracic spine. Review of the MIP images confirms the above findings. IMPRESSION: 1. No evidence of pulmonary embolism. 2. Severe centrilobular emphysema and diffuse bronchial wall thickening, in keeping with COPD. 3. No acute consolidative airspace disease. Electronically Signed   By: Ilona Sorrel M.D.   On: 08/30/2015 20:31   Mr Jeri Cos [id number] Contrast  08/30/2015  CLINICAL DATA:  Severe frontal and parietal headaches for 1 year. EXAM: MRI HEAD WITHOUT AND WITH CONTRAST TECHNIQUE: Multiplanar, multiecho pulse sequences of the brain and surrounding structures were obtained without and with intravenous contrast. CONTRAST:  82mL MULTIHANCE GADOBENATE DIMEGLUMINE 529 MG/ML IV SOLN COMPARISON:  Head CT 05/28/2015 and MRI 08/17/2014 FINDINGS: Nonexpanded partially empty sella is unchanged. There is no evidence of acute infarct, intracranial hemorrhage, intra-axial mass, midline shift, or extra-axial fluid collection. Ventricles and sulci are within normal limits for age. Patchy and confluent T2 hyperintensities throughout the subcortical and deep cerebral white matter bilaterally are unchanged and compatible with extensive chronic small vessel ischemic disease. Chronic ischemic changes are also noted in the thalami and pons. Enhancing extra-axial mass projecting superiorly from the greater wing of the sphenoid on the left is unchanged, measuring 9 x 7 x 7 mm. There is no significant mass effect or edema in the adjacent left frontal lobe. No new enhancing lesions are identified. Orbits are unremarkable. Paranasal sinuses and mastoid air cells are clear. Major intracranial vascular flow voids are preserved. IMPRESSION:  1. No acute intracranial abnormality. 2. Extensive chronic small vessel ischemic disease. 3. Unchanged 9 mm meningioma along the left greater sphenoid wing. Electronically Signed   By: Logan Bores M.D.   On: 08/30/2015 16:08    Objective  Filed Vitals:   08/31/15 0157 08/31/15 0506 08/31/15 1002 08/31/15 1006  BP:  145/63 142/76   Pulse: 76 68    Temp:  98.2 F (36.8 C)    TempSrc:  Oral    Resp: 18 20    Height:      Weight:  46.085 kg (101 lb 9.6 oz)    SpO2: 97% 100%  100%    Intake/Output Summary (Last 24 hours) at 08/31/15 1012 Last data filed at 08/31/15 0916  Gross per 24 hour  Intake 435.83 ml  Output    602 ml  Net -166.17 ml   Filed Weights   08/30/15 2254 08/31/15 0506  Weight: 45.904 kg (101 lb 3.2 oz) 46.085 kg (101 lb 9.6 oz)    Exam:  GENERAL: NAD, tachypneic  HEENT: no scleral icterus, PERRL  NECK: supple, no LAD  LUNGS: moves air well, + wheezing bilaterally   HEART: RRR without MRG  ABDOMEN: soft, non tender  EXTREMITIES: no clubbing / cyanosis  NEUROLOGIC: non focal   Data Reviewed: Basic Metabolic Panel:  Recent Labs Lab 08/30/15 1700 08/31/15 0130 08/31/15 0537  NA 141  --  144  K 3.3*  --  4.2  CL 96*  --  97*  CO2 39*  --  39*  GLUCOSE 118*  --  161*  BUN 7  --  <5*  CREATININE 0.47 0.60 0.48  CALCIUM 9.7  --  9.6   Liver Function Tests:  Recent Labs Lab 08/31/15 0537  AST 49*  ALT 22  ALKPHOS 60  BILITOT  0.4  PROT 6.8  ALBUMIN 3.7   CBC:  Recent Labs Lab 08/30/15 1700 08/31/15 0130 08/31/15 0537  WBC 5.9 3.9* 2.6*  NEUTROABS 4.0  --   --   HGB 11.4* 10.5* 10.5*  HCT 38.4 36.3 35.9*  MCV 76.0* 77.1* 76.7*  PLT 281 259 253   Cardiac Enzymes:  Recent Labs Lab 08/30/15 1700 08/31/15 0130 08/31/15 0537  TROPONINI <0.03 <0.03 <0.03   BNP (last 3 results)  Recent Labs  04/12/15 0853 08/30/15 1700  BNP 24.5 44.8    ProBNP (last 3 results)  Recent Labs  09/14/14 1246  PROBNP 49.2     CBG:  Recent Labs Lab 08/31/15 0701  GLUCAP 136*    Scheduled Meds: . albuterol  2.5 mg Nebulization Q6H  . antiseptic oral rinse  7 mL Mouth Rinse BID  . budesonide  0.25 mg Nebulization BID  . cefTRIAXone (ROCEPHIN)  IV  1 g Intravenous QHS  . famotidine  20 mg Oral QHS  . heparin  5,000 Units Subcutaneous 3 times per day  . loratadine  10 mg Oral Daily  . methylPREDNISolone (SOLU-MEDROL) injection  60 mg Intravenous Q6H  . potassium chloride SA  20 mEq Oral Daily  . sodium chloride  3 mL Intravenous Q12H  . verapamil  360 mg Oral Daily   Continuous Infusions: . sodium chloride 50 mL/hr at 08/31/15 0053    Marzetta Board, MD [facility] Pager [phone]. If 7 PM - 7 AM, please contact night-coverage at [email or web address], password [facility] 08/31/2015, 10:12 AM  LOS: 1 day

## 2015-08-31 NOTE — Progress Notes (Signed)
PT Cancellation Note  Patient Details Name: Holly Page MRN: [id number] DOB: 07/11/1936   Cancelled Treatment:    Reason Eval/Treat Not Completed: Other (comment) (has not had bath and declined until bath is done).  Will try later as time allows.   Ramond Dial 08/31/2015, 10:34 AM   Mee Hives, PT MS Acute Rehab Dept. Number: [phone] [id number] and [place] [phone]

## 2015-08-31 NOTE — Progress Notes (Signed)
Initial Nutrition Assessment  DOCUMENTATION CODES:   Non-severe (moderate) malnutrition in context of acute illness/injury, Underweight  INTERVENTION:  Provide Ensure Enlive po BID, each supplement provides 350 kcal and 20 grams of protein Encourage PO intake   NUTRITION DIAGNOSIS:   Malnutrition related to acute illness as evidenced by energy intake < or equal to 50% for > or equal to 5 days, moderate depletions of muscle mass, mild depletion of body fat, percent weight loss.   GOAL:   Patient will meet greater than or equal to 90% of their needs   MONITOR:   PO intake, Supplement acceptance, Labs, Weight trends, Skin, I & O's  REASON FOR ASSESSMENT:   Malnutrition Screening Tool, Consult Assessment of nutrition requirement/status  ASSESSMENT:   79 y.o. female with prior history of COPD HTN DVT GERD presents with increased shortness of breath. She has been having SOB for the last couple weeks.   Pt states that for the past 2-3 weeks she has had a very poor appetite and eating very little; she has been feeling weak and has little desire to prepare food. She reports losing from 115 lbs down to 101 lbs in the past 3 weeks- 11% weight loss is severe for time frame. She normally eats 3 daily meals with protein-rich foods and vegetables at most meals. She has moderate muscle wasting and mild fat wasting per nutrition-focused physical exam. She meets criteria for moderate malnutrition related to acute illness. She reports eating about 50% of breakfast this morning, felt too full to eat more. RD encouraged increasing PO intake through snacks and nutritional supplements. Pt agreeable to receiving Ensure Enlive.  Labs: low hemoglobin, low potassium, low chloride  Diet Order:  Diet heart healthy/carb modified Room service appropriate?: Yes; Fluid consistency:: Thin  Skin:  Reviewed, no issues  Last BM:  12/1  Height:   Ht Readings from Last 1 Encounters:  08/30/15 5\' 5"  (1.651 m)     Weight:   Wt Readings from Last 1 Encounters:  08/31/15 101 lb 9.6 oz (46.085 kg)    Ideal Body Weight:  56.8 kg  BMI:  Body mass index is 16.91 kg/(m^2). (underweight)  Estimated Nutritional Needs:   Kcal:  1400-1600  Protein:  70-80 grams  Fluid:  1.4-1.6 L/day  EDUCATION NEEDS:   No education needs identified at this time  [place], LDN Inpatient Clinical Dietitian Pager: [phone] After Hours Pager: [phone]

## 2015-08-31 NOTE — Evaluation (Signed)
Physical Therapy Evaluation Patient Details Name: Holly Page MRN: [id number] DOB: 05/04/1936 Today's Date: 08/31/2015   History of Present Illness  79 yo female with onset of severe HA's and diagnosed meningioma, had acute on chronic respiratory failure, with SOB, chest tightness and COPD.  Cleared PE   Clinical Impression  Pt was able to be assisted to walk in her room with IV pole to support and carry O2.  It is not clear if pt will need to be on O2 at home, but can be assessed before DC to see what ambulation values are.  Pt is clearly motivated to try, and will have HHPT follow up to support her and continue to assess her AD needs as she recovers.    Follow Up Recommendations Home health PT;Supervision/Assistance - 24 hour    Equipment Recommendations  Cane (if pt does not own one)    Recommendations for Other Services       Precautions / Restrictions Precautions Precautions: Fall (telemetry) Restrictions Weight Bearing Restrictions: No      Mobility  Bed Mobility Overal bed mobility: Modified Independent             General bed mobility comments: used HOB mildly elevated  Transfers Overall transfer level: Modified independent Equipment used: 1 person hand held assist (IV pole with O2 on it)             General transfer comment: reminders for hand placement  Ambulation/Gait Ambulation/Gait assistance: Min guard Ambulation Distance (Feet): 60 Feet (15 x4) Assistive device: 1 person hand held assist (IV pole to push with O2) Gait Pattern/deviations: Step-through pattern;Wide base of support;Trunk flexed Gait velocity: reduced Gait velocity interpretation: Below normal speed for age/gender General Gait Details: Had good control of standing only but needed contact from PT to move on IV pole and use O2  Stairs            Wheelchair Mobility    Modified Rankin (Stroke Patients Only)       Balance Overall balance assessment: Needs  assistance Sitting-balance support: Feet supported Sitting balance-Leahy Scale: Fair   Postural control: Posterior lean Standing balance support: Bilateral upper extremity supported Standing balance-Leahy Scale: Fair Standing balance comment: fair- dynamic balance                             Pertinent Vitals/Pain Pain Assessment: No/denies pain    Home Living Family/patient expects to be discharged to:: Private residence Living Arrangements: Children Available Help at Discharge: Family;Available PRN/intermittently Type of Home: House       Home Layout: One level Home Equipment: None      Prior Function Level of Independence: Independent               Hand Dominance        Extremity/Trunk Assessment   Upper Extremity Assessment: Overall WFL for tasks assessed           Lower Extremity Assessment: Generalized weakness      Cervical / Trunk Assessment: Normal  Communication   Communication: No difficulties  Cognition Arousal/Alertness: Awake/alert Behavior During Therapy: WFL for tasks assessed/performed Overall Cognitive Status: Within Functional Limits for tasks assessed                      General Comments General comments (skin integrity, edema, etc.): Pt has been able to don her socks in sitting but with gait is weak, has need  for an AD if going home in next couple days    Exercises        Assessment/Plan    PT Assessment Patient needs continued PT services  PT Diagnosis Difficulty walking   PT Problem List Decreased strength;Decreased range of motion;Decreased activity tolerance;Decreased balance;Decreased coordination;Decreased mobility;Decreased cognition;Decreased knowledge of use of DME;Decreased safety awareness;Cardiopulmonary status limiting activity;Decreased knowledge of precautions;Decreased skin integrity  PT Treatment Interventions DME instruction;Gait training;Stair training;Functional mobility  training;Balance training;Therapeutic exercise;Therapeutic activities;Neuromuscular re-education;Cognitive remediation;Patient/family education   PT Goals (Current goals can be found in the Care Plan section) Acute Rehab PT Goals Patient Stated Goal: to walk PT Goal Formulation: With patient/family Time For Goal Achievement: 09/14/15 Potential to Achieve Goals: Good    Frequency Min 3X/week   Barriers to discharge Decreased caregiver support home alone with no family living there     Co-evaluation               End of Session Equipment Utilized During Treatment: Oxygen Activity Tolerance: Patient tolerated treatment well;Treatment limited secondary to medical complications (Comment) Patient left: in bed;with call bell/phone within reach;with bed alarm set;with family/visitor present;with nursing/sitter in room Nurse Communication: Mobility status         Time: 1420-1445 PT Time Calculation (min) (ACUTE ONLY): 25 min   Charges:   PT Evaluation $Initial PT Evaluation Tier I: 1 Procedure PT Treatments $Gait Training: 8-22 mins   PT G CodesRamond Dial 09-23-2015, 3:29 PM   Mee Hives, PT MS Acute Rehab Dept. Number: [facility] [id number] and [place] [phone]

## 2015-08-31 NOTE — Telephone Encounter (Signed)
Will forward to TP as an [place]

## 2015-09-01 ENCOUNTER — Inpatient Hospital Stay (HOSPITAL_COMMUNITY): Payer: Medicare Other

## 2015-09-01 LAB — HEMOGLOBIN A1C
HEMOGLOBIN A1C: 5.9 % — AB (ref 4.8–5.6)
MEAN PLASMA GLUCOSE: 123 mg/dL

## 2015-09-01 LAB — GLUCOSE, CAPILLARY: GLUCOSE-CAPILLARY: 121 mg/dL — AB (ref 65–99)

## 2015-09-01 NOTE — Progress Notes (Signed)
PROGRESS NOTE  Holly Page [id number] DOB: 09/08/1936 DOA: 08/30/2015 PCP: Nance Pear., NP   HPI: Holly Page is a 79 y.o. female with prior history of COPD HTN DVT GERD presents with increased shortness of breath.   Subjective / 24 H Interval events - breathing with some improvement - endorses abdominal pain, acute on chronic, for few months - intermittent diarrhea for past year - very anxious today with multiple chronic complaints   Assessment/Plan: Principal Problem:   Acute on chronic respiratory failure with hypoxia (HCC) Active Problems:   GERD (gastroesophageal reflux disease)   DVT (deep venous thrombosis) (HCC)   HTN (hypertension)   COPD exacerbation (HCC)   Malnutrition of moderate degree   Acute on Chronic Respiratory failure with hypoxia - CT chest reportedly negative for PE - started on therapy for COPD with IV steroids and abx, continue today  - will continue with oxygen and titrate as needed - she underwent a Lexiscan nuclear perfusion study that was negative for myocardial ischemia or left ventricular dysfunction in July 2016  Abdominal pain  - plain XR today  - discussed with patient that this is chronic and may not get an answer right away. She is extremely anxious about her different pains in her abdomen, chest, back, all chronic problems with negative workup in the past. She is very anxious   COPD exacerbation - continue with Albuterol nebs, Pulmicort - will continue with steroids, antibiotics. Add azithromycin  - influenza negative  GERD - continue with Pepcid  HTN - will continue with verapamil - monitor pressures, stable this morning  Superficial thrombophlebitis - recently stopped anticoagulation, she was on Xarelto, followed by Dr. Marin Olp - CT scan negative for PE  Diet: Diet Heart Room service appropriate?: Yes; Fluid consistency:: Thin Fluids: none  DVT Prophylaxis: heparin  Code Status: Full  Code Family Communication: no family bedside  Disposition Plan: home when ready   Barriers to discharge: dyspnea  Consultants:  None   Procedures:  None    Antibiotics Ceftriaxone 12/2 >> Azithromycin 12/2 >>   Studies  Dg Chest 2 View  08/30/2015  CLINICAL DATA:  Chest pain, dizziness, weakness EXAM: CHEST  2 VIEW COMPARISON:  04/12/2015 FINDINGS: Cardiomediastinal silhouette is stable. Hyperinflation again noted. No acute infiltrate or pleural effusion. No pulmonary edema. Bony thorax is stable. IMPRESSION: No active cardiopulmonary disease.  Hyperinflation again noted. Electronically Signed   By: Lahoma Crocker M.D.   On: 08/30/2015 18:04   Ct Angio Chest Pe W/cm &/or Wo Cm  08/30/2015  CLINICAL DATA:  Shortness of breath. Weakness. Chest tightness. COPD . EXAM: CT ANGIOGRAPHY CHEST WITH CONTRAST TECHNIQUE: Multidetector CT imaging of the chest was performed using the standard protocol during bolus administration of intravenous contrast. Multiplanar CT image reconstructions and MIPs were obtained to evaluate the vascular anatomy. CONTRAST:  45mL OMNIPAQUE IOHEXOL 350 MG/ML SOLN COMPARISON:  Chest radiograph from earlier today. 03/19/2015 chest CT angiogram. FINDINGS: Mediastinum/Nodes: The study is high quality for the evaluation of pulmonary embolism. There are no filling defects in the central, lobar, segmental or subsegmental pulmonary artery branches to suggest acute pulmonary embolism. Atherosclerotic nonaneurysmal thoracic aorta. Main pulmonary diameter of 2.9 cm is within normal limits. Normal heart size. No pericardial fluid/thickening. Normal visualized thyroid. Normal esophagus. No pathologically enlarged axillary, mediastinal or hilar lymph nodes. Lungs/Pleura: No pneumothorax. No pleural effusion. Severe centrilobular emphysema and diffuse bronchial wall thickening. Subpleural 2.2 x 1.1 cm irregular focus of consolidation in the apical right  upper lobe (series 6/ image 9) is  stable since 05/28/2012, in keeping with benign pleural-parenchymal scarring. No acute consolidative airspace disease, new significant pulmonary nodules or lung masses. Upper abdomen: Stable granulomatous calcification in the right liver lobe. Stable 11 mm and 5 mm calcifications in the upper right kidney. Stable 5 mm calcification in the upper left kidney. Hyperdense 0.7 cm lesion in the lateral upper left kidney is too small to characterize and not appreciably changed since 09/13/2014, suggesting a benign lesion. Hyperdense 1.2 cm lesion in the lateral upper right kidney is indeterminate and stable in size since 07/22/2012 and benign. Musculoskeletal: No aggressive appearing focal osseous lesions. Mild degenerative changes in the thoracic spine. Review of the MIP images confirms the above findings. IMPRESSION: 1. No evidence of pulmonary embolism. 2. Severe centrilobular emphysema and diffuse bronchial wall thickening, in keeping with COPD. 3. No acute consolidative airspace disease. Electronically Signed   By: Ilona Sorrel M.D.   On: 08/30/2015 20:31   Mr Jeri Cos [id number] Contrast  08/30/2015  CLINICAL DATA:  Severe frontal and parietal headaches for 1 year. EXAM: MRI HEAD WITHOUT AND WITH CONTRAST TECHNIQUE: Multiplanar, multiecho pulse sequences of the brain and surrounding structures were obtained without and with intravenous contrast. CONTRAST:  37mL MULTIHANCE GADOBENATE DIMEGLUMINE 529 MG/ML IV SOLN COMPARISON:  Head CT 05/28/2015 and MRI 08/17/2014 FINDINGS: Nonexpanded partially empty sella is unchanged. There is no evidence of acute infarct, intracranial hemorrhage, intra-axial mass, midline shift, or extra-axial fluid collection. Ventricles and sulci are within normal limits for age. Patchy and confluent T2 hyperintensities throughout the subcortical and deep cerebral white matter bilaterally are unchanged and compatible with extensive chronic small vessel ischemic disease. Chronic ischemic changes are  also noted in the thalami and pons. Enhancing extra-axial mass projecting superiorly from the greater wing of the sphenoid on the left is unchanged, measuring 9 x 7 x 7 mm. There is no significant mass effect or edema in the adjacent left frontal lobe. No new enhancing lesions are identified. Orbits are unremarkable. Paranasal sinuses and mastoid air cells are clear. Major intracranial vascular flow voids are preserved. IMPRESSION: 1. No acute intracranial abnormality. 2. Extensive chronic small vessel ischemic disease. 3. Unchanged 9 mm meningioma along the left greater sphenoid wing. Electronically Signed   By: Logan Bores M.D.   On: 08/30/2015 16:08    Objective  Filed Vitals:   08/31/15 1234 08/31/15 2236 09/01/15 0629 09/01/15 0756  BP: 90/55 155/74 134/77   Pulse: 74 70 78   Temp: 97.9 F (36.6 C) 97.8 F (36.6 C) 97.3 F (36.3 C)   TempSrc: Oral Oral Oral   Resp: 18 17 18    Height:      Weight:   46.448 kg (102 lb 6.4 oz)   SpO2: 100% 100% 100% 100%    Intake/Output Summary (Last 24 hours) at 09/01/15 1220 Last data filed at 09/01/15 0949  Gross per 24 hour  Intake  997.5 ml  Output    200 ml  Net  797.5 ml   Filed Weights   08/30/15 2254 08/31/15 0506 09/01/15 0629  Weight: 45.904 kg (101 lb 3.2 oz) 46.085 kg (101 lb 9.6 oz) 46.448 kg (102 lb 6.4 oz)   Exam:  GENERAL: NAD, tachypneic  HEENT: no scleral icterus, PERRL  NECK: supple, no LAD  LUNGS: moves air well, + wheezing bilaterally   HEART: RRR without MRG  ABDOMEN: soft, non tender  EXTREMITIES: no clubbing / cyanosis  NEUROLOGIC: non focal  Data Reviewed: Basic Metabolic Panel:  Recent Labs Lab 08/30/15 1700 08/31/15 0130 08/31/15 0537  NA 141  --  144  K 3.3*  --  4.2  CL 96*  --  97*  CO2 39*  --  39*  GLUCOSE 118*  --  161*  BUN 7  --  <5*  CREATININE 0.47 0.60 0.48  CALCIUM 9.7  --  9.6   Liver Function Tests:  Recent Labs Lab 08/31/15 0537  AST 49*  ALT 22  ALKPHOS 60   BILITOT 0.4  PROT 6.8  ALBUMIN 3.7   CBC:  Recent Labs Lab 08/30/15 1700 08/31/15 0130 08/31/15 0537  WBC 5.9 3.9* 2.6*  NEUTROABS 4.0  --   --   HGB 11.4* 10.5* 10.5*  HCT 38.4 36.3 35.9*  MCV 76.0* 77.1* 76.7*  PLT 281 259 253   Cardiac Enzymes:  Recent Labs Lab 08/30/15 1700 08/31/15 0130 08/31/15 0537 08/31/15 1115  TROPONINI <0.03 <0.03 <0.03 <0.03   BNP (last 3 results)  Recent Labs  04/12/15 0853 08/30/15 1700  BNP 24.5 44.8    ProBNP (last 3 results)  Recent Labs  09/14/14 1246  PROBNP 49.2    CBG:  Recent Labs Lab 08/31/15 0701 09/01/15 0654  GLUCAP 136* 121*   Scheduled Meds: . albuterol  2.5 mg Nebulization TID  . antiseptic oral rinse  7 mL Mouth Rinse BID  . azithromycin  500 mg Intravenous Q24H  . budesonide  0.25 mg Nebulization BID  . cefTRIAXone (ROCEPHIN)  IV  1 g Intravenous QHS  . famotidine  20 mg Oral QHS  . feeding supplement (ENSURE ENLIVE)  237 mL Oral BID BM  . heparin  5,000 Units Subcutaneous 3 times per day  . loratadine  10 mg Oral Daily  . methylPREDNISolone (SOLU-MEDROL) injection  60 mg Intravenous Q6H  . potassium chloride SA  20 mEq Oral Daily  . sodium chloride  3 mL Intravenous Q12H  . verapamil  360 mg Oral Daily   Continuous Infusions: . sodium chloride 50 mL/hr at 09/01/15 1048   Marzetta Board, MD [facility] Pager [phone]. If 7 PM - 7 AM, please contact night-coverage at [email or web address], password [facility] 09/01/2015, 12:20 PM  LOS: 2 days

## 2015-09-01 NOTE — Progress Notes (Signed)
Patient refusing bed alarm, educated patient on purpose of it for safety reasons. Patient continued to refuse. Will continue to monitor and offer bathroom assistance.  Tresa Endo

## 2015-09-02 ENCOUNTER — Telehealth: Payer: Self-pay | Admitting: Family

## 2015-09-02 DIAGNOSIS — E440 Moderate protein-calorie malnutrition: Secondary | ICD-10-CM

## 2015-09-02 LAB — GLUCOSE, CAPILLARY: GLUCOSE-CAPILLARY: 114 mg/dL — AB (ref 65–99)

## 2015-09-02 MED ORDER — PREDNISONE 20 MG PO TABS: 40.0000 mg | ORAL_TABLET | Freq: Every day | ORAL | Status: DC

## 2015-09-02 MED ORDER — LEVOFLOXACIN 500 MG PO TABS: 500.0000 mg | ORAL_TABLET | Freq: Every day | ORAL | Status: DC

## 2015-09-02 NOTE — Telephone Encounter (Signed)
Please contact patient for transitional care follow up.

## 2015-09-02 NOTE — Care Management Note (Signed)
Case Management Note  Patient Details  Name: Holly Page MRN: [id number] Date of Birth: 08/05/1936  Subjective/Objective:                  Chief Complaint: Shortness of Breath  Action/Plan: CM Spoke to patient at the bedside. Patient said that she plans to be discharged home today. Patient said that she lives at home and has adult children who are able to check in on her as needed. Patient refusing to have 24/7 supervision and MD Gherghe made aware. Patient said that she has a walker and 3N1 and does not need any other DME. CM Offered patient choice for [facility] RN, Aide, PT/OT and patient chose [facility]. CM called Tiffany with [facility] to advise of referral and patient referral accepted.  Cm advised patient of [place] and patient verbalized understanding. Patient said that she wears oxygen at home and gets that through [facility]. No further DME needs per patient and patient declines needing any additional medication or HH needs. CM remains available should additional needs arise.   Expected Discharge Date:  09/02/15                Expected Discharge Plan:  [place]  In-House Referral:     Discharge planning Services  CM Consult  Post Acute Care Choice:    Choice offered to:  Patient  DME Arranged:    DME Agency:     HH Arranged:  RN, PT, OT, Nurse's Aide [place] Agency:  [place]  Status of Service:  Completed, signed off  Medicare Important Message Given:    Date Medicare IM Given:    Medicare IM give by:    Date Additional Medicare IM Given:    Additional Medicare Important Message give by:     If discussed at [place] of Stay Meetings, dates discussed:    Additional Comments:  Guido Sander, RN 09/02/2015, 4:32 PM

## 2015-09-02 NOTE — Discharge Instructions (Signed)
Follow with Holly Page,Holly S., NP in 5-7 days ° °Please get a complete blood count and chemistry panel checked by your Primary MD at your next visit, and again as instructed by your Primary MD. Please get your medications reviewed and adjusted by your Primary MD. ° °Please request your Primary MD to go over all Hospital Tests and Procedure/Radiological results at the follow up, please get all Hospital records sent to your Prim MD by signing hospital release before you go home. ° °If you had Pneumonia of Lung problems at the Hospital: °Please get a 2 view Chest X ray done in 6-8 weeks after hospital discharge or sooner if instructed by your Primary MD. ° °If you have Congestive Heart Failure: °Please call your Cardiologist or Primary MD anytime you have any of the following symptoms:  °1) 3 pound weight gain in 24 hours or 5 pounds in 1 week  °2) shortness of breath, with or without a dry hacking cough  °3) swelling in the hands, feet or stomach  °4) if you have to sleep on extra pillows at night in order to breathe ° °Follow cardiac low salt diet and 1.5 lit/day fluid restriction. ° °If you have diabetes °Accuchecks 4 times/day, Once in AM empty stomach and then before each meal. °Log in all results and show them to your primary doctor at your next visit. °If any glucose reading is under 80 or above 300 call your primary MD immediately. ° °If you have Seizure/Convulsions/Epilepsy: °Please do not drive, operate heavy machinery, participate in activities at heights or participate in high speed sports until you have seen by Primary MD or a Neurologist and advised to do so again. ° °If you had Gastrointestinal Bleeding: °Please ask your Primary MD to check a complete blood count within one week of discharge or at your next visit. Your endoscopic/colonoscopic biopsies that are pending at the time of discharge, will also need to followed by your Primary MD. ° °Get Medicines reviewed and adjusted. °Please take all your  medications with you for your next visit with your Primary MD ° °Please request your Primary MD to go over all hospital tests and procedure/radiological results at the follow up, please ask your Primary MD to get all Hospital records sent to his/her office. ° °If you experience worsening of your admission symptoms, develop shortness of breath, life threatening emergency, suicidal or homicidal thoughts you must seek medical attention immediately by calling 911 or calling your MD immediately  if symptoms less severe. ° °You must read complete instructions/literature along with all the possible adverse reactions/side effects for all the Medicines you take and that have been prescribed to you. Take any new Medicines after you have completely understood and accpet all the possible adverse reactions/side effects.  ° °Do not drive or operate heavy machinery when taking Pain medications.  ° °Do not take more than prescribed Pain, Sleep and Anxiety Medications ° °Special Instructions: If you have smoked or chewed Tobacco  in the last 2 yrs please stop smoking, stop any regular Alcohol  and or any Recreational drug use. ° °Wear Seat belts while driving. ° °Please note °You were cared for by a hospitalist during your hospital stay. If you have any questions about your discharge medications or the care you received while you were in the hospital after you are discharged, you can call the unit and asked to speak with the hospitalist on call if the hospitalist that took care of you is not available. Once   you are discharged, your primary care physician will handle any further medical issues. Please note that NO REFILLS for any discharge medications will be authorized once you are discharged, as it is imperative that you return to your primary care physician (or establish a relationship with a primary care physician if you do not have one) for your aftercare needs so that they can reassess your need for medications and monitor your  lab values.  You can reach the hospitalist office at phone [phone] or fax [phone]   If you do not have a primary care physician, you can call [phone] for a physician referral.  Activity: As tolerated with Full fall precautions use walker/cane & assistance as needed  Disposition Home

## 2015-09-02 NOTE — Progress Notes (Signed)
1642 pt's ride arrived with home 02.  Discharged instructions explained prior and verbalized understanding.  D/c off floor via w/c to awaiting transport.  Karie Kirks, Therapist, sports.

## 2015-09-02 NOTE — Evaluation (Addendum)
Occupational Therapy Evaluation Patient Details Name: Holly Page MRN: [id number] DOB: November 30, 1935 Today's Date: 09/02/2015    History of Present Illness 79 y.o. female with onset of severe HA's and diagnosed meningioma, had acute on chronic respiratory failure, with SOB, chest tightness and COPD.  Cleared PE    Clinical Impression   Pt admitted with above. Pt discharge order signed in chart, so deferring all further OT needs to next venue of care. Recommending HHOT upon d/c.     Follow Up Recommendations  Home health OT;Supervision - Intermittent    Equipment Recommendations  3 in 1 bedside comode   Recommendations for Other Services       Precautions / Restrictions Precautions Precautions: Fall Restrictions Weight Bearing Restrictions: No      Mobility Bed Mobility Overal bed mobility: Modified Independent                Transfers Overall transfer level: Needs assistance   Transfers: Sit to/from Stand Sit to Stand: Supervision              Balance       No LOB in session.                                       ADL Overall ADL's : Needs assistance/impaired     Grooming: Set up;Supervision/safety;Standing               Lower Body Dressing: Set up;Supervision/safety;Sit to/from stand   Toilet Transfer: Min guard;Ambulation;Regular Toilet (supervision-sit to stand)   Toileting- Water quality scientist and Hygiene: Supervision/safety;Sit to/from stand       Functional mobility during ADLs: Min guard (Pushed IV pole and also walked without it) General ADL Comments: Educated on energy conservation techniques. Recommended pt not get all the way down in the tub. Educated on 3 in 1.  Gave pt energy conservation handout.      Vision     Perception     Praxis      Pertinent Vitals/Pain Pain Assessment: No/denies pain     Hand Dominance     Extremity/Trunk Assessment Upper Extremity Assessment Upper Extremity  Assessment: Overall WFL for tasks assessed   Lower Extremity Assessment Lower Extremity Assessment: Defer to PT evaluation       Communication Communication Communication: No difficulties   Cognition Arousal/Alertness: Awake/alert Behavior During Therapy: WFL for tasks assessed/performed Overall Cognitive Status: No caregiver present to determine baseline; decreased safety awareness with lines                     General Comments       Exercises       Shoulder Instructions      Home Living Family/patient expects to be discharged to:: Private residence Living Arrangements: Children Available Help at Discharge: Family;Available PRN/intermittently Type of Home: House       Home Layout: One level         Bathroom Toilet: Standard Bathroom Accessibility: No   Home Equipment: None          Prior Functioning/Environment Level of Independence: Independent             OT Diagnosis: Generalized weakness   OT Problem List: Decreased strength;Decreased knowledge of use of DME or AE;Decreased knowledge of precautions;Decreased activity tolerance;Decreased safety awareness   OT Treatment/Interventions: Self-care/ADL training;DME and/or AE instruction;Therapeutic activities;Patient/family education;Balance training; energy conservation  OT Goals(Current goals can be found in the care plan section) Acute Rehab OT Goals Patient Stated Goal: walking, going to the store, being independent  OT Frequency: Min 2X/week   Barriers to D/C:            Co-evaluation              End of Session Equipment Utilized During Treatment: Gait belt;Oxygen  Activity Tolerance: Patient tolerated treatment well Patient left: in bed;with call bell/phone within reach;with bed alarm set   Time: [id number] OT Time Calculation (min): 16 min Charges:  OT General Charges $OT Visit: 1 Procedure OT Evaluation $Initial OT Evaluation Tier I: 1 Procedure G-CodesBenito Mccreedy OTR/L [id number] 09/02/2015, 11:27 AM

## 2015-09-02 NOTE — Discharge Summary (Signed)
Physician Discharge Summary  Holly Page [id number] DOB: [birth year] DOA: 08/30/2015  PCP: Nance Pear., NP  Admit date: 08/30/2015 Discharge date: 09/02/2015  Time spent: > 30 minutes  Recommendations for Outpatient Follow-up:  1. Follow up with Debbrah Alar in 1-2 weeks 2. Follow up with Pulmonology as scheduled   Discharge Diagnoses:  Principal Problem:   Acute on chronic respiratory failure with hypoxia (HCC) Active Problems:   GERD (gastroesophageal reflux disease)   DVT (deep venous thrombosis) (HCC)   HTN (hypertension)   COPD exacerbation (HCC)   Malnutrition of moderate degree  Discharge Condition: stable  Diet recommendation: heart healthy  Filed Weights   08/31/15 0506 09/01/15 0629 09/02/15 0526  Weight: 46.085 kg (101 lb 9.6 oz) 46.448 kg (102 lb 6.4 oz) 48.254 kg (106 lb 6.1 oz)    History of present illness:  See H&P, Labs, Consult and Test reports for all details in brief, patient is a 79 y.o. female with prior history of COPD HTN DVT GERD presents with increased shortness of breath.   Hospital Course:  Acute on Chronic Respiratory failure with hypoxia - CT chest done on admission was negative for PE, she was started on therapy for COPD exacerbation with IV steroids and abx, with significant improvement in her respiratory status, on the day of discharge she was at baseline, able to ambulate in the room and hallway without significant dyspnea, PT evaluated patients and recommended HHPT which was arranged. She will be discharged on Levofloxacin to complete additional 4 days and on a prednisone taper. Of note, she recently had cardiac evaluation for her chronic dyspnea, underwent a Lexiscan nuclear perfusion study that was negative for myocardial ischemia or left ventricular dysfunction in July 2016 Abdominal pain - plain XR without acute findings, this is chronic.  COPD exacerbation - influenza negative, antibiotics and prednisone taper as  above.  GERD - continue with Pepcid HTN - will continue with verapamil Superficial thrombophlebitis - recently stopped anticoagulation, she was on Xarelto, followed by Dr. Marin Olp.  CT scan negative for PE  Procedures:  None    Consultations:  None   Discharge Exam: Filed Vitals:   09/02/15 0526 09/02/15 0840 09/02/15 1334 09/02/15 1423  BP: 149/69  168/75   Pulse: 76     Temp: 97.6 F (36.4 C)     TempSrc: Oral     Resp: 18     Height:      Weight: 48.254 kg (106 lb 6.1 oz)     SpO2: 100% 100%  100%    General: MAD Cardiovascular: RRR Respiratory: CTA biL  Discharge Instructions Activity:  As tolerated   Get Medicines reviewed and adjusted: Please take all your medications with you for your next visit with your Primary MD  Please request your Primary MD to go over all hospital tests and procedure/radiological results at the follow up, please ask your Primary MD to get all Hospital records sent to his/her office.  If you experience worsening of your admission symptoms, develop shortness of breath, life threatening emergency, suicidal or homicidal thoughts you must seek medical attention immediately by calling 911 or calling your MD immediately if symptoms less severe.  You must read complete instructions/literature along with all the possible adverse reactions/side effects for all the Medicines you take and that have been prescribed to you. Take any new Medicines after you have completely understood and accpet all the possible adverse reactions/side effects.   Do not drive when taking Pain medications.  Do not take more than prescribed Pain, Sleep and Anxiety Medications  Special Instructions: If you have smoked or chewed Tobacco in the last 2 yrs please stop smoking, stop any regular Alcohol and or any Recreational drug use.  Wear Seat belts while driving.  Please note  You were cared for by a hospitalist during your hospital stay. Once you are  discharged, your primary care physician will handle any further medical issues. Please note that NO REFILLS for any discharge medications will be authorized once you are discharged, as it is imperative that you return to your primary care physician (or establish a relationship with a primary care physician if you do not have one) for your aftercare needs so that they can reassess your need for medications and monitor your lab values.    Medication List    TAKE these medications        acetaminophen-codeine 300-30 MG tablet  Commonly known as:  TYLENOL #3  Take 1-2 tablets by mouth every 8 (eight) hours as needed for moderate pain or severe pain.     albuterol 108 (90 BASE) MCG/ACT inhaler  Commonly known as:  PROVENTIL HFA;VENTOLIN HFA  Inhale 2 puffs into the lungs every 6 (six) hours as needed for wheezing or shortness of breath.     albuterol (2.5 MG/3ML) 0.083% nebulizer solution  Commonly known as:  PROVENTIL  Take 3 mLs (2.5 mg total) by nebulization every 6 (six) hours as needed for shortness of breath. And as needed     budesonide 0.25 MG/2ML nebulizer solution  Commonly known as:  PULMICORT  Take 2 mLs (0.25 mg total) by nebulization 2 (two) times daily. DX: J43.9     cholecalciferol 1000 UNITS tablet  Commonly known as:  VITAMIN D  Take 1,000 Units by mouth daily.     levofloxacin 500 MG tablet  Commonly known as:  LEVAQUIN  Take 1 tablet (500 mg total) by mouth daily.     loratadine 10 MG tablet  Commonly known as:  CLARITIN  Take 1 tablet (10 mg total) by mouth daily.     MULTIVITAMIN & MINERAL PO  Take 1 tablet by mouth daily.     potassium chloride SA 20 MEQ tablet  Commonly known as:  K-DUR,KLOR-CON  Take 1 tablet (20 mEq total) by mouth daily.     predniSONE 20 MG tablet  Commonly known as:  DELTASONE  Take 2 tablets (40 mg total) by mouth daily with breakfast. 2 tablets daily for 3 days then 1 tablet daily for 3 days then 1/2 tablet daily for 4 days.      ranitidine 300 MG tablet  Commonly known as:  ZANTAC  Take 1 tablet (300 mg total) by mouth at bedtime. 1 tablet at bedtime     SLOW IRON 160 (50 FE) MG Tbcr SR tablet  Generic drug:  ferrous sulfate  Take 1 tablet by mouth daily.     sodium chloride 0.65 % Soln nasal spray  Commonly known as:  OCEAN  Place 2 sprays into both nostrils as needed for congestion.     verapamil 240 MG CR tablet  Commonly known as:  CALAN-SR  Take 1.5 tablets (360 mg total) by mouth daily.           Follow-up Information    Follow up with Nance Pear., NP. Schedule an appointment as soon as possible for a visit in 1 week.   Specialty:  Internal Medicine   Contact information:  [place] [address] [phone]       Follow up with [facility].   Why:  Home health for physical therapy, occupational therapy, registered nurse, aide; they will call you to schedule within 24-48 hours.    Contact information:   [address] [place] [address] [phone]       The results of significant diagnostics from this hospitalization (including imaging, microbiology, ancillary and laboratory) are listed below for reference.    Significant Diagnostic Studies: Dg Chest 2 View  08/30/2015  CLINICAL DATA:  Chest pain, dizziness, weakness EXAM: CHEST  2 VIEW COMPARISON:  04/12/2015 FINDINGS: Cardiomediastinal silhouette is stable. Hyperinflation again noted. No acute infiltrate or pleural effusion. No pulmonary edema. Bony thorax is stable. IMPRESSION: No active cardiopulmonary disease.  Hyperinflation again noted. Electronically Signed   By: Lahoma Crocker M.D.   On: 08/30/2015 18:04   Ct Angio Chest Pe W/cm &/or Wo Cm  08/30/2015  CLINICAL DATA:  Shortness of breath. Weakness. Chest tightness. COPD . EXAM: CT ANGIOGRAPHY CHEST WITH CONTRAST TECHNIQUE: Multidetector CT imaging of the chest was performed using the standard protocol during bolus  administration of intravenous contrast. Multiplanar CT image reconstructions and MIPs were obtained to evaluate the vascular anatomy. CONTRAST:  7mL OMNIPAQUE IOHEXOL 350 MG/ML SOLN COMPARISON:  Chest radiograph from earlier today. 03/19/2015 chest CT angiogram. FINDINGS: Mediastinum/Nodes: The study is high quality for the evaluation of pulmonary embolism. There are no filling defects in the central, lobar, segmental or subsegmental pulmonary artery branches to suggest acute pulmonary embolism. Atherosclerotic nonaneurysmal thoracic aorta. Main pulmonary diameter of 2.9 cm is within normal limits. Normal heart size. No pericardial fluid/thickening. Normal visualized thyroid. Normal esophagus. No pathologically enlarged axillary, mediastinal or hilar lymph nodes. Lungs/Pleura: No pneumothorax. No pleural effusion. Severe centrilobular emphysema and diffuse bronchial wall thickening. Subpleural 2.2 x 1.1 cm irregular focus of consolidation in the apical right upper lobe (series 6/ image 9) is stable since 05/28/2012, in keeping with benign pleural-parenchymal scarring. No acute consolidative airspace disease, new significant pulmonary nodules or lung masses. Upper abdomen: Stable granulomatous calcification in the right liver lobe. Stable 11 mm and 5 mm calcifications in the upper right kidney. Stable 5 mm calcification in the upper left kidney. Hyperdense 0.7 cm lesion in the lateral upper left kidney is too small to characterize and not appreciably changed since 09/13/2014, suggesting a benign lesion. Hyperdense 1.2 cm lesion in the lateral upper right kidney is indeterminate and stable in size since 07/22/2012 and benign. Musculoskeletal: No aggressive appearing focal osseous lesions. Mild degenerative changes in the thoracic spine. Review of the MIP images confirms the above findings. IMPRESSION: 1. No evidence of pulmonary embolism. 2. Severe centrilobular emphysema and diffuse bronchial wall thickening, in  keeping with COPD. 3. No acute consolidative airspace disease. Electronically Signed   By: Ilona Sorrel M.D.   On: 08/30/2015 20:31   Mr Jeri Cos [id number] Contrast  08/30/2015  CLINICAL DATA:  Severe frontal and parietal headaches for 1 year. EXAM: MRI HEAD WITHOUT AND WITH CONTRAST TECHNIQUE: Multiplanar, multiecho pulse sequences of the brain and surrounding structures were obtained without and with intravenous contrast. CONTRAST:  76mL MULTIHANCE GADOBENATE DIMEGLUMINE 529 MG/ML IV SOLN COMPARISON:  Head CT 05/28/2015 and MRI 08/17/2014 FINDINGS: Nonexpanded partially empty sella is unchanged. There is no evidence of acute infarct, intracranial hemorrhage, intra-axial mass, midline shift, or extra-axial fluid collection. Ventricles and sulci are within normal limits for age.  Patchy and confluent T2 hyperintensities throughout the subcortical and deep cerebral white matter bilaterally are unchanged and compatible with extensive chronic small vessel ischemic disease. Chronic ischemic changes are also noted in the thalami and pons. Enhancing extra-axial mass projecting superiorly from the greater wing of the sphenoid on the left is unchanged, measuring 9 x 7 x 7 mm. There is no significant mass effect or edema in the adjacent left frontal lobe. No new enhancing lesions are identified. Orbits are unremarkable. Paranasal sinuses and mastoid air cells are clear. Major intracranial vascular flow voids are preserved. IMPRESSION: 1. No acute intracranial abnormality. 2. Extensive chronic small vessel ischemic disease. 3. Unchanged 9 mm meningioma along the left greater sphenoid wing. Electronically Signed   By: Logan Bores M.D.   On: 08/30/2015 16:08   Dg Abd Portable 1v  09/01/2015  CLINICAL DATA:  Umbilical and hypogastric pain.  Nausea for 3 days. EXAM: PORTABLE ABDOMEN - 1 VIEW COMPARISON:  Abdominal CT 07/17/2015 FINDINGS: Nonobstructive bowel gas pattern. Pelvic calcifications correlate with calcified uterine  fibroids, atherosclerosis, and phleboliths. Cholecystectomy clips. There is bilateral nephrolithiasis which is underestimated relative to prior CT. No definitive ureteral calculus, but limited by vascular calcifications. Flat diaphragm from COPD and emphysema. IMPRESSION: Nonobstructive bowel gas pattern. Electronically Signed   By: Monte Fantasia M.D.   On: 09/01/2015 14:44    Microbiology: No results found for this or any previous visit (from the past 240 hour(s)).   Labs: Basic Metabolic Panel:  Recent Labs Lab 08/30/15 1700 08/31/15 0130 08/31/15 0537  NA 141  --  144  K 3.3*  --  4.2  CL 96*  --  97*  CO2 39*  --  39*  GLUCOSE 118*  --  161*  BUN 7  --  <5*  CREATININE 0.47 0.60 0.48  CALCIUM 9.7  --  9.6   Liver Function Tests:  Recent Labs Lab 08/31/15 0537  AST 49*  ALT 22  ALKPHOS 60  BILITOT 0.4  PROT 6.8  ALBUMIN 3.7   No results for input(s): LIPASE, AMYLASE in the last 168 hours. No results for input(s): AMMONIA in the last 168 hours. CBC:  Recent Labs Lab 08/30/15 1700 08/31/15 0130 08/31/15 0537  WBC 5.9 3.9* 2.6*  NEUTROABS 4.0  --   --   HGB 11.4* 10.5* 10.5*  HCT 38.4 36.3 35.9*  MCV 76.0* 77.1* 76.7*  PLT 281 259 253   Cardiac Enzymes:  Recent Labs Lab 08/30/15 1700 08/31/15 0130 08/31/15 0537 08/31/15 1115  TROPONINI <0.03 <0.03 <0.03 <0.03   BNP: BNP (last 3 results)  Recent Labs  04/12/15 0853 08/30/15 1700  BNP 24.5 44.8    ProBNP (last 3 results)  Recent Labs  09/14/14 1246  PROBNP 49.2    CBG:  Recent Labs Lab 08/31/15 0701 09/01/15 0654 09/02/15 0555  GLUCAP 136* 121* 114*       Signed:  Marzetta Board  [facility] 09/02/2015, 3:57 PM

## 2015-09-03 ENCOUNTER — Telehealth: Payer: Self-pay | Admitting: Adult Health

## 2015-09-03 ENCOUNTER — Telehealth: Payer: Self-pay | Admitting: Behavioral Health

## 2015-09-03 DIAGNOSIS — J438 Other emphysema: Secondary | ICD-10-CM

## 2015-09-03 NOTE — Telephone Encounter (Signed)
Transition Care Management Follow-up Telephone Call   Date discharged? 09/02/15   How have you been since you were released from the hospital? Patient stated, " Pretty good, I'm doing a lot better than I had before", "I've rested better today than I have in a long time".   Do you understand why you were in the hospital? yes, patient reported that she was seen for COPD.   Do you understand the discharge instructions? yes   Where were you discharged to? Home   Items Reviewed:  Medications reviewed: yes  Allergies reviewed: yes  Dietary changes reviewed: yes, patient verbalized that there were no changes in her diet.  Referrals reviewed: yes, follow-up with her PCP and Pulmonology.   Functional Questionnaire:   Activities of Daily Living (ADLs):   She states they are independent in the following: ambulation, bathing and hygiene, feeding, continence, grooming, toileting and dressing States they require assistance with the following: None   Any transportation issues/concerns?: no   Any patient concerns? yes, patient voiced that she's concerned about frequent urination and a line that appears across her stomach.   Confirmed importance and date/time of follow-up visits scheduled yes, 09/12/15 at 1:15 PM.  Provider Appointment booked with Debbrah Alar, NP.  Confirmed with patient if condition begins to worsen call PCP or go to the ER.  Patient was given the office number and encouraged to call back with question or concerns.  : yes

## 2015-09-03 NOTE — Telephone Encounter (Signed)
Patient returning your call regarding a missed call best # [phone]

## 2015-09-03 NOTE — Telephone Encounter (Signed)
Spoke with pt. She reports she has had her neb machine for about 5 years and wants order placed to get a new one. Order placed. Nothing further needed

## 2015-09-03 NOTE — Telephone Encounter (Signed)
TCM/Hospital Follow-up call completed. Please see telephone note on 09/03/15.

## 2015-09-05 ENCOUNTER — Encounter: Payer: Self-pay | Admitting: *Deleted

## 2015-09-06 ENCOUNTER — Telehealth: Payer: Self-pay | Admitting: *Deleted

## 2015-09-06 NOTE — Telephone Encounter (Signed)
Patient was recently in the hospital and had multiple IVs and lab draws. She has bruises at those sites and wanted to make sure this was okay. Assured patient that bruising at puncture sites was normal, but that if they continued, grew in size or she noticed bruising in other placed to call the office back. She understood.

## 2015-09-07 ENCOUNTER — Ambulatory Visit (INDEPENDENT_AMBULATORY_CARE_PROVIDER_SITE_OTHER): Payer: Medicare Other | Admitting: Family

## 2015-09-07 ENCOUNTER — Encounter: Payer: Self-pay | Admitting: Family

## 2015-09-07 VITALS — BP 110/65 | HR 73 | Temp 98.1°F | Ht 65.0 in | Wt 101.0 lb

## 2015-09-07 DIAGNOSIS — E0590 Thyrotoxicosis, unspecified without thyrotoxic crisis or storm: Secondary | ICD-10-CM

## 2015-09-07 DIAGNOSIS — J441 Chronic obstructive pulmonary disease with (acute) exacerbation: Secondary | ICD-10-CM

## 2015-09-07 DIAGNOSIS — R350 Frequency of micturition: Secondary | ICD-10-CM

## 2015-09-07 MED ORDER — VERAPAMIL HCL ER 240 MG PO TBCR
360.0000 mg | EXTENDED_RELEASE_TABLET | Freq: Every day | ORAL | Status: DC
Start: 2015-09-07 — End: 2016-01-09

## 2015-09-07 NOTE — Progress Notes (Signed)
Subjective:    Patient ID: BRANIGAN HANELINE, female    DOB: 27-Dec-1935, 79 y.o.   MRN: [id number]  HPI  Ms. Mcgrue is a 79 yr old female who presents today for hospital follow up. She was d/c'd on 09/02/15 following a 3 day stay for COPD/Acute on chronic respiratory failure.  Discharge summary is reviewed.  CT chest neg for PE.  Pt was placed on IV steroids and antibiotics and her respiratory status improved.  She was discharged home on levofloxacin for and additional 4 days as well as a prednisone taper.  She has 5 more days left since she got home.  She note progressive improvement in her breathing. She is using prn robitussin as needed for cough.  TSH was noted to be mildly depressed during her admission and she was again noted to have a microcytic anemia with a negative IFOB. + urinary frequency.  Denies dysuria.  She notes + heat intolerance.  Review of Systems See HPI  Past Medical History  Diagnosis Date  . Pneumonia   . COPD (chronic obstructive pulmonary disease) ([place])   . Hypertension   . DVT (deep venous thrombosis) ([place]) 1980s, recurrent 2015    Right DVT 2015  on xarelto  . GERD (gastroesophageal reflux disease)   . Lactose intolerance   . Spinal stenosis   . Meningioma [facility])     followed by Dr Christella Noa    Social History   Social History  . Marital Status: Divorced    Spouse Name: N/A  . Number of Children: 4  . Years of Education: college   Occupational History  . Retired    Social History Main Topics  . Smoking status: Former Smoker -- 1.50 packs/day for 56 years    Types: Cigarettes    Quit date: 04/29/2012  . Smokeless tobacco: Never Used     Comment: quit smoking 3 years ago  . Alcohol Use: No     Comment: quit drinking beer 2005  . Drug Use: No  . Sexual Activity: Not on file   Other Topics Concern  . Not on file   Social History Narrative   Patient lives at home alone- divorced, no pets.     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 Foot surgery    . Cholecystectomy    . Tonsillectomy    . Tubal ligation      Family History  Problem Relation Age of Onset  . Lupus Sister   . COPD Father     smoker deceased.   [name] Heart attack Son     Allergies  Allergen Reactions  . Incruse Ellipta [Umeclidinium Bromide] Shortness Of Breath  . Tramadol Shortness Of Breath  . Amlodipine     Other reaction(s): SWELLING  . Aspirin     Other reaction(s): PALPITATIONS Other reaction(s): DIFFICULTY BREATHING Heart flutter  . Codeine Other (See Comments)    Hallucinations, can take Tylenol #3 now  . Doxycycline     Other reaction(s): NAUSEA,VOMITING  . Hydrocodone     hallucinations  . Losartan     unknown  . Propoxyphene Nausea Only    Other reaction(s):  NAUSEA  . Tiotropium Itching and Rash    Current Outpatient Prescriptions on File Prior to Visit  Medication Sig Dispense Refill  . acetaminophen-codeine (TYLENOL #3) 300-30 MG tablet Take 1-2 tablets by mouth every 8 (eight) hours as needed for moderate pain or severe pain. 60 tablet 0  . albuterol (PROVENTIL HFA;VENTOLIN HFA) 108 (90 BASE) MCG/ACT inhaler Inhale 2 puffs into the lungs every 6 (six) hours as needed for wheezing or shortness of breath. 1 Inhaler 3  . albuterol (PROVENTIL) (2.5 MG/3ML) 0.083% nebulizer solution Take 3 mLs (2.5 mg total) by nebulization every 6 (six) hours as needed for shortness of breath. And as needed 375 mL 2  . budesonide (PULMICORT) 0.25 MG/2ML nebulizer solution Take 2 mLs (0.25 mg total) by nebulization 2 (two) times daily. DX: J43.9 120 mL 12  . cholecalciferol (VITAMIN D) 1000 UNITS tablet Take 1,000 Units by mouth daily.    . ferrous sulfate (SLOW IRON) 160 (50 FE) MG TBCR SR tablet Take 1  tablet by mouth daily.    . Multiple Vitamins-Minerals (MULTIVITAMIN & MINERAL PO) Take 1 tablet by mouth daily.    . potassium chloride SA (K-DUR,KLOR-CON) 20 MEQ tablet Take 1 tablet (20 mEq total) by mouth daily. 30 tablet 5  . predniSONE (DELTASONE) 20 MG tablet Take 2 tablets (40 mg total) by mouth daily with breakfast. 2 tablets daily for 3 days then 1 tablet daily for 3 days then 1/2 tablet daily for 4 days. 11 tablet 0  . ranitidine (ZANTAC) 300 MG tablet Take 1 tablet (300 mg total) by mouth at bedtime. 1 tablet at bedtime 30 tablet 5  . sodium chloride (OCEAN) 0.65 % SOLN nasal spray Place 2 sprays into both nostrils as needed for congestion.    . verapamil (CALAN-SR) 240 MG CR tablet Take 1.5 tablets (360 mg total) by mouth daily. 45 tablet 5  . loratadine (CLARITIN) 10 MG tablet Take 1 tablet (10 mg total) by mouth daily. (Patient not taking: Reported on 09/07/2015) 30 tablet 11   No current facility-administered medications on file prior to visit.    BP 110/65 mmHg  Pulse 73  Temp(Src) 98.1 F (36.7 C) (Oral)  Ht 5\' 5"  (1.651 m)  Wt 101 lb (45.813 kg)  BMI 16.81 kg/m2  SpO2 94%       Objective:   Physical Exam  Constitutional: She is oriented to person, place, and time.  Thin AA female, NAD  Cardiovascular: Normal rate, regular rhythm and normal heart sounds.   No murmur heard. Pulmonary/Chest: Effort normal. No respiratory distress.  Diminished breath sounds throughout, soft right sided expiratory wheeze  Musculoskeletal: She exhibits no edema.  Neurological: She is alert and oriented to person, place, and time.  Skin: Skin is warm and dry.  Psychiatric: She has a normal mood and affect. Her behavior is normal. Judgment and thought content normal.          Assessment & Plan:  Urinary frequency- suspect overactive bladder.  Pt refused to provide urine sample today for UA and culture. We did discuss trial of OAB med such as myrbetriq but she declines.

## 2015-09-07 NOTE — Assessment & Plan Note (Signed)
New. We discussed that this could be contributing to heat intolerance. Plan follow up TFT's in 6 weeks.  If still abnormal, plan referral to endocrinology.

## 2015-09-07 NOTE — Patient Instructions (Addendum)
Complete prednisone taper.  Keep upcoming appointment with Pulmonology.

## 2015-09-07 NOTE — Progress Notes (Signed)
Pre visit review using our clinic review tool, if applicable. No additional management support is needed unless otherwise documented below in the visit note. 

## 2015-09-07 NOTE — Assessment & Plan Note (Signed)
Clinically improved. Continue continuous oxygen by nasal cannula.  Complete pred taper, keep upcoming appointment with pulmonary.

## 2015-09-12 ENCOUNTER — Ambulatory Visit: Payer: Medicare Other | Admitting: Family

## 2015-09-14 ENCOUNTER — Encounter: Payer: Self-pay | Admitting: Internal Medicine

## 2015-09-14 ENCOUNTER — Telehealth: Payer: Self-pay | Admitting: *Deleted

## 2015-09-14 ENCOUNTER — Ambulatory Visit (INDEPENDENT_AMBULATORY_CARE_PROVIDER_SITE_OTHER): Payer: Medicare Other | Admitting: Internal Medicine

## 2015-09-14 VITALS — BP 118/72 | HR 81 | Temp 98.2°F | Ht 65.0 in | Wt 110.2 lb

## 2015-09-14 DIAGNOSIS — Z8679 Personal history of other diseases of the circulatory system: Secondary | ICD-10-CM | POA: Diagnosis not present

## 2015-09-14 DIAGNOSIS — F411 Generalized anxiety disorder: Secondary | ICD-10-CM

## 2015-09-14 NOTE — Progress Notes (Signed)
Subjective:    Patient ID: Holly Page, female    DOB: 02/26/1936, 79 y.o.   MRN: [id number]  DOS:  09/14/2015 Type of visit - description : Acute, patient concerned about a clot @ the left arm Interval history: Patient reports swollen area at the left elbow since she left the hospital earlier this month, the area is not getting worse, is not red or tender to palpation. She has a history of a superficial phlebitis at the  left arm and is concerned. She went to see her hematologist and they dc Xarelto 08/07/2015   Review of Systems  Denies any fever or chills No chest pain, breathing he is at baseline. Occasional palpitations when she exercise which is not a new symptom. No lower extremity swelling or problems.  Past Medical History  Diagnosis Date  . Pneumonia   . COPD (chronic obstructive pulmonary disease) ([place])   . Hypertension   . DVT (deep venous thrombosis) ([place]) 1980s, recurrent 2015    Right DVT 2015  on xarelto  . GERD (gastroesophageal reflux disease)   . Lactose intolerance   . Spinal stenosis   . Meningioma ([place])     followed by Dr Christella Noa    Past Surgical History  Procedure Laterality Date  . Foot surgery    . Cholecystectomy    . Tonsillectomy    . Tubal ligation      Social History   Social History  . Marital Status: Divorced    Spouse Name: N/A  . Number of Children: 4  . Years of Education: college   Occupational History  . Retired    Social History Main Topics  . Smoking status: Former Smoker -- 1.50 packs/day for 56 years    Types: Cigarettes    Quit date: 04/29/2012  . Smokeless tobacco: Never Used     Comment: quit smoking 3 years ago  . Alcohol Use: No     Comment: quit drinking beer 2005  . Drug Use: No  . Sexual Activity: Not on file   Other Topics Concern  . Not on file   Social History Narrative   Patient lives at home alone- divorced, no pets.     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  Always use your most recent med list.               acetaminophen-codeine 300-30 MG tablet  Commonly known as:  TYLENOL #3  Take 1-2 tablets by mouth every 8 (eight) hours as needed for moderate pain or severe pain.     albuterol 108 (90 BASE) MCG/ACT inhaler  Commonly known as:  PROVENTIL HFA;VENTOLIN HFA  Inhale 2 puffs into the lungs every 6 (six) hours as needed for wheezing or shortness of breath.     albuterol (2.5 MG/3ML) 0.083% nebulizer solution  Commonly known as:  PROVENTIL  Take 3 mLs (2.5 mg total) by nebulization every 6 (six) hours as needed for shortness of breath. And as needed     budesonide 0.25 MG/2ML nebulizer solution  Commonly known as:  PULMICORT  Take 2 mLs (0.25 mg total) by nebulization 2 (two) times daily. DX: J43.9     cholecalciferol 1000 UNITS tablet  Commonly known as:  VITAMIN D  Take 1,000 Units by mouth daily.     loratadine 10 MG tablet  Commonly known as:  CLARITIN  Take 1 tablet (10 mg total) by mouth daily.     MULTIVITAMIN & MINERAL PO  Take 1 tablet by mouth daily.     potassium chloride SA 20 MEQ tablet  Commonly known as:  K-DUR,KLOR-CON  Take 1 tablet (20 mEq total) by mouth daily.     ranitidine 300 MG tablet  Commonly known as:  ZANTAC  Take 1 tablet (300 mg total) by mouth at bedtime. 1 tablet at bedtime     SLOW IRON 160 (50 FE) MG Tbcr SR tablet  Generic drug:  ferrous sulfate  Take 1 tablet by mouth daily.     sodium chloride 0.65 % Soln nasal spray  Commonly known as:  OCEAN  Place 2 sprays into both nostrils as needed for congestion.     verapamil 240 MG CR tablet  Commonly known as:  CALAN-SR  Take 1.5 tablets (360 mg total) by mouth daily.             Objective:   Physical Exam  Musculoskeletal:       Arms:  BP 118/72 mmHg  Pulse 81  Temp(Src) 98.2 F (36.8 C) (Oral)  Ht 5\' 5"  (1.651 m)  Wt 110 lb 4 oz (50.009 kg)  BMI 18.35 kg/m2  SpO2 96% General:   Well developed, well nourished . NAD.  HEENT:  Normocephalic . Face symmetric, atraumatic  Extremities: Right arm normal Left arm: Normal to inspection and palpation. No swelling. Superficial veins without evidence of phlebitis. Skin: Not pale. Not jaundice Neurologic:  alert & oriented X3.  Speech normal, gait appropriate for age and unassisted Psych--  Cognition and judgment appear intact.  Cooperative with normal attention span and concentration.  Behavior appropriate. No anxious or depressed appearing.      Assessment & Plan:   History of superficial phlebitis At this point, I don't see evidence of DVT or superficial phlebitis on clinical grounds at any of the upper extremities. I reassured the patient, she is very anxious about this, we talk about possibly doing an ultrasound but eventually agreed on observation. The prominence on the left arm (see physical exam) is likely anatomical. Recommended to watch the area closely. See instructions. Anxiety related to above, counseled

## 2015-09-14 NOTE — Telephone Encounter (Signed)
Patient c/o general aching to her arm. She states after being discharged from the hospital, this arm was bruised and sore from the multiple IVs and lab draws. The bruising decreased but it's still "achy". There is no swelling or warmth. She's worried because she's no longer on blood thinners and she thinks this achy feeling is similar to symptoms she had when she had a DVT in the past.  Spoke to Dr Marin Olp who agrees to a doppler if patient would like to have a workup.   Attempted to call patient back with no answer. Left detailed message on phone to call back. Will also reattempt to call patient on Monday.

## 2015-09-14 NOTE — Progress Notes (Signed)
Pre visit review using our clinic review tool, if applicable. No additional management support is needed unless otherwise documented below in the visit note. 

## 2015-09-14 NOTE — Telephone Encounter (Signed)
Forwarded to Melissa. JG//CMA  

## 2015-09-14 NOTE — Patient Instructions (Signed)
If you develop swelling, redness, warmness at any area in your arms-- please  call or go to the ER

## 2015-09-17 ENCOUNTER — Telehealth: Payer: Self-pay | Admitting: *Deleted

## 2015-09-17 NOTE — Telephone Encounter (Signed)
Spoke to patient as a follow up to her phone call on Friday. She states that her arm feels better and at this time doesn't want to have further workup. She states she will follow up with the office at her already scheduled appointment on January 16th. Patient aware to call the office for any further concerns or questions.

## 2015-09-19 ENCOUNTER — Telehealth: Payer: Self-pay | Admitting: Family

## 2015-09-19 NOTE — Telephone Encounter (Signed)
Caller name: Anderson Malta     Reason for call: [facility] is requesting an order for an additional Home Visit for next week and she will then be d/c'd from home health

## 2015-09-19 NOTE — Telephone Encounter (Signed)
Was supposed to d/c pt today.  Pt just completed prednisone and was started on Vesicare by urology. [facility] wants to make sure pt doesn't have any problems with vesicare or any exacerbation after stopping prednisone. Gave verbal authorization for additional visit next week.

## 2015-09-24 ENCOUNTER — Encounter (HOSPITAL_BASED_OUTPATIENT_CLINIC_OR_DEPARTMENT_OTHER): Payer: Self-pay | Admitting: *Deleted

## 2015-09-24 ENCOUNTER — Emergency Department (HOSPITAL_BASED_OUTPATIENT_CLINIC_OR_DEPARTMENT_OTHER)
Admission: EM | Admit: 2015-09-24 | Discharge: 2015-09-24 | Disposition: A | Discharge status: Home / Self Care | Payer: Medicare Other | Attending: Emergency Medicine | Admitting: Emergency Medicine

## 2015-09-24 ENCOUNTER — Emergency Department (HOSPITAL_BASED_OUTPATIENT_CLINIC_OR_DEPARTMENT_OTHER): Payer: Medicare Other

## 2015-09-24 DIAGNOSIS — K219 Gastro-esophageal reflux disease without esophagitis: Secondary | ICD-10-CM | POA: Diagnosis not present

## 2015-09-24 DIAGNOSIS — Y998 Other external cause status: Secondary | ICD-10-CM | POA: Insufficient documentation

## 2015-09-24 DIAGNOSIS — Y9289 Other specified places as the place of occurrence of the external cause: Secondary | ICD-10-CM | POA: Diagnosis not present

## 2015-09-24 DIAGNOSIS — M7989 Other specified soft tissue disorders: Secondary | ICD-10-CM

## 2015-09-24 DIAGNOSIS — X58XXXA Exposure to other specified factors, initial encounter: Secondary | ICD-10-CM | POA: Diagnosis not present

## 2015-09-24 DIAGNOSIS — I10 Essential (primary) hypertension: Secondary | ICD-10-CM | POA: Insufficient documentation

## 2015-09-24 DIAGNOSIS — Z9981 Dependence on supplemental oxygen: Secondary | ICD-10-CM | POA: Diagnosis not present

## 2015-09-24 DIAGNOSIS — Z8701 Personal history of pneumonia (recurrent): Secondary | ICD-10-CM | POA: Diagnosis not present

## 2015-09-24 DIAGNOSIS — J449 Chronic obstructive pulmonary disease, unspecified: Secondary | ICD-10-CM | POA: Diagnosis not present

## 2015-09-24 DIAGNOSIS — S5011XA Contusion of right forearm, initial encounter: Secondary | ICD-10-CM

## 2015-09-24 DIAGNOSIS — Z86718 Personal history of other venous thrombosis and embolism: Secondary | ICD-10-CM | POA: Diagnosis not present

## 2015-09-24 DIAGNOSIS — Z79899 Other long term (current) drug therapy: Secondary | ICD-10-CM | POA: Diagnosis not present

## 2015-09-24 DIAGNOSIS — Z859 Personal history of malignant neoplasm, unspecified: Secondary | ICD-10-CM | POA: Insufficient documentation

## 2015-09-24 DIAGNOSIS — Z87891 Personal history of nicotine dependence: Secondary | ICD-10-CM | POA: Insufficient documentation

## 2015-09-24 DIAGNOSIS — Y9389 Activity, other specified: Secondary | ICD-10-CM | POA: Diagnosis not present

## 2015-09-24 LAB — CBC WITH DIFFERENTIAL/PLATELET
BASOS ABS: 0 10*3/uL (ref 0.0–0.1)
Basophils Relative: 0 %
Eosinophils Absolute: 0.1 10*3/uL (ref 0.0–0.7)
Eosinophils Relative: 2 %
HEMATOCRIT: 36.5 % (ref 36.0–46.0)
HEMOGLOBIN: 11 g/dL — AB (ref 12.0–15.0)
LYMPHS PCT: 38 %
Lymphs Abs: 2.1 10*3/uL (ref 0.7–4.0)
MCH: 22.5 pg — ABNORMAL LOW (ref 26.0–34.0)
MCHC: 30.1 g/dL (ref 30.0–36.0)
MCV: 74.6 fL — ABNORMAL LOW (ref 78.0–100.0)
MONOS PCT: 8 %
Monocytes Absolute: 0.4 10*3/uL (ref 0.1–1.0)
NEUTROS PCT: 52 %
Neutro Abs: 2.8 10*3/uL (ref 1.7–7.7)
Platelets: 262 10*3/uL (ref 150–400)
RBC: 4.89 MIL/uL (ref 3.87–5.11)
RDW: 16.7 % — AB (ref 11.5–15.5)
WBC: 5.4 10*3/uL (ref 4.0–10.5)

## 2015-09-24 LAB — PROTIME-INR
INR: 1.06 (ref 0.00–1.49)
PROTHROMBIN TIME: 14 s (ref 11.6–15.2)

## 2015-09-24 LAB — APTT: aPTT: 27 seconds (ref 24–37)

## 2015-09-24 NOTE — Discharge Instructions (Signed)

## 2015-09-24 NOTE — ED Provider Notes (Signed)
CSN: [id number]     Arrival date & time 09/24/15  1225 History   First MD Initiated Contact with Patient 09/24/15 1446     Chief Complaint  Patient presents with  . Arm Swelling     (Consider location/radiation/quality/duration/timing/severity/associated sxs/prior Treatment) Patient is a 79 y.o. female presenting with extremity pain. The history is provided by the patient.  Extremity Pain This is a new problem. The current episode started yesterday. The problem occurs constantly. The problem has not changed since onset.Pertinent negatives include no chest pain, no abdominal pain and no shortness of breath (no change in home O2 requirement). Associated symptoms comments: Noticed bump on forearm that has extended slightly on dorsal surface. Nothing aggravates the symptoms. Nothing relieves the symptoms. She has tried nothing for the symptoms. The treatment provided no relief.    Past Medical History  Diagnosis Date  . Pneumonia   . COPD (chronic obstructive pulmonary disease) ([place])   . Hypertension   . DVT (deep venous thrombosis) ([place]) 1980s, recurrent 2015    Right DVT 2015  on xarelto  . GERD (gastroesophageal reflux disease)   . Lactose intolerance   . Spinal stenosis   . Meningioma ([place])     followed by Dr Christella Noa   Past Surgical History  Procedure Laterality Date  . Foot surgery    . Cholecystectomy    . Tonsillectomy    . Tubal ligation     Family History  Problem Relation Age of Onset  . Lupus Sister   . COPD Father     smoker deceased.   [name] Heart attack Son    Social History  Substance Use Topics  . Smoking status: Former Smoker -- 1.50 packs/day for 56 years    Types: Cigarettes    Quit date: 04/29/2012  . Smokeless tobacco: Never Used     Comment: quit smoking 3 years ago  . Alcohol Use: No     Comment: quit drinking beer 2005   OB History    No data available     Review of Systems  Respiratory: Negative for shortness of breath (no change in home O2  requirement).   Cardiovascular: Negative for chest pain.  Gastrointestinal: Negative for abdominal pain.  All other systems reviewed and are negative.     Allergies  Incruse ellipta; Tramadol; Amlodipine; Aspirin; Codeine; Doxycycline; Hydrocodone; Losartan; Propoxyphene; and Tiotropium  Home Medications   Prior to Admission medications   Medication Sig Start Date End Date Taking? Authorizing Provider  OXYGEN Inhale 3 L/hr into the lungs.   Yes Historical Provider, MD  acetaminophen-codeine (TYLENOL #3) 300-30 MG tablet Take 1-2 tablets by mouth every 8 (eight) hours as needed for moderate pain or severe pain. 07/16/15   Debbrah Alar, NP  albuterol (PROVENTIL HFA;VENTOLIN HFA) 108 (90 BASE) MCG/ACT inhaler Inhale 2 puffs into the lungs every 6 (six) hours as needed for wheezing or shortness of breath. 01/25/15   Elsie Stain, MD  albuterol (PROVENTIL) (2.5 MG/3ML) 0.083% nebulizer solution Take 3 mLs (2.5 mg total) by nebulization every 6 (six) hours as needed for shortness of breath. And as needed 05/18/15   Tammy S Parrett, NP  budesonide (PULMICORT) 0.25 MG/2ML nebulizer solution Take 2 mLs (0.25 mg total) by nebulization 2 (two) times daily. DX: J43.9 02/12/15   Elsie Stain, MD  cholecalciferol (VITAMIN D) 1000 UNITS tablet Take 1,000 Units by mouth daily.    Historical Provider, MD  ferrous sulfate (SLOW IRON) 160 (50 FE) MG TBCR  SR tablet Take 1 tablet by mouth daily.    Historical Provider, MD  loratadine (CLARITIN) 10 MG tablet Take 1 tablet (10 mg total) by mouth daily. Patient not taking: Reported on 09/07/2015 08/09/15   Debbrah Alar, NP  Multiple Vitamins-Minerals (MULTIVITAMIN & MINERAL PO) Take 1 tablet by mouth daily.    Historical Provider, MD  potassium chloride SA (K-DUR,KLOR-CON) 20 MEQ tablet Take 1 tablet (20 mEq total) by mouth daily. 06/20/15   Debbrah Alar, NP  ranitidine (ZANTAC) 300 MG tablet Take 1 tablet (300 mg total) by mouth at bedtime. 1  tablet at bedtime 04/24/15 04/23/16  Debbrah Alar, NP  sodium chloride (OCEAN) 0.65 % SOLN nasal spray Place 2 sprays into both nostrils as needed for congestion.    Historical Provider, MD  verapamil (CALAN-SR) 240 MG CR tablet Take 1.5 tablets (360 mg total) by mouth daily. 09/07/15   Debbrah Alar, NP   BP 152/77 mmHg  Pulse 85  Temp(Src) 98.3 F (36.8 C) (Oral)  Resp 22  Ht 5\' 5"  (1.651 m)  Wt 100 lb (45.36 kg)  BMI 16.64 kg/m2  SpO2 90% Physical Exam  Constitutional: She is oriented to person, place, and time. She appears well-developed and well-nourished. No distress.  HENT:  Head: Normocephalic.  Eyes: Conjunctivae are normal.  Neck: Neck supple. No tracheal deviation present.  Cardiovascular: Normal rate, regular rhythm and normal heart sounds.   Pulmonary/Chest: Effort normal. No respiratory distress. She has no wheezes. She has rales (diffuse bilateral, non-focal). She exhibits no tenderness.  Abdominal: Soft. She exhibits no distension. There is no tenderness.  Musculoskeletal:       Right forearm: She exhibits tenderness (over proximal forearm muscle, no palpable cord). She exhibits no edema and no deformity.       Arms: Neurological: She is alert and oriented to person, place, and time.  Skin: Skin is warm and dry.  Psychiatric: She has a normal mood and affect.  Vitals reviewed.   ED Course  Procedures (including critical care time) Labs Review Labs Reviewed  CBC WITH DIFFERENTIAL/PLATELET - Abnormal; Notable for the following:    Hemoglobin 11.0 (*)    MCV 74.6 (*)    MCH 22.5 (*)    RDW 16.7 (*)    All other components within normal limits  APTT  PROTIME-INR    Imaging Review US Venous Img Upper Uni Right  09/24/2015  CLINICAL DATA:  79 year old female with a 1 day history of a painful lump in the posterior aspect of the right forearm EXAM: RIGHT UPPER EXTREMITY VENOUS DOPPLER ULTRASOUND TECHNIQUE: Gray-scale sonography with graded compression,  as well as color Doppler and duplex ultrasound were performed to evaluate the upper extremity deep venous system from the level of the subclavian vein and including the jugular, axillary, basilic, radial, ulnar and upper cephalic vein. Spectral Doppler was utilized to evaluate flow at rest and with distal augmentation maneuvers. COMPARISON:  Prior upper extremity DVT ultrasound evaluation 09/04/2014 FINDINGS: Contralateral Subclavian Vein: Respiratory phasicity is normal and symmetric with the symptomatic side. No evidence of thrombus. Normal compressibility. Internal Jugular Vein: No evidence of thrombus. Normal compressibility, respiratory phasicity and response to augmentation. Subclavian Vein: No evidence of thrombus. Normal compressibility, respiratory phasicity and response to augmentation. Axillary Vein: No evidence of thrombus. Normal compressibility, respiratory phasicity and response to augmentation. Cephalic Vein: No evidence of thrombus. Normal compressibility, respiratory phasicity and response to augmentation. Basilic Vein: No evidence of thrombus. Normal compressibility, respiratory phasicity and response to augmentation.  Brachial Veins: No evidence of thrombus. Normal compressibility, respiratory phasicity and response to augmentation. Radial Veins: No evidence of thrombus. Normal compressibility, respiratory phasicity and response to augmentation. Ulnar Veins: No evidence of thrombus. Normal compressibility, respiratory phasicity and response to augmentation. Venous Reflux:  None visualized. Other Findings: Nonspecific amorphous region of relative hypo echogenicity in the region of clinical concern. The abnormality measures 9 x 13 x 2 mm. There is no internal color flow. Findings are most consistent with a small pocket of fluid in the subcutaneous superficial fat. IMPRESSION: No evidence of deep venous thrombosis. Small superficial fluid collection in the subcutaneous fat of the region of clinical  concern. Differential considerations are broad and include a small hematoma, versus focal edema secondary to either an infectious or inflammatory process. Given the relatively flat and amorphous shape, abscess is considered less likely. Electronically Signed   By: Jacqulynn Cadet M.D.   On: 09/24/2015 16:06   I have personally reviewed and evaluated these images and lab results as part of my medical decision-making.   EKG Interpretation None      MDM   Final diagnoses:  Forearm contusion, right, initial encounter    79 year old female presents with right arm discomfort and what she describes is swelling over her proximal right forearm just next to her elbow. She has focal tenderness over her right brachioradialis muscle and no soft tissue edema proximal or distal to this area. I have very low suspicion for acute DVT in her right arm but the patient is highly concerned and she does have a history of prior lower extremity DVT requiring treatment on Xarelto when she is not currently anticoagulated. Ultrasound of the affected extremity was ordered for definitive rule out.  Leo Grosser, MD 09/24/15 [phone]

## 2015-09-24 NOTE — ED Notes (Addendum)
Pt c/o right arm swelling/ pain x 3 days. Denies injury. HX DVT  Recently taken of xeralto

## 2015-09-24 NOTE — ED Notes (Signed)
Patient transported to Ultrasound 

## 2015-10-04 ENCOUNTER — Encounter: Payer: Self-pay | Admitting: Adult Health

## 2015-10-04 ENCOUNTER — Ambulatory Visit (INDEPENDENT_AMBULATORY_CARE_PROVIDER_SITE_OTHER): Payer: Medicare Other | Admitting: Adult Health

## 2015-10-04 VITALS — BP 118/70 | HR 74 | Temp 97.6°F | Ht 65.0 in | Wt 110.0 lb

## 2015-10-04 DIAGNOSIS — J9611 Chronic respiratory failure with hypoxia: Secondary | ICD-10-CM

## 2015-10-04 DIAGNOSIS — J439 Emphysema, unspecified: Secondary | ICD-10-CM | POA: Diagnosis not present

## 2015-10-04 MED ORDER — PREDNISONE 10 MG PO TABS: ORAL_TABLET | ORAL | Status: DC

## 2015-10-04 NOTE — Progress Notes (Signed)
Subjective:    Patient ID: Holly Page, female    DOB: 05/09/1936, 80 y.o.   MRN: [id number]  HPI    Review of Systems     Objective:   Physical Exam        Assessment & Plan:       Subjective:    Patient ID: Holly Page, female    DOB: [birth year], 80 y.o.   MRN: [id number]  HPI  80 y.o. F with Gold D Copd primary emphysema on O2 >GOLD card patient  Hx of DVT -05/2014 (tx w/ xarelto until 12/2014 ) -followed by hematology  Left arm superficial venous thrombus , Xarelto 01/25/15 >followed by Hematology >stopped 07/2015  CT chest, on March 19 2015 >that showed no evidence of pulmonary embolism  and stable. Severe COPD changes. Last spirometry showed FEV1 at 30% in 2015.  She has been tried on [place] but was unable to tolerate. Oxygen does drop with pulsing O2 on 2 L was able to keep above 90% on pulsing 3 L. Patient says she has been evaluated by cardiology with a negative stress test recently.    10/04/2015 Follow up : COPD /O2 dependent  Patient returns for a 2 month follow-up for severe COPD   patient   is on budesonide nebulizer twice daily and albuterol nebulizer 4 times daily.  She was admitted 1 month ago for COPD flare .CT chest neg for PE. tx w/ pred and abx.  Says she is some better but gets winded very easily with minimal activity . She complains of cough has increased. Feels her breathing is getting worse over last year.  She has tried several inhalers and nebs in past., has several intolerances.  No fever or discolored mucus.   Patient denies any hemoptysis, orthopnea, PND or chest pain. Prevnar/PVX are utd.    Review of Systems  Constitutional:   No  weight loss, night sweats,  Fevers, chills, + fatigue, or  lassitude.  HEENT:   No headaches,  Difficulty swallowing,  Tooth/dental problems, or  Sore throat,                No sneezing, itching, ear ache, nasal congestion, post nasal drip,   CV:  No chest pain,   Orthopnea, PND, swelling in lower extremities, anasarca, dizziness, palpitations, syncope.   GI  Notes heartburn, no indigestion, abdominal pain, nausea, vomiting, diarrhea, change in bowel habits, loss of appetite, bloody stools.   Resp:  .    No chest wall deformity  Skin: no rash or lesions.  GU: no dysuria, change in color of urine, no urgency or frequency.  No flank pain, no hematuria   MS:  No joint pain or swelling.  No decreased range of motion.  No back pain.  Psych:  No change in mood or affect.+anxiety.  No memory loss.      Objective:   Physical Exam Filed Vitals:   10/04/15 0930  Pulse: 74  Temp: 97.6 F (36.4 C)  TempSrc: Oral  Height: 5\' 5"  (1.651 m)  Weight: 110 lb (49.896 kg)  SpO2: 97%    GEN: A/Ox3; pleasant , NAD, elderly and frail , on O2   HEENT:  [place]/AT,  EACs-clear, TMs-wnl, NOSE-clear, THROAT-clear, no lesions  NECK:  Supple w/ fair ROM; no JVD; normal carotid impulses w/o bruits; no thyromegaly or nodules palpated; no lymphadenopathy.  RESP  Diminished in bases w/ no wheezing or rhonchi.no accessory muscle use, no dullness  to percussion  CARD:  RRR, no m/r/g  , no peripheral edema, pulses intact, no cyanosis or clubbing.  GI:   Soft & nt; nml bowel sounds; no organomegaly or masses detected.  Musco: Warm bil, no deformities or joint swelling noted.   Neuro: alert, no focal deficits noted.    Skin: Warm, no lesions or rashes .       Assessment & Plan:

## 2015-10-04 NOTE — Patient Instructions (Signed)
Prednisone taper over next week.   Continue on Albuterol Neb Four times a day  .  Continue on Pulmicort Neb Twice daily  .  Continue on Oxygen 3l/m with activity and 2l/m at rest .  Follow up 6 -8 weeks with Dr Elsworth Soho and As needed   Please contact office for sooner follow up if symptoms do not improve or worsen or seek emergency care

## 2015-10-04 NOTE — Assessment & Plan Note (Signed)
Recurrent exacerbations , if improves on steroids may want to consider restarting low dose prednisone.   Plan  Prednisone taper over next week.   Continue on Albuterol Neb Four times a day  .  Continue on Pulmicort Neb Twice daily  .  Continue on Oxygen 3l/m with activity and 2l/m at rest .  Follow up 6 -8 weeks with Dr Elsworth Soho and As needed   Please contact office for sooner follow up if symptoms do not improve or worsen or seek emergency care

## 2015-10-04 NOTE — Assessment & Plan Note (Signed)
Compensated on o2   Plan   Continue on Oxygen 3l/m with activity and 2l/m at rest .  Follow up 6 -8 weeks with Dr Elsworth Soho and As needed   Please contact office for sooner follow up if symptoms do not improve or worsen or seek emergency care

## 2015-10-07 NOTE — Progress Notes (Signed)
Reviewed & agree with plan  

## 2015-10-15 ENCOUNTER — Ambulatory Visit: Payer: Medicare Other | Admitting: Hematology & Oncology

## 2015-10-15 ENCOUNTER — Other Ambulatory Visit: Payer: Medicare Other

## 2015-10-15 ENCOUNTER — Ambulatory Visit (HOSPITAL_BASED_OUTPATIENT_CLINIC_OR_DEPARTMENT_OTHER)
Admission: RE | Admit: 2015-10-15 | Discharge: 2015-10-15 | Disposition: A | Discharge status: Home / Self Care | Payer: Medicare Other | Source: Ambulatory Visit | Attending: Hematology & Oncology | Admitting: Hematology & Oncology

## 2015-10-15 DIAGNOSIS — D500 Iron deficiency anemia secondary to blood loss (chronic): Secondary | ICD-10-CM | POA: Diagnosis not present

## 2015-10-15 DIAGNOSIS — I809 Phlebitis and thrombophlebitis of unspecified site: Secondary | ICD-10-CM | POA: Insufficient documentation

## 2015-10-16 ENCOUNTER — Telehealth: Payer: Self-pay | Admitting: *Deleted

## 2015-10-16 ENCOUNTER — Ambulatory Visit: Payer: Medicare Other | Admitting: Family

## 2015-10-16 NOTE — Telephone Encounter (Signed)
-----   Message from Volanda Napoleon, MD sent at 10/16/2015  6:46 AM EST ----- Call - NO blood clot in the arm!!  pete

## 2015-10-19 ENCOUNTER — Ambulatory Visit: Payer: Medicare Other | Admitting: Family

## 2015-10-22 ENCOUNTER — Telehealth: Payer: Self-pay | Admitting: Adult Health

## 2015-10-22 MED ORDER — PREDNISONE 10 MG PO TABS: ORAL_TABLET | ORAL | Status: DC

## 2015-10-22 NOTE — Telephone Encounter (Signed)
Called spoke with pt. Aware of recs. RX sent in. Nothing further needed 

## 2015-10-22 NOTE — Telephone Encounter (Signed)
Pt saw TP 10/04/15. Pt c/o having increase chest tx, SOB w/ exertion, , scratchy throat, very little wheezing x 1-2 days. She wants something called in. Please advise TP thanks  Allergies  Allergen Reactions  . Incruse Ellipta [Umeclidinium Bromide] Shortness Of Breath  . Tramadol Shortness Of Breath  . Amlodipine     Other reaction(s): SWELLING  . Aspirin     Other reaction(s): PALPITATIONS Other reaction(s): DIFFICULTY BREATHING Heart flutter  . Codeine Other (See Comments)    Hallucinations, can take Tylenol #3 now  . Doxycycline     Other reaction(s): NAUSEA,VOMITING  . Hydrocodone     hallucinations  . Losartan     unknown  . Propoxyphene Nausea Only    Other reaction(s): NAUSEA  . Tiotropium Itching and Rash

## 2015-10-22 NOTE — Telephone Encounter (Signed)
Prednisone taper over next week  pred 10mg  4 tabs for 2 days, then 3 tabs for 2 days, 2 tabs for 2 days, then 1 tab for 2 days, then stop  #20 no refills   Please contact office for sooner follow up if symptoms do not improve or worsen or seek emergency care  Ov if not better or worse  Or ER if worse

## 2015-10-31 ENCOUNTER — Telehealth: Payer: Self-pay | Admitting: *Deleted

## 2015-10-31 NOTE — Telephone Encounter (Signed)
Received Physician's Orders from [place]; forwarded to provider/SLS 02/01

## 2015-11-02 ENCOUNTER — Other Ambulatory Visit (HOSPITAL_BASED_OUTPATIENT_CLINIC_OR_DEPARTMENT_OTHER): Payer: Medicare Other

## 2015-11-02 ENCOUNTER — Encounter: Payer: Self-pay | Admitting: Hematology & Oncology

## 2015-11-02 ENCOUNTER — Ambulatory Visit (HOSPITAL_BASED_OUTPATIENT_CLINIC_OR_DEPARTMENT_OTHER): Payer: Medicare Other | Admitting: Hematology & Oncology

## 2015-11-02 VITALS — BP 106/67 | HR 73 | Temp 97.8°F | Resp 20 | Ht 65.0 in | Wt 108.0 lb

## 2015-11-02 DIAGNOSIS — Z86718 Personal history of other venous thrombosis and embolism: Secondary | ICD-10-CM | POA: Diagnosis not present

## 2015-11-02 DIAGNOSIS — I809 Phlebitis and thrombophlebitis of unspecified site: Secondary | ICD-10-CM

## 2015-11-02 DIAGNOSIS — D509 Iron deficiency anemia, unspecified: Secondary | ICD-10-CM | POA: Diagnosis not present

## 2015-11-02 DIAGNOSIS — D500 Iron deficiency anemia secondary to blood loss (chronic): Secondary | ICD-10-CM

## 2015-11-02 LAB — CBC WITH DIFFERENTIAL (CANCER CENTER ONLY)
BASO#: 0 10*3/uL (ref 0.0–0.2)
BASO%: 0.5 % (ref 0.0–2.0)
EOS ABS: 0.2 10*3/uL (ref 0.0–0.5)
EOS%: 4 % (ref 0.0–7.0)
HCT: 38.5 % (ref 34.8–46.6)
HGB: 11.6 g/dL (ref 11.6–15.9)
LYMPH#: 2.5 10*3/uL (ref 0.9–3.3)
LYMPH%: 40.5 % (ref 14.0–48.0)
MCH: 23 pg — AB (ref 26.0–34.0)
MCHC: 30.1 g/dL — ABNORMAL LOW (ref 32.0–36.0)
MCV: 76 fL — AB (ref 81–101)
MONO#: 0.7 10*3/uL (ref 0.1–0.9)
MONO%: 11.1 % (ref 0.0–13.0)
NEUT#: 2.7 10*3/uL (ref 1.5–6.5)
NEUT%: 43.9 % (ref 39.6–80.0)
PLATELETS: 336 10*3/uL (ref 145–400)
RBC: 5.05 10*6/uL (ref 3.70–5.32)
RDW: 16.1 % — AB (ref 11.1–15.7)
WBC: 6.1 10*3/uL (ref 3.9–10.0)

## 2015-11-02 LAB — FERRITIN: Ferritin: 187 ng/ml (ref 9–269)

## 2015-11-02 LAB — CMP (CANCER CENTER ONLY)
ALK PHOS: 67 U/L (ref 26–84)
ALT: 27 U/L (ref 10–47)
AST: 53 U/L — AB (ref 11–38)
Albumin: 3.6 g/dL (ref 3.3–5.5)
BILIRUBIN TOTAL: 0.6 mg/dL (ref 0.20–1.60)
BUN, Bld: 12 mg/dL (ref 7–22)
CALCIUM: 10.2 mg/dL (ref 8.0–10.3)
CO2: 35 mEq/L — ABNORMAL HIGH (ref 18–33)
CREATININE: 0.6 mg/dL (ref 0.6–1.2)
Chloride: 99 mEq/L (ref 98–108)
GLUCOSE: 77 mg/dL (ref 73–118)
Potassium: 3.5 mEq/L (ref 3.3–4.7)
Sodium: 145 mEq/L (ref 128–145)
Total Protein: 7.3 g/dL (ref 6.4–8.1)

## 2015-11-02 LAB — IRON AND TIBC
%SAT: 26 % (ref 21–57)
Iron: 87 ug/dL (ref 41–142)
TIBC: 329 ug/dL (ref 236–444)
UIBC: 242 ug/dL (ref 120–384)

## 2015-11-02 NOTE — Progress Notes (Signed)
Hematology and Oncology Follow Up Visit  Holly Page [id number] 1936-05-09 80 y.o. 11/02/2015   Principle Diagnosis:  Thromboembolic disease of the right thigh Superficial thrombophlebitis of the left arm  Current Therapy:   Observation  Interim History:  [name] is here today for follow-up. [name] She was hospitalized in December for respiratory insufficiency. She has very bad underline COPD.  We did go ahead and do Dopplers of her arms in January. There is no evidence of residual thrombus.  She stopped the Xarelto back in November.  She's had no bleeding. She did have an episode of hematuria but this is resolved.  She's had no nausea or vomiting.  She's had no leg swelling. There's been no arm swelling.  Overall, her performance status is ECOG 2. .   Medications:    Medication List       This list is accurate as of: 11/02/15 10:52 AM.  Always use your most recent med list.               acetaminophen-codeine 300-30 MG tablet  Commonly known as:  TYLENOL #3  Take 1-2 tablets by mouth every 8 (eight) hours as needed for moderate pain or severe pain.     albuterol 108 (90 Base) MCG/ACT inhaler  Commonly known as:  PROVENTIL HFA;VENTOLIN HFA  Inhale 2 puffs into the lungs every 6 (six) hours as needed for wheezing or shortness of breath.     albuterol (2.5 MG/3ML) 0.083% nebulizer solution  Commonly known as:  PROVENTIL  Take 3 mLs (2.5 mg total) by nebulization every 6 (six) hours as needed for shortness of breath. And as needed     budesonide 0.25 MG/2ML nebulizer solution  Commonly known as:  PULMICORT  Take 2 mLs (0.25 mg total) by nebulization 2 (two) times daily. DX: J43.9     cholecalciferol 1000 units tablet  Commonly known as:  VITAMIN D  Take 1,000 Units by mouth daily.     loratadine 10 MG tablet  Commonly known as:  CLARITIN  Take 1 tablet (10 mg total) by mouth daily.     MULTIVITAMIN & MINERAL PO  Take 1 tablet by mouth daily.     OXYGEN  Inhale 3 L/hr into the lungs.     potassium chloride SA 20 MEQ tablet  Commonly known as:  K-DUR,KLOR-CON  Take 1 tablet (20 mEq total) by mouth daily.     predniSONE 10 MG tablet  Commonly known as:  DELTASONE  4 tabs for 2 days, then 3 tabs for 2 days, 2 tabs for 2 days, then 1 tab for 2 days, then stop     predniSONE 10 MG tablet  Commonly known as:  DELTASONE  Take 4 tabs for 2 days, then 3 tabs for 2 days, 2 tabs for 2 days, then 1 tab for 2 days, then stop     ranitidine 300 MG tablet  Commonly known as:  ZANTAC  Take 1 tablet (300 mg total) by mouth at bedtime. 1 tablet at bedtime     SLOW IRON 160 (50 Fe) MG Tbcr SR tablet  Generic drug:  ferrous sulfate  Take 1 tablet by mouth daily.     sodium chloride 0.65 % Soln nasal spray  Commonly known as:  OCEAN  Place 2 sprays into both nostrils as needed for congestion. Reported on 10/04/2015     verapamil 240 MG CR tablet  Commonly known as:  CALAN-SR  Take 1.5 tablets (360 mg total)  by mouth daily.        Allergies:  Allergies  Allergen Reactions  . Incruse Ellipta [Umeclidinium Bromide] Shortness Of Breath  . Tramadol Shortness Of Breath  . Amlodipine     Other reaction(s): SWELLING  . Aspirin     Other reaction(s): PALPITATIONS Other reaction(s): DIFFICULTY BREATHING Heart flutter  . Codeine Other (See Comments)    Hallucinations, can take Tylenol #3 now  . Doxycycline     Other reaction(s): NAUSEA,VOMITING  . Hydrocodone     hallucinations  . Losartan     unknown  . Propoxyphene Nausea Only    Other reaction(s): NAUSEA  . Tiotropium Itching and Rash    Past Medical History, Surgical history, Social history, and Family History were reviewed and updated.  Review of Systems: All other 10 point review of systems is negative.   Physical Exam:  height is 5\' 5"  (1.651 m) and weight is 108 lb (48.988 kg). Her oral temperature is 97.8 F (36.6 C). Her blood pressure is 106/67 and her pulse is 73.  Her respiration is 20.   Wt Readings from Last 3 Encounters:  11/02/15 108 lb (48.988 kg)  10/04/15 110 lb (49.896 kg)  09/24/15 100 lb (45.36 kg)    Well-developed and well-nourished [place] American female. Her head and neck exam shows no ocular or oral lesions. She has no palpable cervical or supraclavicular lymph nodes. Lungs are clear. Cardiac exam regular rate and rhythm with no murmurs, rubs or bruits. Abdomen is soft. She has good bowel sounds. There is no fluid wave. There is no palpable liver or spleen tip. Back exam shows no tenderness over the spine, ribs or hips. Extremities shows no swelling of the left arm. There is no venous cord noticed in the left upper arm. There is no palpable venous cords in the legs. She has good range of motion of her joints. She may have some slight nonpitting edema of the lower legs. Skin exam shows no rashes, ecchymoses or petechia. Lab Results  Component Value Date   WBC 6.1 11/02/2015   HGB 11.6 11/02/2015   HCT 38.5 11/02/2015   MCV 76* 11/02/2015   PLT 336 11/02/2015   Lab Results  Component Value Date   FERRITIN 209.1 12/19/2014   IRON 66 12/21/2014   TIBC 342 12/21/2014   UIBC 276 12/21/2014   IRONPCTSAT 19* 12/21/2014   Lab Results  Component Value Date   RBC 5.05 11/02/2015   No results found for: KPAFRELGTCHN, LAMBDASER, KAPLAMBRATIO No results found for: Kandis Cocking, IGMSERUM No results found for: Odetta Pink, SPEI   Chemistry      Component Value Date/Time   NA 144 08/31/2015 0537   NA 145 08/07/2015 0850   K 4.2 08/31/2015 0537   K 3.4* 08/07/2015 0850   CL 97* 08/31/2015 0537   CO2 39* 08/31/2015 0537   CO2 36* 08/07/2015 0850   BUN <5* 08/31/2015 0537   BUN 7.6 08/07/2015 0850   CREATININE 0.48 08/31/2015 0537   CREATININE 0.7 08/07/2015 0850   CREATININE 0.60 01/25/2015 1010      Component Value Date/Time   CALCIUM 9.6 08/31/2015 0537   CALCIUM 9.8  08/07/2015 0850   ALKPHOS 60 08/31/2015 0537   ALKPHOS 74 08/07/2015 0850   AST 49* 08/31/2015 0537   AST 55* 08/07/2015 0850   ALT 22 08/31/2015 0537   ALT 22 08/07/2015 0850   BILITOT 0.4 08/31/2015 0537   BILITOT 0.35  08/07/2015 0850     Impression and Plan: [name] is 80 year old African-American female. She has a remote history of thrombus in the right thigh. This is back in the 80s.  Then she had a superficial thrombus in the left arm back in April.  I'm thankful that the recent Doppler does not show any issues with her arms. She still has a lot of concern about this.  As always, her lungs will continue to be her primary determinant of survival. She is on supplemental oxygen.  We will continue to follow her along. I do not see that she has been on any anticoagulation at this point.  We'll plan to get her back now in 4 months.  If, she does have another thromboembolic event, then we will need to keep a lifelong anticoagulation.Volanda Napoleon, MD 2/3/201710:52 AM

## 2015-11-05 NOTE — Telephone Encounter (Signed)
Completed faxed 10/31/15/SLS 02/06

## 2015-11-08 ENCOUNTER — Ambulatory Visit (INDEPENDENT_AMBULATORY_CARE_PROVIDER_SITE_OTHER): Payer: Medicare Other | Admitting: Adult Health

## 2015-11-08 ENCOUNTER — Encounter: Payer: Self-pay | Admitting: Adult Health

## 2015-11-08 VITALS — BP 124/82 | HR 87 | Temp 98.0°F | Ht 65.0 in | Wt 107.0 lb

## 2015-11-08 DIAGNOSIS — J9611 Chronic respiratory failure with hypoxia: Secondary | ICD-10-CM | POA: Diagnosis not present

## 2015-11-08 DIAGNOSIS — J439 Emphysema, unspecified: Secondary | ICD-10-CM

## 2015-11-08 DIAGNOSIS — F419 Anxiety disorder, unspecified: Secondary | ICD-10-CM | POA: Diagnosis not present

## 2015-11-08 MED ORDER — PREDNISONE 10 MG PO TABS: ORAL_TABLET | ORAL | Status: DC

## 2015-11-08 MED ORDER — ALPRAZOLAM 0.25 MG PO TABS: ORAL_TABLET | ORAL | Status: DC

## 2015-11-08 NOTE — Progress Notes (Signed)
Subjective:    Patient ID: Holly Page, female    DOB: 1936-06-15, 80 y.o.   MRN: [id number]  HPI  80 y.o. F with Gold D Copd primary emphysema on O2 >GOLD card patient  Hx of DVT -05/2014 (tx w/ xarelto until 12/2014 ) -followed by hematology  Left arm superficial venous thrombus , Xarelto 01/25/15 >followed by Hematology >stopped 07/2015  CT chest, on March 19 2015 >that showed no evidence of pulmonary embolism  and stable. Severe COPD changes. Last spirometry showed FEV1 at 30% in 2015.  She has been tried on [place] but was unable to tolerate. Oxygen does drop with pulsing O2 on 2 L was able to keep above 90% on pulsing 3 L. Patient says she has been evaluated by cardiology with a negative stress test recently.    11/08/2015  Acute OV  : COPD /O2 dependent  Pt presents for an acute office visit for GOLD D COPD .  Complains of increased SOB, chest tightness, PND, prod cough with clear mucus, nausea and wheezing at times x 2 weeks. Denies any chest congestion, sinus pressure, fever or vomiting, hemoptyisis, discolored mucus, chest pain, palpitations or calf pain.  Previously on chronic steroids daily. Weaned off in Oct 2016 per pt choice .Has had 2-3 steroid taper since then. Most recent was 2 weeks ago . Feels better on prednisone.  Patient is on budesonide nebulizer twice daily and albuterol nebulizer 4 times daily. Says she sometimes only uses budesonide 1 time daily and Albuterol 3 times daily to save money . We discussed using this the correct way.  She has tried several inhalers and nebs in past., has several intolerances.   Patient denies any hemoptysis, orthopnea, PND or chest pain. Prevnar/PVX are utd.  She remains somewhat indepndent, lives alone and drives. Gets winded with minimal act despite O2-this is chronic but seems to be getting progressively worse over last 1-2 years. Says she worries a lot, gets anxious . We talked about using low dose  xanax to help with this.      Review of Systems  Constitutional:   No  weight loss, night sweats,  Fevers, chills, + fatigue, or  lassitude.  HEENT:   No headaches,  Difficulty swallowing,  Tooth/dental problems, or  Sore throat,                No sneezing, itching, ear ache, nasal congestion, post nasal drip,   CV:  No chest pain,  Orthopnea, PND, swelling in lower extremities, anasarca, dizziness, palpitations, syncope.   GI  Notes heartburn, no indigestion, abdominal pain, nausea, vomiting, diarrhea, change in bowel habits, loss of appetite, bloody stools.   Resp:  .    No chest wall deformity  Skin: no rash or lesions.  GU: no dysuria, change in color of urine, no urgency or frequency.  No flank pain, no hematuria   MS:  No joint pain or swelling.  No decreased range of motion.  No back pain.  Psych:  No change in mood or affect.+anxiety.  No memory loss.      Objective:   Physical Exam Filed Vitals:   11/08/15 0901  BP: 124/82  Pulse: 87  Temp: 98 F (36.7 C)  TempSrc: Oral  Height: 5\' 5"  (1.651 m)  Weight: 107 lb (48.535 kg)  SpO2: 100%      GEN: A/Ox3; pleasant , NAD, elderly and frail , on O2 , anxious  HEENT:  Story/AT,  EACs-clear, TMs-wnl, NOSE-clear, THROAT-clear, no lesions  NECK:  Supple w/ fair ROM; no JVD; normal carotid impulses w/o bruits; no thyromegaly or nodules palpated; no lymphadenopathy.  RESP  Diminished in bases w/ no wheezing or rhonchi.no accessory muscle use, no dullness to percussion  CARD:  RRR, no m/r/g  , no peripheral edema, pulses intact, no cyanosis or clubbing.  GI:   Soft & nt; nml bowel sounds; no organomegaly or masses detected.  Musco: Warm bil, no deformities or joint swelling noted.   Neuro: alert, no focal deficits noted.  Anxious   Skin: Warm, no lesions or rashes .       Assessment & Plan:

## 2015-11-08 NOTE — Patient Instructions (Addendum)
Prednisone 10mg   4 tabs for 2 days, then 3 tabs for 2 days, 2 tabs for 2 days, then 1 tab daily , hold at this dose until seen back in office.   Continue on Albuterol Neb Four times a day  .  Continue on Pulmicort Neb Twice daily  .  Continue on Oxygen 3l/m with activity and 2l/m at rest .  May use Xanax 0.25mg  1/2 -1 daily as needed.  Stress reducers with breathing exercises as we discussed.  Follow up in 3 weeks as planned and As needed   Please contact office for sooner follow up if symptoms do not improve or worsen or seek emergency care

## 2015-11-08 NOTE — Assessment & Plan Note (Signed)
Anxiety -suspect is contributing to some of her symptoms as well   Plan  May use Xanax 0.25mg  1/2 -1 daily as needed.  Stress reducers with breathing exercises as we discussed.  Follow up in 3 weeks as planned and As needed   Please contact office for sooner follow up if symptoms do not improve or worsen or seek emergency care

## 2015-11-08 NOTE — Assessment & Plan Note (Signed)
Cont on O2 .  

## 2015-11-08 NOTE — Assessment & Plan Note (Signed)
Recurrent exacerbation  Will try low steroids, to see if this help with frequent flares  If better on return consider decrease to 5mg  daily    Plan  Prednisone 10mg   4 tabs for 2 days, then 3 tabs for 2 days, 2 tabs for 2 days, then 1 tab daily , hold at this dose until seen back in office.   Continue on Albuterol Neb Four times a day  .  Continue on Pulmicort Neb Twice daily  .  Continue on Oxygen 3l/m with activity and 2l/m at rest .  May use Xanax 0.25mg  1/2 -1 daily as needed.  Stress reducers with breathing exercises as we discussed.  Follow up in 3 weeks as planned and As needed   Please contact office for sooner follow up if symptoms do not improve or worsen or seek emergency care

## 2015-11-12 NOTE — Progress Notes (Signed)
Reviewed & agree with plan  

## 2015-11-15 ENCOUNTER — Telehealth: Payer: Self-pay | Admitting: Pulmonary Disease

## 2015-11-15 NOTE — Telephone Encounter (Signed)
Have to see the report to know what test this is

## 2015-11-15 NOTE — Telephone Encounter (Signed)
Spoke with pt. States that she went to see her PCP a week ago. They tested her carbon monoxide level, she states that this was elevated. Pt wants to know what to do about this.  I called Dr. Rhetta Mura office at [phone] [facility]). Spoke with Tiffany, she states that the pt's level was 34.0 Tiffany is going to fax this over to [place] as well.  RA - please advise if there is anything we need to do for this patient. Thanks.

## 2015-11-15 NOTE — Telephone Encounter (Signed)
Will await fax from PCP to forward to RA.

## 2015-11-16 NOTE — Telephone Encounter (Signed)
Labs received from PCP and placed in RA's cubby.   RA please advise.  Thanks!

## 2015-11-26 NOTE — Telephone Encounter (Signed)
Reviewed -CO2 34 on BMET This is related to compensation for her poor lungs

## 2015-11-26 NOTE — Telephone Encounter (Signed)
For RA to review.

## 2015-11-27 NOTE — Telephone Encounter (Signed)
Called and spoke with patient.  Patient notified of lab results. Patient has appointment with RA on Thursday, reminded her of appointment. Nothing further needed.

## 2015-11-29 ENCOUNTER — Ambulatory Visit (INDEPENDENT_AMBULATORY_CARE_PROVIDER_SITE_OTHER): Payer: Medicare Other | Admitting: Pulmonary Disease

## 2015-11-29 ENCOUNTER — Other Ambulatory Visit: Payer: Self-pay | Admitting: *Deleted

## 2015-11-29 ENCOUNTER — Encounter: Payer: Self-pay | Admitting: Pulmonary Disease

## 2015-11-29 VITALS — BP 147/79 | HR 82 | Ht 66.0 in | Wt 107.0 lb

## 2015-11-29 DIAGNOSIS — J9611 Chronic respiratory failure with hypoxia: Secondary | ICD-10-CM

## 2015-11-29 DIAGNOSIS — J439 Emphysema, unspecified: Secondary | ICD-10-CM

## 2015-11-29 MED ORDER — PREDNISONE 10 MG PO TABS: 10.0000 mg | ORAL_TABLET | Freq: Every day | ORAL | Status: DC

## 2015-11-29 MED ORDER — BUDESONIDE 0.25 MG/2ML IN SUSP: 0.2500 mg | Freq: Two times a day (BID) | RESPIRATORY_TRACT | Status: DC

## 2015-11-29 MED ORDER — IPRATROPIUM-ALBUTEROL 0.5-2.5 (3) MG/3ML IN SOLN: 3.0000 mL | Freq: Four times a day (QID) | RESPIRATORY_TRACT | Status: DC

## 2015-11-29 NOTE — Patient Instructions (Signed)
Rx to [facility]  for Atrovent nebs 4 times a day- this can be mixed with albuterol  start taking Xanax -half to 1 tablet twice daily as needed for anxiety  Stay on 10 mg prednisone

## 2015-11-29 NOTE — Assessment & Plan Note (Signed)
Rx to [facility]  for Atrovent nebs 4 times a day- this can be mixed with albuterol  Stay on 10 mg prednisone  She seems to be allergic to most of her medications or cannot afford it due to cost

## 2015-11-29 NOTE — Assessment & Plan Note (Signed)
Persistent dyspnea for unclear reasons  start taking Xanax -half to 1 tablet twice daily as needed for anxiety

## 2015-11-29 NOTE — Progress Notes (Signed)
   Subjective:    Patient ID: Holly Page, female    DOB: [birth year], 80 y.o.   MRN: [id number]  HPI 80 y.o F with Gold D Copd primary emphysema on O2 >GOLD card patient  Hx of DVT -05/2014 (tx w/ xarelto until 12/2014 ) -followed by hematology  Left arm superficial venous thrombus , Xarelto 12/2014 >followed by Hematology >stopped 07/2015  CT chest, on March 19 2015 >that showed no evidence of pulmonary embolism  and stable. Severe COPD changes. Last spirometry showed FEV1 at 30% in 2015.  She has been tried on [place] but was unable to tolerate. Oxygen does drop with pulsing O2 on 2 L was able to keep above 90% on pulsing 3 L. Patient says she has been evaluated by cardiology with a negative stress test recently.   11/29/2015  Chief Complaint  Patient presents with  . Follow-up    breathing still same, no change. Prednisone causing her to sweat, gets very hot and flushed. no chest tightness today.      11/08/2015  Acute OV  : COPD /O2 dependent   Previously on chronic steroids daily. Weaned off in Oct 2016 per pt choice .Has had 2-3 steroid taper since then. Most recent was 2 weeks ago . Down to 10 mg now   Patient is on budesonide nebulizer twice daily and albuterol nebulizer 4 times daily.  She has tried several inhalers and nebs in past., has several intolerances     She was given prescription for Xanax last visit which she has not filled.  Could not afford brovana, spiriva caused rash Completed pulm rehab at [facility]  Significant tests/ events  CT angio 08/2015 neg, severe emphysema 2015 spirometry - FEV1 at 30%   Past Medical History  Diagnosis Date  . Pneumonia   . COPD (chronic obstructive pulmonary disease) ([place])   . Hypertension   . DVT (deep venous thrombosis) ([place]) 1980s, recurrent 2015    Right DVT 2015  on xarelto  . GERD (gastroesophageal reflux disease)   . Lactose intolerance   . Spinal stenosis   . Meningioma ([place])     followed by  Dr Christella Noa     Review of Systems Patient denies significant dyspnea,cough, hemoptysis,  chest pain, palpitations, pedal edema, orthopnea, paroxysmal nocturnal dyspnea, lightheadedness, nausea, vomiting, abdominal or  leg pains    Objective:   Physical Exam  Gen. Pleasant, well-nourished, in no distress ENT - no lesions, no post nasal drip Neck: No JVD, no thyromegaly, no carotid bruits Lungs: no use of accessory muscles, no dullness to percussion, decreased without rales or rhonchi  Cardiovascular: Rhythm regular, heart sounds  normal, no murmurs or gallops, no peripheral edema Musculoskeletal: No deformities, no cyanosis or clubbing        Assessment & Plan:

## 2015-12-07 ENCOUNTER — Other Ambulatory Visit: Payer: Self-pay | Admitting: Adult Health

## 2015-12-07 ENCOUNTER — Telehealth: Payer: Self-pay | Admitting: Pulmonary Disease

## 2015-12-07 NOTE — Telephone Encounter (Signed)
lmomtcb x1 

## 2015-12-10 ENCOUNTER — Telehealth: Payer: Self-pay | Admitting: Family

## 2015-12-10 NOTE — Telephone Encounter (Signed)
Called spoke with pt. She reports Dr. Elsworth Soho prescribed atrovent nebs and she reports she can't tolerate this.  She wants to know if it can be changed to something else. She already has albuterol and budesonide. Please advise Dr. Elsworth Soho thanks

## 2015-12-10 NOTE — Telephone Encounter (Signed)
OK with me.

## 2015-12-10 NOTE — Telephone Encounter (Signed)
Patient called to request a transfer from [place] to [place].

## 2015-12-10 NOTE — Telephone Encounter (Signed)
She is allergic to spiriva & incruse No other alternative

## 2015-12-11 NOTE — Telephone Encounter (Signed)
Spoke with pt. She is aware of RA's response. Nothing further was needed at this time. 

## 2015-12-14 ENCOUNTER — Telehealth: Payer: Self-pay | Admitting: Pulmonary Disease

## 2015-12-14 NOTE — Telephone Encounter (Signed)
Spoke with the pt  She is requesting order for Transport Chair  She wants this to be sent to [facility]  She states that she does not have a PCP right now  Please advise, thanks

## 2015-12-17 NOTE — Telephone Encounter (Signed)
Spoke with the pt and notified we are still waiting for approval from RA

## 2015-12-17 NOTE — Telephone Encounter (Signed)
[phone], pt calling back to f/u

## 2015-12-17 NOTE — Telephone Encounter (Signed)
Spoke with pt, aware of below recs from RA.  Pt expressed understanding.  Nothing further needed.

## 2015-12-17 NOTE — Telephone Encounter (Signed)
She will have to obtain PCP & then try to get this COPD diagnosis will not qualify

## 2015-12-31 ENCOUNTER — Encounter: Payer: Self-pay | Admitting: Family Medicine

## 2015-12-31 ENCOUNTER — Ambulatory Visit (INDEPENDENT_AMBULATORY_CARE_PROVIDER_SITE_OTHER): Payer: Medicare Other | Admitting: Family Medicine

## 2015-12-31 VITALS — BP 150/85 | HR 81 | Temp 98.4°F | Ht 66.0 in | Wt 108.2 lb

## 2015-12-31 DIAGNOSIS — R5381 Other malaise: Secondary | ICD-10-CM

## 2015-12-31 DIAGNOSIS — E876 Hypokalemia: Secondary | ICD-10-CM | POA: Diagnosis not present

## 2015-12-31 DIAGNOSIS — Z9981 Dependence on supplemental oxygen: Secondary | ICD-10-CM

## 2015-12-31 DIAGNOSIS — E0590 Thyrotoxicosis, unspecified without thyrotoxic crisis or storm: Secondary | ICD-10-CM

## 2015-12-31 DIAGNOSIS — J449 Chronic obstructive pulmonary disease, unspecified: Secondary | ICD-10-CM

## 2015-12-31 DIAGNOSIS — J441 Chronic obstructive pulmonary disease with (acute) exacerbation: Secondary | ICD-10-CM

## 2015-12-31 DIAGNOSIS — R61 Generalized hyperhidrosis: Secondary | ICD-10-CM

## 2015-12-31 DIAGNOSIS — L74519 Primary focal hyperhidrosis, unspecified: Secondary | ICD-10-CM | POA: Diagnosis not present

## 2015-12-31 LAB — BASIC METABOLIC PANEL
BUN: 10 mg/dL (ref 6–23)
CO2: 38 meq/L — AB (ref 19–32)
CREATININE: 0.54 mg/dL (ref 0.40–1.20)
Calcium: 10.2 mg/dL (ref 8.4–10.5)
Chloride: 99 mEq/L (ref 96–112)
GFR: 140.58 mL/min (ref >60.00)
Glucose, Bld: 105 mg/dL — ABNORMAL HIGH (ref 70–99)
Potassium: 3.2 mEq/L — ABNORMAL LOW (ref 3.5–5.1)
Sodium: 145 mEq/L (ref 135–145)

## 2015-12-31 LAB — TSH: TSH: 2.37 u[IU]/mL (ref 0.35–4.50)

## 2015-12-31 LAB — T4: T4 TOTAL: 7.6 ug/dL (ref 4.5–12.0)

## 2015-12-31 LAB — T3, FREE: T3 FREE: 4.2 pg/mL (ref 2.3–4.2)

## 2015-12-31 NOTE — Progress Notes (Addendum)
[facility] [address], [place], [place] [address] [phone]  Date:  12/31/2015   Name:  Holly Page   DOB:  Feb 11, 1936   MRN:  [id number]  PCP:  Nance Pear., NP    Chief Complaint: Excessive Sweating   History of Present Illness:  Holly Page is a 80 y.o. very pleasant female patient who presents with the following:  New patient to me today.  History of spinal steonsis, hyperthyroidism, HTN, history of DVT, COPD. Here seeking care for complaint of hot flashes and sweating.  Also, she needs ppw completed to get a home assistant. She is debilitated and cannot keep up with basic chores at home. She is applying or an Environmental consultant at home.  She will have sweats from her bra line up to her face.  She has noted this for about 6-7 months.  She may get a sweat 2-3x a day.  Wonders if this could be related to her K level as it seemed to start when she began taking potassium replacement. Also, she had a slightly low TSH in December- she does not seem aware of this  She does use home O2 and is wearing portable O2 now- she has used it just at home for a year or so, 24/7 for 4-5 months.  She did check her home BP on occasion but her meter is broken. She plans to get a new one She states that her O2 is a bit lower when she is active but does back up to high 90s when she is at honme resting  SpO2 Readings from Last 3 Encounters:  12/31/15 91%  11/29/15 97%  11/08/15 100%     BP Readings from Last 3 Encounters:  12/31/15 150/80  11/29/15 147/79  11/08/15 124/82   Lab Results  Component Value Date   TSH 0.256* 08/31/2015    Patient Active Problem List   Diagnosis Date Noted  . Anxiety 11/08/2015  . Hyperthyroidism 09/07/2015  . Malnutrition of moderate degree 08/31/2015  . Chronic respiratory failure ([place]) 04/19/2015  . Abnormal LFTs 03/19/2015  . Back pain 03/19/2015  . HTN (hypertension)  03/01/2015  . Anemia 12/19/2014  . Calculus of kidney 08/30/2014  . Hematuria 08/11/2014  . History of DVT (deep vein thrombosis)   . Neuralgia neuritis, sciatic nerve 05/22/2014  . 1St degree AV block 04/24/2014  . History of colon polyps 04/24/2014  . Adaptive colitis 04/24/2014  . APC (atrial premature contractions) 04/24/2014  . Benign neoplasm of meninges ([place]) 02/10/2014  . Barrett esophagus 02/03/2014  . Spinal stenosis 12/15/2013  . Allergic rhinitis 11/21/2013  . COPD with emphysema Gold D   . GERD (gastroesophageal reflux disease)   . Lactose intolerance   . DDD (degenerative disc disease), lumbosacral 06/11/2010  . Diffuse cerebrovascular disease 06/11/2010    Past Medical History  Diagnosis Date  . Pneumonia   . COPD (chronic obstructive pulmonary disease) ([place])   . Hypertension   . DVT (deep venous thrombosis) ([place]) 1980s, recurrent 2015    Right DVT 2015  on xarelto  . GERD (gastroesophageal reflux disease)   . Lactose intolerance   . Spinal stenosis   . Meningioma ([place])     followed by Dr Christella Noa    Past Surgical History  Procedure Laterality Date  . Foot surgery    . Cholecystectomy    . Tonsillectomy    . Tubal ligation  Social History  Substance Use Topics  . Smoking status: Former Smoker -- 1.50 packs/day for 56 years    Types: Cigarettes    Quit date: 04/29/2012  . Smokeless tobacco: Never Used     Comment: quit smoking 3 years ago  . Alcohol Use: No     Comment: quit drinking beer 2005    Family History  Problem Relation Age of Onset  . Lupus Sister   . COPD Father     smoker deceased.   [name] Heart attack Son     Allergies  Allergen Reactions  . Incruse Ellipta [Umeclidinium Bromide] Shortness Of Breath  . Tramadol Shortness Of Breath  . Amlodipine     Other reaction(s): SWELLING  . Aspirin     Other reaction(s): PALPITATIONS Other reaction(s): DIFFICULTY BREATHING Heart flutter  . Codeine Other (See Comments)     Hallucinations, can take Tylenol #3 now  . Doxycycline     Other reaction(s): NAUSEA,VOMITING  . Hydrocodone     hallucinations  . Losartan     unknown  . Propoxyphene Nausea Only    Other reaction(s): NAUSEA  . Gabapentin     Other reaction(s): Other (See Comments) She could not swallow the large pill  . Tiotropium Itching and Rash    Medication list has been reviewed and updated.  Current Outpatient Prescriptions on File Prior to Visit  Medication Sig Dispense Refill  . acetaminophen-codeine (TYLENOL #3) 300-30 MG tablet Take 1-2 tablets by mouth every 8 (eight) hours as needed for moderate pain or severe pain. 60 tablet 0  . albuterol (PROVENTIL HFA;VENTOLIN HFA) 108 (90 BASE) MCG/ACT inhaler Inhale 2 puffs into the lungs every 6 (six) hours as needed for wheezing or shortness of breath. 1 Inhaler 3  . albuterol (PROVENTIL) (2.5 MG/3ML) 0.083% nebulizer solution TAKE 3 ML'S BY NEBULIZATION EVERY 6 HOURS AS NEEDED FOR SHORTNESS OF BREATH AND AS NEEDED 375 mL 0  . budesonide (PULMICORT) 0.25 MG/2ML nebulizer solution Take 2 mLs (0.25 mg total) by nebulization 2 (two) times daily. DX: J43.9 120 mL 12  . cholecalciferol (VITAMIN D) 1000 UNITS tablet Take 1,000 Units by mouth daily.    . ferrous sulfate (SLOW IRON) 160 (50 FE) MG TBCR SR tablet Take 1 tablet by mouth daily.    . Multiple Vitamins-Minerals (MULTIVITAMIN & MINERAL PO) Take 1 tablet by mouth daily.    . OXYGEN Inhale 3 L/hr into the lungs.    . potassium chloride SA (K-DUR,KLOR-CON) 20 MEQ tablet Take 1 tablet (20 mEq total) by mouth daily. 30 tablet 5  . ranitidine (ZANTAC) 300 MG tablet Take 1 tablet (300 mg total) by mouth at bedtime. 1 tablet at bedtime 30 tablet 5  . verapamil (CALAN-SR) 240 MG CR tablet Take 1.5 tablets (360 mg total) by mouth daily. 45 tablet 5  . ALPRAZolam (XANAX) 0.25 MG tablet Take 1/2 - 1 tablet by mouth daily as needed (Patient not taking: Reported on 12/31/2015) 30 tablet 0  . loratadine  (CLARITIN) 10 MG tablet Take 1 tablet (10 mg total) by mouth daily. (Patient not taking: Reported on 12/31/2015) 30 tablet 11  . predniSONE (DELTASONE) 10 MG tablet Take 1 tablet (10 mg total) by mouth daily with breakfast. (Patient not taking: Reported on 12/31/2015) 30 tablet 0  . sodium chloride (OCEAN) 0.65 % SOLN nasal spray Place 2 sprays into both nostrils as needed for congestion. Reported on 12/31/2015     No current facility-administered medications on file prior to visit.  Review of Systems:  As per HPI- otherwise negative.   Physical Examination: Filed Vitals:   12/31/15 0903  BP: 150/80  Pulse: 81  Temp: 98.4 F (36.9 C)   Filed Vitals:   12/31/15 0903  Height: 5\' 6"  (1.676 m)  Weight: 108 lb 3.2 oz (49.079 kg)   Body mass index is 17.47 kg/(m^2). Ideal Body Weight: Weight in (lb) to have BMI = 25: 154.6  GEN: WDWN, NAD, Non-toxic, A & O x 3, thin, older lady wearing nasal canula O2 today HEENT: Atraumatic, Normocephalic. Neck supple. No masses, No LAD. Ears and Nose: No external deformity. CV: RRR, No M/G/R. No JVD. No thrill. No extra heart sounds. PULM: CTA B, no wheezes, crackles, rhonchi. No retractions. No resp. distress. No accessory muscle use. EXTR: No c/c/e NEURO Normal gait but slow due to COPD PSYCH: Normally interactive. Conversant. Not depressed or anxious appearing.  Calm demeanor.    Assessment and Plan: Oxygen dependent  Hyperhydrosis disorder - Plan: TSH, T3, free, T4  Chronic obstructive pulmonary disease, unspecified COPD type ([place])  Hyperthyroidism - Plan: TSH, T3, free, T4  Hypokalemia - Plan: Basic Metabolic Panel (BMET)  Debility   Oxygen dependent COPD. Completed PPW for her to get a home assistant as she is not able to keep up with chores Will recheck her thyroid labs due to history of excessive sweating.  Also check BMP as she has started potassium replacement  She will start checking her BP at home again  Signed Lamar Blinks, MD  Received her labs and called 4/4, [facility]- she is taking 20 meq of K right now.  Increase to 30 meq for one week.  Thyroid is normal so no explanation for her sweating Called her again on 4/5 to make sure all is clear.  She reports that she is taking her K20 meq faithfully.  She also has some 10 meq at home.  Advised her to take an extra 10 meq daily for one week, then come in for a repeat lab (lab visit only) in 2 weeks. She will do so  Results for orders placed or performed in visit on 12/31/15  TSH  Result Value Ref Range   TSH 2.37 0.35 - 4.50 uIU/mL  T3, free  Result Value Ref Range   T3, Free 4.2 2.3 - 4.2 pg/mL  T4  Result Value Ref Range   T4, Total 7.6 4.5 - 12.0 ug/dL  Basic Metabolic Panel (BMET)  Result Value Ref Range   Sodium 145 135 - 145 mEq/L   Potassium 3.2 (L) 3.5 - 5.1 mEq/L   Chloride 99 96 - 112 mEq/L   CO2 38 (H) 19 - 32 mEq/L   Glucose, Bld 105 (H) 70 - 99 mg/dL   BUN 10 6 - 23 mg/dL   Creatinine, Ser 0.54 0.40 - 1.20 mg/dL   Calcium 10.2 8.4 - 10.5 mg/dL   GFR 140.58 >60.00 mL/min

## 2015-12-31 NOTE — Patient Instructions (Addendum)
It was good to see you today I will check labs to see if I can find any cause of your sweating.  Take care and let us know if you need any more help regarding your home assistant  The sweating issues may be due to your breathing   Please do check your blood pressure at home and let me know if you continue to run higher than 140/90. Please come and see [place] in 2 months for a recheck

## 2016-01-02 NOTE — Addendum Note (Signed)
Addended by: Lamar Blinks C on: 01/02/2016 11:53 AM   Modules accepted: Orders

## 2016-01-03 ENCOUNTER — Encounter: Payer: Self-pay | Admitting: Pulmonary Disease

## 2016-01-03 ENCOUNTER — Ambulatory Visit (INDEPENDENT_AMBULATORY_CARE_PROVIDER_SITE_OTHER): Payer: Medicare Other | Admitting: Pulmonary Disease

## 2016-01-03 VITALS — BP 183/76 | HR 67 | Ht 65.0 in | Wt 109.0 lb

## 2016-01-03 DIAGNOSIS — I10 Essential (primary) hypertension: Secondary | ICD-10-CM

## 2016-01-03 DIAGNOSIS — J439 Emphysema, unspecified: Secondary | ICD-10-CM | POA: Diagnosis not present

## 2016-01-03 DIAGNOSIS — J9611 Chronic respiratory failure with hypoxia: Secondary | ICD-10-CM

## 2016-01-03 MED ORDER — FLUTICASONE FUROATE-VILANTEROL 100-25 MCG/INH IN AEPB: 1.0000 | INHALATION_SPRAY | Freq: Every day | RESPIRATORY_TRACT | Status: DC

## 2016-01-03 MED ORDER — CLONIDINE HCL 0.1 MG PO TABS: 0.1000 mg | ORAL_TABLET | Freq: Two times a day (BID) | ORAL | Status: DC

## 2016-01-03 NOTE — Progress Notes (Signed)
   Subjective:    Patient ID: Holly Page, female    DOB: May 26, 1936, 80 y.o.   MRN: [id number]  HPI   80 y.o F with Gold D Copd primary emphysema on O2 >GOLD card patient  Hx of DVT -05/2014 (tx w/ xarelto until 12/2014 ) -followed by hematology  Left arm superficial venous thrombus , Xarelto 12/2014 >followed by Hematology >stopped 07/2015   She has been tried on Spiriva , Incruse and [place] but was unable to tolerate.     01/03/2016  Chief Complaint  Patient presents with  . Acute Visit    Chest is very tight, cannot catch breath, hard to breath when walking around, uses 3LPulse when walking, but feels like she is not getting any air at all.  O2 86% upon arrival on 3LP, placed on [place] and O2 went up to 98%   18m FU  Was on 10 mg pred - stopped 3/19 Because she felt this was not helping her she had been on this for a few weeks. Previously on chronic steroids daily. Weaned off in Oct 2016 per pt choice .Had 2-3 steroid taper since then. Most recent 11/2015.  She now reports increasing dyspnea, body aches and just feeling bad  She was given prescription for Xanax last visit which she has again not filled.    Patient is on budesonide nebulizer twice daily and albuterol nebulizer 4 times daily.  She has tried several inhalers and nebs in past., has several intolerances   Blood pressure high today and she is worried about a stroke   Could not afford brovana, spiriva caused rash Completed pulm rehab at [facility]  Significant tests/ events  CT angio 08/2015 neg, severe emphysema 2015 spirometry - FEV1 at 30%   Past Medical History  Diagnosis Date  . Pneumonia   . COPD (chronic obstructive pulmonary disease) ([place])   . Hypertension   . DVT (deep venous thrombosis) ([place]) 1980s, recurrent 2015    Right DVT 2015  on xarelto  . GERD (gastroesophageal reflux disease)   . Lactose intolerance   . Spinal stenosis   . Meningioma ([place])     followed by Dr Christella Noa    Review of  Systems Patient denies significant dyspnea,cough, hemoptysis,  chest pain, palpitations, pedal edema, orthopnea, paroxysmal nocturnal dyspnea, lightheadedness, nausea, vomiting, abdominal or  leg pains      Objective:   Physical Exam  Gen. Pleasant,Thin, in no distress ENT - no lesions, no post nasal drip Neck: No JVD, no thyromegaly, no carotid bruits Lungs: no use of accessory muscles, no dullness to percussion,Decreased without rales or rhonchi  Cardiovascular: Rhythm regular, heart sounds  normal, no murmurs or gallops, no peripheral edema Musculoskeletal: No deformities, no cyanosis or clubbing        Assessment & Plan:

## 2016-01-03 NOTE — Assessment & Plan Note (Signed)
Trial of Breo 1 puff daily- rinse mouth after use Call us for Rx if this works

## 2016-01-03 NOTE — Patient Instructions (Addendum)
BP is high - Rx for clonidine 0.1 mg twice daily #20 Recheck at home  Trial of Breo 1 puff daily- rinse mouth after use Call us for Rx if this works

## 2016-01-03 NOTE — Addendum Note (Signed)
Addended by: Mathis Dad on: 01/03/2016 05:32 PM   Modules accepted: Orders

## 2016-01-03 NOTE — Assessment & Plan Note (Signed)
Continue pulse O2 when she is outside and continuous at home Unclear if her worsening symptoms are related to steroid withdrawal or simply anxiety-she does not want to resume steroids at this time  Chest x-ray today

## 2016-01-03 NOTE — Assessment & Plan Note (Signed)
BP is high - she claims compliance with verapamil, listed allergy to losartan Rx for clonidine 0.1 mg twice daily #20 Recheck at home

## 2016-01-04 ENCOUNTER — Other Ambulatory Visit: Payer: Self-pay | Admitting: Family Medicine

## 2016-01-04 ENCOUNTER — Telehealth: Payer: Self-pay | Admitting: Pulmonary Disease

## 2016-01-04 NOTE — Telephone Encounter (Signed)
Pt was started on clonidine 0.1 mg BID per Dr. Elsworth Soho 01/03/16. Pt is concerned that her BP will bottom out if she takes the clonidine in addition to her current dose of verapamil, as this has happened to her in the past (see 01/04/16 phone note from pulmonology). Explained to pt that BP was very poorly controlled at [place] yesterday, therefore Dr. Elsworth Soho added clonidine for BP control. I went over medication instructions for both verapamil and clonidine w/ pt, verifying dose and frequency. The pt took both verapamil and clonidine this morning as directed and is currently experiencing no side effects. Instructed pt to continue taking both medications as prescribed and to check her BP at home over the weekend. Her granddaughter is going to get a BP cuff for her. Instructed her to call the office if she experiences dizziness/lightheadedness and to call office on Monday to report if she has any issues with the medications over the weekend. She is scheduled for follow up w/ pulmonology 01/17/16.

## 2016-01-04 NOTE — Telephone Encounter (Signed)
Yes, the intent was to add the clonidine to the verapamil

## 2016-01-04 NOTE — Telephone Encounter (Signed)
Can be reached: [phone]   Reason for call: Pt said that pulmonary added another medication for her BP and now it's too low. She is not sure what to do. Requesting call to f/u on BP issues.

## 2016-01-04 NOTE — Telephone Encounter (Signed)
Called spoke with patient and discussed Dr Bari Mantis and Dr Gustavus Bryant recommendations as stated below.  Patient began explaining that she had taken Clonidine in the past (in the 1980s) and this decreased her BP too much.  Her concern is that she is currently taking 1 and 1/2 tablets (360mg ) of Verapamil daily and adding the Clonidine would cause her BP to bottom out.  Advised pt that her BP is too high on just the Verapamil so she does need additional rx and pt agreed with this.  Advised pt that if she is uncomfortable with RA's recs to begin Clonidine in addition to her Verapamil 360mg  then she needs to contact her PCP TODAY for Dr Arlyn Dunning recommendations.  Patient stated that she will call them now.  Nothing further needed; will sign off and route to RA as FYI.

## 2016-01-04 NOTE — Telephone Encounter (Signed)
Patient wants to know if she needs to continue taking the Verapamil along with the Clonidine.  Did not specify in Dr. Bari Mantis note if patient should continue Verapamil.   HTN (hypertension) - Rigoberto Noel, MD at 01/03/2016 5:16 PM     Status: Written Related Problem: HTN (hypertension)   Expand All Collapse All   BP is high - she claims compliance with verapamil, listed allergy to losartan Rx for clonidine 0.1 mg twice daily #20 Recheck at home            Dr. Melvyn Novas, please advise in Dr. Bari Mantis absence.

## 2016-01-04 NOTE — Telephone Encounter (Signed)
thx

## 2016-01-07 NOTE — Telephone Encounter (Signed)
Pt is concerned that her BP has been going "up and down and up and down" since starting the clonidine. She has been taking clonidine and verapamil as prescribed. She is also concerned about possible medication interactions based on information she read online. She states that she shouldn't take the clonidine and verapamil together because they can cause low heart rate, and she shouldn't take the clonidine w/ Tylenol #3. She has pressure behind both eyes since yesterday, no vision changes. She also has mid-abdominal pain before having a BM since yesterday. She feels that this is related to the clonidine and she is very concerned and hesitant to continue taking clonidine. She also stated that she feels like maybe she should just stop taking the verapamil because she doesn't feel that it has helped her in a while.  Her BP readings for today are as follows:  5am: 123/72 9am: 112/69 10am: 139/83  Informed pt that these readings are WNL and represent better control of BP than at her OV last week. Also explained to pt that some variation in BP readings is a normal and expected finding. Educated pt that BP readings will vary based on time of day, last dose of BP medication, activity level, etc. She verbalized understanding and she reports that she had one reading yesterday at 9am that was 99/40 and she knows that's too low.   Please advise if you would like pt to schedule OV or make any medication changes. Dr. Elsworth Soho (pulmomary) is prescribing clonidine. Pt has scheduled f/u w/ him 01/17/16.

## 2016-01-07 NOTE — Telephone Encounter (Signed)
Pt says the clonidine has side effects and it's messing with her eyes. She doesn't want to take it. She is asking for f/u from Dr. Lorelei Pont or nurse to advise.

## 2016-01-07 NOTE — Telephone Encounter (Signed)
She can follow PCP recommendations on this Suggest-measure BP at home and keep a record and take the next PCP appointment

## 2016-01-08 NOTE — Telephone Encounter (Signed)
Pt declined clonidine patch, stating she does not want to take the clonidine because she is afraid of it. Explained to pt that the clonidine patch may help to avoid the ups and downs in BP that have been bothering her (as outlined below by Dr. Larose Kells). Pt still refused new rx. Appt scheduled 01/09/16 at 2pm w/ Dr. Lorelei Pont.

## 2016-01-08 NOTE — Addendum Note (Signed)
Addended by: Kathlene November E on: 01/08/2016 10:44 AM   Modules accepted: Orders

## 2016-01-08 NOTE — Telephone Encounter (Signed)
Patient notified of Dr. Bari Mantis recommendations.  Patient has appointment with PCP tomorrow afternoon (01/09/16) Nothing further needed.

## 2016-01-08 NOTE — Telephone Encounter (Signed)
Thanks [place], I will see her tomorrow. She called back before I had a chance to reach her, sorry to have you bothered!

## 2016-01-08 NOTE — Telephone Encounter (Signed)
Dr. Larose Kells, please advise in PCP absence.

## 2016-01-08 NOTE — Telephone Encounter (Addendum)
BP is actually better,  to avoid rapid variations on her blood pressure  we can change from clonidine by mouth to a  clonidine patch. (Rapid variations are not dangerous per se but seem to be disturbing the patient). A Rx for the patch is ready to be sent. She needs to continue monitoring her BPs and heart rate, if it falls consistently below 50  To 55 let us  know. Follow-up with PCP within few days.

## 2016-01-08 NOTE — Telephone Encounter (Signed)
Pt calling again to find out if she is to continue clonidine. She said that she is feeling drowsy during the day but not able to sleep at night. Pt is requesting call back.  Ph# [phone]

## 2016-01-09 ENCOUNTER — Ambulatory Visit (INDEPENDENT_AMBULATORY_CARE_PROVIDER_SITE_OTHER): Payer: Medicare Other | Admitting: Family Medicine

## 2016-01-09 ENCOUNTER — Encounter: Payer: Self-pay | Admitting: Family Medicine

## 2016-01-09 ENCOUNTER — Telehealth: Payer: Self-pay | Admitting: *Deleted

## 2016-01-09 VITALS — BP 130/72 | HR 68 | Temp 98.0°F | Resp 22 | Ht 66.0 in | Wt 109.0 lb

## 2016-01-09 DIAGNOSIS — I10 Essential (primary) hypertension: Secondary | ICD-10-CM | POA: Diagnosis not present

## 2016-01-09 DIAGNOSIS — R350 Frequency of micturition: Secondary | ICD-10-CM

## 2016-01-09 DIAGNOSIS — E876 Hypokalemia: Secondary | ICD-10-CM | POA: Diagnosis not present

## 2016-01-09 MED ORDER — VERAPAMIL HCL ER 240 MG PO TBCR: 480.0000 mg | EXTENDED_RELEASE_TABLET | Freq: Every day | ORAL | Status: DC

## 2016-01-09 NOTE — Progress Notes (Addendum)
[place] at [facility] [address] [place], [place] [address] [phone]  Date:  01/09/2016   Name:  Holly Page   DOB:  [birth year]   MRN:  [id number]  PCP:  Lamar Blinks, MD    Chief Complaint: Hypertension   History of Present Illness:  Holly Page is a 80 y.o. very pleasant female patient who presents with the following:  I saw this pt to establish care about 1 week ago- HPI as follows:  New patient to me today. History of spinal steonsis, hyperthyroidism, HTN, history of DVT, COPD. Here seeking care for complaint of hot flashes and sweating. Also, she needs ppw completed to get a home assistant. She is debilitated and cannot keep up with basic chores at home. She is applying or an Environmental consultant at home.  She will have sweats from her bra line up to her face. She has noted this for about 6-7 months. She may get a sweat 2-3x a day. Wonders if this could be related to her K level as it seemed to start when she began taking potassium replacement. Also, she had a slightly low TSH in December- she does not seem aware of this  She does use home O2 and is wearing portable O2 now- she has used it just at home for a year or so, 24/7 for 4-5 months.  She did check her home BP on occasion but her meter is broken. She plans to get a new one She states that her O2 is a bit lower when she is active but does back up to high 90s when she is at home resting  At that visit I completed paperwork for a home aid and we adjusted her potassium as her K was low.  She then saw her pulmonologist a couple of days later- he added Breo and clonidine for her HTN. She however decided that she did not want to take clonidine and has called [place] a few times wondering what she should do.  She feels that clonidine is "not a safe medicine." she last took it this am. Her BP looks great today.  She read the papers that came with the clonidine and "got  scared."  She understands that all medications have risks and benefits but is not willing to continue taking clonidine  She also notes that she has frequent urination - about 3x a night.  This has been present for nearly one month now She is not having any dysuria or hematuria  She has been using clonidine for about 4-5 days now.    She would like to think about a motorized chair to help herself get around as she is limited by her pulmonary function She will let me know if she needs any help in this regard   BP Readings from Last 3 Encounters:  01/09/16 142/74  01/03/16 183/76  12/31/15 150/85    Patient Active Problem List   Diagnosis Date Noted  . Oxygen dependent 12/31/2015  . Anxiety 11/08/2015  . Hyperthyroidism 09/07/2015  . Malnutrition of moderate degree 08/31/2015  . Chronic respiratory failure ([place]) 04/19/2015  . Abnormal LFTs 03/19/2015  . Back pain 03/19/2015  . HTN (hypertension) 03/01/2015  . Anemia 12/19/2014  . Calculus of kidney 08/30/2014  . Hematuria 08/11/2014  . History of DVT (deep vein thrombosis)   . Neuralgia neuritis, sciatic nerve 05/22/2014  . 1St degree AV block 04/24/2014  . History of colon polyps  04/24/2014  . Adaptive colitis 04/24/2014  . APC (atrial premature contractions) 04/24/2014  . Benign neoplasm of meninges ([place]) 02/10/2014  . Barrett esophagus 02/03/2014  . Spinal stenosis 12/15/2013  . Allergic rhinitis 11/21/2013  . COPD with emphysema Gold D   . GERD (gastroesophageal reflux disease)   . Lactose intolerance   . DDD (degenerative disc disease), lumbosacral 06/11/2010  . Diffuse cerebrovascular disease 06/11/2010    Past Medical History  Diagnosis Date  . Pneumonia   . COPD (chronic obstructive pulmonary disease) ([place])   . Hypertension   . DVT (deep venous thrombosis) ([place]) 1980s, recurrent 2015    Right DVT 2015  on xarelto  . GERD (gastroesophageal reflux disease)   . Lactose intolerance   . Spinal stenosis   .  Meningioma ([place])     followed by Dr Christella Noa    Past Surgical History  Procedure Laterality Date  . Foot surgery    . Cholecystectomy    . Tonsillectomy    . Tubal ligation      Social History  Substance Use Topics  . Smoking status: Former Smoker -- 1.50 packs/day for 56 years    Types: Cigarettes    Quit date: 04/29/2012  . Smokeless tobacco: Never Used     Comment: quit smoking 3 years ago  . Alcohol Use: No     Comment: quit drinking beer 2005    Family History  Problem Relation Age of Onset  . Lupus Sister   . COPD Father     smoker deceased.   [name] Heart attack Son     Allergies  Allergen Reactions  . Incruse Ellipta [Umeclidinium Bromide] Shortness Of Breath  . Tramadol Shortness Of Breath  . Amlodipine     Other reaction(s): SWELLING  . Aspirin     Other reaction(s): PALPITATIONS Other reaction(s): DIFFICULTY BREATHING Heart flutter  . Codeine Other (See Comments)    Hallucinations, can take Tylenol #3 now  . Doxycycline     Other reaction(s): NAUSEA,VOMITING  . Hydrocodone     hallucinations  . Losartan     unknown  . Propoxyphene Nausea Only    Other reaction(s): NAUSEA  . Gabapentin     Other reaction(s): Other (See Comments) She could not swallow the large pill  . Tiotropium Itching and Rash    Medication list has been reviewed and updated.  Current Outpatient Prescriptions on File Prior to Visit  Medication Sig Dispense Refill  . acetaminophen-codeine (TYLENOL #3) 300-30 MG tablet Take 1-2 tablets by mouth every 8 (eight) hours as needed for moderate pain or severe pain. 60 tablet 0  . albuterol (PROVENTIL HFA;VENTOLIN HFA) 108 (90 BASE) MCG/ACT inhaler Inhale 2 puffs into the lungs every 6 (six) hours as needed for wheezing or shortness of breath. 1 Inhaler 3  . albuterol (PROVENTIL) (2.5 MG/3ML) 0.083% nebulizer solution TAKE 3 ML'S BY NEBULIZATION EVERY 6 HOURS AS NEEDED FOR SHORTNESS OF BREATH AND AS NEEDED 375 mL 0  . budesonide  (PULMICORT) 0.25 MG/2ML nebulizer solution Take 2 mLs (0.25 mg total) by nebulization 2 (two) times daily. DX: J43.9 120 mL 12  . cholecalciferol (VITAMIN D) 1000 UNITS tablet Take 1,000 Units by mouth daily.    . cloNIDine (CATAPRES) 0.1 MG tablet Take 1 tablet (0.1 mg total) by mouth 2 (two) times daily. 28 tablet 0  . ferrous sulfate (SLOW IRON) 160 (50 FE) MG TBCR SR tablet Take 1 tablet by mouth daily.    . fluticasone furoate-vilanterol (BREO  ELLIPTA) 100-25 MCG/INH AEPB Inhale 1 puff into the lungs daily. 2 each 0  . loratadine (CLARITIN) 10 MG tablet Take 1 tablet (10 mg total) by mouth daily. 30 tablet 11  . Multiple Vitamins-Minerals (MULTIVITAMIN & MINERAL PO) Take 1 tablet by mouth daily.    . OXYGEN Inhale 3 L/hr into the lungs.    . potassium chloride SA (K-DUR,KLOR-CON) 20 MEQ tablet Take 1 tablet (20 mEq total) by mouth daily. 30 tablet 5  . ranitidine (ZANTAC) 300 MG tablet Take 1 tablet (300 mg total) by mouth at bedtime. 1 tablet at bedtime 30 tablet 5  . sodium chloride (OCEAN) 0.65 % SOLN nasal spray Place 2 sprays into both nostrils as needed for congestion. Reported on 12/31/2015    . verapamil (CALAN-SR) 240 MG CR tablet Take 1.5 tablets (360 mg total) by mouth daily. 45 tablet 5  . ALPRAZolam (XANAX) 0.25 MG tablet Take 1/2 - 1 tablet by mouth daily as needed (Patient not taking: Reported on 01/09/2016) 30 tablet 0   No current facility-administered medications on file prior to visit.    Review of Systems:  As per HPI- otherwise negative.   Physical Examination: There were no vitals filed for this visit. Filed Vitals:   01/09/16 1351  Height: 5\' 6"  (1.676 m)   There is no weight on file to calculate BMI. Ideal Body Weight: Weight in (lb) to have BMI = 25: 154.6  GEN: WDWN, NAD, Non-toxic, A & O x 3, thin, uses chronic O2 HEENT: Atraumatic, Normocephalic. Neck supple. No masses, No LAD. Ears and Nose: No external deformity. CV: RRR, No M/G/R. No JVD. No thrill.  No extra heart sounds. PULM: CTA B, no wheezes, crackles, rhonchi. No retractions. No resp. distress. No accessory muscle use. EXTR: No c/c/e NEURO in [facility] today because she did not feel she could handle the walk into the office PSYCH: Normally interactive. Conversant. Not depressed or anxious appearing.  Calm demeanor.    Assessment and Plan: Essential hypertension - Plan: verapamil (CALAN-SR) 240 MG CR tablet  Urinary frequency - Plan: Urine culture  Hypokalemia - Plan: Basic metabolic panel  Here today to discuss her HTN.  She has decided that she will not take clonidine.  She has an allergy to ARB which is not well defined.  Decided to increaser her dose of verapamil in order to better control her HTN.  She will start this increased dose (and stop clonidine) tomorrow Will check a urine culture for her urinary frequency BMP to recheck recent hypokalemia.  She is not on any diuretic  Signed Lamar Blinks, MD  Called 4/15 and [facility]- K is back to normal, can return to her usual potassium supplementation at this point

## 2016-01-09 NOTE — Progress Notes (Signed)
Pre visit review using our clinic review tool, if applicable. No additional management support is needed unless otherwise documented below in the visit note. 

## 2016-01-09 NOTE — Telephone Encounter (Signed)
Darreld Mclean, MD at 01/08/2016 1:45 PM     Status: Signed       Expand All Collapse All   Thanks Jacqulyn Bath, I will see her tomorrow. She called back before I had a chance to reach her, sorry to have you bothered!             Dorrene German, RN at 01/08/2016 11:35 AM     Status: Signed       Expand All Collapse All   Pt declined clonidine patch, stating she does not want to take the clonidine because she is afraid of it. Explained to pt that the clonidine patch may help to avoid the ups and downs in BP that have been bothering her (as outlined below by Dr. Larose Kells). Pt still refused new rx. Appt scheduled 01/09/16 at 2pm w/ Dr. Lorelei Pont.             Colon Branch, MD at 01/08/2016 10:37 AM     Status: Addendum       Expand All Collapse All   BP is actually better, to avoid rapid variations on her blood pressure we can change from clonidine by mouth to a clonidine patch. (Rapid variations are not dangerous per se but seem to be disturbing the patient). A Rx for the patch is ready to be sent. She needs to continue monitoring her BPs and heart rate, if it falls consistently below 50 To 55 let us know. Follow-up with PCP within few days.         Revision History       Date/Time User Action    > 01/08/2016 10:43 AM Colon Branch, MD Addend     01/08/2016 10:43 AM Colon Branch, MD Addend     01/08/2016 10:40 AM Colon Branch, MD Sign              Dorrene German, RN at 01/08/2016 10:08 AM     Status: Signed       Expand All Collapse All   Dr. Larose Kells, please advise in PCP absence.            Margot Ables at 01/08/2016 8:49 AM     Status: Signed       Expand All Collapse All   Pt calling again to find out if she is to continue clonidine. She said that she is feeling drowsy during the day but not able to sleep at night. Pt is requesting call back.  Ph# [phone]            Dorrene German, RN at 01/07/2016 12:12 PM     Status: Signed        Expand All Collapse All   Pt is concerned that her BP has been going "up and down and up and down" since starting the clonidine. She has been taking clonidine and verapamil as prescribed. She is also concerned about possible medication interactions based on information she read online. She states that she shouldn't take the clonidine and verapamil together because they can cause low heart rate, and she shouldn't take the clonidine w/ Tylenol #3. She has pressure behind both eyes since yesterday, no vision changes. She also has mid-abdominal pain before having a BM since yesterday. She feels that this is related to the clonidine and she is very concerned and hesitant to continue taking clonidine. She also stated that she feels like maybe she should just stop taking the verapamil because she  doesn't feel that it has helped her in a while.  Her BP readings for today are as follows:  5am: 123/72 9am: 112/69 10am: 139/83  Informed pt that these readings are WNL and represent better control of BP than at her OV last week. Also explained to pt that some variation in BP readings is a normal and expected finding. Educated pt that BP readings will vary based on time of day, last dose of BP medication, activity level, etc. She verbalized understanding and she reports that she had one reading yesterday at 9am that was 99/40 and she knows that's too low.   Please advise if you would like pt to schedule OV or make any medication changes. Dr. Elsworth Soho (pulmomary) is prescribing clonidine. Pt has scheduled f/u w/ him 01/17/16.             Margot Ables at 01/07/2016 9:45 AM     Status: Signed       Expand All Collapse All   Pt says the clonidine has side effects and it's messing with her eyes. She doesn't want to take it. She is asking for f/u from Dr. Lorelei Pont or nurse to advise.            Colon Branch, MD at 01/04/2016 5:06 PM     Status: Signed       Expand All Collapse All   thx              Dorrene German, RN at 01/04/2016 3:08 PM     Status: Signed       Expand All Collapse All   Pt was started on clonidine 0.1 mg BID per Dr. Elsworth Soho 01/03/16. Pt is concerned that her BP will bottom out if she takes the clonidine in addition to her current dose of verapamil, as this has happened to her in the past (see 01/04/16 phone note from pulmonology). Explained to pt that BP was very poorly controlled at [facility] yesterday, therefore Dr. Elsworth Soho added clonidine for BP control. I went over medication instructions for both verapamil and clonidine w/ pt, verifying dose and frequency. The pt took both verapamil and clonidine this morning as directed and is currently experiencing no side effects. Instructed pt to continue taking both medications as prescribed and to check her BP at home over the weekend. Her granddaughter is going to get a BP cuff for her. Instructed her to call the office if she experiences dizziness/lightheadedness and to call office on Monday to report if she has any issues with the medications over the weekend. She is scheduled for follow up w/ pulmonology 01/17/16.            Margot Ables at 01/04/2016 12:19 PM     Status: Signed       Expand All Collapse All   Can be reached: [phone]   Reason for call: Pt said that pulmonary added another medication for her BP and now it's too low. She is not sure what to do. Requesting call to f/u on BP issues.

## 2016-01-09 NOTE — Patient Instructions (Addendum)
We will stop the clonidine and increase your verapamil to 2 whole tablets a day instead. Change to the increased dose of verapamil tomorrow We will check your potassium and urine today.

## 2016-01-10 LAB — URINE CULTURE: Colony Count: 5000

## 2016-01-10 LAB — BASIC METABOLIC PANEL
BUN: 11 mg/dL (ref 6–23)
CHLORIDE: 99 meq/L (ref 96–112)
CO2: 38 mEq/L — ABNORMAL HIGH (ref 19–32)
CREATININE: 0.57 mg/dL (ref 0.40–1.20)
Calcium: 9.7 mg/dL (ref 8.4–10.5)
GFR: 132.07 mL/min (ref >60.00)
GLUCOSE: 83 mg/dL (ref 70–99)
POTASSIUM: 4.4 meq/L (ref 3.5–5.1)
Sodium: 142 mEq/L (ref 135–145)

## 2016-01-12 ENCOUNTER — Encounter: Payer: Self-pay | Admitting: Family Medicine

## 2016-01-16 ENCOUNTER — Other Ambulatory Visit: Payer: Medicare Other

## 2016-01-17 ENCOUNTER — Ambulatory Visit (INDEPENDENT_AMBULATORY_CARE_PROVIDER_SITE_OTHER): Payer: Medicare Other | Admitting: Family Medicine

## 2016-01-17 ENCOUNTER — Encounter: Payer: Self-pay | Admitting: Adult Health

## 2016-01-17 ENCOUNTER — Ambulatory Visit (INDEPENDENT_AMBULATORY_CARE_PROVIDER_SITE_OTHER): Payer: Medicare Other | Admitting: Adult Health

## 2016-01-17 ENCOUNTER — Encounter: Payer: Self-pay | Admitting: Family Medicine

## 2016-01-17 VITALS — BP 138/78 | HR 92 | Ht 65.0 in | Wt 107.0 lb

## 2016-01-17 VITALS — BP 132/80 | HR 94 | Temp 97.5°F | Ht 66.0 in | Wt 106.2 lb

## 2016-01-17 DIAGNOSIS — J439 Emphysema, unspecified: Secondary | ICD-10-CM

## 2016-01-17 DIAGNOSIS — J9611 Chronic respiratory failure with hypoxia: Secondary | ICD-10-CM | POA: Diagnosis not present

## 2016-01-17 DIAGNOSIS — I10 Essential (primary) hypertension: Secondary | ICD-10-CM

## 2016-01-17 MED ORDER — FLUTICASONE FUROATE-VILANTEROL 100-25 MCG/INH IN AEPB: 1.0000 | INHALATION_SPRAY | Freq: Every day | RESPIRATORY_TRACT | Status: DC

## 2016-01-17 NOTE — Assessment & Plan Note (Signed)
Improved on BREO   Plan Continue on BREO 1 puff daily , rinse after use.   Continue on Albuterol Neb Four times a day  .  Continue on Pulmicort Neb Twice daily  .  Continue on Oxygen 3l/m with activity and 2l/m at rest .  Stress reducers with breathing exercises as we discussed.  Follow up in 4 weeks as planned and As needed   Please contact office for sooner follow up if symptoms do not improve or worsen or seek emergency care

## 2016-01-17 NOTE — Patient Instructions (Signed)
Continue your current blood pressure medication- your numbers look good!   Please see me in about 2 months

## 2016-01-17 NOTE — Progress Notes (Signed)
Subjective:    Patient ID: Holly Page, female    DOB: 1936-08-22, 80 y.o.   MRN: [id number]  HPI    Review of Systems     Objective:   Physical Exam        Assessment & Plan:        Subjective:    Patient ID: Holly Page, female    DOB: [birth year], 80 y.o.   MRN: [id number]  HPI  80 y.o. F with Gold D Copd primary emphysema on O2 >GOLD card patient  Hx of DVT -05/2014 (tx w/ xarelto until 12/2014 ) -followed by hematology  Left arm superficial venous thrombus , Xarelto 01/25/15 >followed by Hematology >stopped 07/2015  CT chest, on March 19 2015 >that showed no evidence of pulmonary embolism  and stable. Severe COPD changes. Last spirometry showed FEV1 at 30% in 2015.  She has been tried on [place] but was unable to tolerate. Oxygen does drop with pulsing O2 on 2 L was able to keep above 90% on pulsing 3 L. Patient says she has been evaluated by cardiology with a negative stress test recently.    01/17/2016  Follow up :   : COPD /O2 dependent  Pt presents for a 2 week office visit for GOLD D COPD .  Last visit with more DOE. BREO was added to her regimen.  Says she does feel her breathing is stronger last few days.  Patient is on budesonide nebulizer twice daily and albuterol nebulizer 3-4 times daily.  She has tried several inhalers and nebs in past., has several intolerances.   Patient denies any hemoptysis, orthopnea, PND or chest pain. Prevnar/PVX are utd.  She remains somewhat indepndent, lives alone and drives. Gets meals on wheels , 1 meal daily for 5 days.  Gets winded with minimal act despite O2-this is chronic but seems to be getting progressively worse over last 1-2 years. Says she worries a lot, gets anxious .  Blood pressure was up last visit. Clonidine was added but PCP increased Verapamil and d/c clonidine.  B/p is improved , avg 130-140 at home.    Review of Systems  Constitutional:   No  weight loss, night  sweats,  Fevers, chills, + fatigue, or  lassitude.  HEENT:   No headaches,  Difficulty swallowing,  Tooth/dental problems, or  Sore throat,                No sneezing, itching, ear ache, nasal congestion, post nasal drip,   CV:  No chest pain,  Orthopnea, PND, swelling in lower extremities, anasarca, dizziness, palpitations, syncope.   GI  Notes heartburn, no indigestion, abdominal pain, nausea, vomiting, diarrhea, change in bowel habits, loss of appetite, bloody stools.   Resp:  .    No chest wall deformity  Skin: no rash or lesions.  GU: no dysuria, change in color of urine, no urgency or frequency.  No flank pain, no hematuria   MS:  No joint pain or swelling.  No decreased range of motion.  No back pain.  Psych:  No change in mood or affect.+anxiety.  No memory loss.      Objective:   Physical Exam Filed Vitals:   01/17/16 0907  Pulse: 92  Height: 5\' 5"  (1.651 m)  Weight: 107 lb (48.535 kg)  SpO2: 93%      GEN: A/Ox3; pleasant , NAD, elderly and frail , on O2 , anxious   HEENT:  [place]/AT,  EACs-clear, TMs-wnl, NOSE-clear, THROAT-clear, no lesions  NECK:  Supple w/ fair ROM; no JVD; normal carotid impulses w/o bruits; no thyromegaly or nodules palpated; no lymphadenopathy.  RESP  Diminished in bases w/ no wheezing or rhonchi.no accessory muscle use, no dullness to percussion  CARD:  RRR, no m/r/g  , no peripheral edema, pulses intact, no cyanosis or clubbing.  GI:   Soft & nt; nml bowel sounds; no organomegaly or masses detected.  Musco: Warm bil, no deformities or joint swelling noted.   Neuro: alert, no focal deficits noted.  Anxious   Skin: Warm, no lesions or rashes .   Darica Goren NP-C  [facility]  01/17/2016     Assessment & Plan:

## 2016-01-17 NOTE — Patient Instructions (Signed)
Continue on BREO 1 puff daily , rinse after use.   Continue on Albuterol Neb Four times a day  .  Continue on Pulmicort Neb Twice daily  .  Continue on Oxygen 3l/m with activity and 2l/m at rest .  Stress reducers with breathing exercises as we discussed.  Follow up in 4 weeks as planned and As needed   Please contact office for sooner follow up if symptoms do not improve or worsen or seek emergency care

## 2016-01-17 NOTE — Progress Notes (Signed)
Reviewed & agree with plan  

## 2016-01-17 NOTE — Assessment & Plan Note (Signed)
Continue on Oxygen 3l/m with activity and 2l/m at rest .  Stress reducers with breathing exercises as we discussed.  Follow up in 4 weeks as planned and As needed   Please contact office for sooner follow up if symptoms do not improve or worsen or seek emergency care

## 2016-01-17 NOTE — Progress Notes (Signed)
Pre visit review using our clinic tool,if applicable. No additional management support is needed unless otherwise documented below in the visit note.  

## 2016-01-17 NOTE — Addendum Note (Signed)
Addended by: Mathis Dad on: 01/17/2016 10:01 AM   Modules accepted: Orders

## 2016-01-17 NOTE — Progress Notes (Signed)
[place] at [facility] [address], [place], [place] [address] [phone]  Date:  01/17/2016   Name:  Holly Page   DOB:  [birth year]   MRN:  [id number]  PCP:  Lamar Blinks, MD    Chief Complaint: Follow-up   History of Present Illness:  Holly Page is a 80 y.o. very pleasant female patient who presents with the following:  Last seen here about a week ago- she wanted to stop clonidine so we increased her verapamil a bit. We also checked her BMP due to recent hypoklamia. She had noted urinary frequency- urine culture negative.  BP Readings from Last 3 Encounters:  01/17/16 160/90  01/17/16 138/78  01/09/16 130/72   Here today for a recheck.  She feels like she is doing well. No troublesome SE from her medication change Her SOB is at baseline At home her BP 117- 134/ 70s.   She is back on her 20 meq of her potassium -she has been taking this dose for some time- over a year  Patient Active Problem List   Diagnosis Date Noted  . Oxygen dependent 12/31/2015  . Anxiety 11/08/2015  . Hyperthyroidism 09/07/2015  . Malnutrition of moderate degree 08/31/2015  . Chronic respiratory failure ([place]) 04/19/2015  . Abnormal LFTs 03/19/2015  . Back pain 03/19/2015  . HTN (hypertension) 03/01/2015  . Anemia 12/19/2014  . Calculus of kidney 08/30/2014  . Hematuria 08/11/2014  . History of DVT (deep vein thrombosis)   . Neuralgia neuritis, sciatic nerve 05/22/2014  . 1St degree AV block 04/24/2014  . History of colon polyps 04/24/2014  . Adaptive colitis 04/24/2014  . APC (atrial premature contractions) 04/24/2014  . Benign neoplasm of meninges ([place]) 02/10/2014  . Barrett esophagus 02/03/2014  . Spinal stenosis 12/15/2013  . Allergic rhinitis 11/21/2013  . COPD with emphysema Gold D   . GERD (gastroesophageal reflux disease)   . Lactose intolerance   . DDD (degenerative disc disease), lumbosacral 06/11/2010  .  Diffuse cerebrovascular disease 06/11/2010    Past Medical History  Diagnosis Date  . Pneumonia   . COPD (chronic obstructive pulmonary disease) ([place])   . Hypertension   . DVT (deep venous thrombosis) ([place]) 1980s, recurrent 2015    Right DVT 2015  on xarelto  . GERD (gastroesophageal reflux disease)   . Lactose intolerance   . Spinal stenosis   . Meningioma ([place])     followed by Dr Christella Noa    Past Surgical History  Procedure Laterality Date  . Foot surgery    . Cholecystectomy    . Tonsillectomy    . Tubal ligation      Social History  Substance Use Topics  . Smoking status: Former Smoker -- 1.50 packs/day for 56 years    Types: Cigarettes    Quit date: 04/29/2012  . Smokeless tobacco: Never Used     Comment: quit smoking 3 years ago  . Alcohol Use: No     Comment: quit drinking beer 2005    Family History  Problem Relation Age of Onset  . Lupus Sister   . COPD Father     smoker deceased.   [name] Heart attack Son     Allergies  Allergen Reactions  . Incruse Ellipta [Umeclidinium Bromide] Shortness Of Breath  . Tramadol Shortness Of Breath  . Amlodipine     Other reaction(s): SWELLING  . Aspirin     Other reaction(s): PALPITATIONS Other reaction(s): DIFFICULTY  BREATHING Heart flutter  . Codeine Other (See Comments)    Hallucinations, can take Tylenol #3 now  . Doxycycline     Other reaction(s): NAUSEA,VOMITING  . Hydrocodone     hallucinations  . Losartan     unknown  . Propoxyphene Nausea Only    Other reaction(s): NAUSEA  . Gabapentin     Other reaction(s): Other (See Comments) She could not swallow the large pill  . Tiotropium Itching and Rash    Medication list has been reviewed and updated.  Current Outpatient Prescriptions on File Prior to Visit  Medication Sig Dispense Refill  . acetaminophen-codeine (TYLENOL #3) 300-30 MG tablet Take 1-2 tablets by mouth every 8 (eight) hours as needed for moderate pain or severe pain. 60 tablet 0  .  albuterol (PROVENTIL HFA;VENTOLIN HFA) 108 (90 BASE) MCG/ACT inhaler Inhale 2 puffs into the lungs every 6 (six) hours as needed for wheezing or shortness of breath. 1 Inhaler 3  . albuterol (PROVENTIL) (2.5 MG/3ML) 0.083% nebulizer solution TAKE 3 ML'S BY NEBULIZATION EVERY 6 HOURS AS NEEDED FOR SHORTNESS OF BREATH AND AS NEEDED 375 mL 0  . ALPRAZolam (XANAX) 0.25 MG tablet Take 1/2 - 1 tablet by mouth daily as needed 30 tablet 0  . budesonide (PULMICORT) 0.25 MG/2ML nebulizer solution Take 2 mLs (0.25 mg total) by nebulization 2 (two) times daily. DX: J43.9 120 mL 12  . cholecalciferol (VITAMIN D) 1000 UNITS tablet Take 1,000 Units by mouth daily.    . ferrous sulfate (SLOW IRON) 160 (50 FE) MG TBCR SR tablet Take 1 tablet by mouth daily.    . fluticasone furoate-vilanterol (BREO ELLIPTA) 100-25 MCG/INH AEPB Inhale 1 puff into the lungs daily. 2 each 0  . loratadine (CLARITIN) 10 MG tablet Take 1 tablet (10 mg total) by mouth daily. 30 tablet 11  . Multiple Vitamins-Minerals (MULTIVITAMIN & MINERAL PO) Take 1 tablet by mouth daily.    . OXYGEN Inhale 3 L/hr into the lungs.    . potassium chloride SA (K-DUR,KLOR-CON) 20 MEQ tablet Take 1 tablet (20 mEq total) by mouth daily. 30 tablet 5  . ranitidine (ZANTAC) 300 MG tablet Take 1 tablet (300 mg total) by mouth at bedtime. 1 tablet at bedtime 30 tablet 5  . sodium chloride (OCEAN) 0.65 % SOLN nasal spray Place 2 sprays into both nostrils as needed for congestion. Reported on 12/31/2015    . verapamil (CALAN-SR) 240 MG CR tablet Take 2 tablets (480 mg total) by mouth daily. 60 tablet 5  . fluticasone furoate-vilanterol (BREO ELLIPTA) 100-25 MCG/INH AEPB Inhale 1 puff into the lungs daily. 1 each 6   No current facility-administered medications on file prior to visit.    Review of Systems:  As per HPI- otherwise negative.   Physical Examination: Filed Vitals:   01/17/16 0953  BP: 160/90  Pulse: 94  Temp: 97.5 F (36.4 C)   Filed Vitals:    01/17/16 0953  Height: 5\' 6"  (1.676 m)  Weight: 106 lb 3.2 oz (48.172 kg)   Body mass index is 17.15 kg/(m^2). Ideal Body Weight: Weight in (lb) to have BMI = 25: 154.6  GEN: WDWN, NAD, Non-toxic, A & O x 3, thin, on [place] oxygen HEENT: Atraumatic, Normocephalic. Neck supple. No masses, No LAD. Ears and Nose: No external deformity. CV: RRR, No M/G/R. No JVD. No thrill. No extra heart sounds. PULM: CTA B, no wheezes, crackles, rhonchi. No retractions. No resp. distress. No accessory muscle use. EXTR: No c/c/e NEURO Normal  gait.  PSYCH: Normally interactive. Conversant. Not depressed or anxious appearing.  Calm demeanor.    Assessment and Plan: Essential hypertension  Here today to recheck her BP It appears that she is doing fine on her new dose of verapamil Continue this dose and current dose of K Recheck in 2 months   Signed Lamar Blinks, MD

## 2016-01-30 ENCOUNTER — Encounter: Payer: Self-pay | Admitting: Family Medicine

## 2016-01-30 ENCOUNTER — Ambulatory Visit (INDEPENDENT_AMBULATORY_CARE_PROVIDER_SITE_OTHER): Payer: Medicare Other | Admitting: Family Medicine

## 2016-01-30 VITALS — BP 128/70 | HR 76 | Temp 99.3°F | Ht 66.0 in | Wt 106.4 lb

## 2016-01-30 DIAGNOSIS — Z1239 Encounter for other screening for malignant neoplasm of breast: Secondary | ICD-10-CM

## 2016-01-30 DIAGNOSIS — R61 Generalized hyperhidrosis: Secondary | ICD-10-CM

## 2016-01-30 DIAGNOSIS — F411 Generalized anxiety disorder: Secondary | ICD-10-CM

## 2016-01-30 DIAGNOSIS — J439 Emphysema, unspecified: Secondary | ICD-10-CM

## 2016-01-30 DIAGNOSIS — IMO0001 Reserved for inherently not codable concepts without codable children: Secondary | ICD-10-CM

## 2016-01-30 LAB — CBC
HEMATOCRIT: 37.1 % (ref 36.0–46.0)
HEMOGLOBIN: 11.4 g/dL — AB (ref 12.0–15.0)
MCHC: 30.8 g/dL (ref 30.0–36.0)
MCV: 73.6 fl — AB (ref 78.0–100.0)
Platelets: 274 10*3/uL (ref 150.0–400.0)
RBC: 5.05 Mil/uL (ref 3.87–5.11)
RDW: 15.3 % (ref 11.5–15.5)
WBC: 5.5 10*3/uL (ref 4.0–10.5)

## 2016-01-30 LAB — COMPREHENSIVE METABOLIC PANEL
ALT: 19 U/L (ref 0–35)
AST: 46 U/L — AB (ref 0–37)
Albumin: 4.5 g/dL (ref 3.5–5.2)
Alkaline Phosphatase: 71 U/L (ref 39–117)
BUN: 11 mg/dL (ref 6–23)
CALCIUM: 10.2 mg/dL (ref 8.4–10.5)
CHLORIDE: 98 meq/L (ref 96–112)
CO2: 37 meq/L — AB (ref 19–32)
CREATININE: 0.54 mg/dL (ref 0.40–1.20)
GFR: 140.55 mL/min (ref >60.00)
GLUCOSE: 90 mg/dL (ref 70–99)
Potassium: 3.5 mEq/L (ref 3.5–5.1)
SODIUM: 142 meq/L (ref 135–145)
Total Bilirubin: 0.4 mg/dL (ref 0.2–1.2)
Total Protein: 7.5 g/dL (ref 6.0–8.3)

## 2016-01-30 LAB — TSH: TSH: 1.33 u[IU]/mL (ref 0.35–4.50)

## 2016-01-30 NOTE — Patient Instructions (Addendum)
Let's have you continue your blood pressure medication the way you are currently Let's plan to recheck in 2 months I will ask your lung doctor if he thinks that your sweating is due to your lung disease I would recommend that you get a mammogram- will arrange for you on 5/11 if possible

## 2016-01-30 NOTE — Progress Notes (Signed)
[place] at [facility] [address], [place], [place] [address] [phone]  Date:  01/30/2016   Name:  Holly Page   DOB:  [birth year]   MRN:  [id number]  PCP:  Lamar Blinks, MD    Chief Complaint: Follow-up   History of Present Illness:  Holly Page is a 80 y.o. very pleasant female patient who presents with the following:  Pt with history of HTN, oxygen dependence and significant anxiety.  We recently stopped clonidine and increased her dose of verapamil as she was concerned about SE of clonidine and that her BP was too high.    Hypokalemia was resolved on most recent labs.  I had asked her to come back for a recheck in 2 months.  She is here today with concern that her DBP might be 55 at times.  When this happens she will feel dizzy.  At home her SBP may be 113- 120.  We had increased her verapamil about a month ago as she was concerned that her BP was too high  She notes that she tends to have sweats that can occur during the day or at night.  She feels that her sweats have a bad odor, that she will smell when she just bathed.  She feels that she is always sweating through her clothes.  She has been on 24/7 oxygen since January; prior to this she was on oxygen only at night. Her sweats have gotten worse since she started needing constant O2.   She also notes that she is losing weight despite eating what she feels like is a lot of food She has not had a recent mammogram but is willing to let me arrange one for her  BP Readings from Last 3 Encounters:  01/30/16 155/69  01/17/16 132/80  01/17/16 138/78   Patient Active Problem List   Diagnosis Date Noted  . Oxygen dependent 12/31/2015  . Anxiety 11/08/2015  . Hyperthyroidism 09/07/2015  . Malnutrition of moderate degree 08/31/2015  . Chronic respiratory failure ([place]) 04/19/2015  . Abnormal LFTs 03/19/2015  . Back pain 03/19/2015  . HTN (hypertension)  03/01/2015  . Anemia 12/19/2014  . Calculus of kidney 08/30/2014  . Hematuria 08/11/2014  . History of DVT (deep vein thrombosis)   . Neuralgia neuritis, sciatic nerve 05/22/2014  . 1St degree AV block 04/24/2014  . History of colon polyps 04/24/2014  . Adaptive colitis 04/24/2014  . APC (atrial premature contractions) 04/24/2014  . Benign neoplasm of meninges ([place]) 02/10/2014  . Barrett esophagus 02/03/2014  . Spinal stenosis 12/15/2013  . Allergic rhinitis 11/21/2013  . COPD with emphysema Gold D   . GERD (gastroesophageal reflux disease)   . Lactose intolerance   . DDD (degenerative disc disease), lumbosacral 06/11/2010  . Diffuse cerebrovascular disease 06/11/2010    Past Medical History  Diagnosis Date  . Pneumonia   . COPD (chronic obstructive pulmonary disease) ([place])   . Hypertension   . DVT (deep venous thrombosis) ([place]) 1980s, recurrent 2015    Right DVT 2015  on xarelto  . GERD (gastroesophageal reflux disease)   . Lactose intolerance   . Spinal stenosis   . Meningioma ([place])     followed by Dr Christella Noa    Past Surgical History  Procedure Laterality Date  . Foot surgery    . Cholecystectomy    . Tonsillectomy    . Tubal ligation      Social History  Substance Use Topics  . Smoking status: Former Smoker -- 1.50 packs/day for 56 years    Types: Cigarettes    Quit date: 04/29/2012  . Smokeless tobacco: Never Used     Comment: quit smoking 3 years ago  . Alcohol Use: No     Comment: quit drinking beer 2005    Family History  Problem Relation Age of Onset  . Lupus Sister   . COPD Father     smoker deceased.   [name] Heart attack Son     Allergies  Allergen Reactions  . Incruse Ellipta [Umeclidinium Bromide] Shortness Of Breath  . Tramadol Shortness Of Breath  . Amlodipine     Other reaction(s): SWELLING  . Aspirin     Other reaction(s): PALPITATIONS Other reaction(s): DIFFICULTY BREATHING Heart flutter  . Codeine Other (See Comments)     Hallucinations, can take Tylenol #3 now  . Doxycycline     Other reaction(s): NAUSEA,VOMITING  . Hydrocodone     hallucinations  . Losartan     unknown  . Propoxyphene Nausea Only    Other reaction(s): NAUSEA  . Gabapentin     Other reaction(s): Other (See Comments) She could not swallow the large pill  . Tiotropium Itching and Rash    Medication list has been reviewed and updated.  Current Outpatient Prescriptions on File Prior to Visit  Medication Sig Dispense Refill  . acetaminophen-codeine (TYLENOL #3) 300-30 MG tablet Take 1-2 tablets by mouth every 8 (eight) hours as needed for moderate pain or severe pain. 60 tablet 0  . albuterol (PROVENTIL HFA;VENTOLIN HFA) 108 (90 BASE) MCG/ACT inhaler Inhale 2 puffs into the lungs every 6 (six) hours as needed for wheezing or shortness of breath. 1 Inhaler 3  . albuterol (PROVENTIL) (2.5 MG/3ML) 0.083% nebulizer solution TAKE 3 ML'S BY NEBULIZATION EVERY 6 HOURS AS NEEDED FOR SHORTNESS OF BREATH AND AS NEEDED 375 mL 0  . ALPRAZolam (XANAX) 0.25 MG tablet Take 1/2 - 1 tablet by mouth daily as needed 30 tablet 0  . budesonide (PULMICORT) 0.25 MG/2ML nebulizer solution Take 2 mLs (0.25 mg total) by nebulization 2 (two) times daily. DX: J43.9 120 mL 12  . cholecalciferol (VITAMIN D) 1000 UNITS tablet Take 1,000 Units by mouth daily.    . ferrous sulfate (SLOW IRON) 160 (50 FE) MG TBCR SR tablet Take 1 tablet by mouth daily.    . fluticasone furoate-vilanterol (BREO ELLIPTA) 100-25 MCG/INH AEPB Inhale 1 puff into the lungs daily. 2 each 0  . fluticasone furoate-vilanterol (BREO ELLIPTA) 100-25 MCG/INH AEPB Inhale 1 puff into the lungs daily. 1 each 6  . loratadine (CLARITIN) 10 MG tablet Take 1 tablet (10 mg total) by mouth daily. 30 tablet 11  . Multiple Vitamins-Minerals (MULTIVITAMIN & MINERAL PO) Take 1 tablet by mouth daily.    . OXYGEN Inhale 3 L/hr into the lungs.    . potassium chloride SA (K-DUR,KLOR-CON) 20 MEQ tablet Take 1 tablet (20  mEq total) by mouth daily. 30 tablet 5  . ranitidine (ZANTAC) 300 MG tablet Take 1 tablet (300 mg total) by mouth at bedtime. 1 tablet at bedtime 30 tablet 5  . sodium chloride (OCEAN) 0.65 % SOLN nasal spray Place 2 sprays into both nostrils as needed for congestion. Reported on 12/31/2015    . verapamil (CALAN-SR) 240 MG CR tablet Take 2 tablets (480 mg total) by mouth daily. 60 tablet 5   No current facility-administered medications on file prior to visit.    Review  of Systems:  As per HPI- otherwise negative.   Physical Examination: Filed Vitals:   01/30/16 1112  BP: 155/69  Pulse: 76  Temp: 99.3 F (37.4 C)   Filed Vitals:   01/30/16 1112  Height: 5\' 6"  (1.676 m)  Weight: 106 lb 6.4 oz (48.263 kg)   Body mass index is 17.18 kg/(m^2). Ideal Body Weight: Weight in (lb) to have BMI = 25: 154.6  GEN: WDWN, NAD, Non-toxic, A & O x 3, very thin build, on nasal canula O2 HEENT: Atraumatic, Normocephalic. Neck supple. No masses, No LAD. Ears and Nose: No external deformity. CV: RRR, No M/G/R. No JVD. No thrill. No extra heart sounds. PULM: CTA B, no wheezes, crackles, rhonchi. No retractions. No resp. distress. No accessory muscle use. EXTR: No c/c/e NEURO Normal gait.  PSYCH: Normally interactive. Conversant. Not depressed or anxious appearing.  Calm demeanor.   Assessment and Plan: Sweating - Plan: Comprehensive metabolic panel, TSH, CBC  Pulmonary emphysema, unspecified emphysema type ([place]) - Plan: Comprehensive metabolic panel, TSH, CBC  Screening for breast cancer - Plan: MM Digital Screening  Generalized anxiety disorder here today because she was concerned about some low diastolic BP readings she got at home.  She has intolerance/ allergy to norvasc and is allergic to losartan, borderline hypokalemia and did not want to take clonidine so options are limited.  In the end we decided to stay with her current BP regimen She is worried about sweats and perceived body odor.   Also complaint of weight loss.  Explained that her body is using a tremendous amount of energy to compensate for her end stage COPD but she does not think this could be the reason for her sx.  Explained that I do not know exactly why she is having these sweats.  Will do basic labs for her today and she did agree to get a mammogram Signed Lamar Blinks, MD

## 2016-01-30 NOTE — Progress Notes (Signed)
Pre visit review using our clinic tool,if applicable. No additional management support is needed unless otherwise documented below in the visit note.  

## 2016-01-31 ENCOUNTER — Encounter: Payer: Self-pay | Admitting: Family Medicine

## 2016-02-02 DIAGNOSIS — J449 Chronic obstructive pulmonary disease, unspecified: Secondary | ICD-10-CM | POA: Diagnosis not present

## 2016-02-05 ENCOUNTER — Telehealth: Payer: Self-pay | Admitting: Family Medicine

## 2016-02-05 NOTE — Telephone Encounter (Signed)
Relationship to patient: self Can be reached: [phone]   Reason for call: Pt said that she would like to speak with nurse about this problem she is having with sweating. She did not want to elaborate on what she wanted done just that she wanted to speak to the nurse.

## 2016-02-06 NOTE — Telephone Encounter (Signed)
Returned patiemts call. States she continues to be diaphoretic having to use a towel to wipe the sweat. States he BP continues to run low also. Would like to know if there is anything that could be done.

## 2016-02-06 NOTE — Telephone Encounter (Signed)
Called her back.  She states that her BP is running 117/50. She is taking 2 of the verapamil right now- she notes that one 1.5 pills are not enough and 2 are too strong.  She has multiple issues that preclude other BP medications.   Suggested that she take 1 pill one day and 2 the next- alternate. She will try this She again wants me to tell her why she has "sweat with an odor, it just breaks out over me."  Explained that I do not know what is causing this but I certainly encourage her to seek another opinion to see if someone can help her

## 2016-02-07 ENCOUNTER — Ambulatory Visit: Payer: Medicare Other | Admitting: Adult Health

## 2016-02-08 ENCOUNTER — Telehealth: Payer: Self-pay | Admitting: Adult Health

## 2016-02-08 ENCOUNTER — Other Ambulatory Visit: Payer: Self-pay | Admitting: Adult Health

## 2016-02-08 MED ORDER — ALBUTEROL SULFATE (2.5 MG/3ML) 0.083% IN NEBU: INHALATION_SOLUTION | RESPIRATORY_TRACT | Status: DC

## 2016-02-08 NOTE — Telephone Encounter (Signed)
Spoke with pt. She needs a refill on Albuterol nebs. This has been sent in to her preferred pharmacy. Nothing further was needed.

## 2016-02-11 ENCOUNTER — Other Ambulatory Visit: Payer: Self-pay | Admitting: Adult Health

## 2016-02-11 ENCOUNTER — Ambulatory Visit (HOSPITAL_BASED_OUTPATIENT_CLINIC_OR_DEPARTMENT_OTHER): Payer: Medicare Other

## 2016-02-13 ENCOUNTER — Other Ambulatory Visit: Payer: Self-pay | Admitting: Adult Health

## 2016-02-13 ENCOUNTER — Telehealth: Payer: Self-pay | Admitting: Adult Health

## 2016-02-13 MED ORDER — ALBUTEROL SULFATE HFA 108 (90 BASE) MCG/ACT IN AERS: 2.0000 | INHALATION_SPRAY | Freq: Four times a day (QID) | RESPIRATORY_TRACT | Status: DC | PRN

## 2016-02-13 NOTE — Telephone Encounter (Signed)
Called spoke with pt. She is requesting a refill request on her albuterol inhaler. She is requesting it be sent to [facility] in [place]. I informed her that the medication would be sent in today. She voiced understanding and had no further questions. Rx sent. Nothing further needed.

## 2016-02-14 DIAGNOSIS — D500 Iron deficiency anemia secondary to blood loss (chronic): Secondary | ICD-10-CM | POA: Diagnosis not present

## 2016-02-14 DIAGNOSIS — R748 Abnormal levels of other serum enzymes: Secondary | ICD-10-CM | POA: Diagnosis not present

## 2016-02-20 ENCOUNTER — Ambulatory Visit: Payer: Medicare Other | Admitting: Family Medicine

## 2016-02-20 ENCOUNTER — Telehealth: Payer: Self-pay | Admitting: Family Medicine

## 2016-02-20 DIAGNOSIS — D500 Iron deficiency anemia secondary to blood loss (chronic): Secondary | ICD-10-CM | POA: Diagnosis not present

## 2016-02-20 DIAGNOSIS — R748 Abnormal levels of other serum enzymes: Secondary | ICD-10-CM | POA: Diagnosis not present

## 2016-02-21 ENCOUNTER — Ambulatory Visit (INDEPENDENT_AMBULATORY_CARE_PROVIDER_SITE_OTHER): Payer: Medicare Other | Admitting: Adult Health

## 2016-02-21 ENCOUNTER — Ambulatory Visit (INDEPENDENT_AMBULATORY_CARE_PROVIDER_SITE_OTHER): Payer: Medicare Other | Admitting: Family Medicine

## 2016-02-21 ENCOUNTER — Telehealth: Payer: Self-pay | Admitting: Adult Health

## 2016-02-21 ENCOUNTER — Encounter: Payer: Self-pay | Admitting: Family Medicine

## 2016-02-21 ENCOUNTER — Encounter: Payer: Self-pay | Admitting: Adult Health

## 2016-02-21 VITALS — BP 132/57 | HR 71 | Temp 98.4°F | Ht 65.0 in | Wt 108.8 lb

## 2016-02-21 VITALS — BP 111/64 | HR 75 | Ht 65.0 in | Wt 108.0 lb

## 2016-02-21 DIAGNOSIS — J9611 Chronic respiratory failure with hypoxia: Secondary | ICD-10-CM | POA: Diagnosis not present

## 2016-02-21 DIAGNOSIS — L723 Sebaceous cyst: Secondary | ICD-10-CM | POA: Diagnosis not present

## 2016-02-21 DIAGNOSIS — M7989 Other specified soft tissue disorders: Secondary | ICD-10-CM

## 2016-02-21 DIAGNOSIS — J439 Emphysema, unspecified: Secondary | ICD-10-CM | POA: Diagnosis not present

## 2016-02-21 NOTE — Telephone Encounter (Signed)
[place] good Talked with pt

## 2016-02-21 NOTE — Progress Notes (Signed)
[place] at [facility] [address] [place], [place] [address] [phone]  Date:  02/21/2016   Name:  Holly Page   DOB:  09/03/1936   MRN:  [id number]  PCP:  Lamar Blinks, MD    Chief Complaint: Foot Swelling and lump on back   History of Present Illness:  Holly Page is a 80 y.o. very pleasant female patient who presents with the following:  Chronic medical problems including severe COPD with oxygen dependence.  Here today with complaint of foot swelling for one week. She feels that this is now getting better.  Noted swelling of both of her feet.  NKI.   She also notes a knot on the back of her neck that has been present for about one week. It is tender but has not drained anything. She has rubbed it with alcohol.    Normal myocardial perfusion test with normal EF a year ago Weight is stable No fever or other acute sx Saw pulmonology today Wt Readings from Last 3 Encounters:  02/21/16 108 lb 12.8 oz (49.351 kg)  02/21/16 108 lb (48.988 kg)  01/30/16 106 lb 6.4 oz (48.263 kg)   Weight 108lbs on 12/31/2015  Patient Active Problem List   Diagnosis Date Noted  . Oxygen dependent 12/31/2015  . Anxiety 11/08/2015  . Hyperthyroidism 09/07/2015  . Malnutrition of moderate degree 08/31/2015  . Chronic respiratory failure ([place]) 04/19/2015  . Abnormal LFTs 03/19/2015  . Back pain 03/19/2015  . HTN (hypertension) 03/01/2015  . Anemia 12/19/2014  . Calculus of kidney 08/30/2014  . Hematuria 08/11/2014  . History of DVT (deep vein thrombosis)   . Neuralgia neuritis, sciatic nerve 05/22/2014  . 1St degree AV block 04/24/2014  . History of colon polyps 04/24/2014  . Adaptive colitis 04/24/2014  . APC (atrial premature contractions) 04/24/2014  . Benign neoplasm of meninges ([place]) 02/10/2014  . Barrett esophagus 02/03/2014  . Spinal stenosis 12/15/2013  . Allergic rhinitis 11/21/2013  . COPD with emphysema Gold  D   . GERD (gastroesophageal reflux disease)   . Lactose intolerance   . DDD (degenerative disc disease), lumbosacral 06/11/2010  . Diffuse cerebrovascular disease 06/11/2010    Past Medical History  Diagnosis Date  . Pneumonia   . COPD (chronic obstructive pulmonary disease) ([place])   . Hypertension   . DVT (deep venous thrombosis) ([place]) 1980s, recurrent 2015    Right DVT 2015  on xarelto  . GERD (gastroesophageal reflux disease)   . Lactose intolerance   . Spinal stenosis   . Meningioma ([place])     followed by Dr Christella Noa    Past Surgical History  Procedure Laterality Date  . Foot surgery    . Cholecystectomy    . Tonsillectomy    . Tubal ligation      Social History  Substance Use Topics  . Smoking status: Former Smoker -- 1.50 packs/day for 56 years    Types: Cigarettes    Quit date: 04/29/2012  . Smokeless tobacco: Never Used     Comment: quit smoking 3 years ago  . Alcohol Use: No     Comment: quit drinking beer 2005    Family History  Problem Relation Age of Onset  . Lupus Sister   . COPD Father     smoker deceased.   [name] Heart attack Son     Allergies  Allergen Reactions  . Incruse Ellipta [Umeclidinium Bromide] Shortness Of Breath  .  Tramadol Shortness Of Breath  . Amlodipine     Other reaction(s): SWELLING  . Aspirin     Other reaction(s): PALPITATIONS Other reaction(s): DIFFICULTY BREATHING Heart flutter  . Codeine Other (See Comments)    Hallucinations, can take Tylenol #3 now  . Doxycycline     Other reaction(s): NAUSEA,VOMITING  . Hydrocodone     hallucinations  . Losartan     unknown  . Propoxyphene Nausea Only    Other reaction(s): NAUSEA  . Gabapentin     Other reaction(s): Other (See Comments) She could not swallow the large pill  . Tiotropium Itching and Rash    Medication list has been reviewed and updated.  Current Outpatient Prescriptions on File Prior to Visit  Medication Sig Dispense Refill  . acetaminophen-codeine  (TYLENOL #3) 300-30 MG tablet Take 1-2 tablets by mouth every 8 (eight) hours as needed for moderate pain or severe pain. 60 tablet 0  . albuterol (PROVENTIL HFA;VENTOLIN HFA) 108 (90 Base) MCG/ACT inhaler Inhale 2 puffs into the lungs every 6 (six) hours as needed for wheezing or shortness of breath. 1 Inhaler 5  . albuterol (PROVENTIL) (2.5 MG/3ML) 0.083% nebulizer solution TAKE 3 ML'S BY NEBULIZATION EVERY 6 HOURS AS NEEDED FOR SHORTNESS OF BREATH AND AS NEEDED 375 mL 11  . ALPRAZolam (XANAX) 0.25 MG tablet Take 1/2 - 1 tablet by mouth daily as needed 30 tablet 0  . budesonide (PULMICORT) 0.25 MG/2ML nebulizer solution Take 2 mLs (0.25 mg total) by nebulization 2 (two) times daily. DX: J43.9 120 mL 12  . cholecalciferol (VITAMIN D) 1000 UNITS tablet Take 1,000 Units by mouth daily.    . ferrous sulfate (SLOW IRON) 160 (50 FE) MG TBCR SR tablet Take 1 tablet by mouth daily.    . fluticasone furoate-vilanterol (BREO ELLIPTA) 100-25 MCG/INH AEPB Inhale 1 puff into the lungs daily. 1 each 6  . Multiple Vitamins-Minerals (MULTIVITAMIN & MINERAL PO) Take 1 tablet by mouth daily.    . OXYGEN Inhale 3 L/hr into the lungs.    . potassium chloride SA (K-DUR,KLOR-CON) 20 MEQ tablet Take 1 tablet (20 mEq total) by mouth daily. 30 tablet 5  . ranitidine (ZANTAC) 300 MG tablet Take 1 tablet (300 mg total) by mouth at bedtime. 1 tablet at bedtime 30 tablet 5  . sodium chloride (OCEAN) 0.65 % SOLN nasal spray Place 2 sprays into both nostrils as needed for congestion. Reported on 12/31/2015    . verapamil (CALAN-SR) 240 MG CR tablet Take 2 tablets (480 mg total) by mouth daily. 60 tablet 5   No current facility-administered medications on file prior to visit.    Review of Systems:  As per HPI- otherwise negative.   Physical Examination: Filed Vitals:   02/21/16 1301  BP: 132/57  Pulse: 71  Temp: 98.4 F (36.9 C)   Filed Vitals:   02/21/16 1301  Height: 5\' 5"  (1.651 m)  Weight: 108 lb 12.8 oz  (49.351 kg)   Body mass index is 18.11 kg/(m^2). Ideal Body Weight: Weight in (lb) to have BMI = 25: 149.9  GEN: WDWN, NAD, Non-toxic, A & O x 3. Thin, using oxygen tank per her baseline HEENT: Atraumatic, Normocephalic. Neck supple. No masses, No LAD. Ears and Nose: No external deformity. CV: RRR, No M/G/R. No JVD. No thrill. No extra heart sounds. PULM: CTA B, no wheezes, crackles, rhonchi. No retractions. No resp. distress. No accessory muscle use. EXTR: No c/c/e.  I do not appreciate any swelling or other acute  abnl of her feet NEURO Normal gait.  PSYCH: Normally interactive. Conversant. Not depressed or anxious appearing.  Calm demeanor.  There is a pea sized infected sebaceous cyst or pimple on the right upper back. Prepped with alcohol and was able to express all sebaceous material and pus by squeezing gently. Dressed with band- aid   Assessment and Plan: Foot swelling  Sebaceous cyst  Reassured that she does not appear to have any clinically significant swelling of her feet Expressed pus and sebum from cyst as above.  See patient instructions for more details.     Signed Lamar Blinks, MD

## 2016-02-21 NOTE — Progress Notes (Signed)
Pre visit review using our clinic review tool, if applicable. No additional management support is needed unless otherwise documented below in the visit note. 

## 2016-02-21 NOTE — Patient Instructions (Signed)
Keep the area on your back clean and dry- you may need to gentle squeeze it if any more pus builds back up.   Let me know it if gets large or painful again Your feet look fine to me today- let me know if there are any changes or worsening

## 2016-02-21 NOTE — Progress Notes (Signed)
Reviewed & agree with plan  

## 2016-02-21 NOTE — Progress Notes (Signed)
Subjective:    Patient ID: Holly Page, female    DOB: 05/05/1936, 80 y.o.   MRN: [id number]  HPI    Review of Systems     Objective:   Physical Exam        Assessment & Plan:        Subjective:    Patient ID: Holly Page, female    DOB: 1935-09-30, 80 y.o.   MRN: [id number]  HPI  80 y.o. F with Gold D Copd primary emphysema on O2 >GOLD card patient  Hx of DVT -05/2014 (tx w/ xarelto until 12/2014 ) -followed by hematology  Left arm superficial venous thrombus , Xarelto 01/25/15 >followed by Hematology >stopped 07/2015  CT chest, on March 19 2015 >that showed no evidence of pulmonary embolism  and stable. Severe COPD changes. Last spirometry showed FEV1 at 30% in 2015.  She has been tried on [place] but was unable to tolerate. Oxygen does drop with pulsing O2 on 2 L was able to keep above 90% on pulsing 3 L. Patient says she has been evaluated by cardiology with a negative stress test recently.    01/17/2016  Follow up :   : COPD /O2 dependent  Pt presents for a 4 week office visit for GOLD D COPD .  Currently on BREO and Pulmicort. Along with albuterol nebs Four times a day   Says she does feel her breathing is stronger.  She has tried several inhalers and nebs in past., has several intolerances.   Patient denies any hemoptysis, orthopnea, PND or chest pain. Prevnar/PVX are utd.  She remains somewhat indepndent, lives alone and drives. Gets meals on wheels , 1 meal daily for 5 days.  Denies chest pain, orthopnea, edema or fever.   Review of Systems  Constitutional:   No  weight loss, night sweats,  Fevers, chills, + fatigue, or  lassitude.  HEENT:   No headaches,  Difficulty swallowing,  Tooth/dental problems, or  Sore throat,                No sneezing, itching, ear ache, nasal congestion, post nasal drip,   CV:  No chest pain,  Orthopnea, PND, swelling in lower extremities, anasarca, dizziness, palpitations, syncope.   GI   Notes heartburn, no indigestion, abdominal pain, nausea, vomiting, diarrhea, change in bowel habits, loss of appetite, bloody stools.   Resp:  .    No chest wall deformity  Skin: no rash or lesions.  GU: no dysuria, change in color of urine, no urgency or frequency.  No flank pain, no hematuria   MS:  No joint pain or swelling.  No decreased range of motion.  No back pain.  Psych:  No change in mood or affect.+anxiety.  No memory loss.      Objective:   Physical Exam  Filed Vitals:   02/21/16 1015  BP: 111/64  Pulse: 75  Height: 5\' 5"  (1.651 m)  Weight: 108 lb (48.988 kg)  SpO2: 93%    GEN: A/Ox3; pleasant , NAD, elderly and frail , on O2 , anxious   HEENT:  [place]/AT,  EACs-clear, TMs-wnl, NOSE-clear, THROAT-clear, no lesions  NECK:  Supple w/ fair ROM; no JVD; normal carotid impulses w/o bruits; no thyromegaly or nodules palpated; no lymphadenopathy.  RESP  Diminished in bases w/ no wheezing or rhonchi.no accessory muscle use, no dullness to percussion  CARD:  RRR, no m/r/g  , no peripheral edema, pulses intact, no cyanosis  or clubbing.  GI:   Soft & nt; nml bowel sounds; no organomegaly or masses detected.  Musco: Warm bil, no deformities or joint swelling noted.   Neuro: alert, no focal deficits noted.  Anxious   Skin: Warm, no lesions or rashes .   [name] NP-C  [facility]  02/21/2016     Assessment & Plan:

## 2016-02-21 NOTE — Assessment & Plan Note (Signed)
Compensated on present regimen   Plan  Continue on BREO 1 puff daily , rinse after use.   Continue on Albuterol Neb Four times a day  .  Continue on Pulmicort Neb Twice daily  .  Continue on Oxygen 3l/m with activity and 2l/m at rest .  Stress reducers with breathing exercises as we discussed.  Follow up in 4 weeks as planned and As needed   Please contact office for sooner follow up if symptoms do not improve or worsen or seek emergency care

## 2016-02-21 NOTE — Assessment & Plan Note (Signed)
Compensated on O2  

## 2016-02-21 NOTE — Patient Instructions (Signed)
Continue on BREO 1 puff daily , rinse after use.   Continue on Albuterol Neb Four times a day  .  Continue on Pulmicort Neb Twice daily  .  Continue on Oxygen 3l/m with activity and 2l/m at rest .  Stress reducers with breathing exercises as we discussed.  Follow up in 4 weeks as planned and As needed   Please contact office for sooner follow up if symptoms do not improve or worsen or seek emergency care

## 2016-02-21 NOTE — Telephone Encounter (Signed)
Spoke with the pt  She states that she was told by TP to call with lab results  Her last Hemoglobin was 11.4 and platelet count was 274 Will send to TP as FYI

## 2016-02-22 NOTE — Telephone Encounter (Signed)
-----   Message from Darreld Mclean, MD sent at 02/21/2016  1:08 PM EDT ----- Please no charge this pt for her no show yesterday. Apparently she was told not to go out by her pulmonary clinic due to bad weather JC

## 2016-02-24 DIAGNOSIS — J449 Chronic obstructive pulmonary disease, unspecified: Secondary | ICD-10-CM | POA: Diagnosis not present

## 2016-02-28 DIAGNOSIS — K6389 Other specified diseases of intestine: Secondary | ICD-10-CM | POA: Diagnosis not present

## 2016-02-28 DIAGNOSIS — M47817 Spondylosis without myelopathy or radiculopathy, lumbosacral region: Secondary | ICD-10-CM | POA: Diagnosis not present

## 2016-02-28 DIAGNOSIS — N200 Calculus of kidney: Secondary | ICD-10-CM | POA: Diagnosis not present

## 2016-02-28 DIAGNOSIS — D649 Anemia, unspecified: Secondary | ICD-10-CM | POA: Diagnosis not present

## 2016-02-28 DIAGNOSIS — D500 Iron deficiency anemia secondary to blood loss (chronic): Secondary | ICD-10-CM | POA: Diagnosis not present

## 2016-02-28 DIAGNOSIS — N289 Disorder of kidney and ureter, unspecified: Secondary | ICD-10-CM | POA: Diagnosis not present

## 2016-02-28 DIAGNOSIS — K7689 Other specified diseases of liver: Secondary | ICD-10-CM | POA: Diagnosis not present

## 2016-02-28 DIAGNOSIS — J432 Centrilobular emphysema: Secondary | ICD-10-CM | POA: Diagnosis not present

## 2016-03-03 ENCOUNTER — Ambulatory Visit: Payer: Medicare Other | Admitting: Family Medicine

## 2016-03-03 ENCOUNTER — Ambulatory Visit (HOSPITAL_BASED_OUTPATIENT_CLINIC_OR_DEPARTMENT_OTHER): Payer: Medicare Other | Admitting: Family

## 2016-03-03 ENCOUNTER — Encounter: Payer: Self-pay | Admitting: Family

## 2016-03-03 ENCOUNTER — Other Ambulatory Visit (HOSPITAL_BASED_OUTPATIENT_CLINIC_OR_DEPARTMENT_OTHER): Payer: Medicare Other

## 2016-03-03 VITALS — BP 145/63 | HR 79 | Temp 98.1°F | Resp 20 | Ht 65.0 in | Wt 106.0 lb

## 2016-03-03 DIAGNOSIS — Z86718 Personal history of other venous thrombosis and embolism: Secondary | ICD-10-CM

## 2016-03-03 LAB — CBC WITH DIFFERENTIAL (CANCER CENTER ONLY)
BASO#: 0 10*3/uL (ref 0.0–0.2)
BASO%: 0.4 % (ref 0.0–2.0)
EOS ABS: 0.1 10*3/uL (ref 0.0–0.5)
EOS%: 1.7 % (ref 0.0–7.0)
HEMATOCRIT: 38 % (ref 34.8–46.6)
HEMOGLOBIN: 11.2 g/dL — AB (ref 11.6–15.9)
LYMPH#: 1.9 10*3/uL (ref 0.9–3.3)
LYMPH%: 35.7 % (ref 14.0–48.0)
MCH: 22.8 pg — AB (ref 26.0–34.0)
MCHC: 29.5 g/dL — ABNORMAL LOW (ref 32.0–36.0)
MCV: 77 fL — AB (ref 81–101)
MONO#: 0.5 10*3/uL (ref 0.1–0.9)
MONO%: 9 % (ref 0.0–13.0)
NEUT%: 53.2 % (ref 39.6–80.0)
NEUTROS ABS: 2.8 10*3/uL (ref 1.5–6.5)
Platelets: 306 10*3/uL (ref 145–400)
RBC: 4.91 10*6/uL (ref 3.70–5.32)
RDW: 15.2 % (ref 11.1–15.7)
WBC: 5.3 10*3/uL (ref 3.9–10.0)

## 2016-03-03 LAB — CMP (CANCER CENTER ONLY)
ALBUMIN: 4 g/dL (ref 3.3–5.5)
ALT(SGPT): 33 U/L (ref 10–47)
AST: 66 U/L — ABNORMAL HIGH (ref 11–38)
Alkaline Phosphatase: 69 U/L (ref 26–84)
BUN, Bld: 11 mg/dL (ref 7–22)
CALCIUM: 10 mg/dL (ref 8.0–10.3)
CHLORIDE: 99 meq/L (ref 98–108)
CO2: 34 mEq/L — ABNORMAL HIGH (ref 18–33)
CREATININE: 0.6 mg/dL (ref 0.6–1.2)
Glucose, Bld: 141 mg/dL — ABNORMAL HIGH (ref 73–118)
Potassium: 4.4 mEq/L (ref 3.3–4.7)
SODIUM: 144 meq/L (ref 128–145)
TOTAL PROTEIN: 7.5 g/dL (ref 6.4–8.1)
Total Bilirubin: 0.7 mg/dl (ref 0.20–1.60)

## 2016-03-03 NOTE — Progress Notes (Signed)
Hematology and Oncology Follow Up Visit  ARIYAH NOON [id number] Sep 19, 1936 80 y.o. 03/03/2016   Principle Diagnosis:  Thromboembolic disease of the right thigh Superficial thrombus of the left arm   Current Therapy:   Observation     Interim History:  Ms. Anis is here today for follow-up. She is doing fairly well. She still has some post phlebitic pain in the right thigh that comes and goes. No swell, numbness or tingling in her extremities.  She had a virtual colonoscopy at [facility] last week which was negative. She had had some dark tarry stool but attributes this to taking an oral iron supplement. She has had no episodes of bleeding.  No fatigue, fever, chills, n/v, cough, rash, dizziness, chest pain, palpitations, abdomina pain or changes in bowel or bladder habits. She is still having urinary frequency and plans to follow-up with Dr. Estill Dooms this week.  She has started taking Nexium daily and feels that this has helped improve her SOB. She is still on 3L supplemental O2 24 hours a day.  She has maintained a good appetite and is staying well hydrated. Her weight is stable.   Medications:    Medication List       This list is accurate as of: 03/03/16 11:36 AM.  Always use your most recent med list.               acetaminophen-codeine 300-30 MG tablet  Commonly known as:  TYLENOL #3  Take 1-2 tablets by mouth every 8 (eight) hours as needed for moderate pain or severe pain.     albuterol (2.5 MG/3ML) 0.083% nebulizer solution  Commonly known as:  PROVENTIL  TAKE 3 ML'S BY NEBULIZATION EVERY 6 HOURS AS NEEDED FOR SHORTNESS OF BREATH AND AS NEEDED     albuterol 108 (90 Base) MCG/ACT inhaler  Commonly known as:  PROVENTIL HFA;VENTOLIN HFA  Inhale 2 puffs into the lungs every 6 (six) hours as needed for wheezing or shortness of breath.     ALPRAZolam 0.25 MG tablet  Commonly known as:  XANAX  Take 1/2 - 1 tablet by mouth daily as needed     budesonide  0.25 MG/2ML nebulizer solution  Commonly known as:  PULMICORT  Take 2 mLs (0.25 mg total) by nebulization 2 (two) times daily. DX: J43.9     cholecalciferol 1000 units tablet  Commonly known as:  VITAMIN D  Take 1,000 Units by mouth daily.     fluticasone furoate-vilanterol 100-25 MCG/INH Aepb  Commonly known as:  BREO ELLIPTA  Inhale 1 puff into the lungs daily.     MULTIVITAMIN & MINERAL PO  Take 1 tablet by mouth daily.     OXYGEN  Inhale 3 L/hr into the lungs.     potassium chloride SA 20 MEQ tablet  Commonly known as:  K-DUR,KLOR-CON  Take 1 tablet (20 mEq total) by mouth daily.     ranitidine 300 MG tablet  Commonly known as:  ZANTAC  Take 1 tablet (300 mg total) by mouth at bedtime. 1 tablet at bedtime     SLOW IRON 160 (50 Fe) MG Tbcr SR tablet  Generic drug:  ferrous sulfate  Take 1 tablet by mouth daily.     sodium chloride 0.65 % Soln nasal spray  Commonly known as:  OCEAN  Place 2 sprays into both nostrils as needed for congestion. Reported on 12/31/2015     verapamil 240 MG CR tablet  Commonly known as:  CALAN-SR  Take 2 tablets (480 mg total) by mouth daily.        Allergies:  Allergies  Allergen Reactions  . Incruse Ellipta [Umeclidinium Bromide] Shortness Of Breath  . Tramadol Shortness Of Breath  . Amlodipine     Other reaction(s): SWELLING  . Aspirin     Other reaction(s): PALPITATIONS Other reaction(s): DIFFICULTY BREATHING Heart flutter  . Codeine Other (See Comments)    Hallucinations, can take Tylenol #3 now  . Doxycycline     Other reaction(s): NAUSEA,VOMITING  . Hydrocodone     hallucinations  . Losartan     unknown  . Propoxyphene Nausea Only    Other reaction(s): NAUSEA  . Gabapentin     Other reaction(s): Other (See Comments) She could not swallow the large pill  . Tiotropium Itching and Rash    Past Medical History, Surgical history, Social history, and Family History were reviewed and updated.  Review of Systems: All  other 10 point review of systems is negative.   Physical Exam:  height is 5\' 5"  (1.651 m) and weight is 106 lb (48.081 kg). Her oral temperature is 98.1 F (36.7 C). Her blood pressure is 145/63 and her pulse is 79. Her respiration is 20 and oxygen saturation is 90%.   Wt Readings from Last 3 Encounters:  03/03/16 106 lb (48.081 kg)  02/21/16 108 lb 12.8 oz (49.351 kg)  02/21/16 108 lb (48.988 kg)    Ocular: Sclerae unicteric, pupils equal, round and reactive to light Ear-nose-throat: Oropharynx clear, dentition fair Lymphatic: No cervical supraclavicular or axillary adenopathy Lungs no rales or rhonchi, good excursion bilaterally Heart regular rate and rhythm, no murmur appreciated Abd soft, nontender, positive bowel sounds, no liver or spleen tip palpated on exam MSK no focal spinal tenderness, no joint edema Neuro: non-focal, well-oriented, appropriate affect Breasts: Deferred  Lab Results  Component Value Date   WBC 5.3 03/03/2016   HGB 11.2* 03/03/2016   HCT 38.0 03/03/2016   MCV 77* 03/03/2016   PLT 306 03/03/2016   Lab Results  Component Value Date   FERRITIN 187 11/02/2015   IRON 87 11/02/2015   TIBC 329 11/02/2015   UIBC 242 11/02/2015   IRONPCTSAT 26 11/02/2015   Lab Results  Component Value Date   RBC 4.91 03/03/2016   No results found for: KPAFRELGTCHN, LAMBDASER, KAPLAMBRATIO No results found for: IGGSERUM, IGA, IGMSERUM No results found for: Odetta Pink, SPEI   Chemistry      Component Value Date/Time   NA 142 01/30/2016 1158   NA 145 11/02/2015 1013   NA 145 08/07/2015 0850   K 3.5 01/30/2016 1158   K 3.5 11/02/2015 1013   K 3.4* 08/07/2015 0850   CL 98 01/30/2016 1158   CL 99 11/02/2015 1013   CO2 37* 01/30/2016 1158   CO2 35* 11/02/2015 1013   CO2 36* 08/07/2015 0850   BUN 11 01/30/2016 1158   BUN 12 11/02/2015 1013   BUN 7.6 08/07/2015 0850   CREATININE 0.54 01/30/2016 1158    CREATININE 0.6 11/02/2015 1013   CREATININE 0.7 08/07/2015 0850      Component Value Date/Time   CALCIUM 10.2 01/30/2016 1158   CALCIUM 10.2 11/02/2015 1013   CALCIUM 9.8 08/07/2015 0850   ALKPHOS 71 01/30/2016 1158   ALKPHOS 67 11/02/2015 1013   ALKPHOS 74 08/07/2015 0850   AST 46* 01/30/2016 1158   AST 53* 11/02/2015 1013   AST 55* 08/07/2015 0850   ALT  19 01/30/2016 1158   ALT 27 11/02/2015 1013   ALT 22 08/07/2015 0850   BILITOT 0.4 01/30/2016 1158   BILITOT 0.60 11/02/2015 1013   BILITOT 0.35 08/07/2015 0850     Impression and Plan: Ms. Haney is 80 year old African-American female with history of multiple DVT's and a superficial blood clot in her left arm. She has done well off of Xarelto and so far there has been no evidence of recurrent thrombus. She has some post phlebitic pain in the right thigh that some and goes.  She states that her breathing has improved somewhat since starting on Nexium daily. This has given her a good bit of relief.  We will plan to see her back in 4 months for repeat labs and follow-up. She knows to call here with any questions or concerns. We can certainly see her sooner if need be.   Eliezer Bottom, NP 6/5/201711:36 AM

## 2016-03-04 DIAGNOSIS — J449 Chronic obstructive pulmonary disease, unspecified: Secondary | ICD-10-CM | POA: Diagnosis not present

## 2016-03-04 LAB — D-DIMER, QUANTITATIVE: D-DIMER: 0.57 mg/L FEU — ABNORMAL HIGH (ref 0.00–0.49)

## 2016-03-12 ENCOUNTER — Encounter: Payer: Self-pay | Admitting: Physician Assistant

## 2016-03-12 ENCOUNTER — Ambulatory Visit (INDEPENDENT_AMBULATORY_CARE_PROVIDER_SITE_OTHER): Payer: Medicare Other | Admitting: Physician Assistant

## 2016-03-12 VITALS — BP 132/65 | HR 76 | Temp 98.0°F | Resp 16 | Ht 65.0 in | Wt 103.5 lb

## 2016-03-12 DIAGNOSIS — R7989 Other specified abnormal findings of blood chemistry: Secondary | ICD-10-CM | POA: Diagnosis not present

## 2016-03-12 DIAGNOSIS — R1010 Upper abdominal pain, unspecified: Secondary | ICD-10-CM | POA: Diagnosis not present

## 2016-03-12 DIAGNOSIS — M79651 Pain in right thigh: Secondary | ICD-10-CM | POA: Diagnosis not present

## 2016-03-12 NOTE — Patient Instructions (Signed)
Please continue chronic medications as directed. Use your pain medication as directed when needed. You have irritated the muscles from hitting your leg. This has caused mild bruising. This will resolve over a few weeks. The pain should resolve sooner.  Apply heating pad to the area for 10 minutes a couple of times per day.  Follow-up if symptoms are not resolving.

## 2016-03-12 NOTE — Progress Notes (Signed)
Patient presents to clinic today c/o pain in lateral upper R thigh over the past week with mild bruising. Denies weakness, redness, swelling, numbness or tingling of extremity. Is walking without difficulty. Pain described as an ache at the site of bruise. Endorses she did bump her leg into a table but though nothing of it.   Past Medical History  Diagnosis Date  . Pneumonia   . COPD (chronic obstructive pulmonary disease) ([place])   . Hypertension   . DVT (deep venous thrombosis) ([place]) 1980s, recurrent 2015    Right DVT 2015  on xarelto  . GERD (gastroesophageal reflux disease)   . Lactose intolerance   . Spinal stenosis   . Meningioma [facility])     followed by Dr Christella Noa    Current Outpatient Prescriptions on File Prior to Visit  Medication Sig Dispense Refill  . acetaminophen-codeine (TYLENOL #3) 300-30 MG tablet Take 1-2 tablets by mouth every 8 (eight) hours as needed for moderate pain or severe pain. 60 tablet 0  . albuterol (PROVENTIL HFA;VENTOLIN HFA) 108 (90 Base) MCG/ACT inhaler Inhale 2 puffs into the lungs every 6 (six) hours as needed for wheezing or shortness of breath. 1 Inhaler 5  . albuterol (PROVENTIL) (2.5 MG/3ML) 0.083% nebulizer solution TAKE 3 ML'S BY NEBULIZATION EVERY 6 HOURS AS NEEDED FOR SHORTNESS OF BREATH AND AS NEEDED 375 mL 11  . ALPRAZolam (XANAX) 0.25 MG tablet Take 1/2 - 1 tablet by mouth daily as needed 30 tablet 0  . budesonide (PULMICORT) 0.25 MG/2ML nebulizer solution Take 2 mLs (0.25 mg total) by nebulization 2 (two) times daily. DX: J43.9 120 mL 12  . cholecalciferol (VITAMIN D) 1000 UNITS tablet Take 1,000 Units by mouth daily.    . fluticasone furoate-vilanterol (BREO ELLIPTA) 100-25 MCG/INH AEPB Inhale 1 puff into the lungs daily. 1 each 6  . Multiple Vitamins-Minerals (MULTIVITAMIN & MINERAL PO) Take 1 tablet by mouth daily.    . OXYGEN Inhale 3 L/hr into the lungs.    . potassium chloride SA (K-DUR,KLOR-CON) 20 MEQ tablet Take 1 tablet (20 mEq  total) by mouth daily. 30 tablet 5  . ranitidine (ZANTAC) 300 MG tablet Take 1 tablet (300 mg total) by mouth at bedtime. 1 tablet at bedtime 30 tablet 5  . sodium chloride (OCEAN) 0.65 % SOLN nasal spray Place 2 sprays into both nostrils as needed for congestion. Reported on 12/31/2015    . verapamil (CALAN-SR) 240 MG CR tablet Take 2 tablets (480 mg total) by mouth daily. (Patient taking differently: Take 480 mg by mouth daily. Alternate between [1] daily and [2] daily) 60 tablet 5   No current facility-administered medications on file prior to visit.    Allergies  Allergen Reactions  . Incruse Ellipta [Umeclidinium Bromide] Shortness Of Breath  . Tramadol Shortness Of Breath  . Amlodipine     Other reaction(s): SWELLING  . Aspirin     Other reaction(s): PALPITATIONS Other reaction(s): DIFFICULTY BREATHING Heart flutter  . Codeine Other (See Comments)    Hallucinations, can take Tylenol #3 now  . Doxycycline     Other reaction(s): NAUSEA,VOMITING  . Hydrocodone     hallucinations  . Losartan     unknown  . Propoxyphene Nausea Only    Other reaction(s): NAUSEA  . Gabapentin     Other reaction(s): Other (See Comments) She could not swallow the large pill  . Tiotropium Itching and Rash    Family History  Problem Relation Age of Onset  . Lupus  Sister   . COPD Father     smoker deceased.   [name] Heart attack Son     Social History   Social History  . Marital Status: Divorced    Spouse Name: N/A  . Number of Children: 4  . Years of Education: college   Occupational History  . Retired    Social History Main Topics  . Smoking status: Former Smoker -- 1.50 packs/day for 56 years    Types: Cigarettes    Quit date: 04/29/2012  . Smokeless tobacco: Never Used     Comment: quit smoking 3 years ago  . Alcohol Use: No     Comment: quit drinking beer 2005  . Drug Use: No  . Sexual Activity: Not Asked   Other Topics Concern  . None   Social History Narrative   Patient  lives at home alone- divorced, no pets.     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  All other ROS are negative.  BP 132/65 mmHg  Pulse 76  Temp(Src) 98 F (36.7 C) (Oral)  Resp 16  Ht 5\' 5"  (1.651 m)  Wt 103 lb 8 oz (46.947 kg)  BMI 17.22 kg/m2  SpO2 99%  Physical Exam  Constitutional: She is oriented to person, place, and time and well-developed, well-nourished, and in no distress.  HENT:  Head: Normocephalic and atraumatic.  Cardiovascular: Normal rate, regular rhythm, normal heart sounds and intact distal pulses.   Pulmonary/Chest: Effort normal and breath sounds normal. No respiratory distress. She has no wheezes. She has no rales. She exhibits no tenderness.  Musculoskeletal:       Legs: Neurological: She is alert and oriented to person, place, and time.  Vitals reviewed.   Recent Results (from the past 2160 hour(s))  TSH     Status: None   Collection Time: 12/31/15  9:37 AM  Result Value Ref Range   TSH 2.37 0.35 - 4.50 uIU/mL  T3, free     Status: None   Collection Time: 12/31/15  9:37 AM  Result Value Ref Range   T3, Free 4.2 2.3 - 4.2 pg/mL  T4     Status: None   Collection Time: 12/31/15  9:37 AM  Result Value Ref Range   T4, Total 7.6 4.5 - 12.0 ug/dL  Basic Metabolic Panel (BMET)     Status: Abnormal   Collection Time: 12/31/15  9:37 AM  Result Value Ref Range   Sodium 145 135 - 145 mEq/L   Potassium 3.2 (L) 3.5 - 5.1 mEq/L   Chloride 99 96 - 112 mEq/L   CO2 38 (H) 19 - 32 mEq/L   Glucose, Bld 105 (H) 70 - 99 mg/dL   BUN 10 6 - 23 mg/dL   Creatinine, Ser 0.54 0.40 - 1.20 mg/dL   Calcium 10.2 8.4 - 10.5 mg/dL   GFR 140.58 >60.00 mL/min  Basic metabolic panel     Status: Abnormal   Collection Time: 01/09/16  2:32 PM  Result Value Ref  Range   Sodium 142 135 - 145 mEq/L   Potassium 4.4 3.5 - 5.1 mEq/L   Chloride 99 96 - 112 mEq/L   CO2 38 (  H) 19 - 32 mEq/L   Glucose, Bld 83 70 - 99 mg/dL   BUN 11 6 - 23 mg/dL   Creatinine, Ser 0.57 0.40 - 1.20 mg/dL   Calcium 9.7 8.4 - 10.5 mg/dL   GFR 132.07 >60.00 mL/min  Urine culture     Status: None   Collection Time: 01/09/16  2:32 PM  Result Value Ref Range   Colony Count 5,000 COLONIES/ML    Organism ID, Bacteria Insignificant Growth   Comprehensive metabolic panel     Status: Abnormal   Collection Time: 01/30/16 11:58 AM  Result Value Ref Range   Sodium 142 135 - 145 mEq/L   Potassium 3.5 3.5 - 5.1 mEq/L   Chloride 98 96 - 112 mEq/L   CO2 37 (H) 19 - 32 mEq/L   Glucose, Bld 90 70 - 99 mg/dL   BUN 11 6 - 23 mg/dL   Creatinine, Ser 0.54 0.40 - 1.20 mg/dL   Total Bilirubin 0.4 0.2 - 1.2 mg/dL   Alkaline Phosphatase 71 39 - 117 U/L   AST 46 (H) 0 - 37 U/L   ALT 19 0 - 35 U/L   Total Protein 7.5 6.0 - 8.3 g/dL   Albumin 4.5 3.5 - 5.2 g/dL   Calcium 10.2 8.4 - 10.5 mg/dL   GFR 140.55 >60.00 mL/min  TSH     Status: None   Collection Time: 01/30/16 11:58 AM  Result Value Ref Range   TSH 1.33 0.35 - 4.50 uIU/mL  CBC     Status: Abnormal   Collection Time: 01/30/16 11:58 AM  Result Value Ref Range   WBC 5.5 4.0 - 10.5 K/uL   RBC 5.05 3.87 - 5.11 Mil/uL   Platelets 274.0 150.0 - 400.0 K/uL   Hemoglobin 11.4 (L) 12.0 - 15.0 g/dL   HCT 37.1 36.0 - 46.0 %   MCV 73.6 (L) 78.0 - 100.0 fl   MCHC 30.8 30.0 - 36.0 g/dL   RDW 15.3 11.5 - 15.5 %  CBC with Differential [facility])     Status: Abnormal   Collection Time: 03/03/16 11:01 AM  Result Value Ref Range   WBC 5.3 3.9 - 10.0 10e3/uL   RBC 4.91 3.70 - 5.32 10e6/uL   HGB 11.2 (L) 11.6 - 15.9 g/dL   HCT 38.0 34.8 - 46.6 %   MCV 77 (L) 81 - 101 fL   MCH 22.8 (L) 26.0 - 34.0 pg   MCHC 29.5 (L) 32.0 - 36.0 g/dL   RDW 15.2 11.1 - 15.7 %   Platelets 306 145 - 400 10e3/uL   NEUT# 2.8 1.5 - 6.5 10e3/uL   LYMPH# 1.9  0.9 - 3.3 10e3/uL   MONO# 0.5 0.1 - 0.9 10e3/uL   Eosinophils Absolute 0.1 0.0 - 0.5 10e3/uL   BASO# 0.0 0.0 - 0.2 10e3/uL   NEUT% 53.2 39.6 - 80.0 %   LYMPH% 35.7 14.0 - 48.0 %   MONO% 9.0 0.0 - 13.0 %   EOS% 1.7 0.0 - 7.0 %   BASO% 0.4 0.0 - 2.0 %  CMP STAT ([facility] only)     Status: Abnormal   Collection Time: 03/03/16 11:01 AM  Result Value Ref Range   Sodium 144 128 - 145 mEq/L   Potassium 4.4 3.3 - 4.7 mEq/L   Chloride 99 98 - 108 mEq/L   CO2 34 (H) 18 - 33 mEq/L   Glucose, Bld 141 (H) 73 - 118 mg/dL   BUN, Bld 11 7 -  22 mg/dL   Creat 0.6 0.6 - 1.2 mg/dl   Total Bilirubin 0.70 0.20 - 1.60 mg/dl   Alkaline Phosphatase 69 26 - 84 U/L   AST 66 (H) 11 - 38 U/L   ALT(SGPT) 33 10 - 47 U/L   Total Protein 7.5 6.4 - 8.1 g/dL   Albumin 4.0 3.3 - 5.5 g/dL   Calcium 10.0 8.0 - 10.3 mg/dL  D-dimer, quantitative (not at [facility])     Status: Abnormal   Collection Time: 03/03/16 11:01 AM  Result Value Ref Range   D-DIMER 0.57 (H) 0.00 - 0.49 mg/L FEU    Comment: According to the assay manufacturer's published package insert, a normal (<0.50 mg/L FEU) D-dimer result in conjunction with a non-high clinical probability assessment, excludes deep vein thrombosis (DVT) and pulmonary embolism (PE) with high sensitivity. D-dimer values increase with age and this can make VTE exclusion of an older population difficult. To address this, the [employer], based on best available evidence and recent guidelines, recommends that clinicians use age-adjusted D-dimer thresholds in patients greater than 26 years of age with: a) a low probability of PE who do not meet all Pulmonary Embolism Rule Out Criteria, or b) in those with intermediate probability of PE. The formula for an age-adjusted D-dimer cut-off is "age/100". For example, a 78 year old patient would have an age-adjusted cut-off of 0.60 mg/L FEU and an 78 year old 0.80 mg/L FEU.     Assessment/Plan: 1.  Right thigh pain Muscular with very small bruise 2/2 hitting leg into table. ROM intact without pain. Only pain with palpation over area of ecchymosis. No concerning findings on exam warranting further workup. Continue pain medication. Heating pad recommended. Supportive measures reviewed. FU prn.

## 2016-03-17 DIAGNOSIS — K7689 Other specified diseases of liver: Secondary | ICD-10-CM | POA: Diagnosis not present

## 2016-03-17 DIAGNOSIS — R7989 Other specified abnormal findings of blood chemistry: Secondary | ICD-10-CM | POA: Diagnosis not present

## 2016-03-17 DIAGNOSIS — N281 Cyst of kidney, acquired: Secondary | ICD-10-CM | POA: Diagnosis not present

## 2016-03-18 ENCOUNTER — Ambulatory Visit (INDEPENDENT_AMBULATORY_CARE_PROVIDER_SITE_OTHER): Payer: Medicare Other | Admitting: Physician Assistant

## 2016-03-18 ENCOUNTER — Encounter: Payer: Self-pay | Admitting: Physician Assistant

## 2016-03-18 ENCOUNTER — Ambulatory Visit: Payer: Medicare Other | Admitting: Adult Health

## 2016-03-18 VITALS — BP 138/72 | HR 74 | Temp 98.1°F | Resp 16 | Ht 65.0 in | Wt 105.1 lb

## 2016-03-18 DIAGNOSIS — Z86718 Personal history of other venous thrombosis and embolism: Secondary | ICD-10-CM | POA: Diagnosis not present

## 2016-03-18 DIAGNOSIS — N200 Calculus of kidney: Secondary | ICD-10-CM | POA: Diagnosis not present

## 2016-03-18 DIAGNOSIS — I82812 Embolism and thrombosis of superficial veins of left lower extremities: Secondary | ICD-10-CM

## 2016-03-18 DIAGNOSIS — N201 Calculus of ureter: Secondary | ICD-10-CM | POA: Diagnosis not present

## 2016-03-18 DIAGNOSIS — R3129 Other microscopic hematuria: Secondary | ICD-10-CM | POA: Diagnosis not present

## 2016-03-18 NOTE — Patient Instructions (Signed)
Please go downstairs to schedule your Ultrasound for tomorrow morning.   Since there is no evidence of a DVt on exam I am comfortable with [place] waiting until the morning as there are no other time slots available for [place] this afternoon.   Again I do not feel a superficial clot on exam but giving your history we will assess further with an [place]. I would like for you to keep leg elevated and continue your pain medication.

## 2016-03-19 ENCOUNTER — Emergency Department (HOSPITAL_BASED_OUTPATIENT_CLINIC_OR_DEPARTMENT_OTHER): Payer: Medicare Other

## 2016-03-19 ENCOUNTER — Ambulatory Visit (HOSPITAL_BASED_OUTPATIENT_CLINIC_OR_DEPARTMENT_OTHER)
Admission: RE | Admit: 2016-03-19 | Discharge: 2016-03-19 | Disposition: A | Discharge status: Home / Self Care | Payer: Medicare Other | Source: Ambulatory Visit | Attending: Physician Assistant | Admitting: Physician Assistant

## 2016-03-19 ENCOUNTER — Other Ambulatory Visit: Payer: Self-pay

## 2016-03-19 ENCOUNTER — Encounter (HOSPITAL_BASED_OUTPATIENT_CLINIC_OR_DEPARTMENT_OTHER): Payer: Self-pay

## 2016-03-19 ENCOUNTER — Observation Stay (HOSPITAL_BASED_OUTPATIENT_CLINIC_OR_DEPARTMENT_OTHER)
Admission: EM | Admit: 2016-03-19 | Discharge: 2016-03-21 | Disposition: A | Discharge status: Home / Self Care | Payer: Medicare Other | Attending: Internal Medicine | Admitting: Internal Medicine

## 2016-03-19 DIAGNOSIS — Z86718 Personal history of other venous thrombosis and embolism: Secondary | ICD-10-CM

## 2016-03-19 DIAGNOSIS — Z87891 Personal history of nicotine dependence: Secondary | ICD-10-CM | POA: Diagnosis not present

## 2016-03-19 DIAGNOSIS — K219 Gastro-esophageal reflux disease without esophagitis: Secondary | ICD-10-CM | POA: Diagnosis not present

## 2016-03-19 DIAGNOSIS — R718 Other abnormality of red blood cells: Secondary | ICD-10-CM | POA: Diagnosis present

## 2016-03-19 DIAGNOSIS — J9621 Acute and chronic respiratory failure with hypoxia: Secondary | ICD-10-CM | POA: Diagnosis present

## 2016-03-19 DIAGNOSIS — F419 Anxiety disorder, unspecified: Secondary | ICD-10-CM | POA: Diagnosis present

## 2016-03-19 DIAGNOSIS — I10 Essential (primary) hypertension: Secondary | ICD-10-CM | POA: Diagnosis not present

## 2016-03-19 DIAGNOSIS — R0602 Shortness of breath: Secondary | ICD-10-CM | POA: Diagnosis not present

## 2016-03-19 DIAGNOSIS — J441 Chronic obstructive pulmonary disease with (acute) exacerbation: Principal | ICD-10-CM | POA: Diagnosis present

## 2016-03-19 DIAGNOSIS — M79651 Pain in right thigh: Secondary | ICD-10-CM | POA: Diagnosis not present

## 2016-03-19 DIAGNOSIS — I82812 Embolism and thrombosis of superficial veins of left lower extremities: Secondary | ICD-10-CM

## 2016-03-19 DIAGNOSIS — J9601 Acute respiratory failure with hypoxia: Secondary | ICD-10-CM | POA: Diagnosis not present

## 2016-03-19 DIAGNOSIS — Z79899 Other long term (current) drug therapy: Secondary | ICD-10-CM | POA: Diagnosis not present

## 2016-03-19 DIAGNOSIS — E0590 Thyrotoxicosis, unspecified without thyrotoxic crisis or storm: Secondary | ICD-10-CM

## 2016-03-19 LAB — TROPONIN I
Troponin I: <0.03 ng/mL (ref <0.031)
Troponin I: <0.03 ng/mL (ref <0.031)

## 2016-03-19 LAB — CBC WITH DIFFERENTIAL/PLATELET
Basophils Absolute: 0 10*3/uL (ref 0.0–0.1)
Basophils Relative: 0 %
EOS ABS: 0.2 10*3/uL (ref 0.0–0.7)
Eosinophils Relative: 3 %
HCT: 36.7 % (ref 36.0–46.0)
HEMOGLOBIN: 10.7 g/dL — AB (ref 12.0–15.0)
LYMPHS ABS: 1.6 10*3/uL (ref 0.7–4.0)
LYMPHS PCT: 27 %
MCH: 22.6 pg — AB (ref 26.0–34.0)
MCHC: 29.2 g/dL — AB (ref 30.0–36.0)
MCV: 77.4 fL — AB (ref 78.0–100.0)
MONOS PCT: 12 %
Monocytes Absolute: 0.7 10*3/uL (ref 0.1–1.0)
NEUTROS PCT: 58 %
Neutro Abs: 3.4 10*3/uL (ref 1.7–7.7)
Platelets: 237 10*3/uL (ref 150–400)
RBC: 4.74 MIL/uL (ref 3.87–5.11)
RDW: 15.6 % — ABNORMAL HIGH (ref 11.5–15.5)
WBC: 5.8 10*3/uL (ref 4.0–10.5)

## 2016-03-19 LAB — BASIC METABOLIC PANEL
Anion gap: 5 (ref 5–15)
BUN: 17 mg/dL (ref 6–20)
CHLORIDE: 97 mmol/L — AB (ref 101–111)
CO2: 41 mmol/L — AB (ref 22–32)
CREATININE: 0.47 mg/dL (ref 0.44–1.00)
Calcium: 9.7 mg/dL (ref 8.9–10.3)
GFR calc Af Amer: >60 mL/min (ref >60)
GFR calc non Af Amer: >60 mL/min (ref >60)
GLUCOSE: 104 mg/dL — AB (ref 65–99)
POTASSIUM: 3.8 mmol/L (ref 3.5–5.1)
Sodium: 143 mmol/L (ref 135–145)

## 2016-03-19 LAB — BRAIN NATRIURETIC PEPTIDE: B Natriuretic Peptide: 35.2 pg/mL (ref 0.0–100.0)

## 2016-03-19 LAB — D-DIMER, QUANTITATIVE (NOT AT ARMC): D DIMER QUANT: 0.44 ug{FEU}/mL (ref 0.00–0.50)

## 2016-03-19 MED ORDER — VERAPAMIL HCL ER 240 MG PO TBCR
480.0000 mg | EXTENDED_RELEASE_TABLET | Freq: Every day | ORAL | Status: DC
  Filled 2016-03-19: qty 2

## 2016-03-19 MED ORDER — ACETAMINOPHEN-CODEINE #3 300-30 MG PO TABS
1.0000 | ORAL_TABLET | Freq: Three times a day (TID) | ORAL | Status: DC | PRN
  Administered 2016-03-20 – 2016-03-21 (×2): 2 via ORAL
  Filled 2016-03-19 (×2): qty 2

## 2016-03-19 MED ORDER — ONDANSETRON HCL 4 MG/2ML IJ SOLN: 4.0000 mg | Freq: Four times a day (QID) | INTRAMUSCULAR | Status: DC | PRN

## 2016-03-19 MED ORDER — ALBUTEROL SULFATE (2.5 MG/3ML) 0.083% IN NEBU
5.0000 mg | INHALATION_SOLUTION | Freq: Once | RESPIRATORY_TRACT | Status: AC
  Administered 2016-03-19: 5 mg via RESPIRATORY_TRACT
  Filled 2016-03-19: qty 6

## 2016-03-19 MED ORDER — VITAMIN D 1000 UNITS PO TABS
1000.0000 [IU] | ORAL_TABLET | Freq: Every day | ORAL | Status: DC
  Administered 2016-03-20 – 2016-03-21 (×2): 1000 [IU] via ORAL
  Filled 2016-03-19 (×2): qty 1

## 2016-03-19 MED ORDER — ONDANSETRON HCL 4 MG PO TABS: 4.0000 mg | ORAL_TABLET | Freq: Four times a day (QID) | ORAL | Status: DC | PRN

## 2016-03-19 MED ORDER — DARIFENACIN HYDROBROMIDE ER 7.5 MG PO TB24
7.5000 mg | ORAL_TABLET | Freq: Every day | ORAL | Status: DC
  Administered 2016-03-20 – 2016-03-21 (×2): 7.5 mg via ORAL
  Filled 2016-03-19 (×2): qty 1

## 2016-03-19 MED ORDER — FERROUS FUMARATE 324 (106 FE) MG PO TABS: ORAL_TABLET | Freq: Every day | ORAL | Status: DC

## 2016-03-19 MED ORDER — POLYETHYLENE GLYCOL 3350 17 G PO PACK: 17.0000 g | PACK | Freq: Every day | ORAL | Status: DC | PRN

## 2016-03-19 MED ORDER — FAMOTIDINE 20 MG PO TABS
40.0000 mg | ORAL_TABLET | Freq: Every day | ORAL | Status: DC
  Administered 2016-03-20 (×2): 40 mg via ORAL
  Filled 2016-03-19 (×2): qty 2

## 2016-03-19 MED ORDER — ENOXAPARIN SODIUM 30 MG/0.3ML ~~LOC~~ SOLN
30.0000 mg | SUBCUTANEOUS | Status: DC
  Administered 2016-03-20: 30 mg via SUBCUTANEOUS
  Filled 2016-03-19: qty 0.3

## 2016-03-19 MED ORDER — ALBUTEROL SULFATE (2.5 MG/3ML) 0.083% IN NEBU
2.5000 mg | INHALATION_SOLUTION | RESPIRATORY_TRACT | Status: DC | PRN
  Administered 2016-03-20: 2.5 mg via RESPIRATORY_TRACT
  Filled 2016-03-19: qty 3

## 2016-03-19 MED ORDER — ALBUTEROL (5 MG/ML) CONTINUOUS INHALATION SOLN
INHALATION_SOLUTION | RESPIRATORY_TRACT | Status: AC
Start: 2016-03-19 — End: 2016-03-19
  Administered 2016-03-19: 3 mg/h
  Filled 2016-03-19: qty 20

## 2016-03-19 MED ORDER — ACETAMINOPHEN 325 MG PO TABS
650.0000 mg | ORAL_TABLET | Freq: Four times a day (QID) | ORAL | Status: DC | PRN
  Filled 2016-03-19: qty 2

## 2016-03-19 MED ORDER — SODIUM CHLORIDE 0.9 % IV SOLN: 250.0000 mL | INTRAVENOUS | Status: DC | PRN

## 2016-03-19 MED ORDER — METHYLPREDNISOLONE SODIUM SUCC 125 MG IJ SOLR
60.0000 mg | Freq: Four times a day (QID) | INTRAMUSCULAR | Status: DC
  Administered 2016-03-20 (×3): 60 mg via INTRAVENOUS
  Filled 2016-03-19 (×3): qty 2

## 2016-03-19 MED ORDER — SODIUM CHLORIDE 0.9% FLUSH: 3.0000 mL | INTRAVENOUS | Status: DC | PRN

## 2016-03-19 MED ORDER — LEVOFLOXACIN IN D5W 750 MG/150ML IV SOLN
750.0000 mg | INTRAVENOUS | Status: DC
  Administered 2016-03-20: 750 mg via INTRAVENOUS
  Filled 2016-03-19 (×2): qty 150

## 2016-03-19 MED ORDER — ACETAMINOPHEN 650 MG RE SUPP: 650.0000 mg | Freq: Four times a day (QID) | RECTAL | Status: DC | PRN

## 2016-03-19 MED ORDER — BUDESONIDE 0.25 MG/2ML IN SUSP
0.2500 mg | Freq: Two times a day (BID) | RESPIRATORY_TRACT | Status: DC
  Administered 2016-03-20 – 2016-03-21 (×3): 0.25 mg via RESPIRATORY_TRACT
  Filled 2016-03-19 (×3): qty 2

## 2016-03-19 MED ORDER — FLUTICASONE FUROATE-VILANTEROL 100-25 MCG/INH IN AEPB
1.0000 | INHALATION_SPRAY | Freq: Every day | RESPIRATORY_TRACT | Status: DC
  Administered 2016-03-20 – 2016-03-21 (×2): 1 via RESPIRATORY_TRACT
  Filled 2016-03-19: qty 28

## 2016-03-19 MED ORDER — SALINE SPRAY 0.65 % NA SOLN
2.0000 | NASAL | Status: DC | PRN
  Filled 2016-03-19: qty 44

## 2016-03-19 MED ORDER — FERROUS FUMARATE 324 (106 FE) MG PO TABS
1.0000 | ORAL_TABLET | Freq: Every day | ORAL | Status: DC
  Administered 2016-03-20 – 2016-03-21 (×2): 106 mg via ORAL
  Filled 2016-03-19 (×2): qty 1

## 2016-03-19 MED ORDER — ADULT MULTIVITAMIN LIQUID CH
15.0000 mL | Freq: Every day | ORAL | Status: DC
  Administered 2016-03-20 – 2016-03-21 (×2): 15 mL via ORAL
  Filled 2016-03-19 (×2): qty 15

## 2016-03-19 MED ORDER — PREDNISONE 50 MG PO TABS
60.0000 mg | ORAL_TABLET | Freq: Once | ORAL | Status: AC
  Administered 2016-03-19: 60 mg via ORAL
  Filled 2016-03-19: qty 1

## 2016-03-19 MED ORDER — SODIUM CHLORIDE 0.9% FLUSH
3.0000 mL | Freq: Two times a day (BID) | INTRAVENOUS | Status: DC
  Administered 2016-03-20 – 2016-03-21 (×4): 3 mL via INTRAVENOUS

## 2016-03-19 MED ORDER — VERAPAMIL HCL ER 240 MG PO TBCR
480.0000 mg | EXTENDED_RELEASE_TABLET | ORAL | Status: DC
  Administered 2016-03-21: 480 mg via ORAL
  Filled 2016-03-19: qty 2

## 2016-03-19 MED ORDER — BISACODYL 5 MG PO TBEC: 5.0000 mg | DELAYED_RELEASE_TABLET | Freq: Every day | ORAL | Status: DC | PRN

## 2016-03-19 MED ORDER — POTASSIUM CHLORIDE CRYS ER 20 MEQ PO TBCR
20.0000 meq | EXTENDED_RELEASE_TABLET | Freq: Every day | ORAL | Status: DC
Start: 2016-03-20 — End: 2016-03-21
  Administered 2016-03-20 – 2016-03-21 (×2): 20 meq via ORAL
  Filled 2016-03-19: qty 1

## 2016-03-19 MED ORDER — VERAPAMIL HCL ER 240 MG PO TBCR
240.0000 mg | EXTENDED_RELEASE_TABLET | ORAL | Status: DC
  Administered 2016-03-20: 240 mg via ORAL
  Filled 2016-03-19: qty 1

## 2016-03-19 NOTE — H&P (Signed)
History and Physical    Holly Page [id number] DOB: 03/17/1936 DOA: 03/19/2016  PCP: Lamar Blinks, MD   Patient coming from: Home   Chief Complaint: Dyspnea   HPI: Holly Page is a 80 y.o. female with medical history significant for COPD with 3 L/m oxygen requirement at baseline, hypertension, GERD, and recurrent DVT previously on Xarelto who presents to the ED with dyspnea. Patient reports that she had been in her usual state of health until approximately one week ago when she noted the insidious development of worsening dyspnea, as well as some worsening in her chronic cough. This remained fairly stable and was adequately managed with her home nebulizer until earlier on the day of her admission when she was at [facility] for right lower extremity venous Doppler and developed severe acute worsening of subjective dyspnea. She was undergoing evaluation for a focal area of swelling and tenderness in her right thigh which was suspected to be a hematoma based on ultrasound. She was then taken the emergency department for evaluation. She denied any associated chest pain or palpitations and denies hemoptysis. Patient also denies recent fevers or chills or sick contacts. There's been no long distance travel or prolonged immobilization. Patient was previously on Xarelto for her unprovoked recurrent DVT, but was taken off this in November 2016.  ED Course: Upon arrival to the [facility] ED, patient is found to be afebrile, saturating adequately on 3 L/m supplemental oxygen, and with vitals otherwise stable. EKG features a nonspecific T-wave abnormality in the anterior leads and troponin was undetectable 2. BNP is 35.2, BMP notable for serum bicarbonate of 41 which appears chronic, and CBC features a stable microcytic anemia with hemoglobin of 10.7. Chest x-ray demonstrates increased interstitial prominence, consistent with edema superimposed on emphysema. Patient's presentation was suspected to be  secondary to acute COPD exacerbation.  She was treated with 60 mg oral prednisone and albuterol nebulized treatments and admission to [facility] for ongoing evaluation and management was arranged.  Patient is interviewed and examined on the medical-surgical unit at [facility], where she is found to be in no apparent respiratory distress and saturating adequately on her usual 3 L/m supplemental oxygen. She will be observed in the hospital overnight for ongoing evaluation and management of acute on chronic dyspnea, most likely representing an acute exacerbation in COPD, but with some remaining concern for possible PE.  Review of Systems:  All other systems reviewed and apart from HPI, are negative.  Past Medical History  Diagnosis Date  . Pneumonia   . COPD (chronic obstructive pulmonary disease) ([place])   . Hypertension   . DVT (deep venous thrombosis) ([place]) 1980s, recurrent 2015    Right DVT 2015  on xarelto  . GERD (gastroesophageal reflux disease)   . Lactose intolerance   . Spinal stenosis   . Meningioma ([place])     followed by Dr Christella Noa    Past Surgical History  Procedure Laterality Date  . Foot surgery    . Cholecystectomy    . Tonsillectomy    . Tubal ligation       reports that she quit smoking about 3 years ago. Her smoking use included Cigarettes. She has a 84 pack-year smoking history. She has never used smokeless tobacco. She reports that she does not drink alcohol or use illicit drugs.  Allergies  Allergen Reactions  . Incruse Ellipta [Umeclidinium Bromide] Shortness Of Breath  . Tramadol Shortness Of Breath  . Amlodipine     Other reaction(s):  SWELLING  . Aspirin     Other reaction(s): PALPITATIONS Other reaction(s): DIFFICULTY BREATHING Heart flutter  . Codeine Other (See Comments)    Hallucinations, can take Tylenol #3 now  . Doxycycline     Other reaction(s): NAUSEA,VOMITING  . Hydrocodone     hallucinations  . Losartan     unknown  .  Propoxyphene Nausea Only    Other reaction(s): NAUSEA  . Gabapentin     Other reaction(s): Other (See Comments) She could not swallow the large pill  . Tiotropium Itching and Rash    Family History  Problem Relation Age of Onset  . Lupus Sister   . COPD Father     smoker deceased.   [name] Heart attack Son      Prior to Admission medications   Medication Sig Start Date End Date Taking? Authorizing Provider  acetaminophen-codeine (TYLENOL #3) 300-30 MG tablet Take 1-2 tablets by mouth every 8 (eight) hours as needed for moderate pain or severe pain. 07/16/15  Yes Debbrah Alar, NP  albuterol (PROVENTIL HFA;VENTOLIN HFA) 108 (90 Base) MCG/ACT inhaler Inhale 2 puffs into the lungs every 6 (six) hours as needed for wheezing or shortness of breath. 02/13/16  Yes Tammy S Parrett, NP  albuterol (PROVENTIL) (2.5 MG/3ML) 0.083% nebulizer solution TAKE 3 ML'S BY NEBULIZATION EVERY 6 HOURS AS NEEDED FOR SHORTNESS OF BREATH AND AS NEEDED 02/08/16  Yes Rigoberto Noel, MD  budesonide (PULMICORT) 0.25 MG/2ML nebulizer solution Take 2 mLs (0.25 mg total) by nebulization 2 (two) times daily. DX: J43.9 11/29/15  Yes Rigoberto Noel, MD  cholecalciferol (VITAMIN D) 1000 UNITS tablet Take 1,000 Units by mouth daily.   Yes Historical Provider, MD  Ferrous Sulfate (IRON SLOW RELEASE) 143 (45 Fe) MG TBCR Take by mouth daily.   Yes Historical Provider, MD  fluticasone furoate-vilanterol (BREO ELLIPTA) 100-25 MCG/INH AEPB Inhale 1 puff into the lungs daily. 01/17/16  Yes Tammy S Parrett, NP  Multiple Vitamins-Minerals (MULTIVITAMIN & MINERAL PO) Take 1 tablet by mouth daily.   Yes Historical Provider, MD  OXYGEN Inhale 3 L/hr into the lungs.   Yes Historical Provider, MD  potassium chloride SA (K-DUR,KLOR-CON) 20 MEQ tablet Take 1 tablet (20 mEq total) by mouth daily. 06/20/15  Yes Debbrah Alar, NP  ranitidine (ZANTAC) 300 MG tablet Take 1 tablet (300 mg total) by mouth at bedtime. 1 tablet at bedtime 04/24/15  04/23/16 Yes Debbrah Alar, NP  sodium chloride (OCEAN) 0.65 % SOLN nasal spray Place 2 sprays into both nostrils as needed for congestion. Reported on 12/31/2015   Yes Historical Provider, MD  solifenacin (VESICARE) 5 MG tablet Take 5 mg by mouth daily.   Yes Historical Provider, MD  verapamil (CALAN-SR) 240 MG CR tablet Take 2 tablets (480 mg total) by mouth daily. Patient taking differently: Take 480 mg by mouth daily. Alternate between [1] daily and [2] daily 01/09/16  Yes Gay Filler Copland, MD  ALPRAZolam Duanne Moron) 0.25 MG tablet Take 1/2 - 1 tablet by mouth daily as needed Patient not taking: Reported on 03/19/2016 11/08/15   Melvenia Needles, NP    Physical Exam: Filed Vitals:   03/19/16 1932 03/19/16 1959 03/19/16 2000 03/19/16 2120  BP:   153/61 173/73  Pulse: 88 74  84  Temp:    98.4 F (36.9 C)  TempSrc:    Oral  Resp: 22 18  21   Height:    5\' 5"  (1.651 m)  Weight:    46.312 kg (102 lb 1.6  oz)  SpO2: 100%  100% 100%      Constitutional: NAD, calm, comfortable. Cachectic  Eyes: PERTLA, lids and conjunctivae normal ENMT: Mucous membranes are moist. Posterior pharynx clear of any exudate or lesions.   Neck: normal, supple, no masses, no thyromegaly Respiratory: Diminished b/l, end-expiratory wheezes b/l, no crackles. Normal respiratory effort.   Cardiovascular: S1 & S2 heard, regular rate and rhythm, no significant murmurs / rubs / gallops. No extremity edema. No significant JVD. Abdomen: No distension, no tenderness, no masses palpated. Bowel sounds normal.  Musculoskeletal: no clubbing / cyanosis. No joint deformity upper and lower extremities. Normal muscle tone.  Skin: no significant rashes, lesions, ulcers. Warm, dry, well-perfused. Neurologic: CN 2-12 grossly intact. Sensation intact, DTR normal. Strength 5/5 in all 4 limbs.  Psychiatric: Normal judgment and insight. Alert and oriented x 3. Normal mood and affect.     Labs on Admission: I have personally reviewed  following labs and imaging studies  CBC:  Recent Labs Lab 03/19/16 1140  WBC 5.8  NEUTROABS 3.4  HGB 10.7*  HCT 36.7  MCV 77.4*  PLT [id number]   Basic Metabolic Panel:  Recent Labs Lab 03/19/16 1140  NA 143  K 3.8  CL 97*  CO2 41*  GLUCOSE 104*  BUN 17  CREATININE 0.47  CALCIUM 9.7   GFR: Estimated Creatinine Clearance: 43 mL/min (by C-G formula based on Cr of 0.47). Liver Function Tests: No results for input(s): AST, ALT, ALKPHOS, BILITOT, PROT, ALBUMIN in the last 168 hours. No results for input(s): LIPASE, AMYLASE in the last 168 hours. No results for input(s): AMMONIA in the last 168 hours. Coagulation Profile: No results for input(s): INR, PROTIME in the last 168 hours. Cardiac Enzymes:  Recent Labs Lab 03/19/16 1140 03/19/16 1445  TROPONINI <0.03 <0.03   BNP (last 3 results) No results for input(s): PROBNP in the last 8760 hours. HbA1C: No results for input(s): HGBA1C in the last 72 hours. CBG: No results for input(s): GLUCAP in the last 168 hours. Lipid Profile: No results for input(s): CHOL, HDL, LDLCALC, TRIG, CHOLHDL, LDLDIRECT in the last 72 hours. Thyroid Function Tests: No results for input(s): TSH, T4TOTAL, FREET4, T3FREE, THYROIDAB in the last 72 hours. Anemia Panel: No results for input(s): VITAMINB12, FOLATE, FERRITIN, TIBC, IRON, RETICCTPCT in the last 72 hours. Urine analysis:    Component Value Date/Time   COLORURINE YELLOW 08/30/2015 2317   APPEARANCEUR CLEAR 08/30/2015 2317   LABSPEC 1.025 08/30/2015 2317   PHURINE 7.5 08/30/2015 2317   GLUCOSEU NEGATIVE 08/30/2015 2317   HGBUR SMALL* 08/30/2015 2317   BILIRUBINUR NEGATIVE 08/30/2015 2317   BILIRUBINUR negative 06/29/2015 1440   KETONESUR 15* 08/30/2015 2317   PROTEINUR NEGATIVE 08/30/2015 2317   PROTEINUR negative 06/29/2015 1440   UROBILINOGEN negative 06/29/2015 1440   UROBILINOGEN 0.2 08/13/2014 2003   NITRITE NEGATIVE 08/30/2015 2317   NITRITE negative 06/29/2015 1440    LEUKOCYTESUR NEGATIVE 08/30/2015 2317   Sepsis Labs: @LABRCNTIP (procalcitonin:4,lacticidven:4) )No results found for this or any previous visit (from the past 240 hour(s)).   Radiological Exams on Admission: US Venous Img Lower Unilateral Right  03/19/2016  CLINICAL DATA:  Right upper leg pain for 1 week with bruising since bumping into a table. History of deep venous thrombosis. Subsequent encounter. EXAM: RIGHT LOWER EXTREMITY VENOUS DOPPLER ULTRASOUND TECHNIQUE: Gray-scale sonography with graded compression, as well as color Doppler and duplex ultrasound were performed to evaluate the lower extremity deep venous systems from the level of the common femoral vein and  including the common femoral, femoral, profunda femoral, popliteal and calf veins including the posterior tibial, peroneal and gastrocnemius veins when visible. The superficial great saphenous vein was also interrogated. Spectral Doppler was utilized to evaluate flow at rest and with distal augmentation maneuvers in the common femoral, femoral and popliteal veins. COMPARISON:  Right lower extremity ultrasound 10/22/2015. FINDINGS: Contralateral Common Femoral Vein: Respiratory phasicity is normal and symmetric with the symptomatic side. No evidence of thrombus. Normal compressibility. Common Femoral Vein: No evidence of thrombus. Normal compressibility, respiratory phasicity and response to augmentation. Saphenofemoral Junction: No evidence of thrombus. Normal compressibility and flow on color Doppler imaging. Profunda Femoral Vein: No evidence of thrombus. Normal compressibility and flow on color Doppler imaging. Femoral Vein: No evidence of thrombus. Normal compressibility, respiratory phasicity and response to augmentation. Popliteal Vein: No evidence of thrombus. Normal compressibility, respiratory phasicity and response to augmentation. Calf Veins: No evidence of thrombus. Normal compressibility and flow on color Doppler imaging.  Superficial Great Saphenous Vein: No evidence of thrombus. Normal compressibility and flow on color Doppler imaging. Venous Reflux:  None. Other Findings: Scanning in the region of concern demonstrates an echogenic area measuring 2.1 x 0.7 x 2.5 cm compatible with hematoma in the subcutaneous fatty tissues. IMPRESSION: No evidence of deep venous thrombosis. Likely hematoma subcutaneous fatty tissues in the region of concern. Electronically Signed   By: Inge Rise M.D.   On: 03/19/2016 11:14   Dg Chest Portable 1 View  03/19/2016  CLINICAL DATA:  Increasing shortness of breath for 2 weeks. History of COPD and hypertension. EXAM: PORTABLE CHEST 1 VIEW COMPARISON:  CT and radiographs 08/30/2015. FINDINGS: 1155 hours. There is mild patient rotation to the right. The heart size and mediastinal contours are stable. The lungs are hyperinflated. There is increased interstitial prominence with a basilar predominance, most consistent with superimposed edema. No confluent airspace opacity, pneumothorax or significant pleural effusion. The bones appear unchanged. IMPRESSION: Increased interstitial prominence suspicious for edema superimposed on emphysema. Findings suggest congestive heart failure. Electronically Signed   By: Richardean Sale M.D.   On: 03/19/2016 12:15    EKG: Independently reviewed. Sinus rhythm, non-specific T-wave abnormality in anterior leads  Assessment/Plan  1. COPD with acute exacerbation - One week of progressive dyspnea and increased cough  - Requires 3 Lpm supplemental O2 at baseline  - CXR suggests possible edema superimposed on emphysema, but no crackles, JVD, or peripheral edema on exam, BNP 35  - Continue albuterol nebs (pt reports tiotropium allergy), Solu-Medrol, Levaquin  - Titrate FiO2 to maintain sats >90%  - Continue Pulmicort BID   - Given history of recurrent, unprovoked DVT, but no other acute features suggestive of PE, she was ruled-out for PE with negative d-dimer     2. Hypertension  - At goal initially, then elevated on the floor  - Managed with verapamil at home, will continue    3. GERD - Stable, managed with scheduled high-dose Zantac at home - Continue equivalent-dose Pepcid    4. Microcytic anemia  - Hgb 10.7 and MCV 77.4 on admission, both indices stable  - No sign of bleeding  - Iron-studies from February are normal; possibly a thalassemia trait - Continue iron-supplementation   5. History of DVT  - History of unprovoked DVT x2, previously on Xarelto until stopped in November 2016  - Aside from dyspnea and increased cough, likely secondary to COPD exacerbation, there are no features suggestive of PE  - Focal swelling and tenderness in right thigh evaluated  with [place] earlier on the day of admission and neg for DVT  - Pre-test probability low, ruled-out for PE with negative d-dimer     DVT prophylaxis: sq Lovenox  Code Status: Full  Family Communication: Discussed with patient  Disposition Plan: Observe on med-surg  Consults called: None Admission status: Observation     Vianne Bulls, MD [facility] Pager [phone]  If 7PM-7AM, please contact night-coverage [email or web address] Password [email or web address]  03/19/2016, 10:45 PM

## 2016-03-19 NOTE — ED Notes (Signed)
Pt was originally here for ultrasound of right leg here at [facility], when she went to get up from test, she was unable to get up from table, as she was walking back to waiting room, pt was extremely short of breath and having difficulty catching her breath, at this time, pt is lying on stretching. Speaking in sentences without difficulty, oxygen in place, pt states she feels better sitting up

## 2016-03-19 NOTE — ED Notes (Addendum)
Patient complains of increasing shortness of breath the past 2 weeks. Arrived on O2 at 3l ...wears oxygen 24/7. Speaking single words on arrival. No chest pain. Has COPD, minimal cough. Alert and oriented.

## 2016-03-19 NOTE — Progress Notes (Signed)
Patient presents to clinic today c/o pain in posterior R thigh, just above popliteal region over the past few days. Denies trauma or injury. Denies swelling of leg, numbness or tingling. Was seen by her Urologist earlier today who mentioned she may have a superficial blood clot in her leg but did not mention anything further. Patient with history of DVT and phlebitis, followed by Hematology. Was taken off of her Xarelto in January. No issue since that time. Denies chest pain or SOB. Denies bruising at the area. Was seen recently for a small bruise of lateral R thigh that has resolved.  Past Medical History  Diagnosis Date  . Pneumonia   . COPD (chronic obstructive pulmonary disease) ([place])   . Hypertension   . DVT (deep venous thrombosis) ([place]) 1980s, recurrent 2015    Right DVT 2015  on xarelto  . GERD (gastroesophageal reflux disease)   . Lactose intolerance   . Spinal stenosis   . Meningioma [facility])     followed by Dr Christella Noa    Current Outpatient Prescriptions on File Prior to Visit  Medication Sig Dispense Refill  . acetaminophen-codeine (TYLENOL #3) 300-30 MG tablet Take 1-2 tablets by mouth every 8 (eight) hours as needed for moderate pain or severe pain. 60 tablet 0  . albuterol (PROVENTIL HFA;VENTOLIN HFA) 108 (90 Base) MCG/ACT inhaler Inhale 2 puffs into the lungs every 6 (six) hours as needed for wheezing or shortness of breath. 1 Inhaler 5  . albuterol (PROVENTIL) (2.5 MG/3ML) 0.083% nebulizer solution TAKE 3 ML'S BY NEBULIZATION EVERY 6 HOURS AS NEEDED FOR SHORTNESS OF BREATH AND AS NEEDED 375 mL 11  . ALPRAZolam (XANAX) 0.25 MG tablet Take 1/2 - 1 tablet by mouth daily as needed 30 tablet 0  . budesonide (PULMICORT) 0.25 MG/2ML nebulizer solution Take 2 mLs (0.25 mg total) by nebulization 2 (two) times daily. DX: J43.9 120 mL 12  . cholecalciferol (VITAMIN D) 1000 UNITS tablet Take 1,000 Units by mouth daily.    . Ferrous Sulfate (IRON SLOW RELEASE) 143 (45 Fe) MG TBCR Take  by mouth daily.    . fluticasone furoate-vilanterol (BREO ELLIPTA) 100-25 MCG/INH AEPB Inhale 1 puff into the lungs daily. 1 each 6  . Multiple Vitamins-Minerals (MULTIVITAMIN & MINERAL PO) Take 1 tablet by mouth daily.    . OXYGEN Inhale 3 L/hr into the lungs.    . potassium chloride SA (K-DUR,KLOR-CON) 20 MEQ tablet Take 1 tablet (20 mEq total) by mouth daily. 30 tablet 5  . ranitidine (ZANTAC) 300 MG tablet Take 1 tablet (300 mg total) by mouth at bedtime. 1 tablet at bedtime 30 tablet 5  . sodium chloride (OCEAN) 0.65 % SOLN nasal spray Place 2 sprays into both nostrils as needed for congestion. Reported on 12/31/2015    . verapamil (CALAN-SR) 240 MG CR tablet Take 2 tablets (480 mg total) by mouth daily. (Patient taking differently: Take 480 mg by mouth daily. Alternate between [1] daily and [2] daily) 60 tablet 5   No current facility-administered medications on file prior to visit.    Allergies  Allergen Reactions  . Incruse Ellipta [Umeclidinium Bromide] Shortness Of Breath  . Tramadol Shortness Of Breath  . Amlodipine     Other reaction(s): SWELLING  . Aspirin     Other reaction(s): PALPITATIONS Other reaction(s): DIFFICULTY BREATHING Heart flutter  . Codeine Other (See Comments)    Hallucinations, can take Tylenol #3 now  . Doxycycline     Other reaction(s): NAUSEA,VOMITING  .  Hydrocodone     hallucinations  . Losartan     unknown  . Propoxyphene Nausea Only    Other reaction(s): NAUSEA  . Gabapentin     Other reaction(s): Other (See Comments) She could not swallow the large pill  . Tiotropium Itching and Rash    Family History  Problem Relation Age of Onset  . Lupus Sister   . COPD Father     smoker deceased.   [name] Heart attack Son     Social History   Social History  . Marital Status: Divorced    Spouse Name: N/A  . Number of Children: 4  . Years of Education: college   Occupational History  . Retired    Social History Main Topics  . Smoking status:  Former Smoker -- 1.50 packs/day for 56 years    Types: Cigarettes    Quit date: 04/29/2012  . Smokeless tobacco: Never Used     Comment: quit smoking 3 years ago  . Alcohol Use: No     Comment: quit drinking beer 2005  . Drug Use: No  . Sexual Activity: Not Asked   Other Topics Concern  . None   Social History Narrative   Patient lives at home alone- divorced, no pets.     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  All other ROS are negative.  BP 138/72 mmHg  Pulse 74  Temp(Src) 98.1 F (36.7 C) (Oral)  Resp 16  Ht 5\' 5"  (1.651 m)  Wt 105 lb 2 oz (47.684 kg)  BMI 17.49 kg/m2  SpO2 92%  Physical Exam  Constitutional: She is oriented to person, place, and time and well-developed, well-nourished, and in no distress.  HENT:  Head: Normocephalic and atraumatic.  Eyes: Conjunctivae are normal.  Cardiovascular: Normal rate, regular rhythm, normal heart sounds and intact distal pulses.   Pulses:      Popliteal pulses are 2+ on the right side, and 2+ on the left side.       Dorsalis pedis pulses are 2+ on the right side, and 2+ on the left side.       Posterior tibial pulses are 2+ on the right side, and 2+ on the left side.  No edema of lower extremities noted. No palpable venous hardening, tenderness or thrombus. There is no erythema or induration of skin noted. There is mild tenderness of posterior thigh with palpation just superior to popliteal region. Negative Homan sign.  Pulmonary/Chest: Effort normal and breath sounds normal. No respiratory distress. She has no wheezes. She has no rales.  Neurological: She is alert and oriented to person, place, and time.  Vitals reviewed.   Recent Results (from the past 2160 hour(s))  TSH     Status: None   Collection  Time: 12/31/15  9:37 AM  Result Value Ref Range   TSH 2.37 0.35 - 4.50 uIU/mL  T3, free     Status: None   Collection Time: 12/31/15  9:37 AM  Result Value Ref Range   T3, Free 4.2 2.3 - 4.2 pg/mL  T4     Status: None   Collection Time: 12/31/15  9:37 AM  Result Value Ref  Range   T4, Total 7.6 4.5 - 12.0 ug/dL  Basic Metabolic Panel (BMET)     Status: Abnormal   Collection Time: 12/31/15  9:37 AM  Result Value Ref Range   Sodium 145 135 - 145 mEq/L   Potassium 3.2 (L) 3.5 - 5.1 mEq/L   Chloride 99 96 - 112 mEq/L   CO2 38 (H) 19 - 32 mEq/L   Glucose, Bld 105 (H) 70 - 99 mg/dL   BUN 10 6 - 23 mg/dL   Creatinine, Ser 0.54 0.40 - 1.20 mg/dL   Calcium 10.2 8.4 - 10.5 mg/dL   GFR 140.58 >60.00 mL/min  Basic metabolic panel     Status: Abnormal   Collection Time: 01/09/16  2:32 PM  Result Value Ref Range   Sodium 142 135 - 145 mEq/L   Potassium 4.4 3.5 - 5.1 mEq/L   Chloride 99 96 - 112 mEq/L   CO2 38 (H) 19 - 32 mEq/L   Glucose, Bld 83 70 - 99 mg/dL   BUN 11 6 - 23 mg/dL   Creatinine, Ser 0.57 0.40 - 1.20 mg/dL   Calcium 9.7 8.4 - 10.5 mg/dL   GFR 132.07 >60.00 mL/min  Urine culture     Status: None   Collection Time: 01/09/16  2:32 PM  Result Value Ref Range   Colony Count 5,000 COLONIES/ML    Organism ID, Bacteria Insignificant Growth   Comprehensive metabolic panel     Status: Abnormal   Collection Time: 01/30/16 11:58 AM  Result Value Ref Range   Sodium 142 135 - 145 mEq/L   Potassium 3.5 3.5 - 5.1 mEq/L   Chloride 98 96 - 112 mEq/L   CO2 37 (H) 19 - 32 mEq/L   Glucose, Bld 90 70 - 99 mg/dL   BUN 11 6 - 23 mg/dL   Creatinine, Ser 0.54 0.40 - 1.20 mg/dL   Total Bilirubin 0.4 0.2 - 1.2 mg/dL   Alkaline Phosphatase 71 39 - 117 U/L   AST 46 (H) 0 - 37 U/L   ALT 19 0 - 35 U/L   Total Protein 7.5 6.0 - 8.3 g/dL   Albumin 4.5 3.5 - 5.2 g/dL   Calcium 10.2 8.4 - 10.5 mg/dL   GFR 140.55 >60.00 mL/min  TSH     Status: None   Collection Time: 01/30/16 11:58 AM  Result  Value Ref Range   TSH 1.33 0.35 - 4.50 uIU/mL  CBC     Status: Abnormal   Collection Time: 01/30/16 11:58 AM  Result Value Ref Range   WBC 5.5 4.0 - 10.5 K/uL   RBC 5.05 3.87 - 5.11 Mil/uL   Platelets 274.0 150.0 - 400.0 K/uL   Hemoglobin 11.4 (L) 12.0 - 15.0 g/dL   HCT 37.1 36.0 - 46.0 %   MCV 73.6 (L) 78.0 - 100.0 fl   MCHC 30.8 30.0 - 36.0 g/dL   RDW 15.3 11.5 - 15.5 %  CBC with Differential [facility])     Status: Abnormal   Collection Time: 03/03/16 11:01 AM  Result Value Ref Range   WBC 5.3 3.9 - 10.0 10e3/uL   RBC 4.91 3.70 - 5.32 10e6/uL   HGB 11.2 (L) 11.6 - 15.9 g/dL   HCT 38.0 34.8 - 46.6 %   MCV 77 (L) 81 - 101 fL   MCH 22.8 (L) 26.0 - 34.0 pg   MCHC 29.5 (L) 32.0 - 36.0 g/dL   RDW 15.2 11.1 - 15.7 %   Platelets 306  145 - 400 10e3/uL   NEUT# 2.8 1.5 - 6.5 10e3/uL   LYMPH# 1.9 0.9 - 3.3 10e3/uL   MONO# 0.5 0.1 - 0.9 10e3/uL   Eosinophils Absolute 0.1 0.0 - 0.5 10e3/uL   BASO# 0.0 0.0 - 0.2 10e3/uL   NEUT% 53.2 39.6 - 80.0 %   LYMPH% 35.7 14.0 - 48.0 %   MONO% 9.0 0.0 - 13.0 %   EOS% 1.7 0.0 - 7.0 %   BASO% 0.4 0.0 - 2.0 %  CMP STAT ([facility] only)     Status: Abnormal   Collection Time: 03/03/16 11:01 AM  Result Value Ref Range   Sodium 144 128 - 145 mEq/L   Potassium 4.4 3.3 - 4.7 mEq/L   Chloride 99 98 - 108 mEq/L   CO2 34 (H) 18 - 33 mEq/L   Glucose, Bld 141 (H) 73 - 118 mg/dL   BUN, Bld 11 7 - 22 mg/dL   Creat 0.6 0.6 - 1.2 mg/dl   Total Bilirubin 0.70 0.20 - 1.60 mg/dl   Alkaline Phosphatase 69 26 - 84 U/L   AST 66 (H) 11 - 38 U/L   ALT(SGPT) 33 10 - 47 U/L   Total Protein 7.5 6.4 - 8.1 g/dL   Albumin 4.0 3.3 - 5.5 g/dL   Calcium 10.0 8.0 - 10.3 mg/dL  D-dimer, quantitative (not at [facility])     Status: Abnormal   Collection Time: 03/03/16 11:01 AM  Result Value Ref Range   D-DIMER 0.57 (H) 0.00 - 0.49 mg/L FEU    Comment: According to the assay manufacturer's published package insert, a normal (<0.50 mg/L FEU) D-dimer result  in conjunction with a non-high clinical probability assessment, excludes deep vein thrombosis (DVT) and pulmonary embolism (PE) with high sensitivity. D-dimer values increase with age and this can make VTE exclusion of an older population difficult. To address this, the [employer], based on best available evidence and recent guidelines, recommends that clinicians use age-adjusted D-dimer thresholds in patients greater than 14 years of age with: a) a low probability of PE who do not meet all Pulmonary Embolism Rule Out Criteria, or b) in those with intermediate probability of PE. The formula for an age-adjusted D-dimer cut-off is "age/100". For example, a 78 year old patient would have an age-adjusted cut-off of 0.60 mg/L FEU and an 78 year old 0.80 mg/L FEU.     Assessment/Plan: 1 Acute superficial venous thrombosis of left lower extremity Reports this diagnosis from Urologist. Notes reviewed with no mention of this issue. Mild tenderness noted without palpable thrombus. No leg swelling, calf tenderness. Negative Homan sign. No alarm signs symptoms. Will order stat DVT to assess for superficial thrombosis and to verify no recurrence of DVT giving history and that patient was taken off of blood thinner. Supportive measures reviewed. Pain medication as directed. Resume compression stocking. Alarm signs/symptoms reviewed that would prompt ER assessment reviewed. She is to call 911 or go to ER if these occur.  - US Venous Img Lower Unilateral Right; Future   Leeanne Rio, PA-C

## 2016-03-19 NOTE — ED Provider Notes (Signed)
  Physical Exam  BP 125/66 mmHg  Pulse 58  Temp(Src) 98.6 F (37 C) (Oral)  Resp 21  Ht 5\' 5"  (1.651 m)  Wt 103 lb (46.72 kg)  BMI 17.14 kg/m2  SpO2 100%  Physical Exam  ED Course  Procedures  MDM Dr Marthenia Rolling called back and accepted patient.      Davonna Belling, MD 03/19/16 [id number]

## 2016-03-19 NOTE — ED Notes (Signed)
Resting SpO2 100% and HR 67, RR 18. Pt up to walk with 3lpm [place] to restroom. Pt walked approx. 15-20 feet SpO2 96% HR 74, however RR increased to 28. Increased WOB noted. Pt states I feel dizzy. Pt placed in wheelchair and returned back to her room. MD made aware.

## 2016-03-19 NOTE — ED Provider Notes (Signed)
CSN: [id number]     Arrival date & time 03/19/16  1108 History   First MD Initiated Contact with Patient 03/19/16 1124     Chief Complaint  Patient presents with  . Shortness of Breath    Patient is a 80 y.o. female presenting with shortness of breath. The history is provided by the patient.  Shortness of Breath Severity:  Moderate Onset quality:  Gradual Duration:  1 week Timing:  Constant Progression:  Worsening Chronicity:  Recurrent Relieved by:  Rest Worsened by:  Activity Associated symptoms: cough and wheezing   Associated symptoms: no abdominal pain, no fever and no vomiting   Associated symptoms comment:  Mild chest tightness  Risk factors: hx of PE/DVT   Risk factors comment:  H/o COPD  Patient with h/o COPD (on 3LNC daily) h/o DVT no longer on therapy, presents with SOB She reports while she was here for DVT imaging she had worsening SOB upon standing She admits to SOB over past week She has no new cough  Past Medical History  Diagnosis Date  . Pneumonia   . COPD (chronic obstructive pulmonary disease) ([place])   . Hypertension   . DVT (deep venous thrombosis) ([place]) 1980s, recurrent 2015    Right DVT 2015  on xarelto  . GERD (gastroesophageal reflux disease)   . Lactose intolerance   . Spinal stenosis   . Meningioma ([place])     followed by Dr Christella Noa   Past Surgical History  Procedure Laterality Date  . Foot surgery    . Cholecystectomy    . Tonsillectomy    . Tubal ligation     Family History  Problem Relation Age of Onset  . Lupus Sister   . COPD Father     smoker deceased.   [name] Heart attack Son    Social History  Substance Use Topics  . Smoking status: Former Smoker -- 1.50 packs/day for 56 years    Types: Cigarettes    Quit date: 04/29/2012  . Smokeless tobacco: Never Used     Comment: quit smoking 3 years ago  . Alcohol Use: No     Comment: quit drinking beer 2005   OB History    No data available     Review of Systems  Constitutional:  Negative for fever.  Respiratory: Positive for cough, shortness of breath and wheezing.   Gastrointestinal: Positive for nausea. Negative for vomiting and abdominal pain.  All other systems reviewed and are negative.     Allergies  Incruse ellipta; Tramadol; Amlodipine; Aspirin; Codeine; Doxycycline; Hydrocodone; Losartan; Propoxyphene; Gabapentin; and Tiotropium  Home Medications   Prior to Admission medications   Medication Sig Start Date End Date Taking? Authorizing Provider  acetaminophen-codeine (TYLENOL #3) 300-30 MG tablet Take 1-2 tablets by mouth every 8 (eight) hours as needed for moderate pain or severe pain. 07/16/15   Debbrah Alar, NP  albuterol (PROVENTIL HFA;VENTOLIN HFA) 108 (90 Base) MCG/ACT inhaler Inhale 2 puffs into the lungs every 6 (six) hours as needed for wheezing or shortness of breath. 02/13/16   Tammy S Parrett, NP  albuterol (PROVENTIL) (2.5 MG/3ML) 0.083% nebulizer solution TAKE 3 ML'S BY NEBULIZATION EVERY 6 HOURS AS NEEDED FOR SHORTNESS OF BREATH AND AS NEEDED 02/08/16   Rigoberto Noel, MD  ALPRAZolam Duanne Moron) 0.25 MG tablet Take 1/2 - 1 tablet by mouth daily as needed 11/08/15   Tammy S Parrett, NP  budesonide (PULMICORT) 0.25 MG/2ML nebulizer solution Take 2 mLs (0.25 mg total) by nebulization 2 (  two) times daily. DX: J43.9 11/29/15   Rigoberto Noel, MD  cholecalciferol (VITAMIN D) 1000 UNITS tablet Take 1,000 Units by mouth daily.    Historical Provider, MD  Ferrous Sulfate (IRON SLOW RELEASE) 143 (45 Fe) MG TBCR Take by mouth daily.    Historical Provider, MD  fluticasone furoate-vilanterol (BREO ELLIPTA) 100-25 MCG/INH AEPB Inhale 1 puff into the lungs daily. 01/17/16   Tammy S Parrett, NP  Multiple Vitamins-Minerals (MULTIVITAMIN & MINERAL PO) Take 1 tablet by mouth daily.    Historical Provider, MD  OXYGEN Inhale 3 L/hr into the lungs.    Historical Provider, MD  potassium chloride SA (K-DUR,KLOR-CON) 20 MEQ tablet Take 1 tablet (20 mEq total) by mouth  daily. 06/20/15   Debbrah Alar, NP  ranitidine (ZANTAC) 300 MG tablet Take 1 tablet (300 mg total) by mouth at bedtime. 1 tablet at bedtime 04/24/15 04/23/16  Debbrah Alar, NP  sodium chloride (OCEAN) 0.65 % SOLN nasal spray Place 2 sprays into both nostrils as needed for congestion. Reported on 12/31/2015    Historical Provider, MD  verapamil (CALAN-SR) 240 MG CR tablet Take 2 tablets (480 mg total) by mouth daily. Patient taking differently: Take 480 mg by mouth daily. Alternate between [1] daily and [2] daily 01/09/16   Gay Filler Copland, MD   BP 125/60 mmHg  Pulse 59  Temp(Src) 98.6 F (37 C) (Oral)  Resp 26  Ht 5\' 5"  (1.651 m)  Wt 46.72 kg  BMI 17.14 kg/m2  SpO2 100% Physical Exam CONSTITUTIONAL: Frail, elderly HEAD: Normocephalic/atraumatic EYES: EOMI/PERRL ENMT: Mucous membranes moist NECK: supple no meningeal signs SPINE/BACK:entire spine nontender CV: S1/S2 noted, no murmurs/rubs/gallops noted LUNGS: mildly tachypneic, wheezing bilaterally, no distress noted ABDOMEN: soft, nontender, no rebound or guarding, bowel sounds noted throughout abdomen GU:no cva tenderness NEURO: Pt is awake/alert/appropriate, moves all extremitiesx4.  No facial droop.   EXTREMITIES: pulses normal/equal, full ROM, no LE edema noted SKIN: warm, color normal PSYCH: no abnormalities of mood noted, alert and oriented to situation  ED Course  Procedures  CRITICAL CARE Performed by: Sharyon Cable Total critical care time: 33 minutes Critical care time was exclusive of separately billable procedures and treating other patients. Critical care was necessary to treat or prevent imminent or life-threatening deterioration. Critical care was time spent personally by me on the following activities: development of treatment plan with patient and/or surrogate as well as nursing, discussions with consultants, evaluation of patient's response to treatment, examination of patient, obtaining history from  patient or surrogate, ordering and performing treatments and interventions, ordering and review of laboratory studies, ordering and review of radiographic studies, pulse oximetry and re-evaluation of patient's condition. PATIENT WITH COPD EXACERBATION REQUIRING MULTIPLE ALBUTEROL NEBULIZED TREATMENTS AND ADMISSION TO HOSPITAL   11:57 AM Pt with h/o COPD here with probable exacerbation She has wheezing She just had DVT study performed and negative in right LE Doubt PE (she has had 3 negative CT chest in past 2 yrs) Will treat COPD and reassess 3:53 PM PT GIVEN MULTIPLE TREATMENTS SHE IS STILL HAVING WHEEZING SHE IS TACHYPNEIC ON EXERTION AND SHE FEELS VERY WEAK ON EXERTION ADVISED ADMISSION FOR CONTINUED TREATMENT OF HER COPD PT AGREEABLE  Labs Review Labs Reviewed  CBC WITH DIFFERENTIAL/PLATELET - Abnormal; Notable for the following:    Hemoglobin 10.7 (*)    MCV 77.4 (*)    MCH 22.6 (*)    MCHC 29.2 (*)    RDW 15.6 (*)    All other components within normal  limits  BASIC METABOLIC PANEL - Abnormal; Notable for the following:    Chloride 97 (*)    CO2 41 (*)    Glucose, Bld 104 (*)    All other components within normal limits  TROPONIN I  BRAIN NATRIURETIC PEPTIDE  TROPONIN I    Imaging Review US Venous Img Lower Unilateral Right  03/19/2016  CLINICAL DATA:  Right upper leg pain for 1 week with bruising since bumping into a table. History of deep venous thrombosis. Subsequent encounter. EXAM: RIGHT LOWER EXTREMITY VENOUS DOPPLER ULTRASOUND TECHNIQUE: Gray-scale sonography with graded compression, as well as color Doppler and duplex ultrasound were performed to evaluate the lower extremity deep venous systems from the level of the common femoral vein and including the common femoral, femoral, profunda femoral, popliteal and calf veins including the posterior tibial, peroneal and gastrocnemius veins when visible. The superficial great saphenous vein was also interrogated. Spectral  Doppler was utilized to evaluate flow at rest and with distal augmentation maneuvers in the common femoral, femoral and popliteal veins. COMPARISON:  Right lower extremity ultrasound 10/22/2015. FINDINGS: Contralateral Common Femoral Vein: Respiratory phasicity is normal and symmetric with the symptomatic side. No evidence of thrombus. Normal compressibility. Common Femoral Vein: No evidence of thrombus. Normal compressibility, respiratory phasicity and response to augmentation. Saphenofemoral Junction: No evidence of thrombus. Normal compressibility and flow on color Doppler imaging. Profunda Femoral Vein: No evidence of thrombus. Normal compressibility and flow on color Doppler imaging. Femoral Vein: No evidence of thrombus. Normal compressibility, respiratory phasicity and response to augmentation. Popliteal Vein: No evidence of thrombus. Normal compressibility, respiratory phasicity and response to augmentation. Calf Veins: No evidence of thrombus. Normal compressibility and flow on color Doppler imaging. Superficial Great Saphenous Vein: No evidence of thrombus. Normal compressibility and flow on color Doppler imaging. Venous Reflux:  None. Other Findings: Scanning in the region of concern demonstrates an echogenic area measuring 2.1 x 0.7 x 2.5 cm compatible with hematoma in the subcutaneous fatty tissues. IMPRESSION: No evidence of deep venous thrombosis. Likely hematoma subcutaneous fatty tissues in the region of concern. Electronically Signed   By: Inge Rise M.D.   On: 03/19/2016 11:14   I have personally reviewed and evaluated these images and lab results as part of my medical decision-making.   EKG Interpretation   Date/Time:  Wednesday March 19 2016 13:57:51 EDT Ventricular Rate:  59 PR Interval:  200 QRS Duration: 91 QT Interval:  440 QTC Calculation: 436 R Axis:   84 Text Interpretation:  Sinus rhythm Borderline right axis deviation  Borderline T wave abnormalities Confirmed by  Christy Gentles  MD, Darlene Bartelt ([id number])  on 03/19/2016 2:36:45 PM     Medications  albuterol (PROVENTIL) (2.5 MG/3ML) 0.083% nebulizer solution 5 mg (5 mg Nebulization Given 03/19/16 1211)  predniSONE (DELTASONE) tablet 60 mg (60 mg Oral Given 03/19/16 1154)  albuterol (PROVENTIL) (2.5 MG/3ML) 0.083% nebulizer solution 5 mg (5 mg Nebulization Given 03/19/16 1348)  albuterol (PROVENTIL, VENTOLIN) (5 MG/ML) 0.5% continuous inhalation solution (3 mg/hr  Given 03/19/16 1431)    MDM   Final diagnoses:  Chronic obstructive pulmonary disease with acute exacerbation (HCC)  Acute respiratory failure with hypoxia [facility])    Nursing notes including past medical history and social history reviewed and considered in documentation xrays/imaging reviewed by myself and considered during evaluation Labs/vital reviewed myself and considered during evaluation Previous records reviewed and considered     Ripley Fraise, MD 03/19/16 1610

## 2016-03-19 NOTE — ED Notes (Signed)
Patient reports feeling as though her breathing his improved some, like she is "finally able to get a little air in."

## 2016-03-20 ENCOUNTER — Other Ambulatory Visit: Payer: Self-pay | Admitting: *Deleted

## 2016-03-20 DIAGNOSIS — F419 Anxiety disorder, unspecified: Secondary | ICD-10-CM | POA: Diagnosis not present

## 2016-03-20 DIAGNOSIS — J9621 Acute and chronic respiratory failure with hypoxia: Secondary | ICD-10-CM | POA: Diagnosis not present

## 2016-03-20 DIAGNOSIS — J441 Chronic obstructive pulmonary disease with (acute) exacerbation: Secondary | ICD-10-CM

## 2016-03-20 LAB — T4, FREE: FREE T4: 0.75 ng/dL (ref 0.61–1.12)

## 2016-03-20 LAB — BASIC METABOLIC PANEL
ANION GAP: 6 (ref 5–15)
BUN: 8 mg/dL (ref 6–20)
CHLORIDE: 97 mmol/L — AB (ref 101–111)
CO2: 38 mmol/L — AB (ref 22–32)
CREATININE: 0.34 mg/dL — AB (ref 0.44–1.00)
Calcium: 9.4 mg/dL (ref 8.9–10.3)
GFR calc non Af Amer: >60 mL/min (ref >60)
Glucose, Bld: 113 mg/dL — ABNORMAL HIGH (ref 65–99)
Potassium: 4.1 mmol/L (ref 3.5–5.1)
Sodium: 141 mmol/L (ref 135–145)

## 2016-03-20 LAB — TROPONIN I
Troponin I: <0.03 ng/mL (ref <0.031)
Troponin I: <0.03 ng/mL (ref <0.031)

## 2016-03-20 LAB — GLUCOSE, CAPILLARY: Glucose-Capillary: 133 mg/dL — ABNORMAL HIGH (ref 65–99)

## 2016-03-20 LAB — TSH: TSH: 0.321 u[IU]/mL — ABNORMAL LOW (ref 0.350–4.500)

## 2016-03-20 MED ORDER — METHYLPREDNISOLONE SODIUM SUCC 125 MG IJ SOLR
60.0000 mg | Freq: Two times a day (BID) | INTRAMUSCULAR | Status: DC
  Administered 2016-03-20 – 2016-03-21 (×2): 60 mg via INTRAVENOUS
  Filled 2016-03-20 (×2): qty 2

## 2016-03-20 MED ORDER — BOOST / RESOURCE BREEZE PO LIQD
1.0000 | Freq: Three times a day (TID) | ORAL | Status: DC
  Administered 2016-03-20 – 2016-03-21 (×2): 1 via ORAL

## 2016-03-20 MED ORDER — ALBUTEROL SULFATE (2.5 MG/3ML) 0.083% IN NEBU
2.5000 mg | INHALATION_SOLUTION | Freq: Four times a day (QID) | RESPIRATORY_TRACT | Status: DC
Start: 2016-03-21 — End: 2016-03-21
  Administered 2016-03-21 (×2): 2.5 mg via RESPIRATORY_TRACT
  Filled 2016-03-20 (×2): qty 3

## 2016-03-20 MED ORDER — HYDRALAZINE HCL 20 MG/ML IJ SOLN: 10.0000 mg | INTRAMUSCULAR | Status: DC | PRN

## 2016-03-20 MED ORDER — ALBUTEROL SULFATE (2.5 MG/3ML) 0.083% IN NEBU
2.5000 mg | INHALATION_SOLUTION | Freq: Four times a day (QID) | RESPIRATORY_TRACT | Status: DC
  Administered 2016-03-20 (×3): 2.5 mg via RESPIRATORY_TRACT
  Filled 2016-03-20 (×3): qty 3

## 2016-03-20 NOTE — Progress Notes (Signed)
[facility] Progress Note  Patient: Holly [name] [id number]   PCP: Lamar Blinks, MD DOB: [birth year]   DOA: 03/19/2016   DOS: 03/20/2016   Date of Service: the patient was seen and examined on 03/20/2016  Subjective: Patient mentions that despite pulse oximetry showing 98% oxygenation on 3 L she continues to have shortness of breath. No chest pain. No abdominal pain. She complains about pain at a particular spot on the right hip. No nausea no vomiting or diarrhea. Nutrition: Tolerating oral diet  Brief hospital course: Pt. with PMH of COPD, chronic respiratory failure; admitted on 03/19/2016, with complaint of shortness of breath, was found to have mild COPD exacerbation. Currently further plan is continue steroids.  Assessment and Plan: 1. COPD with exacerbation ([place]) Continue IV steroids reducing from every 6 hours to every 12 hours. Continue IV Levaquin. Continue monitoring culture. Continue DuoNeb's.  2. GERD. Continue PPI.  3. Hypertension  - At goal initially, then elevated on the floor  - Managed with verapamil at home, will continue   4. Microcytic anemia  - Hgb 10.7 and MCV 77.4 on admission, both indices stable  - No sign of bleeding  - Iron-studies from February are normal; possibly a thalassemia trait - Continue iron-supplementation   5. History of DVT  - History of unprovoked DVT x2, previously on Xarelto until stopped in November 2016  - Aside from dyspnea and increased cough, likely secondary to COPD exacerbation, there are no features suggestive of PE  - Focal swelling and tenderness in right thigh evaluated with [place] earlier on the day of admission and neg for DVT  - Pre-test probability low, ruled-out for PE with negative d-dimer   6. Low TSH. Patient is a normal free T4 less likely hyperthyroidism we will continue to closely monitor.  Pain management: When necessary Tylenol Activity: Consulted physical therapy Bowel regimen: last  BM prior to admission Diet: Cardiac diet DVT Prophylaxis: subcutaneous Heparin  Advance goals of care discussion: Full code  Family Communication: NO family was present at bedside, at the time of interview.   Disposition:  Discharge to home. Expected discharge date: 03/21/2016, improvement in symptoms  Consultants: None Procedures: None  Antibiotics: Anti-infectives    Start     Dose/Rate Route Frequency Ordered Stop   03/19/16 2300  levofloxacin (LEVAQUIN) IVPB 750 mg     750 mg 100 mL/hr over 90 Minutes Intravenous Every 48 hours 03/19/16 2247          Intake/Output Summary (Last 24 hours) at 03/20/16 1807 Last data filed at 03/20/16 1500  Gross per 24 hour  Intake   1230 ml  Output   1200 ml  Net     30 ml   Filed Weights   03/19/16 1114 03/19/16 2120  Weight: 46.72 kg (103 lb) 46.312 kg (102 lb 1.6 oz)    Objective: Physical Exam: Filed Vitals:   03/20/16 1110 03/20/16 1436 03/20/16 1631 03/20/16 1733  BP:   144/73   Pulse:  79 77   Temp:   98.2 F (36.8 C)   TempSrc:   Oral   Resp:  20 20   Height:      Weight:      SpO2: 100% 98% 100% 99%    General: Alert, Awake and Oriented to Time, Place and Person. Appear in mild distress Eyes: PERRL, Conjunctiva normal ENT: Oral Mucosa clear moist. Neck: NO JVD, NO Abnormal Mass Or lumps Cardiovascular: S1 and S2 Present, NO Murmur, Respiratory: Bilateral Air  entry equal and Decreased, NO Crackles, BILATERAL wheezes Abdomen: Bowel Sound PREsent, Soft and NO tenderness Skin: NO redness, NON Rash  Extremities: NO Pedal edema, NO calf tenderness Neurologic: Grossly no focal neuro deficit. Bilaterally Equal motor strength  Data Reviewed: CBC:  Recent Labs Lab 03/19/16 1140  WBC 5.8  NEUTROABS 3.4  HGB 10.7*  HCT 36.7  MCV 77.4*  PLT 123XX123   Basic Metabolic Panel:  Recent Labs Lab 03/19/16 1140 03/20/16 0424  NA 143 141  K 3.8 4.1  CL 97* 97*  CO2 41* 38*  GLUCOSE 104* 113*  BUN 17 8    CREATININE 0.47 0.34*  CALCIUM 9.7 9.4    Liver Function Tests: No results for input(s): AST, ALT, ALKPHOS, BILITOT, PROT, ALBUMIN in the last 168 hours. No results for input(s): LIPASE, AMYLASE in the last 168 hours. No results for input(s): AMMONIA in the last 168 hours. Coagulation Profile: No results for input(s): INR, PROTIME in the last 168 hours. Cardiac Enzymes:  Recent Labs Lab 03/19/16 1140 03/19/16 1445 03/19/16 2326 03/20/16 0424  TROPONINI <0.03 <0.03 <0.03 <0.03   BNP (last 3 results) No results for input(s): PROBNP in the last 8760 hours.  CBG:  Recent Labs Lab 03/20/16 0747  GLUCAP 133*    Studies: No results found.   Scheduled Meds: . albuterol  2.5 mg Nebulization Q6H  . budesonide  0.25 mg Nebulization BID  . cholecalciferol  1,000 Units Oral Daily  . darifenacin  7.5 mg Oral Daily  . enoxaparin (LOVENOX) injection  30 mg Subcutaneous Q24H  . famotidine  40 mg Oral QHS  . feeding supplement  1 Container Oral TID BM  . Ferrous Fumarate  1 tablet Oral Daily  . fluticasone furoate-vilanterol  1 puff Inhalation Daily  . levofloxacin (LEVAQUIN) IV  750 mg Intravenous Q48H  . methylPREDNISolone (SOLU-MEDROL) injection  60 mg Intravenous Q12H  . multivitamin  15 mL Oral Daily  . potassium chloride SA  20 mEq Oral Daily  . sodium chloride flush  3 mL Intravenous Q12H  . verapamil  240 mg Oral QODAY  . [START ON 03/21/2016] verapamil  480 mg Oral QODAY   Continuous Infusions:  PRN Meds: sodium chloride, acetaminophen **OR** acetaminophen, acetaminophen-codeine, albuterol, bisacodyl, hydrALAZINE, ondansetron **OR** ondansetron (ZOFRAN) IV, polyethylene glycol, sodium chloride, sodium chloride flush  Time spent: 30 minutes  Author: Berle Mull, MD [facility] Pager: [phone] 03/20/2016 6:07 PM  If 7PM-7AM, please contact night-coverage at [email or web address], password [facility]

## 2016-03-20 NOTE — Evaluation (Signed)
Physical Therapy Evaluation Patient Details Name: Holly [name] MRN: [id number] DOB: Dec 25, 1935 Today's Date: 03/20/2016   History of Present Illness  pt was admitted with dyspnea.  She has COPD and uses 3 liters of 02 at baseline.  H/O DVT, PE ruled out via D-dimer  Clinical Impression  Pt admitted with above diagnosis. Pt currently with functional limitations due to the deficits listed below (see PT Problem List). Pt will benefit from skilled PT to increase their independence and safety with mobility to allow discharge to the venue listed below.  Pt with tendency to hold onto items during gait due to leg soreness not balance.  Will try cane next session. o2 100% on 3 L/min with gait and 2-3/4 dyspnea.     Follow Up Recommendations No PT follow up    Equipment Recommendations  Cane    Recommendations for Other Services       Precautions / Restrictions Precautions Precautions: None Precaution Comments: monitor o2, no falls in last 6 months Restrictions Weight Bearing Restrictions: No      Mobility  Bed Mobility Overal bed mobility: Independent                Transfers Overall transfer level: Independent                  Ambulation/Gait Ambulation/Gait assistance: Supervision;Min guard Ambulation Distance (Feet): 200 Feet Assistive device: None Gait Pattern/deviations: Step-through pattern;Drifts right/left     General Gait Details: Amb without AD with S to occasional min/guard. Tending to reach for rails > 50% during gait. Amb on 3 L/min and o2 100% with 2-3/4 dyspnea by end of gait.  Stairs            Wheelchair Mobility    Modified Rankin (Stroke Patients Only)       Balance Overall balance assessment: Needs assistance   Sitting balance-Leahy Scale: Good       Standing balance-Leahy Scale: Good Standing balance comment: Pt able to lean forward and reach out of BOS with good weight shift in standing                             Pertinent Vitals/Pain Pain Assessment: Faces Faces Pain Scale: Hurts little more Pain Location: c/o leg pain (chronic) and overall chronic body pain Pain Descriptors / Indicators: Sore Pain Intervention(s): Monitored during session;Repositioned    Home Living Family/patient expects to be discharged to:: Private residence Living Arrangements: Alone Available Help at Discharge: Family;Available PRN/intermittently Type of Home: House Home Access: Stairs to enter   [employer] of Steps: 2 Home Layout: One level Home Equipment: None Additional Comments: oxygen at home    Prior Function Level of Independence: Independent               Hand Dominance        Extremity/Trunk Assessment   Upper Extremity Assessment: Defer to OT evaluation           Lower Extremity Assessment: Overall WFL for tasks assessed         Communication   Communication: No difficulties  Cognition Arousal/Alertness: Awake/alert Behavior During Therapy: WFL for tasks assessed/performed Overall Cognitive Status: Within Functional Limits for tasks assessed                      General Comments      Exercises        Assessment/Plan    PT  Assessment Patient needs continued PT services  PT Diagnosis Difficulty walking   PT Problem List Decreased activity tolerance;Cardiopulmonary status limiting activity;Decreased mobility  PT Treatment Interventions DME instruction;Gait training;Stair training;Functional mobility training;Therapeutic activities;Therapeutic exercise;Balance training   PT Goals (Current goals can be found in the Care Plan section) Acute Rehab PT Goals Patient Stated Goal: figure out why she is SOB and her o2 level is high PT Goal Formulation: With patient Time For Goal Achievement: 03/24/16 Potential to Achieve Goals: Good    Frequency Min 3X/week   Barriers to discharge        Co-evaluation               End of Session  Equipment Utilized During Treatment: Gait belt;Oxygen Activity Tolerance: Patient limited by fatigue;Patient tolerated treatment well Patient left: in chair;with call bell/phone within reach Nurse Communication: Mobility status    Functional Assessment Tool Used: clinical judgement and objective findings Functional Limitation: Mobility: Walking and moving around Mobility: Walking and Moving Around Current Status [id number]): At least 1 percent but less than 20 percent impaired, limited or restricted Mobility: Walking and Moving Around Goal Status [phone]): 0 percent impaired, limited or restricted    Time: 1040-1055 PT Time Calculation (min) (ACUTE ONLY): 15 min   Charges:   PT Evaluation $PT Eval Low Complexity: 1 Procedure     PT G Codes:   PT G-Codes **NOT FOR INPATIENT CLASS** Functional Assessment Tool Used: clinical judgement and objective findings Functional Limitation: Mobility: Walking and moving around Mobility: Walking and Moving Around Current Status [id number]): At least 1 percent but less than 20 percent impaired, limited or restricted Mobility: Walking and Moving Around Goal Status [phone]): 0 percent impaired, limited or restricted    Eevie Lapp LUBECK 03/20/2016, 11:19 AM

## 2016-03-20 NOTE — Care Management Obs Status (Addendum)
[place] NOTIFICATION   Patient Details  Name: Holly [name] MRN: [id number] Date of Birth: [birth year]   Medicare Observation Status Notification Given:  Yes  Pt concerned that she will be d/c too soon, and cause of her hospitalization will not be identified. This CM encouraged pt to have a conversation with her MD.   Adron Bene, RN 03/20/2016, 11:53 AM

## 2016-03-20 NOTE — Consult Note (Signed)
   [facility] CM Inpatient Consult   03/20/2016  EMMALEI ERTZ November 04, 1935 [id number]   [place] Management referral received from inpatient [facility] for COPD management. Discussed with inpatient RNCM prior to visiting [name]. Went to bedside to discuss and offer [place] Management services. Patient is agreeable and written consent signed. Explained to patient that she will receive post hospital transition of care calls and will be evaluated for monthly home visits. Confirmed Primary Care MD as Dr. Wylene Simmer.  Confirmed best contact number as [phone]. Left [facility] Care Management packet and contact information at bedside. Will make inpatient RNCM aware that patient will be followed by [place] Management post hospital discharge. [name] denies having issues with transportation to MD appointments. She states she is able to afford her medications most times. She goes to [facility] on [address] in [place]. [name] states she does not need a [place] consult at this time. Right now she thinks she will be fine obtaining her medications "as long as they are not too expensive". Discussed that writer will request for [facility] RNCM to assess need for [facility] Pharmacist to contact her if needed. She is agreeable to this.   Will request for her to be assigned to [place] for COPD disease and symptom management.    Marthenia Rolling, MSN-Ed, RN,BSN [facility] Liaison [phone]

## 2016-03-20 NOTE — Evaluation (Signed)
Occupational Therapy Evaluation [name] Details Name: Holly [name] MRN: [id number] DOB: [birth year] Today's Date: 03/20/2016    History of Present Illness pt was admitted with dyspnea.  She has COPD and uses 3 liters of 02 at baseline.  H/O DVT, PE ruled out via D-dimer   Clinical Impression   This 80 year old female was admitted for the above. She will benefit from continued OT in acute setting to restore independent to mod I level with adls.  Pt is currently set up/supervision and needs reinforcement for energy conservation strategies.    Follow Up Recommendations  Home health OT    Equipment Recommendations  None recommended by OT    Recommendations for Other Services       Precautions / Restrictions Precautions Precautions: Fall Restrictions Weight Bearing Restrictions: No      Mobility Bed Mobility Overal bed mobility: Independent                Transfers Overall transfer level: Independent                    Balance Overall balance assessment: No apparent balance deficits (not formally assessed)                                          ADL Overall ADL's : Needs assistance/impaired     Grooming: Set up;Standing;Supervision/safety   Upper Body Bathing: Set up;Supervision/ safety;Sitting   Lower Body Bathing: Set up;Supervison/ safety;Sit to/from stand   Upper Body Dressing : Set up;Supervision/safety;Sitting   Lower Body Dressing: Set up;Supervision/safety;Sit to/from stand   Toilet Transfer: Modified Independent;Ambulation;Regular Toilet   Toileting- Clothing Manipulation and Hygiene: Independent;Sitting/lateral lean         General ADL Comments: pt is able to perform adls with set up.  She had 3/4 dyspnea with activity and 2/4 at baseline.  Sats 97-99% on 4 liters of 02. Encouraged pursed lip breathing and rest breaks.  Gave energy conservation handout and reviewed with her.  Pt sits at bottom of tub:  hates  showers and is not willing to change this     Vision     Perception     Praxis      Pertinent Vitals/Pain Pain Assessment: No/denies pain     Hand Dominance     Extremity/Trunk Assessment Upper Extremity Assessment Upper Extremity Assessment: Overall WFL for tasks assessed           Communication Communication Communication: No difficulties   Cognition Arousal/Alertness: Awake/alert Behavior During Therapy: WFL for tasks assessed/performed Overall Cognitive Status: Within Functional Limits for tasks assessed                     General Comments       Exercises       Shoulder Instructions      Home Living Family/[name] expects to be discharged to:: Private residence Living Arrangements: Alone                 Bathroom Shower/Tub: Tub/shower unit Shower/tub characteristics: Architectural technologist: Standard     Home Equipment: None   Additional Comments: sits at bottom of tub      Prior Functioning/Environment Level of Independence: Independent (extra time for some tasks)             OT Diagnosis: Generalized weakness   OT Problem List: Decreased strength;Decreased  activity tolerance;Cardiopulmonary status limiting activity;Decreased knowledge of use of DME or AE   OT Treatment/Interventions: Self-care/ADL training;Energy conservation;DME and/or AE instruction;[name]/family education    OT Goals(Current goals can be found in the care plan section) Acute Rehab OT Goals [name] Stated Goal: find out why I am bruising so easily and so SOB OT Goal Formulation: With [name] Time For Goal Achievement: 03/27/16 Potential to Achieve Goals: Good ADL Goals Pt Will Transfer to Toilet: Independently;ambulating;regular height toilet Additional ADL Goal #1: pt will gather clothes at independent level, and complete ADL independently, incorporating rest breaks for energy conservation without cues  OT Frequency: Min 2X/week   Barriers to D/C:             Co-evaluation              End of Session Nurse Communication:  (extern for chair alarm)  Activity Tolerance: [name] tolerated treatment well (with rest breaks) [name] left: in chair;with call bell/phone within reach   Time: 0857-0920 OT Time Calculation (min): 23 min Charges:  OT General Charges $OT Visit: 1 Procedure OT Evaluation $OT Eval Moderate Complexity: 1 Procedure G-Codes: OT G-codes **NOT FOR INPATIENT CLASS** Functional Assessment Tool Used: clinical observation and judgment Functional Limitation: Self care Self Care Current Status [id number]): At least 1 percent but less than 20 percent impaired, limited or restricted Self Care Goal Status [id number]): 0 percent impaired, limited or restricted  Holly [name] 03/20/2016, 9:35 AM Holly [name], OTR/L [phone] 03/20/2016

## 2016-03-20 NOTE — Progress Notes (Addendum)
Initial Nutrition Assessment  DOCUMENTATION CODES:   Severe malnutrition in context of chronic illness  INTERVENTION:  -Boost Breeze po TID, each supplement provides 250 kcal and 9 grams of protein -RD to continue to monitor  NUTRITION DIAGNOSIS:   Malnutrition related to chronic illness as evidenced by percent weight loss, moderate depletion of body fat, moderate depletions of muscle mass.  GOAL:   Patient will meet greater than or equal to 90% of their needs  MONITOR:   PO intake, Supplement acceptance, Labs, I & O's, Skin  REASON FOR ASSESSMENT:   Consult COPD Protocol  ASSESSMENT:   Holly [name] is a 80 y.o. female with medical history significant for COPD with 3 L/m oxygen requirement at baseline, hypertension, GERD, and recurrent DVT previously on Xarelto who presents to the ED with dyspnea. Patient reports that she had been in her usual state of health until approximately one week ago when she noted the insidious development of worsening dyspnea, as well as some worsening in her chronic cough.  Spoke with [name] at bedside. She endorses good appetite at home, but gradual unintentional wt loss. She currently exhibits a 7#/6.4% severe wt loss in 2.5 months Pt was frustrated, states that she eats ice cream in addition to her 3 meals per day to try and maintain weight. She also mentions labored breathing regardless of O2 sats when using 3L [place] "When I get up and walk to the bathroom I'm out of breath."  Patient in general, is frustrated with her health "I want some answers." States she wakes up in pain and doesn't sleep because she has to use the bathroom all night.  Pt likely has increased energy needs because of COPD and COPD exacerbations -> causing her weight loss.  She had a tray during my visit that she consumed 50% of.   Nutrition-Focused physical exam completed. Findings are moderate fat depletion, moderate muscle depletion, and no edema.   Was  agreeable to trying Ensure but is allergic to an ingredient in it.  Diet Order:  Diet regular Room service appropriate?: Yes; Fluid consistency:: Thin  Skin:  Reviewed, no issues  Last BM:  PTA  Height:   Ht Readings from Last 1 Encounters:  03/19/16 5\' 5"  (1.651 m)    Weight:   Wt Readings from Last 1 Encounters:  03/19/16 102 lb 1.6 oz (46.312 kg)    Ideal Body Weight:  56.81 kg  BMI:  Body mass index is 16.99 kg/(m^2).  Estimated Nutritional Needs:   Kcal:  1400-1600 calories  Protein:  50-60 grams  Fluid:  >/= 1.2 L  EDUCATION NEEDS:   No education needs identified at this time  Satira Anis. Trason Shifflet, MS, RD LDN Inpatient Clinical Dietitian Pager [phone]

## 2016-03-20 NOTE — Care Management Note (Addendum)
Case Management Note  Patient Details  Name: ALICIYA SURRATT MRN: [id number] Date of Birth: 1936/09/03  Subjective/Objective:      CM following for progression and d/c planning.               Action/Plan: 03/20/2016 Noted consult for CM re COPD gold, this pt has DME, home oxygen from [facility], pulmonologist is Dr Dagmar Hait. Pt states that her children assist with picking up medications , assisting with shopping or assisting with other needs in the home as needed. No other needs identified at this time. Pt lives alone and is independent of ADLs.  [facility] referral made, no further HH or DME needs at this time.   Expected Discharge Date:    03/20/2016              Expected Discharge Plan:  [place]  In-House Referral:  NA  Discharge planning Services  CM Consult  Post Acute Care Choice:  Home Health Choice offered to:  Patient  DME Arranged:  N/A DME Agency:     HH Arranged:    [place] Agency:     Status of Service:  In process, will continue to follow  If discussed at Long Length of Stay Meetings, dates discussed:    Additional Comments:  Adron Bene, RN 03/20/2016, 10:40 AM

## 2016-03-21 DIAGNOSIS — J9621 Acute and chronic respiratory failure with hypoxia: Secondary | ICD-10-CM | POA: Diagnosis not present

## 2016-03-21 DIAGNOSIS — J441 Chronic obstructive pulmonary disease with (acute) exacerbation: Secondary | ICD-10-CM | POA: Diagnosis not present

## 2016-03-21 DIAGNOSIS — F419 Anxiety disorder, unspecified: Secondary | ICD-10-CM | POA: Diagnosis not present

## 2016-03-21 LAB — GLUCOSE, CAPILLARY: GLUCOSE-CAPILLARY: 123 mg/dL — AB (ref 65–99)

## 2016-03-21 MED ORDER — ENOXAPARIN SODIUM 40 MG/0.4ML ~~LOC~~ SOLN: 40.0000 mg | SUBCUTANEOUS | Status: DC

## 2016-03-21 MED ORDER — BOOST / RESOURCE BREEZE PO LIQD: 1.0000 | Freq: Three times a day (TID) | ORAL | Status: DC

## 2016-03-21 MED ORDER — PREDNISONE 10 MG PO TABS: 10.0000 mg | ORAL_TABLET | Freq: Every day | ORAL | Status: DC

## 2016-03-21 MED ORDER — LEVOFLOXACIN 750 MG PO TABS
750.0000 mg | ORAL_TABLET | ORAL | Status: AC
Start: 2016-03-22 — End: 2016-03-24

## 2016-03-21 NOTE — Progress Notes (Signed)
Occupational Therapy Treatment Patient Details Name: Holly Page MRN: [id number] DOB: Feb 08, 1936 Today's Date: 03/21/2016    History of present illness pt was admitted with dyspnea.  She has COPD and uses 3 liters of 02 at baseline.  H/O DVT, PE ruled out via D-dimer   OT comments  Pt supervision with adls; cues for rest breaks and pursed lip breathing.  Independent with toilet transfer.  Pt had R sided spasms during session.  Sats 100% on 3 liters but WOB and dyspnea increased (3/4) during adls  Follow Up Recommendations  No OT follow up    Equipment Recommendations  None recommended by OT    Recommendations for Other Services      Precautions / Restrictions Precautions Precautions: None Precaution Comments: monitor o2, no falls in last 6 months Restrictions Weight Bearing Restrictions: No       Mobility Bed Mobility Overal bed mobility: Independent                Transfers Overall transfer level: Independent                    Balance                                   ADL                           Toilet Transfer: Ambulation;Regular Toilet;Independent             General ADL Comments: pt was able to gather ADL supplies independently and performed ADL at supervision level--cues for rest breaks and re-educated on energy conservation.  Reviewed energy conservation.  Pt able to retrieve item that felt to floor. Good safety awareness but needs cues to rest and perform pursed lip breathing.  Pt has concerns about getting meals for herself. She hates to ask for help      Vision                     Perception     Praxis      Cognition   Behavior During Therapy: [facility] for tasks assessed/performed Overall Cognitive Status: Within Functional Limits for tasks assessed                       Extremity/Trunk Assessment               Exercises     Shoulder Instructions       General Comments       Pertinent Vitals/ Pain       Pain Assessment: Faces Faces Pain Scale: Hurts whole lot Pain Location: r side Pain Descriptors / Indicators: Spasm Pain Intervention(s): Limited activity within patient's tolerance;Monitored during session;Premedicated before session;Patient requesting pain meds-RN notified;Repositioned  Home Living                                          Prior Functioning/Environment              Frequency       Progress Toward Goals  OT Goals(current goals can now be found in the care plan section)  Progress towards OT goals: Progressing toward goals (met toileting)     Plan Discharge plan needs to be  updated    Co-evaluation                 End of Session     Activity Tolerance Patient tolerated treatment well   Patient Left in bed;with call bell/phone within reach;with bed alarm set   Nurse Communication          Time: [phone] OT Time Calculation (min): 28 min  Charges: OT General Charges $OT Visit: 1 Procedure OT Treatments $Self Care/Home Management : 23-37 mins  Remon Quinto 03/21/2016, 8:22 AM Lesle Chris, OTR/L [phone] 03/21/2016

## 2016-03-21 NOTE — Progress Notes (Signed)
Physical Therapy Treatment Patient Details Name: Holly Page MRN: [id number] DOB: [birth year] Today's Date: 03/21/2016    History of Present Illness pt was admitted with dyspnea.  She has COPD and uses 3 liters of 02 at baseline.  H/O DVT, PE ruled out via D-dimer    PT Comments    Pt moving well with cane despite 2/4 dyspnea and c/o R sided spasms.  Pt steadier with cane and verbalized that it helped her feel steadier and helped with her leg pain. Pt doesn't think she will be able to use it though due to having to wear o2 at home. Discussed options of wearing bag, but pt unsure.  Changing d/c recommendation to HHPT to allow more stair training and to follow up with home o2 set up and how she can manage it and cane.  Do not think she would need much, just some education within her own environment.  Follow Up Recommendations  Home health PT     Equipment Recommendations  Cane    Recommendations for Other Services       Precautions / Restrictions Precautions Precautions: None Precaution Comments: monitor o2, no falls in last 6 months Restrictions Weight Bearing Restrictions: No    Mobility  Bed Mobility Overal bed mobility: Independent             General bed mobility comments: sitting EOB upon arrival  Transfers Overall transfer level: Independent Equipment used: Straight cane                Ambulation/Gait Ambulation/Gait assistance: Supervision Ambulation Distance (Feet): 260 Feet Assistive device: Straight cane Gait Pattern/deviations: Step-through pattern;Drifts right/left Gait velocity: decreased   General Gait Details: Amb with cane today and steadier and pt states she likes it but doesn't think it will work when she goes home because of having to carry o2 tank. 2/4 dyspnea on 3 L/min o2. Attempted to have pt increase gait speed and other aspects of DGI, but pt easily distracted and unable to focus on task.   Stairs Stairs: Yes Stairs  assistance: Min guard Stair Management: No rails;Step to pattern Number of Stairs: 3 General stair comments: performed without cane due to pt stating she doesn't think she will be able to use cane at home.  Reaching for rails, but with cues did not use UE support.  Wheelchair Mobility    Modified Rankin (Stroke Patients Only)       Balance     Sitting balance-Leahy Scale: Good       Standing balance-Leahy Scale: Good Standing balance comment: Pt able to pick up item off floor Single Leg Stance - Right Leg: 6 Single Leg Stance - Left Leg: 4                Cognition Arousal/Alertness: Awake/alert Behavior During Therapy: WFL for tasks assessed/performed Overall Cognitive Status: Within Functional Limits for tasks assessed                      Exercises      General Comments General comments (skin integrity, edema, etc.): Pt focused on getting her bandage changed at IV site      Pertinent Vitals/Pain Pain Assessment: Faces Faces Pain Scale: Hurts even more Pain Location: R sided spasm Pain Descriptors / Indicators: Spasm Pain Intervention(s): Monitored during session;Limited activity within patient's tolerance;Repositioned    Home Living  Prior Function            PT Goals (current goals can now be found in the care plan section) Acute Rehab PT Goals PT Goal Formulation: With patient Time For Goal Achievement: 03/24/16 Potential to Achieve Goals: Good Progress towards PT goals: Progressing toward goals    Frequency  Min 3X/week    PT Plan Discharge plan needs to be updated    Co-evaluation             End of Session Equipment Utilized During Treatment: Gait belt;Oxygen Activity Tolerance: Patient tolerated treatment well;Patient limited by fatigue Patient left: in bed;with bed alarm set;with call bell/phone within reach     Time: 0905-0922 PT Time Calculation (min) (ACUTE ONLY): 17 min  Charges:   $Gait Training: 8-22 mins                    G Codes:      Greysyn Vanderberg LUBECK 03/21/2016, 9:48 AM

## 2016-03-21 NOTE — Progress Notes (Signed)
Pt refusing bed alarm on. Educated pt on fall risk and she verbalized understanding. Pt states she will call if needed.

## 2016-03-21 NOTE — Progress Notes (Signed)
Pharmacy Antibiotic Note  Holly Page is a 80 y.o. female admitted on 03/19/2016 with increasing SOB. Pharmacy has been consulted to start Levaquin for COPD exacerbation coverage.  The patient has already been started on Levaquin at a dose of 750 mg IV every 24 hours. SCr 0.34, CrCl~35-45 ml/min - dose remains appropriate.   Plan: 1. Continue Levaquin 750 mg IV every 48 hours 2. Will continue to follow renal function, culture results, LOT, and antibiotic de-escalation plans   Height: 5\' 5"  (165.1 cm) Weight: 107 lb 11.2 oz (48.852 kg) IBW/kg (Calculated) : 57  Temp (24hrs), Avg:98.3 F (36.8 C), Min:97.7 F (36.5 C), Max:98.9 F (37.2 C)   Recent Labs Lab 03/19/16 1140 03/20/16 0424  WBC 5.8  --   CREATININE 0.47 0.34*    Estimated Creatinine Clearance: 45.5 mL/min (by C-G formula based on Cr of 0.34).    Allergies  Allergen Reactions  . Incruse Ellipta [Umeclidinium Bromide] Shortness Of Breath  . Tramadol Shortness Of Breath  . Amlodipine     Other reaction(s): SWELLING  . Aspirin     Other reaction(s): PALPITATIONS Other reaction(s): DIFFICULTY BREATHING Heart flutter  . Codeine Other (See Comments)    Hallucinations, can take Tylenol #3 now  . Doxycycline     Other reaction(s): NAUSEA,VOMITING  . Hydrocodone     hallucinations  . Losartan     unknown  . Propoxyphene Nausea Only    Other reaction(s): NAUSEA  . Gabapentin     Other reaction(s): Other (See Comments) She could not swallow the large pill  . Tiotropium Itching and Rash    Antimicrobials this admission: LVQ 6/22 >>  Dose adjustments this admission: n/a  Microbiology results: n/a  Thank you for allowing pharmacy to be a part of this patient's care.  Alycia Rossetti, PharmD, BCPS Clinical Pharmacist Pager: [phone] 03/21/2016 1:20 PM

## 2016-03-23 NOTE — Discharge Summary (Signed)
[facility] Discharge Summary   Patient: Holly Page [id number]   PCP: Lamar Blinks, MD DOB: 07-Jul-1936   Date of admission: 03/19/2016   Date of discharge: 03/21/2016    Discharge Diagnoses:  Principal Problem:   COPD with exacerbation ([name]) Active Problems:   GERD (gastroesophageal reflux disease)   History of DVT (deep vein thrombosis)   HTN (hypertension)   Acute on chronic respiratory failure with hypoxia (HCC)   Hyperthyroidism   Anxiety   RBC microcytosis   COPD with acute exacerbation (HCC)   Chronic obstructive pulmonary disease with acute exacerbation ([place])  Admitted From: Home Disposition:  Home with home health  Recommendations for Outpatient Follow-up:  1. Please follow-up with PCP in one week. 2. Please continue with scheduled follow-up with pulmonary   Follow-up Information    Follow up with COPLAND,JESSICA, MD. Schedule an appointment as soon as possible for a visit in 1 week.   Specialty:  Family Medicine   Contact information:   [address] [place] [address] [phone]       Follow up with Rexene Edison, NP. Go on 04/10/2016.   Specialty:  Pulmonary Disease   Contact information:   [address] [place] [address] [phone]      Diet recommendation: Cardiac diet  Activity: The patient is advised to gradually reintroduce usual activities.  Discharge Condition: good  Code Status: Full code  History of present illness: As per the H and P dictated on admission, "Holly Page is a 80 y.o. female with medical history significant for COPD with 3 L/m oxygen requirement at baseline, hypertension, GERD, and recurrent DVT previously on Xarelto who presents to the ED with dyspnea. Patient reports that she had been in her usual state of health until approximately one week ago when she noted the insidious development of worsening dyspnea, as well as some worsening in her chronic cough. This remained  fairly stable and was adequately managed with her home nebulizer until earlier on the day of her admission when she was at [facility] for right lower extremity venous Doppler and developed severe acute worsening of subjective dyspnea. She was undergoing evaluation for a focal area of swelling and tenderness in her right thigh which was suspected to be a hematoma based on ultrasound. She was then taken the emergency department for evaluation. She denied any associated chest pain or palpitations and denies hemoptysis. Patient also denies recent fevers or chills or sick contacts. There's been no long distance travel or prolonged immobilization. Patient was previously on Xarelto for her unprovoked recurrent DVT, but was taken off this in November 2016."  Hospital Course:  Summary of her active problems in the hospital is as following. 1. COPD with exacerbation ([place]) Treated with IV steroids. We will discharge with oral prednisone taper as well as Levaquin to finish 5 day treatment course. Negative culture so far. Continue nebulizers when necessary as well as inhalers at home. Patient will need a follow-up with pulmonary.  2. GERD. Continue H2 blocker.  3. Hypertension  - At goal initially, then elevated on the floor  - Managed with verapamil at home, will continue   4. Microcytic anemia  - Hgb 10.7 and MCV 77.4 on admission, both indices stable  - No sign of bleeding  - Iron-studies from February are normal; possibly a thalassemia trait - Continue iron-supplementation   5. History of DVT  - History of unprovoked DVT x2, previously on Xarelto until stopped in November  2016  - Aside from dyspnea and increased cough, likely secondary to COPD exacerbation, there are no features suggestive of PE  - Focal swelling and tenderness in right thigh evaluated with [place] earlier on the day of admission and neg for DVT  - Pre-test probability low, ruled-out for PE with negative d-dimer   6. Low  TSH. Patient is a normal free T4 less likely hyperthyroidism we will continue to closely monitor.  All other chronic medical condition were stable during the hospitalization.  Patient was seen by physical therapy, who recommended no outpatient therapy requirement On the day of the discharge the patient's oxygenation was stable on her home 3 L of O2 both at rest as well as on exertion, and no other acute medical condition were reported by patient. the patient was felt safe to be discharge at home with family.  Procedures and Results:  None   Consultations:  None  DISCHARGE MEDICATION: Discharge Medication List as of 03/21/2016  2:13 PM    START taking these medications   Details  feeding supplement (BOOST / RESOURCE BREEZE) LIQD Take 1 Container by mouth 3 (three) times daily between meals., Starting 03/21/2016, Until Discontinued, Normal    levofloxacin (LEVAQUIN) 750 MG tablet Take 1 tablet (750 mg total) by mouth every other day., Starting 03/22/2016, Until Mon 03/24/16, Normal    predniSONE (DELTASONE) 10 MG tablet Take 1 tablet (10 mg total) by mouth daily with breakfast. Take 50mg  daily for 3days,Take 40mg  daily for 3days,Take 30mg  daily for 3days,Take 20mg  daily for 3days,Take 10mg  daily for 3days, then stop, Starting 03/21/2016, Until Discontinued, Print      CONTINUE these medications which have NOT CHANGED   Details  acetaminophen-codeine (TYLENOL #3) 300-30 MG tablet Take 1-2 tablets by mouth every 8 (eight) hours as needed for moderate pain or severe pain., Starting 07/16/2015, Until Discontinued, Print    albuterol (PROVENTIL HFA;VENTOLIN HFA) 108 (90 Base) MCG/ACT inhaler Inhale 2 puffs into the lungs every 6 (six) hours as needed for wheezing or shortness of breath., Starting 02/13/2016, Until Discontinued, Normal    albuterol (PROVENTIL) (2.5 MG/3ML) 0.083% nebulizer solution TAKE 3 ML'S BY NEBULIZATION EVERY 6 HOURS AS NEEDED FOR SHORTNESS OF BREATH AND AS NEEDED, Normal     budesonide (PULMICORT) 0.25 MG/2ML nebulizer solution Take 2 mLs (0.25 mg total) by nebulization 2 (two) times daily. DX: J43.9, Starting 11/29/2015, Until Discontinued, Print    cholecalciferol (VITAMIN D) 1000 UNITS tablet Take 1,000 Units by mouth daily., Until Discontinued, Historical Med    Ferrous Sulfate (IRON SLOW RELEASE) 143 (45 Fe) MG TBCR Take by mouth daily., Until Discontinued, Historical Med    fluticasone furoate-vilanterol (BREO ELLIPTA) 100-25 MCG/INH AEPB Inhale 1 puff into the lungs daily., Starting 01/17/2016, Until Discontinued, Normal    Multiple Vitamins-Minerals (MULTIVITAMIN & MINERAL PO) Take 1 tablet by mouth daily., Until Discontinued, Historical Med    OXYGEN Inhale 3 L/hr into the lungs., Until Discontinued, Historical Med    potassium chloride SA (K-DUR,KLOR-CON) 20 MEQ tablet Take 1 tablet (20 mEq total) by mouth daily., Starting 06/20/2015, Until Discontinued, Normal    ranitidine (ZANTAC) 300 MG tablet Take 1 tablet (300 mg total) by mouth at bedtime. 1 tablet at bedtime, Starting 04/24/2015, Until Wed 04/23/16, Normal    sodium chloride (OCEAN) 0.65 % SOLN nasal spray Place 2 sprays into both nostrils as needed for congestion. Reported on 12/31/2015, Until Discontinued, Historical Med    solifenacin (VESICARE) 5 MG tablet Take 5 mg by mouth  daily., Until Discontinued, Historical Med    verapamil (CALAN-SR) 240 MG CR tablet Take 2 tablets (480 mg total) by mouth daily., Starting 01/09/2016, Until Discontinued, Normal    ALPRAZolam (XANAX) 0.25 MG tablet Take 1/2 - 1 tablet by mouth daily as needed, Print       Allergies  Allergen Reactions  . Incruse Ellipta [Umeclidinium Bromide] Shortness Of Breath  . Tramadol Shortness Of Breath  . Amlodipine     Other reaction(s): SWELLING  . Aspirin     Other reaction(s): PALPITATIONS Other reaction(s): DIFFICULTY BREATHING Heart flutter  . Codeine Other (See Comments)    Hallucinations, can take Tylenol #3 now   . Doxycycline     Other reaction(s): NAUSEA,VOMITING  . Hydrocodone     hallucinations  . Losartan     unknown  . Propoxyphene Nausea Only    Other reaction(s): NAUSEA  . Gabapentin     Other reaction(s): Other (See Comments) She could not swallow the large pill  . Tiotropium Itching and Rash   Discharge Instructions    AMB Referral to [facility] Management    Complete by:  As directed   Please assign to [facility] for COPD disease and symptom management. Please assess need for [place] referral for affording medications upon toc. Right now patient denies need for [place] consult. Written consent signed. Please call with questions. Thanks. Marthenia Rolling, [place], RN,BSN-[facility]N [facility] Liaison-[phone]  Reason for consult:  Please assign to [facility] RNCM  Diagnoses of:  COPD/ Pneumonia  Expected date of contact:  1-3 days (reserved for hospital discharges)          Discharge Exam: Filed Weights   03/19/16 1114 03/19/16 2120 03/20/16 2017  Weight: 46.72 kg (103 lb) 46.312 kg (102 lb 1.6 oz) 48.852 kg (107 lb 11.2 oz)   Filed Vitals:   03/21/16 0416 03/21/16 0934  BP: 159/90 138/75  Pulse: 84 92  Temp: 97.7 F (36.5 C) 98.2 F (36.8 C)  Resp: 17 18   General: Appear in mild distress, no Rash; Oral Mucosa moist. Cardiovascular: S1 and S2 Present, no Murmur, no JVD Respiratory: Bilateral Air entry present and Clear to Auscultation, no Crackles, no wheezes Abdomen: Bowel Sound present, Soft and no tenderness Extremities: no Pedal edema, no calf tenderness Neurology: Grossly no focal neuro deficit.  The results of significant diagnostics from this hospitalization (including imaging, microbiology, ancillary and laboratory) are listed below for reference.    Significant Diagnostic Studies: US Venous Img Lower Unilateral Right  03/19/2016  CLINICAL DATA:  Right upper leg pain for 1 week with bruising since bumping into a table. History of deep venous thrombosis.  Subsequent encounter. EXAM: RIGHT LOWER EXTREMITY VENOUS DOPPLER ULTRASOUND TECHNIQUE: Gray-scale sonography with graded compression, as well as color Doppler and duplex ultrasound were performed to evaluate the lower extremity deep venous systems from the level of the common femoral vein and including the common femoral, femoral, profunda femoral, popliteal and calf veins including the posterior tibial, peroneal and gastrocnemius veins when visible. The superficial great saphenous vein was also interrogated. Spectral Doppler was utilized to evaluate flow at rest and with distal augmentation maneuvers in the common femoral, femoral and popliteal veins. COMPARISON:  Right lower extremity ultrasound 10/22/2015. FINDINGS: Contralateral Common Femoral Vein: Respiratory phasicity is normal and symmetric with the symptomatic side. No evidence of thrombus. Normal compressibility. Common Femoral Vein: No evidence of thrombus. Normal compressibility, respiratory phasicity and response to augmentation. Saphenofemoral Junction: No evidence of  thrombus. Normal compressibility and flow on color Doppler imaging. Profunda Femoral Vein: No evidence of thrombus. Normal compressibility and flow on color Doppler imaging. Femoral Vein: No evidence of thrombus. Normal compressibility, respiratory phasicity and response to augmentation. Popliteal Vein: No evidence of thrombus. Normal compressibility, respiratory phasicity and response to augmentation. Calf Veins: No evidence of thrombus. Normal compressibility and flow on color Doppler imaging. Superficial Great Saphenous Vein: No evidence of thrombus. Normal compressibility and flow on color Doppler imaging. Venous Reflux:  None. Other Findings: Scanning in the region of concern demonstrates an echogenic area measuring 2.1 x 0.7 x 2.5 cm compatible with hematoma in the subcutaneous fatty tissues. IMPRESSION: No evidence of deep venous thrombosis. Likely hematoma subcutaneous fatty  tissues in the region of concern. Electronically Signed   By: Inge Rise M.D.   On: 03/19/2016 11:14   Dg Chest Portable 1 View  03/19/2016  CLINICAL DATA:  Increasing shortness of breath for 2 weeks. History of COPD and hypertension. EXAM: PORTABLE CHEST 1 VIEW COMPARISON:  CT and radiographs 08/30/2015. FINDINGS: 1155 hours. There is mild patient rotation to the right. The heart size and mediastinal contours are stable. The lungs are hyperinflated. There is increased interstitial prominence with a basilar predominance, most consistent with superimposed edema. No confluent airspace opacity, pneumothorax or significant pleural effusion. The bones appear unchanged. IMPRESSION: Increased interstitial prominence suspicious for edema superimposed on emphysema. Findings suggest congestive heart failure. Electronically Signed   By: Richardean Sale M.D.   On: 03/19/2016 12:15    Microbiology: No results found for this or any previous visit (from the past 240 hour(s)).   Labs: CBC:  Recent Labs Lab 03/19/16 1140  WBC 5.8  NEUTROABS 3.4  HGB 10.7*  HCT 36.7  MCV 77.4*  PLT [id number]   Basic Metabolic Panel:  Recent Labs Lab 03/19/16 1140 03/20/16 0424  NA 143 141  K 3.8 4.1  CL 97* 97*  CO2 41* 38*  GLUCOSE 104* 113*  BUN 17 8  CREATININE 0.47 0.34*  CALCIUM 9.7 9.4   Liver Function Tests: No results for input(s): AST, ALT, ALKPHOS, BILITOT, PROT, ALBUMIN in the last 168 hours. No results for input(s): LIPASE, AMYLASE in the last 168 hours. No results for input(s): AMMONIA in the last 168 hours. Cardiac Enzymes:  Recent Labs Lab 03/19/16 1140 03/19/16 1445 03/19/16 2326 03/20/16 0424  TROPONINI <0.03 <0.03 <0.03 <0.03   BNP (last 3 results)  Recent Labs  04/12/15 0853 08/30/15 1700 03/19/16 1140  BNP 24.5 44.8 35.2   CBG:  Recent Labs Lab 03/20/16 0747 03/21/16 0727  GLUCAP 133* 123*   Time spent: 30 minutes  Signed:  PATEL, PRANAV  [facility] 03/21/2016 , 9:56 PM

## 2016-03-24 ENCOUNTER — Other Ambulatory Visit: Payer: Self-pay | Admitting: *Deleted

## 2016-03-24 ENCOUNTER — Telehealth: Payer: Self-pay | Admitting: Behavioral Health

## 2016-03-24 DIAGNOSIS — J441 Chronic obstructive pulmonary disease with (acute) exacerbation: Secondary | ICD-10-CM

## 2016-03-24 NOTE — Patient Outreach (Signed)
[facility]) Care Management  03/24/2016  Holly Page 12/15/1935 [id number]  RN spoke with pt today and introduced the [facility] program and services. Pt very receptive an interested in enrolling into the [facility] program and services. RN discussed COPD and generated and plan of care and discussed with pt the COPD action plan and the GREEN zone and the importance of daily monitoring and what to do if acute symptoms should occur. Also discussed increasing her knowledge based related to COPD and adherence to her medications and pending medical appointments post-op discharge. Pt verbalized an understanding and will follow up with her provider based upon the call received from her primary doctor to confirm a follow up appointment. Pt needed to end the call however interested in a home visit but did not have time to discuss date and time. RN offered to follow up later this week to schedule a home visit for further education on the COPD action plan (pt receptive).  Patient was recently discharged from hospital and all medications have been reviewed.  [name], RN Care Management Coordinator [place] Office [phone]

## 2016-03-24 NOTE — Telephone Encounter (Signed)
Attempted to reach patient for TCM/Hospital Follow-up call. Left message for patient to return call when available.    

## 2016-03-25 ENCOUNTER — Encounter: Payer: Self-pay | Admitting: *Deleted

## 2016-03-25 NOTE — Addendum Note (Signed)
Addended by: Kathlen Brunswick on: 03/25/2016 09:45 AM   Modules accepted: Medications

## 2016-03-25 NOTE — Telephone Encounter (Signed)
Transition Care Management Follow-up Telephone Call  Patient: Holly Page    PCP: Lamar Blinks, MD Date of discharge: 03/21/2016  Date of admission: 03/19/2016   Discharge Diagnoses:  Principal Problem:  COPD with exacerbation ([place]) Active Problems:  GERD (gastroesophageal reflux disease)  History of DVT (deep vein thrombosis)  HTN (hypertension)  Acute on chronic respiratory failure with hypoxia (HCC)  Hyperthyroidism  Anxiety  RBC microcytosis  COPD with acute exacerbation (H[facility])  Chronic obstructive pulmonary disease with acute exacerbation ([place])  Admitted From: Home Disposition: Home with home health  Recommendations for Outpatient Follow-up:  1. Please follow-up with PCP in one week. 2. Please continue with scheduled follow-up with pulmonary   How have you been since you were released from the hospital? Patient stated, "I'm not doing exactly good, but better than before I went into the hospital".   Do you understand why you were in the hospital? yes   Do you understand the discharge instructions? yes   Where were you discharged to? Home   Items Reviewed:  Medications reviewed: yes  Allergies reviewed: yes  Dietary changes reviewed: yes, patient reported no changes in diet.  Referrals reviewed: per the patient, no new referrals were made prior to discharge from the hospital.   Functional Questionnaire:   Activities of Daily Living (ADLs):   She states they are independent in the following: ambulation, bathing and hygiene, feeding, continence, grooming, toileting and dressing; patient reported independent with ADLs, but some limitations due to current health condition. States they require assistance with the following: None   Any transportation issues/concerns?: no   Any patient concerns? yes, patient voiced that she's worried about her health overall and the right side pain is still an issue. Also, it concerns her that for  the last 6-7 months, she's continuing to sweat a lot with an odor.   Confirmed importance and date/time of follow-up visits scheduled yes, 03/26/16 at 11:30 AM.  Provider Appointment booked with Dr. Lorelei Pont.  Confirmed with patient if condition begins to worsen call PCP or go to the ER.  Patient was given the office number and encouraged to call back with question or concerns.  : yes

## 2016-03-26 ENCOUNTER — Encounter: Payer: Self-pay | Admitting: *Deleted

## 2016-03-26 ENCOUNTER — Ambulatory Visit (INDEPENDENT_AMBULATORY_CARE_PROVIDER_SITE_OTHER): Payer: Medicare Other | Admitting: Family Medicine

## 2016-03-26 ENCOUNTER — Ambulatory Visit (HOSPITAL_BASED_OUTPATIENT_CLINIC_OR_DEPARTMENT_OTHER)
Admission: RE | Admit: 2016-03-26 | Discharge: 2016-03-26 | Disposition: A | Discharge status: Home / Self Care | Payer: Medicare Other | Source: Ambulatory Visit | Attending: Family Medicine | Admitting: Family Medicine

## 2016-03-26 ENCOUNTER — Encounter: Payer: Self-pay | Admitting: Family Medicine

## 2016-03-26 VITALS — BP 140/60 | HR 73 | Temp 98.2°F | Ht 65.0 in

## 2016-03-26 DIAGNOSIS — J439 Emphysema, unspecified: Secondary | ICD-10-CM | POA: Insufficient documentation

## 2016-03-26 DIAGNOSIS — R1030 Lower abdominal pain, unspecified: Secondary | ICD-10-CM

## 2016-03-26 DIAGNOSIS — R54 Age-related physical debility: Secondary | ICD-10-CM | POA: Diagnosis not present

## 2016-03-26 DIAGNOSIS — R634 Abnormal weight loss: Secondary | ICD-10-CM

## 2016-03-26 DIAGNOSIS — J449 Chronic obstructive pulmonary disease, unspecified: Secondary | ICD-10-CM

## 2016-03-26 NOTE — Progress Notes (Addendum)
[place] [facility] [address] [place], [place] [address] [phone]  Date:  03/26/2016   Name:  Holly Page   DOB:  11-13-1935   MRN:  [id number]  PCP:  Lamar Blinks, MD    Chief Complaint: Hospitalization Follow-up   History of Present Illness:  Holly Page is a 80 y.o. very pleasant female patient who presents with the following:  Here today for a hospital follow-up. She was inpt from 6/21 to 6/23 with COPD exacerbation.  She was treated with IV steroids and discharged home with PO prednisone taper and levaqin for 5 days total.   She states that her progress has "been slow" since she got home and she is not yet back to her baseline.  She has one LQ and a few more days of prednisone left.    She feels like her breathing is "still bad," she is still using 3L of oxygen.  This used to be plenty for her but now does not seem to be enough.  She has a lot of trouble walking more than about 15- 20 feet and is using a WC today  She states that she is having "aching at the bottom" of her abdomen and right upper quadrant pain.  This pain has been present for several months.  She cannot give a specific time frame or pattern of the pain.  States that sometimes it can be severe and wake her up at night.  No recent imaging of her abdomen  She is s/p cholecystectomy.   She was noted to have microcytic anemia but normal ferritin not long ago She is also in the process of microhematuria eval with urology.  She is concerned that her follow-up appt is not until [place]  [facility].    SpO2 Readings from Last 3 Encounters:  03/26/16 92%  03/21/16 99%  03/18/16 92%   Wt Readings from Last 3 Encounters:  03/20/16 107 lb 11.2 oz (48.852 kg)  03/18/16 105 lb 2 oz (47.684 kg)  03/12/16 103 lb 8 oz (46.947 kg)     Patient Active Problem List   Diagnosis Date Noted  . Chronic obstructive pulmonary  disease with acute exacerbation ([place])   . COPD with exacerbation ([place]) 03/19/2016  . RBC microcytosis 03/19/2016  . COPD with acute exacerbation ([place]) 03/19/2016  . Oxygen dependent 12/31/2015  . Anxiety 11/08/2015  . Hyperthyroidism 09/07/2015  . Malnutrition of moderate degree 08/31/2015  . Acute on chronic respiratory failure with hypoxia ([place]) 04/19/2015  . Abnormal LFTs 03/19/2015  . Back pain 03/19/2015  . HTN (hypertension) 03/01/2015  . Anemia 12/19/2014  . Calculus of kidney 08/30/2014  . Hematuria 08/11/2014  . History of DVT (deep vein thrombosis)   . Neuralgia neuritis, sciatic nerve 05/22/2014  . 1St degree AV block 04/24/2014  . History of colon polyps 04/24/2014  . Adaptive colitis 04/24/2014  . APC (atrial premature contractions) 04/24/2014  . Benign neoplasm of meninges ([place]) 02/10/2014  . Barrett esophagus 02/03/2014  . Spinal stenosis 12/15/2013  . Allergic rhinitis 11/21/2013  . COPD with emphysema Gold D   . GERD (gastroesophageal reflux disease)   . Lactose intolerance   . DDD (degenerative disc disease), lumbosacral 06/11/2010  . Diffuse cerebrovascular disease 06/11/2010    Past Medical History  Diagnosis Date  . Pneumonia   . COPD (chronic obstructive pulmonary disease) ([place])   . Hypertension   . DVT (deep  venous thrombosis) ([place]) 1980s, recurrent 2015    Right DVT 2015  on xarelto  . GERD (gastroesophageal reflux disease)   . Lactose intolerance   . Spinal stenosis   . Meningioma ([place])     followed by Dr Christella Noa    Past Surgical History  Procedure Laterality Date  . Foot surgery    . Cholecystectomy    . Tonsillectomy    . Tubal ligation      Social History  Substance Use Topics  . Smoking status: Former Smoker -- 1.50 packs/day for 56 years    Types: Cigarettes    Quit date: 04/29/2012  . Smokeless tobacco: Never Used     Comment: quit smoking 3 years ago  . Alcohol Use: No     Comment: quit drinking beer 2005    Family  History  Problem Relation Age of Onset  . Lupus Sister   . COPD Father     smoker deceased.   [name] Heart attack Son     Allergies  Allergen Reactions  . Incruse Ellipta [Umeclidinium Bromide] Shortness Of Breath  . Tramadol Shortness Of Breath  . Amlodipine     Other reaction(s): SWELLING  . Aspirin     Other reaction(s): PALPITATIONS Other reaction(s): DIFFICULTY BREATHING Heart flutter  . Codeine Other (See Comments)    Hallucinations, can take Tylenol #3 now  . Doxycycline     Other reaction(s): NAUSEA,VOMITING  . Hydrocodone     hallucinations  . Losartan     unknown  . Propoxyphene Nausea Only    Other reaction(s): NAUSEA  . Gabapentin     Other reaction(s): Other (See Comments) She could not swallow the large pill  . Tiotropium Itching and Rash    Medication list has been reviewed and updated.  Current Outpatient Prescriptions on File Prior to Visit  Medication Sig Dispense Refill  . acetaminophen-codeine (TYLENOL #3) 300-30 MG tablet Take 1-2 tablets by mouth every 8 (eight) hours as needed for moderate pain or severe pain. 60 tablet 0  . albuterol (PROVENTIL HFA;VENTOLIN HFA) 108 (90 Base) MCG/ACT inhaler Inhale 2 puffs into the lungs every 6 (six) hours as needed for wheezing or shortness of breath. 1 Inhaler 5  . albuterol (PROVENTIL) (2.5 MG/3ML) 0.083% nebulizer solution TAKE 3 ML'S BY NEBULIZATION EVERY 6 HOURS AS NEEDED FOR SHORTNESS OF BREATH AND AS NEEDED 375 mL 11  . ALPRAZolam (XANAX) 0.25 MG tablet Take 1/2 - 1 tablet by mouth daily as needed 30 tablet 0  . budesonide (PULMICORT) 0.25 MG/2ML nebulizer solution Take 2 mLs (0.25 mg total) by nebulization 2 (two) times daily. DX: J43.9 120 mL 12  . cholecalciferol (VITAMIN D) 1000 UNITS tablet Take 1,000 Units by mouth daily.    . feeding supplement (BOOST / RESOURCE BREEZE) LIQD Take 1 Container by mouth 3 (three) times daily between meals. 21 Container 0  . Ferrous Sulfate (IRON SLOW RELEASE) 143 (45 Fe) MG  TBCR Take by mouth daily.    . fluticasone furoate-vilanterol (BREO ELLIPTA) 100-25 MCG/INH AEPB Inhale 1 puff into the lungs daily. 1 each 6  . Multiple Vitamins-Minerals (MULTIVITAMIN & MINERAL PO) Take 1 tablet by mouth daily.    . OXYGEN Inhale 3 L/hr into the lungs.    . potassium chloride SA (K-DUR,KLOR-CON) 20 MEQ tablet Take 1 tablet (20 mEq total) by mouth daily. 30 tablet 5  . predniSONE (DELTASONE) 10 MG tablet Take 1 tablet (10 mg total) by mouth daily with breakfast. Take 50mg   daily for 3days,Take 40mg  daily for 3days,Take 30mg  daily for 3days,Take 20mg  daily for 3days,Take 10mg  daily for 3days, then stop 45 tablet 0  . ranitidine (ZANTAC) 300 MG tablet Take 1 tablet (300 mg total) by mouth at bedtime. 1 tablet at bedtime 30 tablet 5  . sodium chloride (OCEAN) 0.65 % SOLN nasal spray Place 2 sprays into both nostrils as needed for congestion. Reported on 12/31/2015    . solifenacin (VESICARE) 5 MG tablet Take 5 mg by mouth daily.    . verapamil (CALAN-SR) 240 MG CR tablet Take 2 tablets (480 mg total) by mouth daily. (Patient taking differently: Take 480 mg by mouth daily. Alternate between [1] daily and [2] daily) 60 tablet 5   No current facility-administered medications on file prior to visit.    Review of Systems:  As per HPI- otherwise negative.   Physical Examination: Filed Vitals:   03/26/16 1124  BP: 140/60  Pulse: 73  Temp: 98.2 F (36.8 C)   Filed Vitals:   03/26/16 1124  Height: 5\' 5"  (1.651 m)   There is no weight on file to calculate BMI. Ideal Body Weight: Weight in (lb) to have BMI = 25: 149.9  GEN: WDWN, NAD, Non-toxic, A & O x 3. Very thin, on oxygen via [place] HEENT: Atraumatic, Normocephalic. Neck supple. No masses, No LAD. Ears and Nose: No external deformity. CV: RRR, No M/G/R. No JVD. No thrill. No extra heart sounds. PULM: CTA B, no wheezes, crackles, rhonchi. No retractions. No resp. distress. No accessory muscle use. ABD: S, NT, ND. No rebound.  No HSM.  At this time she does not have any abd tenderness "I took a pain pill EXTR: No c/c/e NEURO using WC as too SOB with walking PSYCH: Normally interactive. Conversant. Not depressed or anxious appearing.  Calm demeanor.   Dg Chest 2 View  03/26/2016  CLINICAL DATA:  COPD.  Follow-up possible pulmonary edema EXAM: CHEST  2 VIEW COMPARISON:  03/19/2016 FINDINGS: Emphysema with hyperinflation. There is again interstitial coarsening at the bases, likely from patient's chronic lung disease. No vascular pedicle widening, pleural fluid, or generalized Kerley lines for CHF. Normal heart size and aortic contours. IMPRESSION: Emphysema without acute superimposed finding. Interstitial opacities are considered bronchitic. Electronically Signed   By: Monte Fantasia M.D.   On: 03/26/2016 12:33   US Venous Img Lower Unilateral Right  03/19/2016  CLINICAL DATA:  Right upper leg pain for 1 week with bruising since bumping into a table. History of deep venous thrombosis. Subsequent encounter. EXAM: RIGHT LOWER EXTREMITY VENOUS DOPPLER ULTRASOUND TECHNIQUE: Gray-scale sonography with graded compression, as well as color Doppler and duplex ultrasound were performed to evaluate the lower extremity deep venous systems from the level of the common femoral vein and including the common femoral, femoral, profunda femoral, popliteal and calf veins including the posterior tibial, peroneal and gastrocnemius veins when visible. The superficial great saphenous vein was also interrogated. Spectral Doppler was utilized to evaluate flow at rest and with distal augmentation maneuvers in the common femoral, femoral and popliteal veins. COMPARISON:  Right lower extremity ultrasound 10/22/2015. FINDINGS: Contralateral Common Femoral Vein: Respiratory phasicity is normal and symmetric with the symptomatic side. No evidence of thrombus. Normal compressibility. Common Femoral Vein: No evidence of thrombus. Normal compressibility,  respiratory phasicity and response to augmentation. Saphenofemoral Junction: No evidence of thrombus. Normal compressibility and flow on color Doppler imaging. Profunda Femoral Vein: No evidence of thrombus. Normal compressibility and flow on color Doppler imaging. Femoral  Vein: No evidence of thrombus. Normal compressibility, respiratory phasicity and response to augmentation. Popliteal Vein: No evidence of thrombus. Normal compressibility, respiratory phasicity and response to augmentation. Calf Veins: No evidence of thrombus. Normal compressibility and flow on color Doppler imaging. Superficial Great Saphenous Vein: No evidence of thrombus. Normal compressibility and flow on color Doppler imaging. Venous Reflux:  None. Other Findings: Scanning in the region of concern demonstrates an echogenic area measuring 2.1 x 0.7 x 2.5 cm compatible with hematoma in the subcutaneous fatty tissues. IMPRESSION: No evidence of deep venous thrombosis. Likely hematoma subcutaneous fatty tissues in the region of concern. Electronically Signed   By: Inge Rise M.D.   On: 03/19/2016 11:14   Dg Chest Portable 1 View  03/19/2016  CLINICAL DATA:  Increasing shortness of breath for 2 weeks. History of COPD and hypertension. EXAM: PORTABLE CHEST 1 VIEW COMPARISON:  CT and radiographs 08/30/2015. FINDINGS: 1155 hours. There is mild patient rotation to the right. The heart size and mediastinal contours are stable. The lungs are hyperinflated. There is increased interstitial prominence with a basilar predominance, most consistent with superimposed edema. No confluent airspace opacity, pneumothorax or significant pleural effusion. The bones appear unchanged. IMPRESSION: Increased interstitial prominence suspicious for edema superimposed on emphysema. Findings suggest congestive heart failure. Electronically Signed   By: Richardean Sale M.D.   On: 03/19/2016 12:15     Assessment and Plan: COPD, severe ([place]) - Plan: DG Chest 2  View, For home use only DME oxygen  Lower abdominal pain - Plan: Urine culture, CT ABDOMEN PELVIS W CONTRAST, CANCELED: CT ABDOMEN PELVIS W CONTRAST  Frailty  Loss of weight - Plan: CT ABDOMEN PELVIS W CONTRAST  Here today for a hospital follow-up; she was admitted last week for 2 nights with COPD exacerbation.  She notes that her COPD seems to be getting overall worse and she still feels SOB on 3L of oxygen.  Would like to increase this.  Called to speak with her primary pulmonology team but they are not available.  Spoke with Dr. Melvyn Novas- we are able to increase her oxygen to 4L but she will need humidification in order to increase her oxygen flow level safely. [facility] and was able to order this for her.  Let pt know that she should hear from [facility] soon, faxed order to them.   In the meantime reassured her that waiting until August to eval microhematuria is ok She has noted a persistent abdominal pain for several months and is upset/ concerned that she cannot gain weight. Explained that her energy needs are very high due to her COPD and this is likely why she is so thin.  However with complaint of abd pain for several months will proceed with a CT scan to evaluate this further.  Discussed severity of her disease with her.  She does not seem to understand that her COPD is very severe and that it will limit her lifespan.  She expressed appreciation for being made aware of the seriousness of her COPD Signed Lamar Blinks, MD  Received her urine culture and CT, called on 6/30.  No answer but LMOM- will send her a copy. No UTI, CT did not show any cause of pain or weiht loss   Results for orders placed or performed in visit on 03/26/16  Urine culture  Result Value Ref Range   Colony Count 25,000 COLONIES/ML    Organism ID, Bacteria Multiple bacterial morphotypes present, none    Organism ID, Bacteria  predominant. Suggest appropriate recollection if     Organism ID, Bacteria  clinically indicated.    Dg Chest 2 View  03/26/2016  CLINICAL DATA:  COPD.  Follow-up possible pulmonary edema EXAM: CHEST  2 VIEW COMPARISON:  03/19/2016 FINDINGS: Emphysema with hyperinflation. There is again interstitial coarsening at the bases, likely from patient's chronic lung disease. No vascular pedicle widening, pleural fluid, or generalized Kerley lines for CHF. Normal heart size and aortic contours. IMPRESSION: Emphysema without acute superimposed finding. Interstitial opacities are considered bronchitic. Electronically Signed   By: Monte Fantasia M.D.   On: 03/26/2016 12:33   Ct Abdomen Pelvis W Contrast  03/28/2016  CLINICAL DATA:  Persistent abdominal pain and weight loss. EXAM: CT ABDOMEN AND PELVIS WITH CONTRAST TECHNIQUE: Multidetector CT imaging of the abdomen and pelvis was performed using the standard protocol following bolus administration of intravenous contrast. CONTRAST:  152mL ISOVUE-300 IOPAMIDOL (ISOVUE-300) INJECTION 61% COMPARISON:  02/28/2016 FINDINGS: Lower chest and abdominal wall: Emphysema. There is hyperinflation with flat diaphragm. Hepatobiliary: 7 mm hypervascular focus in segment 7 is stable since at least 2013 and attributed to flash fill hemangioma. Calcified granuloma in the right liver. Chronic hepatic steatosis with sparing peripherally. Simple appearing inferior right liver cyst.Cholecystectomy with chronic intra and extrahepatic bile duct enlargement. CBD measures up to 10 mm. Pancreas: Chronic main pancreatic duct enlargement measuring up to 4 mm. There is pancreas divisum anatomy. No mass lesion is seen. No acute inflammation. Spleen: Unremarkable. Adrenals/Urinary Tract: Negative adrenals. Bilateral nephrolithiasis and focal dystrophic cortical calcifications right upper pole. Simple appearing bilateral renal cysts. Unremarkable bladder. Stomach/Bowel:  No obstruction. No appendicitis. Reproductive:Probable small incidental fibroids. Vascular/Lymphatic:  Aortoiliac atherosclerosis. No mesenteric narrowing or occlusion to suggest mesenteric ischemia as cause of symptoms. No acute vascular abnormality. No mass or adenopathy. Other: No ascites or pneumoperitoneum. Musculoskeletal: Advanced lumbar disc and facet degeneration with levoscoliosis. IMPRESSION: 1. No specific explanation for abdominal pain and weight loss. 2. Chronic bile duct enlargement post cholecystectomy. 3. Chronic pancreatic duct enlargement with pancreas divisum anatomy. 4.  Emphysema. (ICD10-J43.9) 5.  Aortic Atherosclerosis (ICD10-170.0) 6. Hepatic steatosis. 7. Bilateral nephrolithiasis. Electronically Signed   By: Monte Fantasia M.D.   On: 03/28/2016 11:39   US Venous Img Lower Unilateral Right  03/19/2016  CLINICAL DATA:  Right upper leg pain for 1 week with bruising since bumping into a table. History of deep venous thrombosis. Subsequent encounter. EXAM: RIGHT LOWER EXTREMITY VENOUS DOPPLER ULTRASOUND TECHNIQUE: Gray-scale sonography with graded compression, as well as color Doppler and duplex ultrasound were performed to evaluate the lower extremity deep venous systems from the level of the common femoral vein and including the common femoral, femoral, profunda femoral, popliteal and calf veins including the posterior tibial, peroneal and gastrocnemius veins when visible. The superficial great saphenous vein was also interrogated. Spectral Doppler was utilized to evaluate flow at rest and with distal augmentation maneuvers in the common femoral, femoral and popliteal veins. COMPARISON:  Right lower extremity ultrasound 10/22/2015. FINDINGS: Contralateral Common Femoral Vein: Respiratory phasicity is normal and symmetric with the symptomatic side. No evidence of thrombus. Normal compressibility. Common Femoral Vein: No evidence of thrombus. Normal compressibility, respiratory phasicity and response to augmentation. Saphenofemoral Junction: No evidence of thrombus. Normal compressibility  and flow on color Doppler imaging. Profunda Femoral Vein: No evidence of thrombus. Normal compressibility and flow on color Doppler imaging. Femoral Vein: No evidence of thrombus. Normal compressibility, respiratory phasicity and response to augmentation. Popliteal Vein: No evidence of thrombus. Normal compressibility, respiratory  phasicity and response to augmentation. Calf Veins: No evidence of thrombus. Normal compressibility and flow on color Doppler imaging. Superficial Great Saphenous Vein: No evidence of thrombus. Normal compressibility and flow on color Doppler imaging. Venous Reflux:  None. Other Findings: Scanning in the region of concern demonstrates an echogenic area measuring 2.1 x 0.7 x 2.5 cm compatible with hematoma in the subcutaneous fatty tissues. IMPRESSION: No evidence of deep venous thrombosis. Likely hematoma subcutaneous fatty tissues in the region of concern. Electronically Signed   By: Inge Rise M.D.   On: 03/19/2016 11:14   Dg Chest Portable 1 View  03/19/2016  CLINICAL DATA:  Increasing shortness of breath for 2 weeks. History of COPD and hypertension. EXAM: PORTABLE CHEST 1 VIEW COMPARISON:  CT and radiographs 08/30/2015. FINDINGS: 1155 hours. There is mild patient rotation to the right. The heart size and mediastinal contours are stable. The lungs are hyperinflated. There is increased interstitial prominence with a basilar predominance, most consistent with superimposed edema. No confluent airspace opacity, pneumothorax or significant pleural effusion. The bones appear unchanged. IMPRESSION: Increased interstitial prominence suspicious for edema superimposed on emphysema. Findings suggest congestive heart failure. Electronically Signed   By: Richardean Sale M.D.   On: 03/19/2016 12:15

## 2016-03-26 NOTE — Progress Notes (Signed)
Pre visit review using our clinic review tool, if applicable. No additional management support is needed unless otherwise documented below in the visit note. 

## 2016-03-26 NOTE — Patient Instructions (Addendum)
Please go downstairs for a chest x-ray on the ground floor today. I will call you later on today with your x-ray results.  We can increase your nasal canula oxygen to 4L as long as it is humidified.  I will be in touch [facility] for you about this

## 2016-03-27 ENCOUNTER — Telehealth: Payer: Self-pay | Admitting: Family Medicine

## 2016-03-27 ENCOUNTER — Other Ambulatory Visit: Payer: Self-pay | Admitting: *Deleted

## 2016-03-27 LAB — URINE CULTURE

## 2016-03-27 NOTE — Telephone Encounter (Signed)
°  Relationship to patient: Self Can be reached: [phone]     Reason for call: Request call back to discuss O2 machine that was delivered to her. Patient thinks its not the right one

## 2016-03-27 NOTE — Patient Outreach (Signed)
Holly Page) Care Management  03/27/2016  Holly Page [birth year] [id number]  RN returned call to pt this week as requested and arranged a home visit for ongoing Holly Page, Holly Page services for community case management. Pt indicates she received a nebulizer device from [facility] but did not sent the attachments for the nebulizer medication. State she did not have time to go and obtain the supplies but the agency will mail the supply to pt today. RN reiterated on the COPD action plan and verified pt remains in the GREEN zone with no distressful breathing. The plan of care an goals were discussed from the last conversation as pt remains receptive to managing her COPD with the information provided. RN has offered a home visit and based upon pt's scheduling able to schedule in July. Pt made aware that the transition of care call will proceed and she will receive a call from Holly David, RN case manager who would be covering from his RN while out of the office next week (pt very receptive). No other request or inquires at this time. Will follow up accordingly.Holly Mina, RN Care Management Coordinator [place] Office [phone]

## 2016-03-27 NOTE — Telephone Encounter (Signed)
Spoke to pt. Pt feels that the her O2 machine does not have a humidifier, also feels the air blowing out of her machine is too heavy and her nose is burning since increasing to 4L.   I called [facility] to verify if pt's was delivered O2 machine with humidifier and was informed that she was. [facility] will have their troubleshooting team call the pt and walk her through the steps on properly using the equipment and ensure she has the correct device.   Called pt back to inform her that [facility] will give her a call today. Asked pt to give me a call back if she does not hear from [place] or if she has any other questions.

## 2016-03-28 ENCOUNTER — Encounter (HOSPITAL_BASED_OUTPATIENT_CLINIC_OR_DEPARTMENT_OTHER): Payer: Self-pay

## 2016-03-28 ENCOUNTER — Ambulatory Visit (HOSPITAL_BASED_OUTPATIENT_CLINIC_OR_DEPARTMENT_OTHER)
Admission: RE | Admit: 2016-03-28 | Discharge: 2016-03-28 | Disposition: A | Discharge status: Home / Self Care | Payer: Medicare Other | Source: Ambulatory Visit | Attending: Family Medicine | Admitting: Family Medicine

## 2016-03-28 DIAGNOSIS — R634 Abnormal weight loss: Secondary | ICD-10-CM

## 2016-03-28 DIAGNOSIS — Q453 Other congenital malformations of pancreas and pancreatic duct: Secondary | ICD-10-CM | POA: Diagnosis not present

## 2016-03-28 DIAGNOSIS — R1030 Lower abdominal pain, unspecified: Secondary | ICD-10-CM | POA: Insufficient documentation

## 2016-03-28 DIAGNOSIS — N200 Calculus of kidney: Secondary | ICD-10-CM | POA: Diagnosis not present

## 2016-03-28 DIAGNOSIS — K760 Fatty (change of) liver, not elsewhere classified: Secondary | ICD-10-CM | POA: Diagnosis not present

## 2016-03-28 DIAGNOSIS — I700 Atherosclerosis of aorta: Secondary | ICD-10-CM | POA: Insufficient documentation

## 2016-03-28 DIAGNOSIS — J439 Emphysema, unspecified: Secondary | ICD-10-CM | POA: Insufficient documentation

## 2016-03-28 MED ORDER — IOPAMIDOL (ISOVUE-300) INJECTION 61%
100.0000 mL | Freq: Once | INTRAVENOUS | Status: AC | PRN
  Administered 2016-03-28: 100 mL via INTRAVENOUS

## 2016-03-31 ENCOUNTER — Other Ambulatory Visit: Payer: Self-pay | Admitting: *Deleted

## 2016-03-31 NOTE — Patient Outreach (Signed)
[facility] Care Management  03/31/2016  COLLYN OWYANG [birth year] [id number]   CSW received a new referral on patient from patient's RNCM with [facility], [name] reporting that patient would benefit from social work services and resources to assist with arranging transportation to and from physician appointments.  More specifically, Ms. Zigmund Daniel thought that patient may be interested in receiving assistance with completing a SCAT Paramedic) application through the Technical sales engineer.  CSW will also refer patient to [employer] through [employer] [facility] [place]. CSW made an initial attempt to try and contact patient today to perform phone assessment, as well as assess and assist with social needs and services, without success.  A HIPAA complaint message was left for patient on voicemail.  CSW is currently awaiting a return call. [name], BSW, MSW, LCSW  Licensed Education officer, [facility]  Mailing [place] [address], [place], [place] [address] Physical Address-[address], [place], [place] [address] Toll Free Main # [phone] Fax # [phone] Cell # [phone]  Fax # [phone]  [name]

## 2016-04-03 ENCOUNTER — Other Ambulatory Visit: Payer: Self-pay | Admitting: *Deleted

## 2016-04-03 DIAGNOSIS — J449 Chronic obstructive pulmonary disease, unspecified: Secondary | ICD-10-CM | POA: Diagnosis not present

## 2016-04-03 NOTE — Patient Outreach (Signed)
Covering for Holly Page, weekly transition of care call placed to member.  She state she is "doing fine" although she does still have some shortness of breath with activity.  She report that she is back to her baseline and in the GREEN zone on the COPD zone chart.  She confirms that she has received all of the supplies for her new nebulizer machine and that it is functioning appropriately.  She denies any increase in sputum production, reporting clear sputum when her cough is productive, which she state is rare.  She state she is taking all medications as prescribed, including breathing treatments, denies questions.  Noted in chart that LCSW, J. Saporito, made an attempt to contact member without success regarding transportation.  Member state that she did speak with Mrs. Saporito and that she is waiting on a return call.  She confirms that she does have immediate transportation, but is looking to have an alternative when she "don't feel like driving."  She does have appointment with the pulmonologist next week, state her son will transport her.  She denies any questions or concerns at this time, advised that assigned care manager will follow up next week.  Valente David, [place], MSN [place] [phone]

## 2016-04-07 ENCOUNTER — Telehealth: Payer: Self-pay | Admitting: Family Medicine

## 2016-04-07 ENCOUNTER — Other Ambulatory Visit: Payer: Self-pay | Admitting: *Deleted

## 2016-04-07 ENCOUNTER — Encounter: Payer: Self-pay | Admitting: *Deleted

## 2016-04-07 MED ORDER — RANITIDINE HCL 300 MG PO TABS: 300.0000 mg | ORAL_TABLET | Freq: Every day | ORAL | Status: DC

## 2016-04-07 NOTE — Telephone Encounter (Signed)
°  Relationship to patient: Self Can be reached: [phone] Pharmacy:  [id number] [place] [place] [address] [phone] (Phone) [phone] (Fax)         Reason for call: Request refill on ranitidine (ZANTAC) 300 MG tablet [id number]   Request call back prior to sending Rx to pharmacy.

## 2016-04-07 NOTE — Patient Outreach (Signed)
[place] [facility]) Care Management  04/07/2016  Holly Page 09-Jul-1936 [id number]    RN spoke with pt today and introduced the [facility] program and purpose for today's call. Pt very receptive and agreed to a home visit that was scheduled for tomorrow. Rn inquired further on pt's assessment and due to another appointments pt has soon after [facility] scheduled home visit pt agreed to respond to most of the inquires at this time. The initial home visit was completed. Pt continue to do well however not pleased with her health at this time and aware of the prognosis of her COPD. Pt states she underwent some education on her COPD in the past and aware of some of the COPD action plan but willing to review this information once again on the home visits. RN reviewed the information and goals that were generated earlier with this RN case manager. RN will once again review this information on the scheduled home visit and verify any needed community resources if needed at that time. Will also scheduled the next transition of care call for next week.  [name], RN Care Management Coordinator [place] Office [phone]

## 2016-04-08 ENCOUNTER — Encounter: Payer: Self-pay | Admitting: Family Medicine

## 2016-04-08 ENCOUNTER — Encounter: Payer: Self-pay | Admitting: *Deleted

## 2016-04-08 ENCOUNTER — Other Ambulatory Visit: Payer: Self-pay | Admitting: *Deleted

## 2016-04-08 DIAGNOSIS — R109 Unspecified abdominal pain: Secondary | ICD-10-CM | POA: Diagnosis not present

## 2016-04-08 DIAGNOSIS — R142 Eructation: Secondary | ICD-10-CM | POA: Diagnosis not present

## 2016-04-08 DIAGNOSIS — R12 Heartburn: Secondary | ICD-10-CM | POA: Diagnosis not present

## 2016-04-08 DIAGNOSIS — K449 Diaphragmatic hernia without obstruction or gangrene: Secondary | ICD-10-CM | POA: Diagnosis not present

## 2016-04-08 NOTE — Patient Outreach (Signed)
[facility] [facility]) Care Management   04/08/2016  Holly Page [birth year] [id number]  Holly Page is an 80 y.o. female Pt remains receptive to the [facility] program and services for community home visits. Subjective:  COPD: Pt reports having COPD for over 10 years and has some knowledge of the action zone but not in detail as discussed today. Pt appreciative for the teachings and education completed today and will following the teachings taught with response to her symptoms. Pt feels better about managing her COPD with this knowledge base provided today. Pt aware to review the printed material for review on the next home visit.  RESPIRATORY: Pt states she has a history of wheezing for a very long time with no major issues or problems until her recent hospitalization. Although pt has receive education today on how to response to her symptoms initially pt indicated she would call 911 but aware to use her emergency inhaler along with other interventions prior to calling 911.  HOME O2: pt reports she uses 4 liters which was recent changed. States she is breathing alot better on the higher dose. Pt states she has a humidifier on her concentrator with no dryness reported. Pt uses continuously with no problems. Denies any trouble breathing and uses her nebulizers and inhalers as prescribed.  MEDICAL APPOINTMENTS: Pt reports all upcoming appointments over the next month and indicated she will see her GI doctor today for possible results of a recent test. States she has not missed any appointments.  Objective:   Review of Systems  Constitutional: Negative.   HENT: Negative.   Eyes: Negative.   Respiratory: Negative.   Cardiovascular: Negative.   Gastrointestinal: Negative.   Genitourinary: Negative.   Musculoskeletal: Positive for back pain.       History of spinal stenosis  Skin: Negative.   Neurological: Negative.   Endo/Heme/Allergies: Negative.   Psychiatric/Behavioral:  Negative.     Physical Exam  Constitutional: She is oriented to person, place, and time. She appears well-developed and well-nourished.  HENT:  Right Ear: External ear normal.  Left Ear: External ear normal.  Eyes: EOM are normal.  Neck: Normal range of motion.  Cardiovascular: Normal heart sounds.   Respiratory: Effort normal. She has wheezes.  History of wheezing (treatment inhalers and nebulizers) with no distressful breathing.  GI: Soft. Bowel sounds are normal.  Musculoskeletal: Normal range of motion.  Neurological: She is alert and oriented to person, place, and time.  Skin: Skin is warm and dry.  Psychiatric: She has a normal mood and affect. Her behavior is normal. Judgment and thought content normal.    Encounter Medications:   Outpatient Encounter Prescriptions as of 04/08/2016  Medication Sig Note  . acetaminophen-codeine (TYLENOL #3) 300-30 MG tablet Take 1-2 tablets by mouth every 8 (eight) hours as needed for moderate pain or severe pain.   [name] albuterol (PROVENTIL HFA;VENTOLIN HFA) 108 (90 Base) MCG/ACT inhaler Inhale 2 puffs into the lungs every 6 (six) hours as needed for wheezing or shortness of breath.   [name] albuterol (PROVENTIL) (2.5 MG/3ML) 0.083% nebulizer solution TAKE 3 ML'S BY NEBULIZATION EVERY 6 HOURS AS NEEDED FOR SHORTNESS OF BREATH AND AS NEEDED   . ALPRAZolam (XANAX) 0.25 MG tablet Take 1/2 - 1 tablet by mouth daily as needed   . budesonide (PULMICORT) 0.25 MG/2ML nebulizer solution Take 2 mLs (0.25 mg total) by nebulization 2 (two) times daily. DX: J43.9   . cholecalciferol (VITAMIN D) 1000 UNITS tablet Take 1,000 Units by  mouth daily.   . feeding supplement (BOOST / RESOURCE BREEZE) LIQD Take 1 Container by mouth 3 (three) times daily between meals.   . Ferrous Sulfate (IRON SLOW RELEASE) 143 (45 Fe) MG TBCR Take by mouth daily.   . fluticasone furoate-vilanterol (BREO ELLIPTA) 100-25 MCG/INH AEPB Inhale 1 puff into the lungs daily.   . Multiple  Vitamins-Minerals (MULTIVITAMIN & MINERAL PO) Take 1 tablet by mouth daily.   . OXYGEN Inhale 3 L/hr into the lungs.   . potassium chloride SA (K-DUR,KLOR-CON) 20 MEQ tablet Take 1 tablet (20 mEq total) by mouth daily.   . predniSONE (DELTASONE) 10 MG tablet Take 1 tablet (10 mg total) by mouth daily with breakfast. Take 50mg  daily for 3days,Take 40mg  daily for 3days,Take 30mg  daily for 3days,Take 20mg  daily for 3days,Take 10mg  daily for 3days, then stop (Patient not taking: Reported on 04/07/2016)   . ranitidine (ZANTAC) 300 MG tablet Take 1 tablet (300 mg total) by mouth at bedtime. 1 tablet at bedtime   . sodium chloride (OCEAN) 0.65 % SOLN nasal spray Place 2 sprays into both nostrils as needed for congestion. Reported on 12/31/2015   . solifenacin (VESICARE) 5 MG tablet Take 5 mg by mouth daily.   . verapamil (CALAN-SR) 240 MG CR tablet Take 2 tablets (480 mg total) by mouth daily. (Patient taking differently: Take 480 mg by mouth daily. Alternate between [1] daily and [2] daily) 03/19/2016: Patient took two pills today (6/21) and will need to take one pill tomorrow (6/22)   No facility-administered encounter medications on file as of 04/08/2016.    Functional Status:   In your present state of health, do you have any difficulty performing the following activities: 04/07/2016 03/19/2016  Hearing? N N  Vision? N N  Difficulty concentrating or making decisions? N N  Walking or climbing stairs? Y Y  Dressing or bathing? N N  Doing errands, shopping? N N  Preparing Food and eating ? N -  Using the Toilet? N -  In the past six months, have you accidently leaked urine? N -  Do you have problems with loss of bowel control? N -  Managing your Medications? N -  Managing your Finances? N -  Housekeeping or managing your Housekeeping? N -  BP 112/68 mmHg  Pulse 66  Resp 20  Ht 1.651 m (5\' 5" )  Wt 103 lb (46.72 kg)  BMI 17.14 kg/m2  SpO2 99%  Fall/Depression Screening:    PHQ 2/9 Scores  04/07/2016 12/31/2015 09/18/2014  PHQ - 2 Score 0 0 1  Exception Documentation - Patient refusal -    Assessment:  Introduce to [facility] related to case management services Case management related to COPD Respiratory issues related to expiratory wheezing Home O2 Adherence related to all scheduled medical appointments   Plan:  Will verified pt's consent and reiterated on the [facility] services and verify program assigned for teachings and education today.  Will present COPD EMMI printed material and discussed COPD action plan and what to do if acute symptoms should occur. EMMI present COPD/ COPD: What you can do/COPD: When to get help. Will verify pt with clear understanding once educated on the provided printed material.  Will completed physical assessment. Note audible expiratory wheezing noted today upon assessment-will verify pt is using her inhalers and nebulizers (verified). Will discussed purse lip breathing and what to do if acute symptoms should occur. Vitals will be noted above indicating pt's saturation at 99% with no distress breathing noted. Will  discuss medications  that can be used to assist pt with her symptoms if needed. Will verify home O2 concentrator and strongly encouraged pt to use the prescribed liters (4) as recommended. Will also verify no signs or symptoms related to pt's breathing is affected by the 4 liters uses and encouraged adherence with the recommended liters. Will verify pt has attended all scheduled medical appointments and RN will continue to encourage adherence with her attendance.  Plan of care and goals discussed as generated on call yesterday. Pt remains receptive to this plan of care as RN will re-evaluate and scheduled next home visit in August. Will verify pt has a clear understanding of today's discussion and education. No other inquires or request at this time. Will notify pt's primary of pt's involvement in the [facility] program and services.   [name], RN Care  Management Coordinator [place] Office [phone]

## 2016-04-10 ENCOUNTER — Encounter: Payer: Self-pay | Admitting: Adult Health

## 2016-04-10 ENCOUNTER — Ambulatory Visit (INDEPENDENT_AMBULATORY_CARE_PROVIDER_SITE_OTHER): Payer: Medicare Other | Admitting: Adult Health

## 2016-04-10 VITALS — BP 114/72 | HR 63 | Ht 67.0 in | Wt 106.0 lb

## 2016-04-10 DIAGNOSIS — J9611 Chronic respiratory failure with hypoxia: Secondary | ICD-10-CM | POA: Diagnosis not present

## 2016-04-10 DIAGNOSIS — J441 Chronic obstructive pulmonary disease with (acute) exacerbation: Secondary | ICD-10-CM | POA: Diagnosis not present

## 2016-04-10 DIAGNOSIS — J9610 Chronic respiratory failure, unspecified whether with hypoxia or hypercapnia: Secondary | ICD-10-CM | POA: Insufficient documentation

## 2016-04-10 NOTE — Assessment & Plan Note (Signed)
  Continue on Oxygen 4l/m with activity and 3l/m at rest .  Stress reducers with breathing exercises as we discussed.  Follow up in 4 weeks as planned and As needed   Please contact office for sooner follow up if symptoms do not improve or worsen or seek emergency care

## 2016-04-10 NOTE — Progress Notes (Signed)
Subjective:    Patient ID: Holly Page, female    DOB: 1936-05-09, 80 y.o.   MRN: [id number]  HPI  80 y.o. F with Gold D Copd primary emphysema on O2 >GOLD card patient  Hx of DVT -05/2014 (tx w/ xarelto until 12/2014 ) -followed by hematology  Left arm superficial venous thrombus , Xarelto 01/25/15 >followed by Hematology >stopped 07/2015  CT chest, on March 19 2015 >that showed no evidence of pulmonary embolism  and stable. Severe COPD changes. Last spirometry showed FEV1 at 30% in 2015.  She has been tried on [place] but was unable to tolerate. Oxygen does drop with pulsing O2 on 2 L was able to keep above 90% on pulsing 3 L. Patient says she has been evaluated by cardiology with a negative stress test recently.    04/10/2016   Follow up :   : COPD /O2 dependent  Pt presents for a 4 week office visit for GOLD D COPD .  Was admitted on 6/21 with COPD flare . Tx with abx and steroids . Feeling better now . Feels good today .  O2 was increased to 4lm with activity .  Currently on BREO and Pulmicort. Along with albuterol nebs Four times a day   Says she does feel her breathing is stronger.  She has tried several inhalers and nebs in past., has several intolerances.   Patient denies any hemoptysis, orthopnea, PND or chest pain. Prevnar/PVX are utd.  She remains somewhat indepndent, lives alone and drives. Gets meals on wheels , 1 meal daily for 5 days.  Denies chest pain, orthopnea, edema or fever.   Past Medical History  Diagnosis Date  . Pneumonia   . COPD (chronic obstructive pulmonary disease) ([place])   . Hypertension   . DVT (deep venous thrombosis) ([place]) 1980s, recurrent 2015    Right DVT 2015  on xarelto  . GERD (gastroesophageal reflux disease)   . Lactose intolerance   . Spinal stenosis   . Meningioma [facility])     followed by Dr Christella Noa   Current Outpatient Prescriptions on File Prior to Visit  Medication Sig Dispense Refill  .  acetaminophen-codeine (TYLENOL #3) 300-30 MG tablet Take 1-2 tablets by mouth every 8 (eight) hours as needed for moderate pain or severe pain. 60 tablet 0  . albuterol (PROVENTIL HFA;VENTOLIN HFA) 108 (90 Base) MCG/ACT inhaler Inhale 2 puffs into the lungs every 6 (six) hours as needed for wheezing or shortness of breath. 1 Inhaler 5  . albuterol (PROVENTIL) (2.5 MG/3ML) 0.083% nebulizer solution TAKE 3 ML'S BY NEBULIZATION EVERY 6 HOURS AS NEEDED FOR SHORTNESS OF BREATH AND AS NEEDED 375 mL 11  . ALPRAZolam (XANAX) 0.25 MG tablet Take 1/2 - 1 tablet by mouth daily as needed 30 tablet 0  . budesonide (PULMICORT) 0.25 MG/2ML nebulizer solution Take 2 mLs (0.25 mg total) by nebulization 2 (two) times daily. DX: J43.9 120 mL 12  . cholecalciferol (VITAMIN D) 1000 UNITS tablet Take 1,000 Units by mouth daily.    . feeding supplement (BOOST / RESOURCE BREEZE) LIQD Take 1 Container by mouth 3 (three) times daily between meals. 21 Container 0  . Ferrous Sulfate (IRON SLOW RELEASE) 143 (45 Fe) MG TBCR Take by mouth daily.    . Multiple Vitamins-Minerals (MULTIVITAMIN & MINERAL PO) Take 1 tablet by mouth daily.    . OXYGEN Inhale 3 L/hr into the lungs.    . potassium chloride SA (  K-DUR,KLOR-CON) 20 MEQ tablet Take 1 tablet (20 mEq total) by mouth daily. 30 tablet 5  . ranitidine (ZANTAC) 300 MG tablet Take 1 tablet (300 mg total) by mouth at bedtime. 1 tablet at bedtime 90 tablet 2  . sodium chloride (OCEAN) 0.65 % SOLN nasal spray Place 2 sprays into both nostrils as needed for congestion. Reported on 12/31/2015    . solifenacin (VESICARE) 5 MG tablet Take 5 mg by mouth daily. Pt does not take daily (takes PRN)    . verapamil (CALAN-SR) 240 MG CR tablet Take 2 tablets (480 mg total) by mouth daily. (Patient taking differently: Take 480 mg by mouth daily. Alternate between [1] daily and [2] daily) 60 tablet 5  . fluticasone furoate-vilanterol (BREO ELLIPTA) 100-25 MCG/INH AEPB Inhale 1 puff into the lungs  daily. (Patient not taking: Reported on 04/10/2016) 1 each 6   No current facility-administered medications on file prior to visit.     Review of Systems  Constitutional:   No  weight loss, night sweats,  Fevers, chills, + fatigue, or  lassitude.  HEENT:   No headaches,  Difficulty swallowing,  Tooth/dental problems, or  Sore throat,                No sneezing, itching, ear ache, nasal congestion, post nasal drip,   CV:  No chest pain,  Orthopnea, PND, swelling in lower extremities, anasarca, dizziness, palpitations, syncope.   GI  Notes heartburn, no indigestion, abdominal pain, nausea, vomiting, diarrhea, change in bowel habits, loss of appetite, bloody stools.   Resp:  .    No chest wall deformity  Skin: no rash or lesions.  GU: no dysuria, change in color of urine, no urgency or frequency.  No flank pain, no hematuria   MS:  No joint pain or swelling.  No decreased range of motion.  No back pain.  Psych:  No change in mood or affect.+anxiety.  No memory loss.      Objective:   Physical Exam Filed Vitals:   04/10/16 1123  BP: 114/72  Pulse: 63  Height: 5\' 7"  (1.702 m)  Weight: 106 lb (48.081 kg)  SpO2: 92%     GEN: A/Ox3; pleasant , NAD, elderly and frail , on O2 , anxious   HEENT:  [place]/AT,  EACs-clear, TMs-wnl, NOSE-clear, THROAT-clear, no lesions  NECK:  Supple w/ fair ROM; no JVD; normal carotid impulses w/o bruits; no thyromegaly or nodules palpated; no lymphadenopathy.  RESP  Diminished in bases w/ no wheezing or rhonchi.no accessory muscle use, no dullness to percussion  CARD:  RRR, no m/r/g  , no peripheral edema, pulses intact, no cyanosis or clubbing.  GI:   Soft & nt; nml bowel sounds; no organomegaly or masses detected.  Musco: Warm bil, no deformities or joint swelling noted.   Neuro: alert, no focal deficits noted.  Anxious   Skin: Warm, no lesions or rashes .   Carollee Nussbaumer NP-C  [facility]  04/10/2016

## 2016-04-10 NOTE — Patient Instructions (Signed)
Continue on BREO 1 puff daily , rinse after use.   Continue on Albuterol Neb Four times a day  .  Continue on Pulmicort Neb Twice daily  .  Continue on Oxygen 4l/m with activity and 3l/m at rest .  Stress reducers with breathing exercises as we discussed.  Follow up in 4 weeks as planned and As needed   Please contact office for sooner follow up if symptoms do not improve or worsen or seek emergency care

## 2016-04-10 NOTE — Assessment & Plan Note (Signed)
Recent flare now resolved  Prone to frequent flare  Plan  Continue on BREO 1 puff daily , rinse after use.   Continue on Albuterol Neb Four times a day  .  Continue on Pulmicort Neb Twice daily  .  Continue on Oxygen 4l/m with activity and 3l/m at rest .  Stress reducers with breathing exercises as we discussed.  Follow up in 4 weeks as planned and As needed   Please contact office for sooner follow up if symptoms do not improve or worsen or seek emergency care

## 2016-04-14 ENCOUNTER — Other Ambulatory Visit: Payer: Self-pay | Admitting: *Deleted

## 2016-04-14 NOTE — Patient Outreach (Signed)
[place] [facility]) Care Management  04/14/2016  Holly Page 19-Sep-1936 [id number]  Transition of care  RN spoke with pt today and reiterated on the [facility] program and recent initial home visit. Pt receptive and remembers the discussion and education provided earlier in this week. Pt reports she is breathing well with no problems and RN able to verify pt attending all medical appointments and taking all her medications as prescribed with no delays. RN offered to continue with another transition of care call next week as the last call however will continue outreach home visits concerning her COPD (pt agreed).   [name], RN Care Management Coordinator [place] Office [phone]

## 2016-04-18 ENCOUNTER — Telehealth: Payer: Self-pay | Admitting: Family Medicine

## 2016-04-18 MED ORDER — POTASSIUM CHLORIDE CRYS ER 20 MEQ PO TBCR: 20.0000 meq | EXTENDED_RELEASE_TABLET | Freq: Every day | ORAL | Status: DC

## 2016-04-18 NOTE — Telephone Encounter (Signed)
Called pt to verify if she wanted me to send a rx for 60 or 90 days. Pt states that she wasn't sure which one insurance will cover. I sent the rx over for 90 day supply with 2 refills. Asked pt to give me a call back if her insurance want cover the 90 day supply. Pt verbalized understand.

## 2016-04-18 NOTE — Telephone Encounter (Signed)
Pt called in because she says that she need a refill on her potassium  Medication. Pt says that pharmacy stated that she need authorization for refill. Pt also says that she would like to have a 60 or 90 day supply?   Please assist pt further with getting medication   [id number] [place] [place] [address]. AT [address]

## 2016-04-22 ENCOUNTER — Other Ambulatory Visit: Payer: Self-pay | Admitting: *Deleted

## 2016-04-22 NOTE — Patient Outreach (Signed)
[facility] [facility]) Care Management  04/22/2016  Holly Page [birth year] [id number]   CSW made a second attempt to try and contact patient today to perform phone assessment, as well as assess and assist with social work needs and services, without success.  A HIPAA compliant message was left on voicemail.  CSW is currently awaiting a return call.  In the meantime, CSW has mailed patient a packet of resource information, which includes all of the following information: Armed forces technical officer through [employer] ([employer]) application CSW will make another attempt to try and contact patient in one week, if CSW does not receive a return call within the next few days. [name], BSW, MSW, LCSW  Licensed Education officer, environmental Health System  Mailing [place] [address] [place], [place] [address] Physical Address-[address], [place], [place] [address] Toll Free Main # [phone] Fax # [phone] Cell # [phone]  Fax # [phone]  [name]

## 2016-04-23 ENCOUNTER — Other Ambulatory Visit: Payer: Self-pay | Admitting: *Deleted

## 2016-04-23 NOTE — Patient Outreach (Signed)
[place] [facility]) Care Management  04/23/2016  Holly Page [birth year] [id number]   RN spoke with pt today who indicated she was doing "fine". States with her COPD she occasionally get SOB but this has been tolerable. Pt confirms she is attending all medical appointments and taking her prescribed medication as ordered. States she has enough supplies and never lets her medication get to low. RN confirms pt is in the GREEN zone with no acute issues at this time. RN confirmed the next home visit for August. Plan of care review with goals as pt remains adherent. Will remind pt of her next home visit appointments with this RN and re-evaluate pt's progress at that time.   [name], RN Care Management Coordinator [place] Office [phone]

## 2016-04-25 DIAGNOSIS — J449 Chronic obstructive pulmonary disease, unspecified: Secondary | ICD-10-CM | POA: Diagnosis not present

## 2016-04-29 ENCOUNTER — Other Ambulatory Visit: Payer: Self-pay | Admitting: *Deleted

## 2016-04-29 ENCOUNTER — Encounter: Payer: Self-pay | Admitting: *Deleted

## 2016-04-29 NOTE — Patient Outreach (Signed)
Holly Page) Care Management  04/29/2016  Holly Page [birth year] [id number]   CSW was able to make initial contact with patient today to perform phone assessment, as well as assess and assist with social work needs and services.  CSW introduced self, explained role and types of services provided through [place] [facility] ([place] Management).  CSW further explained to patient that CSW works with patient's RNCM, [name] also with [place] Management. CSW then explained the reason for the call, indicating that Ms. Zigmund Daniel thought that patient would benefit from social work services and resources to assist with arranging transportation for patient to and from physician appointments.  CSW obtained two HIPAA compliant identifiers from patient, which included patient's name and date of birth. Patient admits there was a time that she needed transportation assistance, having to rely on family members to transport her.  However, patient now reports that she is no longer in need of assistance with transportation, as she does not currently have as many appointments.  CSW was able to confirm with patient that patient received the packet of resource information mailed to her home by CSW.  This packet of information included a [employer] Paramedic) application, as well as a Personal assistant through [employer] [place].  Patient was appreciative of the call and information, but reports no social work needs at present. CSW will perform a case closure on patient, as all goals of treatment have been met from social work standpoint and no additional social work needs have been identified at this time.  CSW will notify patient's RNCM with [place] Management, [name] of CSW's plans to close patient's case.  CSW will fax an update to patient's Primary Care Physician, Dr. Janett Billow Copland to ensure  that they are aware of CSW's involvement with patient's plan of care.  CSW will submit a case closure request to Verlon Setting, Care Management Assistant with [place] Management, in the form of an [employer].  CSW will ensure that Mrs. Comer is aware of [name], RNCM with [place] Management, continued involvement with patient's care. Holly Page, BSW, MSW, LCSW  Licensed Education officer, environmental Health System  Mailing [place] [address] [place], [place] [address] Physical Address-[address], [place], [place] [address] Toll Free Main # [phone] Fax # [phone] Cell # [phone]  Fax # [phone]  [email or web address]

## 2016-05-04 DIAGNOSIS — J449 Chronic obstructive pulmonary disease, unspecified: Secondary | ICD-10-CM | POA: Diagnosis not present

## 2016-05-06 DIAGNOSIS — N200 Calculus of kidney: Secondary | ICD-10-CM | POA: Diagnosis not present

## 2016-05-06 DIAGNOSIS — R319 Hematuria, unspecified: Secondary | ICD-10-CM | POA: Diagnosis not present

## 2016-05-06 DIAGNOSIS — Z87448 Personal history of other diseases of urinary system: Secondary | ICD-10-CM | POA: Diagnosis not present

## 2016-05-08 DIAGNOSIS — R1031 Right lower quadrant pain: Secondary | ICD-10-CM | POA: Diagnosis not present

## 2016-05-08 DIAGNOSIS — N200 Calculus of kidney: Secondary | ICD-10-CM | POA: Diagnosis not present

## 2016-05-08 DIAGNOSIS — R1011 Right upper quadrant pain: Secondary | ICD-10-CM | POA: Diagnosis not present

## 2016-05-09 ENCOUNTER — Other Ambulatory Visit: Payer: Self-pay | Admitting: *Deleted

## 2016-05-09 NOTE — Patient Outreach (Signed)
[name]   05/09/2016  Holly Page May 09, 1936 [id number]  Holly Page is an 80 y.o. female  Subjective:  COPD: Pt able to indicate she is in the GREEN zone on the action plan and continues to take her inhalers and nebulizer treatments as prescribed with no issues or problems. Pt able to recognize and mention when she should contact her provider with severe distressful breathing or symptoms of COPD distress.  MEDICATIONS: Pt verified she is taking all her medications as prescribed with no problems MEDICAL APPOINTMENTS: Pt reports she is attending all medical appointments with her son as transportation source. Pt aware of the [facility] services and verified she has the contact number from last month's appointment if needed.  NUTRITIONAL: Pt reports ongoing weight loss and her provider is aware. Pt states she continues to tolerate her meals and usually takes supplements but to expensive. Pt trying to increase her eating habits but continues to lose weight reporting today's weight at 106 lbs. Pt indicates she does not wish to lose any additional weight and will obtain nutritional supplements (Ensure) -coupons provided.  Pt has agreed to ongoing [facility] services at this time but prefers to have a telephone assessment call next month and will decide on ongoing services with a health coach at that time.  Objective:   Review of Systems  Constitutional: Positive for weight loss.  HENT: Negative.   Eyes: Negative.   Respiratory: Negative.   Cardiovascular: Negative.   Gastrointestinal: Negative.   Genitourinary: Negative.   Musculoskeletal: Negative.   Skin: Negative.   Neurological: Negative.   Endo/Heme/Allergies: Negative.   Psychiatric/Behavioral: Negative.   All other systems reviewed and are negative.   Physical Exam  Constitutional: She is oriented to person, place, and time. She appears well-developed and well-nourished.  HENT:  Right Ear:  External ear normal.  Left Ear: External ear normal.  Eyes: EOM are normal.  Neck: Normal range of motion. Neck supple.  Cardiovascular: Normal heart sounds.   Respiratory: Effort normal and breath sounds normal.  GI: Soft. Bowel sounds are normal.  Musculoskeletal: Normal range of motion.  Neurological: She is alert and oriented to person, place, and time.  Skin: Skin is warm and dry.  Psychiatric: She has a normal mood and affect. Her behavior is normal. Judgment and thought content normal.    Encounter Medications:   Outpatient Encounter Prescriptions as of 05/09/2016  Medication Sig Note  . acetaminophen-codeine (TYLENOL #3) 300-30 MG tablet Take 1-2 tablets by mouth every 8 (eight) hours as needed for moderate pain or severe pain.   [name] albuterol (PROVENTIL HFA;VENTOLIN HFA) 108 (90 Base) MCG/ACT inhaler Inhale 2 puffs into the lungs every 6 (six) hours as needed for wheezing or shortness of breath.   [name] albuterol (PROVENTIL) (2.5 MG/3ML) 0.083% nebulizer solution TAKE 3 ML'S BY NEBULIZATION EVERY 6 HOURS AS NEEDED FOR SHORTNESS OF BREATH AND AS NEEDED   . ALPRAZolam (XANAX) 0.25 MG tablet Take 1/2 - 1 tablet by mouth daily as needed   . budesonide (PULMICORT) 0.25 MG/2ML nebulizer solution Take 2 mLs (0.25 mg total) by nebulization 2 (two) times daily. DX: J43.9   . cholecalciferol (VITAMIN D) 1000 UNITS tablet Take 1,000 Units by mouth daily.   . feeding supplement (BOOST / RESOURCE BREEZE) LIQD Take 1 Container by mouth 3 (three) times daily between meals.   . Ferrous Sulfate (IRON SLOW RELEASE) 143 (45 Fe) MG TBCR Take by mouth daily.   . Multiple  Vitamins-Minerals (MULTIVITAMIN & MINERAL PO) Take 1 tablet by mouth daily.   . OXYGEN Inhale 3 L/hr into the lungs.   . potassium chloride SA (K-DUR,KLOR-CON) 20 MEQ tablet Take 1 tablet (20 mEq total) by mouth daily.   . ranitidine (ZANTAC) 300 MG tablet Take 1 tablet (300 mg total) by mouth at bedtime. 1 tablet at bedtime   . sodium  chloride (OCEAN) 0.65 % SOLN nasal spray Place 2 sprays into both nostrils as needed for congestion. Reported on 12/31/2015   . solifenacin (VESICARE) 5 MG tablet Take 5 mg by mouth daily. Pt does not take daily (takes PRN)   . verapamil (CALAN-SR) 240 MG CR tablet Take 2 tablets (480 mg total) by mouth daily. (Patient taking differently: Take 480 mg by mouth daily. Alternate between [1] daily and [2] daily) 03/19/2016: Patient took two pills today (6/21) and will need to take one pill tomorrow (6/22)  . fluticasone furoate-vilanterol (BREO ELLIPTA) 100-25 MCG/INH AEPB Inhale 1 puff into the lungs daily. (Patient not taking: Reported on 04/10/2016)    No facility-administered encounter medications on file as of 05/09/2016.     Functional Status:   In your present state of health, do you have any difficulty performing the following activities: 04/29/2016 04/07/2016  Hearing? N N  Vision? N N  Difficulty concentrating or making decisions? N N  Walking or climbing stairs? N Y  Dressing or bathing? N N  Doing errands, shopping? N N  Preparing Food and eating ? N N  Using the Toilet? N N  In the past six months, have you accidently leaked urine? N N  Do you have problems with loss of bowel control? N N  Managing your Medications? N N  Managing your Finances? N N  Housekeeping or managing your Housekeeping? Y N  Some recent data might be hidden   BP 128/78 (BP Location: Left Arm, Patient Position: Sitting, Cuff Size: Normal)   Pulse 75   Resp 20   Ht 1.651 m ('5\' 5"' )   Wt 106 lb (48.1 kg)   SpO2 98%   BMI 17.64 kg/m  Fall/Depression Screening:    PHQ 2/9 Scores 04/29/2016 04/07/2016 12/31/2015 09/18/2014  PHQ - 2 Score 0 0 0 1  Exception Documentation - - Patient refusal -    Assessment:   Follow up on case management related to COPD Follow up on adherence related to medications Follow up on adherence related to medical appointments Nutritional issues related to poor appteite  Plan:  Will  completed a physical assessment with no acute findings found today. Will review the COPD action plan and verified pt remains in the GREEN zone with no reported issues with breathing. Will verify pt is aware of the GREEN zones signs and symptoms and what to do if acute symptoms should occur.  Will verify pt is administering all prescribed and OTC medications as recommended with no reported issues (GOAL MET) Will verify pt continues to attend all scheduled medical appointments with no delays and has sufficient transportation available (GOAL MET). Will discussed pt's nutritional issues and eating habits and supplied pt with nutritional supplements (Ensure) coupons to supplement her meals when her appetite is poor. Discussed maintaining and/or increasing her weight over the next month with increased appetite and healthy food sources. Plan of care discussed with goals along with new goals to accomodates pt's ongoing management of care. Will follow up next month as pt requested a telephone assessment and will decide at that time if  she wishes to continue telephonic services via health coach (discussed today) or possible discharge if pt feels she is able to continue managing her care. No additional inquires or request at this time.  [name], RN Care Management Coordinator [place] Office [phone]

## 2016-05-12 ENCOUNTER — Ambulatory Visit (INDEPENDENT_AMBULATORY_CARE_PROVIDER_SITE_OTHER): Payer: Medicare Other | Admitting: Family Medicine

## 2016-05-12 ENCOUNTER — Encounter: Payer: Self-pay | Admitting: Family Medicine

## 2016-05-12 VITALS — BP 121/61 | HR 72 | Temp 98.2°F | Ht 65.0 in | Wt 105.6 lb

## 2016-05-12 DIAGNOSIS — M25529 Pain in unspecified elbow: Secondary | ICD-10-CM

## 2016-05-12 DIAGNOSIS — J449 Chronic obstructive pulmonary disease, unspecified: Secondary | ICD-10-CM | POA: Diagnosis not present

## 2016-05-12 DIAGNOSIS — M549 Dorsalgia, unspecified: Secondary | ICD-10-CM

## 2016-05-12 DIAGNOSIS — G8929 Other chronic pain: Secondary | ICD-10-CM

## 2016-05-12 DIAGNOSIS — R109 Unspecified abdominal pain: Secondary | ICD-10-CM

## 2016-05-12 MED ORDER — ACETAMINOPHEN-CODEINE #3 300-30 MG PO TABS: 1.0000 | ORAL_TABLET | Freq: Three times a day (TID) | ORAL | 0 refills | Status: DC | PRN

## 2016-05-12 NOTE — Progress Notes (Signed)
Pre visit review using our clinic review tool, if applicable. No additional management support is needed unless otherwise documented below in the visit note. 

## 2016-05-12 NOTE — Progress Notes (Signed)
Holly Page at [facility] [address], [place], [place] [address] [phone]  Date:  05/12/2016   Name:  Holly Page   DOB:  26-Jun-1936   MRN:  [id number]  PCP:  Holly Blinks, MD    Chief Complaint: Abdominal Pain (c/o still having abd pain, swelling in both arm, sore and tightness in both hands. )   History of Present Illness:  Holly Page is a 80 y.o. very pleasant female patient who presents with the following:  History of COPD on chronic oxygen. Here today with complaint of "everything, I have everything wrong with me."  She points out a swelling in both elbows (that I am not able to appreciate), states that her elbow pain will wake her up/ keep her awake at night, and that she will wake up with tingling in her fingers  She also notes that she has had abdominal pain that has been present for approx 9 months. She saw GI (Dr. Dorrene Page with [facility]) and had a virtual colonoscopy recently that was normal.   States that he now wants to do an MRI to try and find the reason for her abdominal pain.    "I'mm just trying to find answers because I have had a miserable, miserable, miserable year."    She saw her GI last week- they went over her colonoscopy results and they planned to do an MRI as the next step of her abd pain evaluation.  She also has microhematuria and is seeing urology. She states that "they won't operate on me because of my breathing," I advised pt that I agree she is a very poor surgical candidate  She is taking tylenol #3 when needed for pain- would like a refill. She brings with her a bottle of this dated January of this year.  The only entry in [facility] is #30 of xanax 0.25 that she got in March of this year  States that she had blood drawn per GI last week.   Asked about anxiety concerning end of life- she denies feeling anxious  States that she is now able to take codeine and is not allergic to it  as she was in the past She is wearing O2 at 3-4L 24/7 Wt Readings from Last 3 Encounters:  05/12/16 105 lb 9.6 oz (47.9 kg)  05/09/16 106 lb (48.1 kg)  04/10/16 106 lb (48.1 kg)     Patient Active Problem List   Diagnosis Date Noted  . Chronic respiratory failure ([place]) 04/10/2016  . Chronic obstructive pulmonary disease with acute exacerbation ([place])   . COPD with exacerbation ([place]) 03/19/2016  . RBC microcytosis 03/19/2016  . COPD with acute exacerbation ([place]) 03/19/2016  . Oxygen dependent 12/31/2015  . Anxiety 11/08/2015  . Hyperthyroidism 09/07/2015  . Malnutrition of moderate degree 08/31/2015  . Acute on chronic respiratory failure with hypoxia ([place]) 04/19/2015  . Abnormal LFTs 03/19/2015  . Back pain 03/19/2015  . HTN (hypertension) 03/01/2015  . Anemia 12/19/2014  . Calculus of kidney 08/30/2014  . Hematuria 08/11/2014  . History of DVT (deep vein thrombosis)   . Neuralgia neuritis, sciatic nerve 05/22/2014  . 1St degree AV block 04/24/2014  . History of colon polyps 04/24/2014  . Adaptive colitis 04/24/2014  . APC (atrial premature contractions) 04/24/2014  . Benign neoplasm of meninges ([place]) 02/10/2014  . Barrett esophagus 02/03/2014  . Spinal stenosis 12/15/2013  . Allergic rhinitis 11/21/2013  . COPD with emphysema  Gold D   . GERD (gastroesophageal reflux disease)   . Lactose intolerance   . DDD (degenerative disc disease), lumbosacral 06/11/2010  . Diffuse cerebrovascular disease 06/11/2010    Past Medical History:  Diagnosis Date  . COPD (chronic obstructive pulmonary disease) ([place])   . DVT (deep venous thrombosis) ([place]) 1980s, recurrent 2015   Right DVT 2015  on xarelto  . GERD (gastroesophageal reflux disease)   . Hypertension   . Lactose intolerance   . Meningioma ([place])    followed by Dr Holly Page  . Pneumonia   . Spinal stenosis     Past Surgical History:  Procedure Laterality Date  . CHOLECYSTECTOMY    . FOOT SURGERY    . TONSILLECTOMY     . TUBAL LIGATION      Social History  Substance Use Topics  . Smoking status: Former Smoker    Packs/day: 1.50    Years: 56.00    Types: Cigarettes    Quit date: 04/29/2012  . Smokeless tobacco: Never Used     Comment: quit smoking 3 years ago  . Alcohol use No     Comment: quit drinking beer 2005    Family History  Problem Relation Age of Onset  . COPD Father     smoker deceased.   . Lupus Sister   . Heart attack Son     Allergies  Allergen Reactions  . Incruse Ellipta [Umeclidinium Bromide] Shortness Of Breath  . Tramadol Shortness Of Breath  . Amlodipine     Other reaction(s): SWELLING  . Aspirin     Other reaction(s): PALPITATIONS Other reaction(s): DIFFICULTY BREATHING Heart flutter  . Codeine Other (See Comments)    Hallucinations, can take Tylenol #3 now  . Doxycycline     Other reaction(s): NAUSEA,VOMITING  . Hydrocodone     hallucinations  . Losartan     unknown  . Propoxyphene Nausea Only    Other reaction(s): NAUSEA  . Gabapentin     Other reaction(s): Other (See Comments) She could not swallow the large pill  . Tiotropium Itching and Rash    Medication list has been reviewed and updated.  Current Outpatient Prescriptions on File Prior to Visit  Medication Sig Dispense Refill  . acetaminophen-codeine (TYLENOL #3) 300-30 MG tablet Take 1-2 tablets by mouth every 8 (eight) hours as needed for moderate pain or severe pain. 60 tablet 0  . albuterol (PROVENTIL HFA;VENTOLIN HFA) 108 (90 Base) MCG/ACT inhaler Inhale 2 puffs into the lungs every 6 (six) hours as needed for wheezing or shortness of breath. 1 Inhaler 5  . albuterol (PROVENTIL) (2.5 MG/3ML) 0.083% nebulizer solution TAKE 3 ML'S BY NEBULIZATION EVERY 6 HOURS AS NEEDED FOR SHORTNESS OF BREATH AND AS NEEDED 375 mL 11  . ALPRAZolam (XANAX) 0.25 MG tablet Take 1/2 - 1 tablet by mouth daily as needed 30 tablet 0  . budesonide (PULMICORT) 0.25 MG/2ML nebulizer solution Take 2 mLs (0.25 mg total) by  nebulization 2 (two) times daily. DX: J43.9 120 mL 12  . cholecalciferol (VITAMIN D) 1000 UNITS tablet Take 1,000 Units by mouth daily.    . feeding supplement (BOOST / RESOURCE BREEZE) LIQD Take 1 Container by mouth 3 (three) times daily between meals. 21 Container 0  . Ferrous Sulfate (IRON SLOW RELEASE) 143 (45 Fe) MG TBCR Take by mouth daily.    . fluticasone furoate-vilanterol (BREO ELLIPTA) 100-25 MCG/INH AEPB Inhale 1 puff into the lungs daily. 1 each 6  . Multiple Vitamins-Minerals (MULTIVITAMIN & MINERAL  PO) Take 1 tablet by mouth daily.    . OXYGEN Inhale 3 L/hr into the lungs.    . potassium chloride SA (K-DUR,KLOR-CON) 20 MEQ tablet Take 1 tablet (20 mEq total) by mouth daily. 90 tablet 2  . ranitidine (ZANTAC) 300 MG tablet Take 1 tablet (300 mg total) by mouth at bedtime. 1 tablet at bedtime 90 tablet 2  . sodium chloride (OCEAN) 0.65 % SOLN nasal spray Place 2 sprays into both nostrils as needed for congestion. Reported on 12/31/2015    . solifenacin (VESICARE) 5 MG tablet Take 5 mg by mouth daily. Pt does not take daily (takes PRN)    . verapamil (CALAN-SR) 240 MG CR tablet Take 2 tablets (480 mg total) by mouth daily. (Patient taking differently: Take 480 mg by mouth daily. Alternate between [1] daily and [2] daily) 60 tablet 5   No current facility-administered medications on file prior to visit.     Review of Systems:  As per HPI- otherwise negative. She does use tylenol #3 as needed for pain- brings with her a bottle today.   No fever, chills.  Appetite is good    Physical Examination: Vitals:   05/12/16 1112  BP: 121/61  Pulse: 72  Temp: 98.2 F (36.8 C)   Vitals:   05/12/16 1112  Weight: 105 lb 9.6 oz (47.9 kg)  Height: 5\' 5"  (1.651 m)   Body mass index is 17.57 kg/m. Ideal Body Weight: Weight in (lb) to have BMI = 25: 149.9  GEN: WDWN, NAD, Non-toxic, A & O x 3, thin, wearing oxygen HEENT: Atraumatic, Normocephalic. Neck supple. No masses, No LAD. Ears  and Nose: No external deformity. CV: RRR, No M/G/R. No JVD. No thrill. No extra heart sounds. PULM: CTA B, no wheezes, crackles, rhonchi. No retractions. No resp. distress. No accessory muscle use. ABD: S, NT, ND, +BS. No rebound. No HSM. EXTR: No c/c/e NEURO uses wheelchair due to her end stage COPD PSYCH: Normally interactive. Conversant. Not depressed or anxious appearing.  Calm demeanor.  Examined both of her elbows- I cannot appreciate any swelling or other abnl of the joints .  She has normal ROM to flex/ex/supinate/ pronate of both elbows.  No heat, redness or lesion.    Assessment and Plan: Elbow pain, chronic, unspecified laterality  Back pain with radiation - Plan: acetaminophen-codeine (TYLENOL #3) 300-30 MG tablet  End stage COPD (HCC)  Chronic abdominal pain  Refilled her tylneol #3 to use as needed for pain. Reassured her that I do not see any swelling or mass of her elbows Explained that I do not know why she has the chronic abd pain. It has been present for over 6 months and she is currently undergoing a GI evaluation.  Encouraged her to follow-up with GI as planned Again I suspect that a lot of her sx are due to anxiety, and she does seem to have a poor grasp on the severity of her COPD disease. However at this time she denies being anxious  Plan to recheck in 4-6 weeks to eval her progress    Signed Holly Blinks, MD

## 2016-05-12 NOTE — Patient Instructions (Signed)
It was good to see you today I am sorry that you are feeling bad

## 2016-05-15 ENCOUNTER — Encounter: Payer: Self-pay | Admitting: Adult Health

## 2016-05-15 ENCOUNTER — Ambulatory Visit (INDEPENDENT_AMBULATORY_CARE_PROVIDER_SITE_OTHER): Payer: Medicare Other | Admitting: Adult Health

## 2016-05-15 DIAGNOSIS — J439 Emphysema, unspecified: Secondary | ICD-10-CM

## 2016-05-15 MED ORDER — AZITHROMYCIN 250 MG PO TABS: ORAL_TABLET | ORAL | 0 refills | Status: AC

## 2016-05-15 MED ORDER — PREDNISONE 10 MG PO TABS: ORAL_TABLET | ORAL | 0 refills | Status: DC

## 2016-05-15 MED ORDER — FLUTICASONE FUROATE-VILANTEROL 100-25 MCG/INH IN AEPB: 1.0000 | INHALATION_SPRAY | Freq: Every day | RESPIRATORY_TRACT | 0 refills | Status: AC

## 2016-05-15 NOTE — Progress Notes (Signed)
Subjective:    Patient ID: Holly Page, female    DOB: June 01, 1936, 80 y.o.   MRN: [id number]  HPI  80 y.o. F with Gold D Copd primary emphysema on O2 >GOLD card patient  Hx of DVT -05/2014 (tx w/ xarelto until 12/2014 ) -followed by hematology  Left arm superficial venous thrombus , Xarelto 01/25/15 >followed by Hematology >stopped 07/2015  CT chest, on March 19 2015 >that showed no evidence of pulmonary embolism  and stable. Severe COPD changes. Last spirometry showed FEV1 at 30% in 2015.  She has been tried on [place] but was unable to tolerate. Oxygen does drop with pulsing O2 on 2 L was able to keep above 90% on pulsing 3 L. Patient says she has been evaluated by cardiology with a negative stress test recently.    05/15/2016   Follow up :   : COPD /O2 dependent  Pt presents for an acute office visit.  Complains of 1 week of cough,congestion, wheezing and shortness of breath.  Feels mucus is very thick , hard to get up.  Currently on BREO and Pulmicort. Along with albuterol nebs Four times a day   She has tried several inhalers and nebs in past., has several intolerances.  Has been off daily prednisone since end of June 2017.  Got her son to drive her today to office, she usually drives .  She is in wc today , usually walks in office.   Patient denies any hemoptysis, orthopnea, PND or chest pain. Prevnar/PVX are utd.  She remains somewhat indepndent, lives alone and drives. Gets meals on wheels , 1 meal daily for 5 days.  Denies chest pain, orthopnea, edema or fever.   Past Medical History:  Diagnosis Date  . COPD (chronic obstructive pulmonary disease) ([place])   . DVT (deep venous thrombosis) ([place]) 1980s, recurrent 2015   Right DVT 2015  on xarelto  . GERD (gastroesophageal reflux disease)   . Hypertension   . Lactose intolerance   . Meningioma ([place])    followed by Dr Christella Noa  . Pneumonia   . Spinal stenosis    Current Outpatient  Prescriptions on File Prior to Visit  Medication Sig Dispense Refill  . acetaminophen-codeine (TYLENOL #3) 300-30 MG tablet Take 1-2 tablets by mouth every 8 (eight) hours as needed for moderate pain or severe pain. 60 tablet 0  . albuterol (PROVENTIL HFA;VENTOLIN HFA) 108 (90 Base) MCG/ACT inhaler Inhale 2 puffs into the lungs every 6 (six) hours as needed for wheezing or shortness of breath. 1 Inhaler 5  . albuterol (PROVENTIL) (2.5 MG/3ML) 0.083% nebulizer solution TAKE 3 ML'S BY NEBULIZATION EVERY 6 HOURS AS NEEDED FOR SHORTNESS OF BREATH AND AS NEEDED 375 mL 11  . ALPRAZolam (XANAX) 0.25 MG tablet Take 1/2 - 1 tablet by mouth daily as needed 30 tablet 0  . budesonide (PULMICORT) 0.25 MG/2ML nebulizer solution Take 2 mLs (0.25 mg total) by nebulization 2 (two) times daily. DX: J43.9 120 mL 12  . cholecalciferol (VITAMIN D) 1000 UNITS tablet Take 1,000 Units by mouth daily.    . feeding supplement (BOOST / RESOURCE BREEZE) LIQD Take 1 Container by mouth 3 (three) times daily between meals. 21 Container 0  . Ferrous Sulfate (IRON SLOW RELEASE) 143 (45 Fe) MG TBCR Take by mouth daily.    . Multiple Vitamins-Minerals (MULTIVITAMIN & MINERAL PO) Take 1 tablet by mouth daily.    . OXYGEN Inhale 3 L/hr  into the lungs.    . potassium chloride SA (K-DUR,KLOR-CON) 20 MEQ tablet Take 1 tablet (20 mEq total) by mouth daily. 90 tablet 2  . ranitidine (ZANTAC) 300 MG tablet Take 1 tablet (300 mg total) by mouth at bedtime. 1 tablet at bedtime 90 tablet 2  . sodium chloride (OCEAN) 0.65 % SOLN nasal spray Place 2 sprays into both nostrils as needed for congestion. Reported on 12/31/2015    . solifenacin (VESICARE) 5 MG tablet Take 5 mg by mouth daily. Pt does not take daily (takes PRN)    . verapamil (CALAN-SR) 240 MG CR tablet Take 2 tablets (480 mg total) by mouth daily. (Patient taking differently: Take 480 mg by mouth daily. Alternate between [1] daily and [2] daily) 60 tablet 5  . fluticasone  furoate-vilanterol (BREO ELLIPTA) 100-25 MCG/INH AEPB Inhale 1 puff into the lungs daily. (Patient not taking: Reported on 05/15/2016) 1 each 6   No current facility-administered medications on file prior to visit.      Review of Systems  Constitutional:   No  weight loss, night sweats,  Fevers, chills, + fatigue, or  lassitude.  HEENT:   No headaches,  Difficulty swallowing,  Tooth/dental problems, or  Sore throat,                No sneezing, itching, ear ache, nasal congestion, post nasal drip,   CV:  No chest pain,  Orthopnea, PND, swelling in lower extremities, anasarca, dizziness, palpitations, syncope.   GI  Notes heartburn, no indigestion, abdominal pain, nausea, vomiting, diarrhea, change in bowel habits, loss of appetite, bloody stools.   Resp:  .    No chest wall deformity  Skin: no rash or lesions.  GU: no dysuria, change in color of urine, no urgency or frequency.  No flank pain, no hematuria   MS:  No joint pain or swelling.  No decreased range of motion.  No back pain.  Psych:  No change in mood or affect.+anxiety.  No memory loss.      Objective:   Physical Exam Vitals:   05/15/16 1003  BP: 124/78  Pulse: 73  Temp: 98 F (36.7 C)  TempSrc: Oral  SpO2: 96%  Weight: 103 lb (46.7 kg)  Height: 5\' 5"  (1.651 m)     GEN: A/Ox3; pleasant , NAD, elderly and frail , on O2 , anxious , in wc today    HEENT:  [place]/AT,  EACs-clear, TMs-wnl, NOSE-clear, THROAT-clear, no lesions  NECK:  Supple w/ fair ROM; no JVD; normal carotid impulses w/o bruits; no thyromegaly or nodules palpated; no lymphadenopathy.    RESP  Diminished in bases w/ no wheezing or rhonchi. no accessory muscle use, no dullness to percussion  CARD:  RRR, no m/r/g  , no peripheral edema, pulses intact, no cyanosis or clubbing.  GI:   Soft & nt; nml bowel sounds; no organomegaly or masses detected.   Musco: Warm bil, no deformities or joint swelling noted.   Neuro: alert, no focal deficits noted.   Anxious   Skin: Warm, no lesions or rashes .   Ruqayyah Lute NP-C  [facility]  05/15/2016

## 2016-05-15 NOTE — Patient Instructions (Addendum)
Zpack take as directed.  Prednisone taper over next week.  Continue on BREO 1 puff daily , rinse after use.   Continue on Albuterol Neb Four times a day  .  Continue on Pulmicort Neb Twice daily  .  Continue on Oxygen 4l/m with activity and 3l/m at rest .  Stress reducers with breathing exercises as we discussed.  Follow up in 4 weeks as planned and As needed   Please contact office for sooner follow up if symptoms do not improve or worsen or seek emergency care

## 2016-05-15 NOTE — Assessment & Plan Note (Signed)
Flare   Plan  Patient Instructions  Zpack take as directed.  Prednisone taper over next week.  Continue on BREO 1 puff daily , rinse after use.   Continue on Albuterol Neb Four times a day  .  Continue on Pulmicort Neb Twice daily  .  Continue on Oxygen 4l/m with activity and 3l/m at rest .  Stress reducers with breathing exercises as we discussed.  Follow up in 4 weeks as planned and As needed   Please contact office for sooner follow up if symptoms do not improve or worsen or seek emergency care

## 2016-05-15 NOTE — Addendum Note (Signed)
Addended by: Mathis Dad on: 05/15/2016 11:03 AM   Modules accepted: Orders

## 2016-05-16 ENCOUNTER — Telehealth: Payer: Self-pay | Admitting: Adult Health

## 2016-05-16 NOTE — Telephone Encounter (Signed)
Pt states that she was given a ZPak and she did not take this as directed. Pt started at the end of the pack and took 2 tablets (day 5 and 6) on the first day. Pt wanting to make sure that she didn't mess anything up by mixing up the days in the packet. I explained that as long as she took 2 tablets the first day then she was gotten to correct dose and she will just need to take 1 tablet daily until they are gone. Pt repeated instructions back to me as to how she needed to take her remaining tablets - pt seemed to understand that from here on out until all tablets are gone that she is to ONLY take 1 tablet despite what the packet says. Nothing further needed.

## 2016-05-18 IMAGING — US US ABDOMEN COMPLETE
1 series · 13 of 25 positions shown · non-contrast
Comparison: MRI of the abdomen of January 28, 2008

CLINICAL DATA: History of elevated liver function studies,
cholecystectomy, common bile duct dilation, liver cyst.

EXAM:
ULTRASOUND ABDOMEN COMPLETE

[Series 1: us abdomen complete · 0.12mm/px · 13 of 99 slices shown]
[im 1/99]
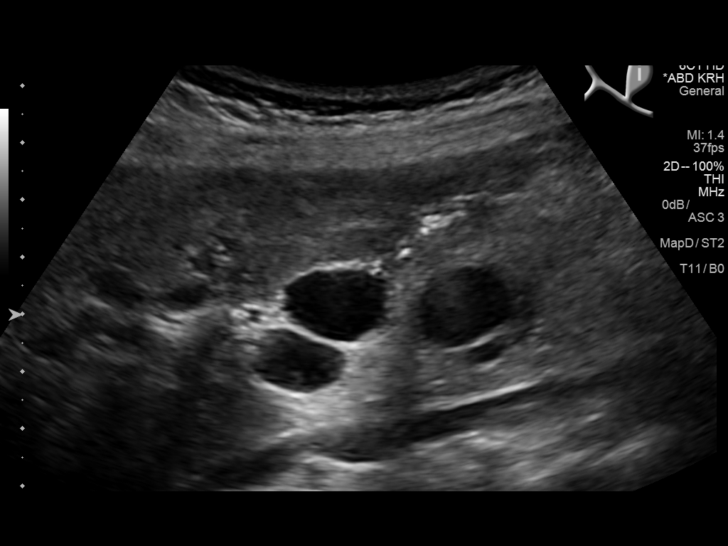
[im 9/99]
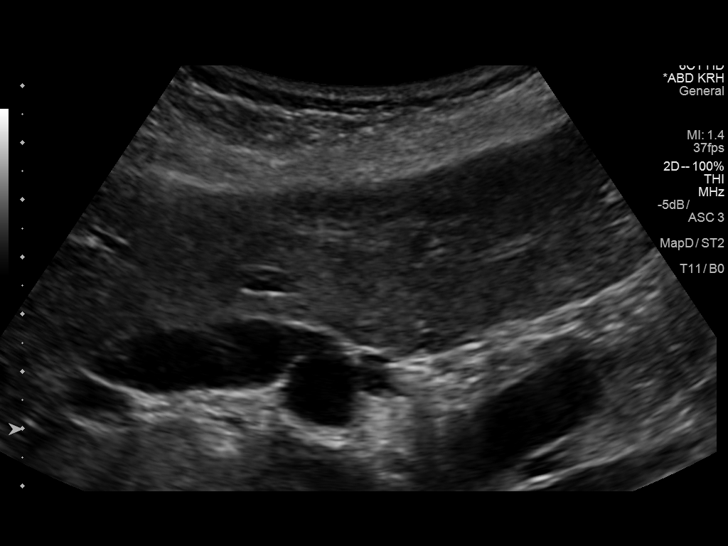
[im 17/99]
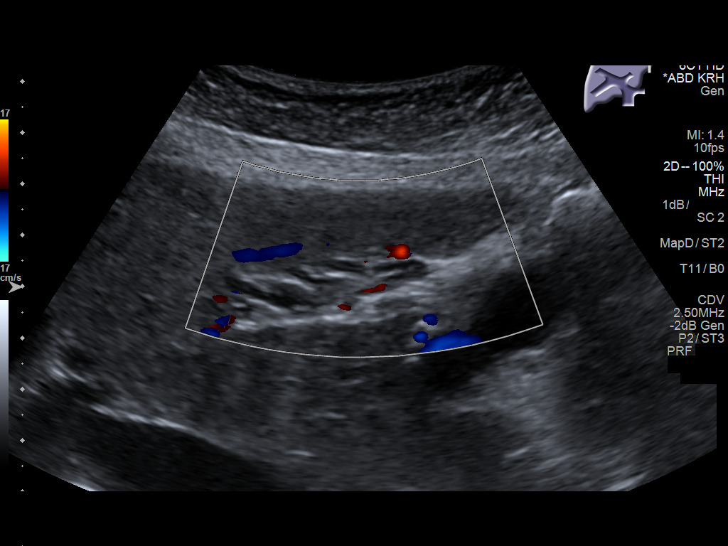
[im 25/99]
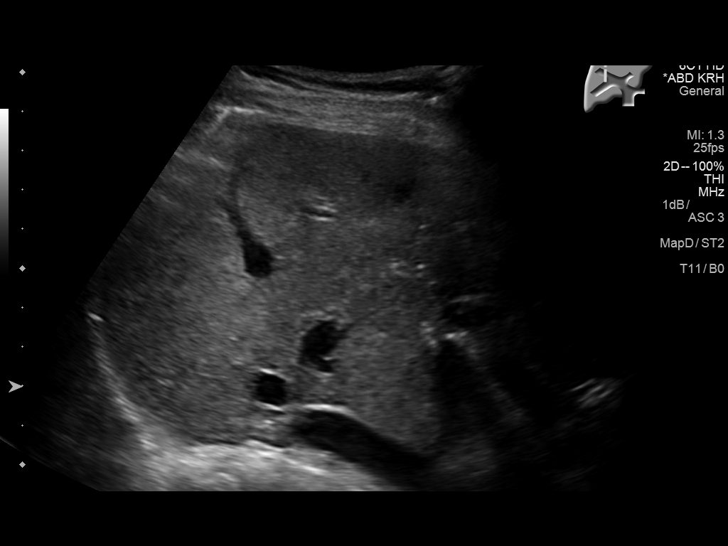
[im 33/99]
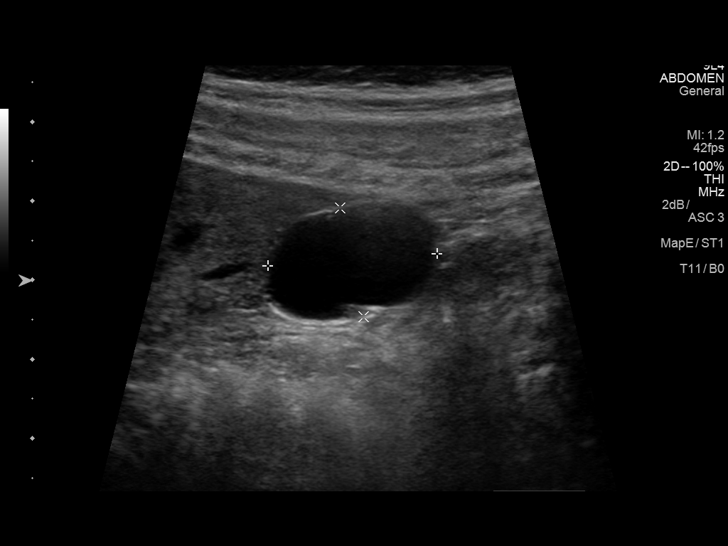
[im 41/99]
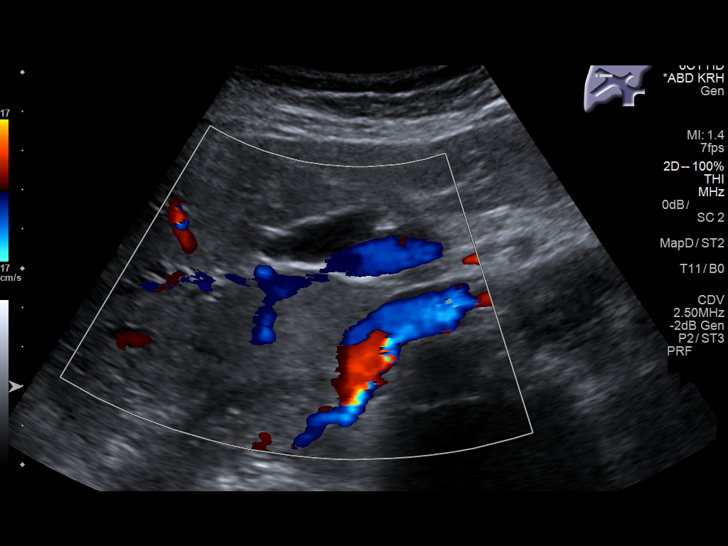
[im 50/99]
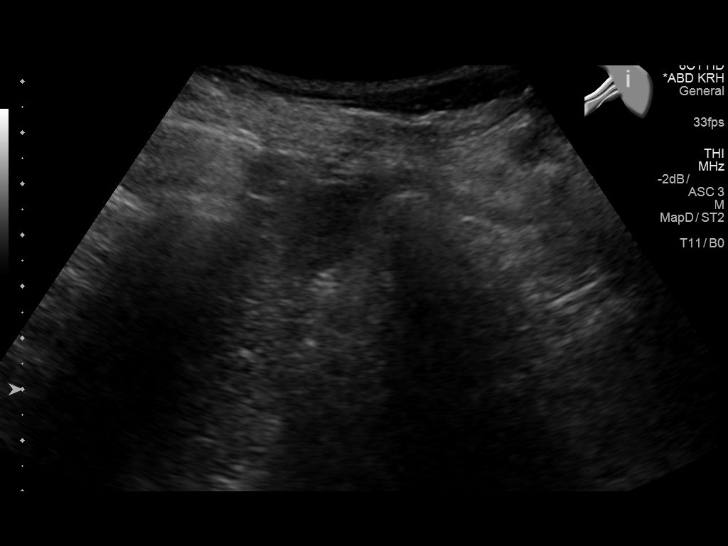
[im 58/99]
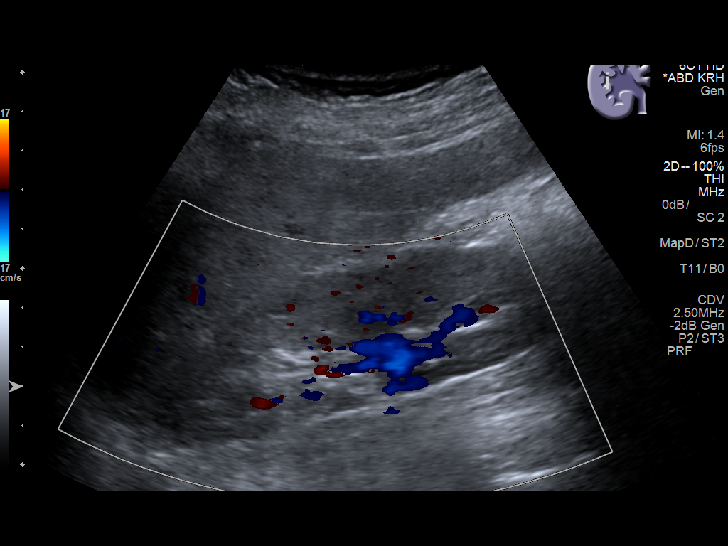
[im 66/99]
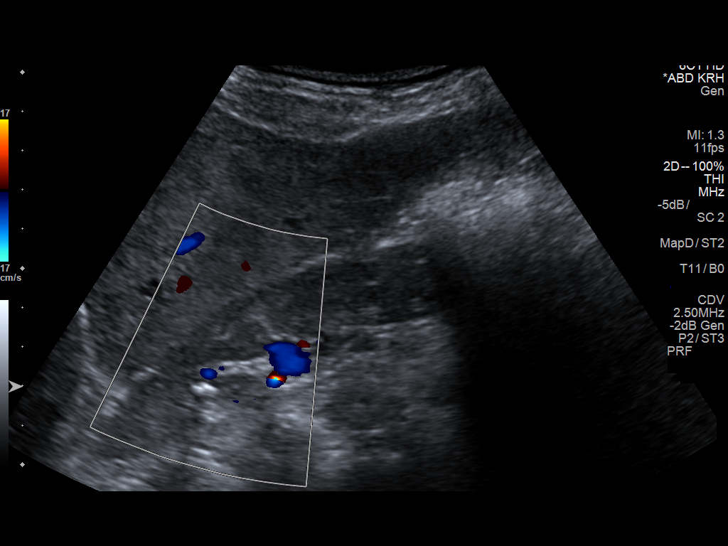
[im 74/99]
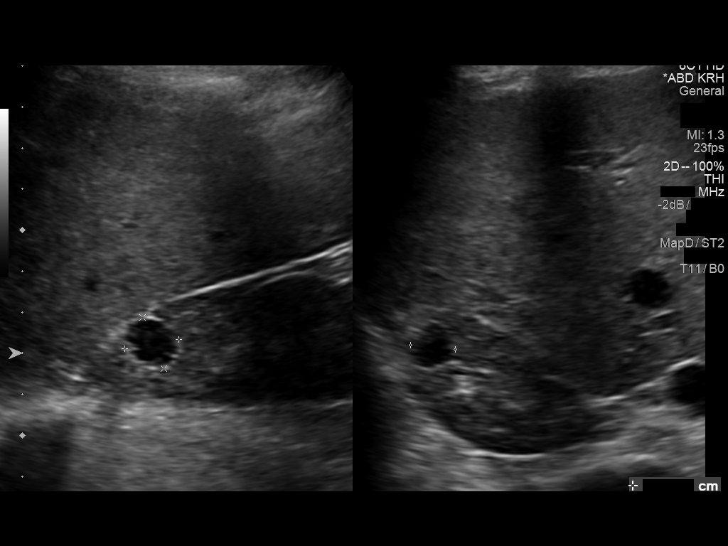
[im 82/99]
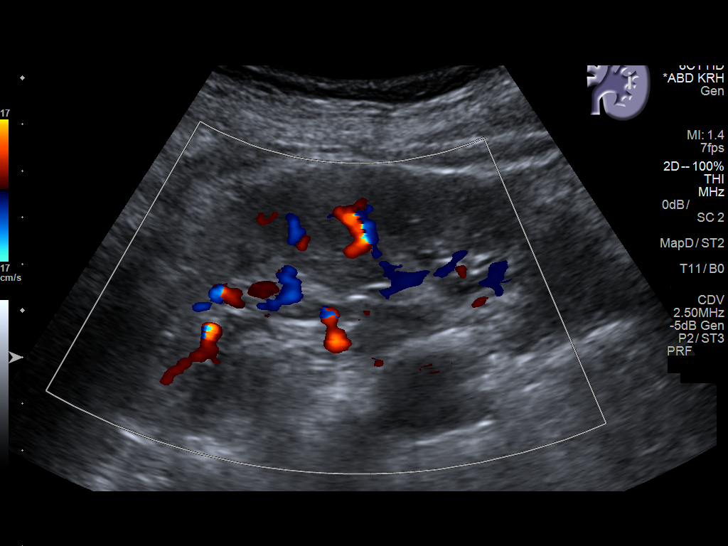
[im 90/99]
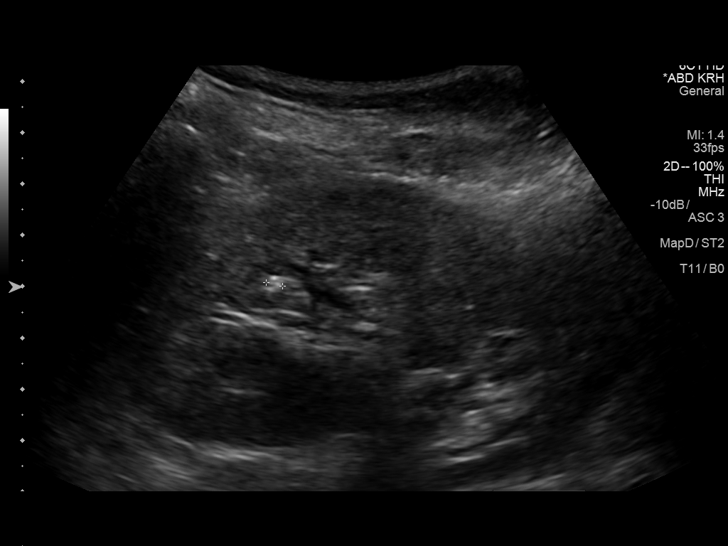
[im 99/99]
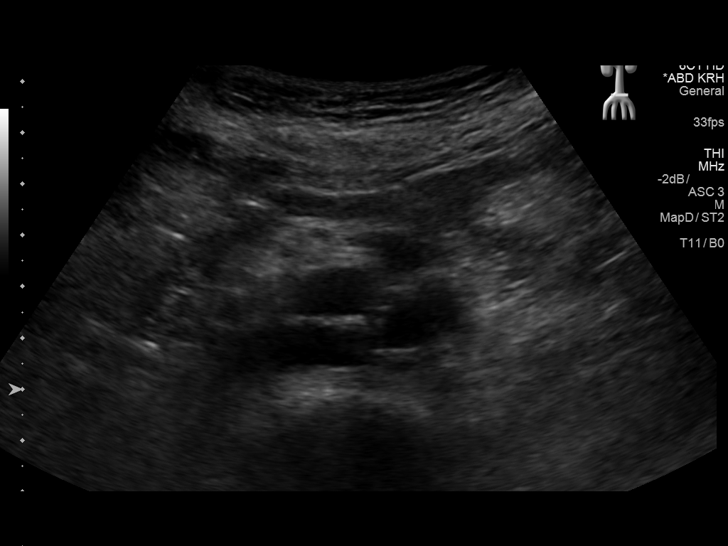

[13 of 25 positions shown; findings below may reference images not displayed]

FINDINGS: Gallbladder: The gallbladder is surgically absent.

Common bile duct: Diameter: Dilated to between 10 and 16 mm. No
intraluminal stones.

Liver: There is intrahepatic ductal dilation. The hepatic
echotexture is heterogeneous. There is no discrete mass. There is a
cyst in the right hepatic lobe posteriorly measuring 2.1 x 1.4 x
cm.

IVC: Evaluation of the IVC is limited by bowel gas.

Pancreas: Evaluation of the pancreatic parenchyma is limited. There
is dilation of the pancreatic duct nearly 5 mm.

Spleen: Valuation spleen is limited but it is not enlarged.

Right Kidney: Length: 11.5 cm.. In the upper pole there are two
cystic structures. The largest measures 1.4 cm while the smaller
measures 1.1 cm in greatest dimension. Punctate echogenic foci
measuring up to 7 mm in the upper pole likely reflect nonobstructing
stones or parenchymal calcifications. There no hydronephrosis.

Left Kidney: Length: 11.6 cm. Mild prominence of the renal pelvis.
There are echogenic foci in the upper pole which may reflect
nonobstructing stones. There is mild prominence the renal pelvis.

Abdominal aorta: No aneurysm visualized.

Other findings: None.
IMPRESSION: 1. The gallbladder is surgically absent. There is chronic dilation
of the intrahepatic, common bile duct, and pancreatic duct. There is
no discrete pancreatic mass observed. Evaluation of the pancreas is
limited however. Stable right lobe hepatic cyst
2. Probable bilateral nonobstructing kidney stones. There are upper
pole cysts on the right.

## 2016-05-23 DIAGNOSIS — R109 Unspecified abdominal pain: Secondary | ICD-10-CM | POA: Diagnosis not present

## 2016-05-23 DIAGNOSIS — R935 Abnormal findings on diagnostic imaging of other abdominal regions, including retroperitoneum: Secondary | ICD-10-CM | POA: Diagnosis not present

## 2016-05-23 DIAGNOSIS — R933 Abnormal findings on diagnostic imaging of other parts of digestive tract: Secondary | ICD-10-CM | POA: Diagnosis not present

## 2016-05-26 DIAGNOSIS — J449 Chronic obstructive pulmonary disease, unspecified: Secondary | ICD-10-CM | POA: Diagnosis not present

## 2016-05-29 ENCOUNTER — Ambulatory Visit (INDEPENDENT_AMBULATORY_CARE_PROVIDER_SITE_OTHER): Payer: Medicare Other | Admitting: Adult Health

## 2016-05-29 ENCOUNTER — Encounter: Payer: Self-pay | Admitting: Adult Health

## 2016-05-29 DIAGNOSIS — J439 Emphysema, unspecified: Secondary | ICD-10-CM

## 2016-05-29 DIAGNOSIS — J9611 Chronic respiratory failure with hypoxia: Secondary | ICD-10-CM | POA: Diagnosis not present

## 2016-05-29 MED ORDER — FLUTICASONE FUROATE-VILANTEROL 100-25 MCG/INH IN AEPB: 1.0000 | INHALATION_SPRAY | Freq: Every day | RESPIRATORY_TRACT | 0 refills | Status: DC

## 2016-05-29 NOTE — Progress Notes (Signed)
Subjective:    Patient ID: Holly Page, female    DOB: 06-14-1936, 80 y.o.   MRN: [id number]  HPI 80 y.o. F with Gold D Copd primary emphysema on O2 >GOLD card patient  Hx of DVT -05/2014 (tx w/ xarelto until 12/2014 ) -followed by hematology  Left arm superficial venous thrombus , Xarelto 01/25/15 >followed by Hematology >stopped 07/2015  CT chest, on March 19 2015 >that showed no evidence of pulmonary embolism  and stable. Severe COPD changes. Last spirometry showed FEV1 at 30% in 2015.  She has been tried on [place] but was unable to tolerate. Oxygen does drop with pulsing O2 on 2 L was able to keep above 90% on pulsing 3 L. Patient says she has been evaluated by cardiology with a negative stress test recently.    05/29/2016   Follow up :   : COPD /O2 dependent  Pt presents for a follow up for COPD  Currently on BREO and Pulmicort. Along with albuterol nebs Four times a day   She has tried several inhalers and nebs in past., has several intolerances.  Has been off daily prednisone since end of June 2017.  Seen 2 weeks with COPD flare, tx w/ zpak and steriods.  She is feeling better. More like her baseline.  Improved mood today with less anxiety.  She used wheelchair today again, usually walks in .  Discuss increasing light walking in home to build strength.   Patient denies any hemoptysis, orthopnea, PND or chest pain. Prevnar/PVX are utd.   Gets meals on wheels , 1 meal daily for 5 days.  Denies chest pain, orthopnea, edema or fever.  Declines flu shot .    Past Medical History:  Diagnosis Date  . COPD (chronic obstructive pulmonary disease) ([place])   . DVT (deep venous thrombosis) ([place]) 1980s, recurrent 2015   Right DVT 2015  on xarelto  . GERD (gastroesophageal reflux disease)   . Hypertension   . Lactose intolerance   . Meningioma ([place])    followed by Dr Christella Noa  . Pneumonia   . Spinal stenosis    Current Outpatient Prescriptions on  File Prior to Visit  Medication Sig Dispense Refill  . acetaminophen-codeine (TYLENOL #3) 300-30 MG tablet Take 1-2 tablets by mouth every 8 (eight) hours as needed for moderate pain or severe pain. 60 tablet 0  . albuterol (PROVENTIL HFA;VENTOLIN HFA) 108 (90 Base) MCG/ACT inhaler Inhale 2 puffs into the lungs every 6 (six) hours as needed for wheezing or shortness of breath. 1 Inhaler 5  . albuterol (PROVENTIL) (2.5 MG/3ML) 0.083% nebulizer solution TAKE 3 ML'S BY NEBULIZATION EVERY 6 HOURS AS NEEDED FOR SHORTNESS OF BREATH AND AS NEEDED 375 mL 11  . ALPRAZolam (XANAX) 0.25 MG tablet Take 1/2 - 1 tablet by mouth daily as needed 30 tablet 0  . budesonide (PULMICORT) 0.25 MG/2ML nebulizer solution Take 2 mLs (0.25 mg total) by nebulization 2 (two) times daily. DX: J43.9 120 mL 12  . cholecalciferol (VITAMIN D) 1000 UNITS tablet Take 1,000 Units by mouth daily.    . feeding supplement (BOOST / RESOURCE BREEZE) LIQD Take 1 Container by mouth 3 (three) times daily between meals. 21 Container 0  . Ferrous Sulfate (IRON SLOW RELEASE) 143 (45 Fe) MG TBCR Take by mouth daily.    . fluticasone furoate-vilanterol (BREO ELLIPTA) 100-25 MCG/INH AEPB Inhale 1 puff into the lungs daily. 1 each 6  . Multiple Vitamins-Minerals (  MULTIVITAMIN & MINERAL PO) Take 1 tablet by mouth daily.    . OXYGEN Inhale 3 L/hr into the lungs.    . potassium chloride SA (K-DUR,KLOR-CON) 20 MEQ tablet Take 1 tablet (20 mEq total) by mouth daily. 90 tablet 2  . ranitidine (ZANTAC) 300 MG tablet Take 1 tablet (300 mg total) by mouth at bedtime. 1 tablet at bedtime 90 tablet 2  . sodium chloride (OCEAN) 0.65 % SOLN nasal spray Place 2 sprays into both nostrils as needed for congestion. Reported on 12/31/2015    . solifenacin (VESICARE) 5 MG tablet Take 5 mg by mouth daily. Pt does not take daily (takes PRN)    . verapamil (CALAN-SR) 240 MG CR tablet Take 2 tablets (480 mg total) by mouth daily. (Patient taking differently: Take 480 mg by  mouth daily. Alternate between [1] daily and [2] daily) 60 tablet 5   No current facility-administered medications on file prior to visit.      Review of Systems  Constitutional:   No  weight loss, night sweats,  Fevers, chills, + fatigue, or  lassitude.  HEENT:   No headaches,  Difficulty swallowing,  Tooth/dental problems, or  Sore throat,                No sneezing, itching, ear ache,  CV:  No chest pain,  Orthopnea, PND, swelling in lower extremities, anasarca, dizziness, palpitations, syncope.   GI  Notes heartburn, no indigestion, abdominal pain, nausea, vomiting, diarrhea, change in bowel habits, loss of appetite, bloody stools.   Resp:  .    No chest wall deformity  Skin: no rash or lesions.  GU: no dysuria, change in color of urine, no urgency or frequency.  No flank pain, no hematuria   MS:  No joint pain or swelling.  No decreased range of motion.  No back pain.  Psych:  No change in mood or affect.+anxiety.  No memory loss.      Objective:   Physical Exam Vitals:   05/29/16 1001  BP: 135/77  Pulse: 80  Temp: 98.3 F (36.8 C)  TempSrc: Oral  SpO2: 92%  Weight: 103 lb (46.7 kg)  Height: 5\' 5"  (1.651 m)     GEN: A/Ox3; pleasant , NAD, elderly and frail , on O2 , calmer today , in wc today, smiling more    HEENT:  [place]/AT,  EACs-clear, TMs-wnl, NOSE-clear, THROAT-clear, no lesions  NECK:  Supple w/ fair ROM; no JVD; normal carotid impulses w/o bruits; no thyromegaly or nodules palpated; no lymphadenopathy.    RESP  Diminished in bases w/ no wheezing or rhonchi. no accessory muscle use, no dullness to percussion  CARD:  RRR, no m/r/g  , no peripheral edema, pulses intact, no cyanosis or clubbing.  GI:   Soft & nt; nml bowel sounds; no organomegaly or masses detected.   Musco: Warm bil, no deformities or joint swelling noted.   Neuro: alert, no focal deficits noted.  Anxious   Skin: Warm, no lesions or rashes    Ishmail Mcmanamon NP-C  [facility]  05/29/2016

## 2016-05-29 NOTE — Assessment & Plan Note (Signed)
Cont on O2  POC eval

## 2016-05-29 NOTE — Progress Notes (Signed)
Reviewed & agree with plan  

## 2016-05-29 NOTE — Patient Instructions (Addendum)
Order to DME for evaluation for POC Oxygen device .   Continue on BREO 1 puff daily , rinse after use.   Continue on Albuterol Neb Four times a day  .  Continue on Pulmicort Neb Twice daily  .  Continue on Oxygen 4l/m with activity and 3l/m at rest .  Stress reducers with breathing exercises as we discussed.  Follow up in 2 weeks as planned and As needed   Please contact office for sooner follow up if symptoms do not improve or worsen or seek emergency care

## 2016-05-29 NOTE — Assessment & Plan Note (Signed)
Recent flare now resolved  Plan  Patient Instructions  Order to DME for evaluation for POC Oxygen device .   Continue on BREO 1 puff daily , rinse after use.   Continue on Albuterol Neb Four times a day  .  Continue on Pulmicort Neb Twice daily  .  Continue on Oxygen 4l/m with activity and 3l/m at rest .  Stress reducers with breathing exercises as we discussed.  Follow up in 2 weeks as planned and As needed   Please contact office for sooner follow up if symptoms do not improve or worsen or seek emergency care

## 2016-05-29 NOTE — Addendum Note (Signed)
Addended by: Mathis Dad on: 05/29/2016 11:22 AM   Modules accepted: Orders

## 2016-06-01 IMAGING — CR DG CHEST 2V
2 series · 2 of 2 positions shown · non-contrast
Comparison: December 08, 2014

CLINICAL DATA: Shortness of breath.  Recent pneumonia.  COPD

EXAM:
CHEST  2 VIEW

[view not recorded (1 of 2)]
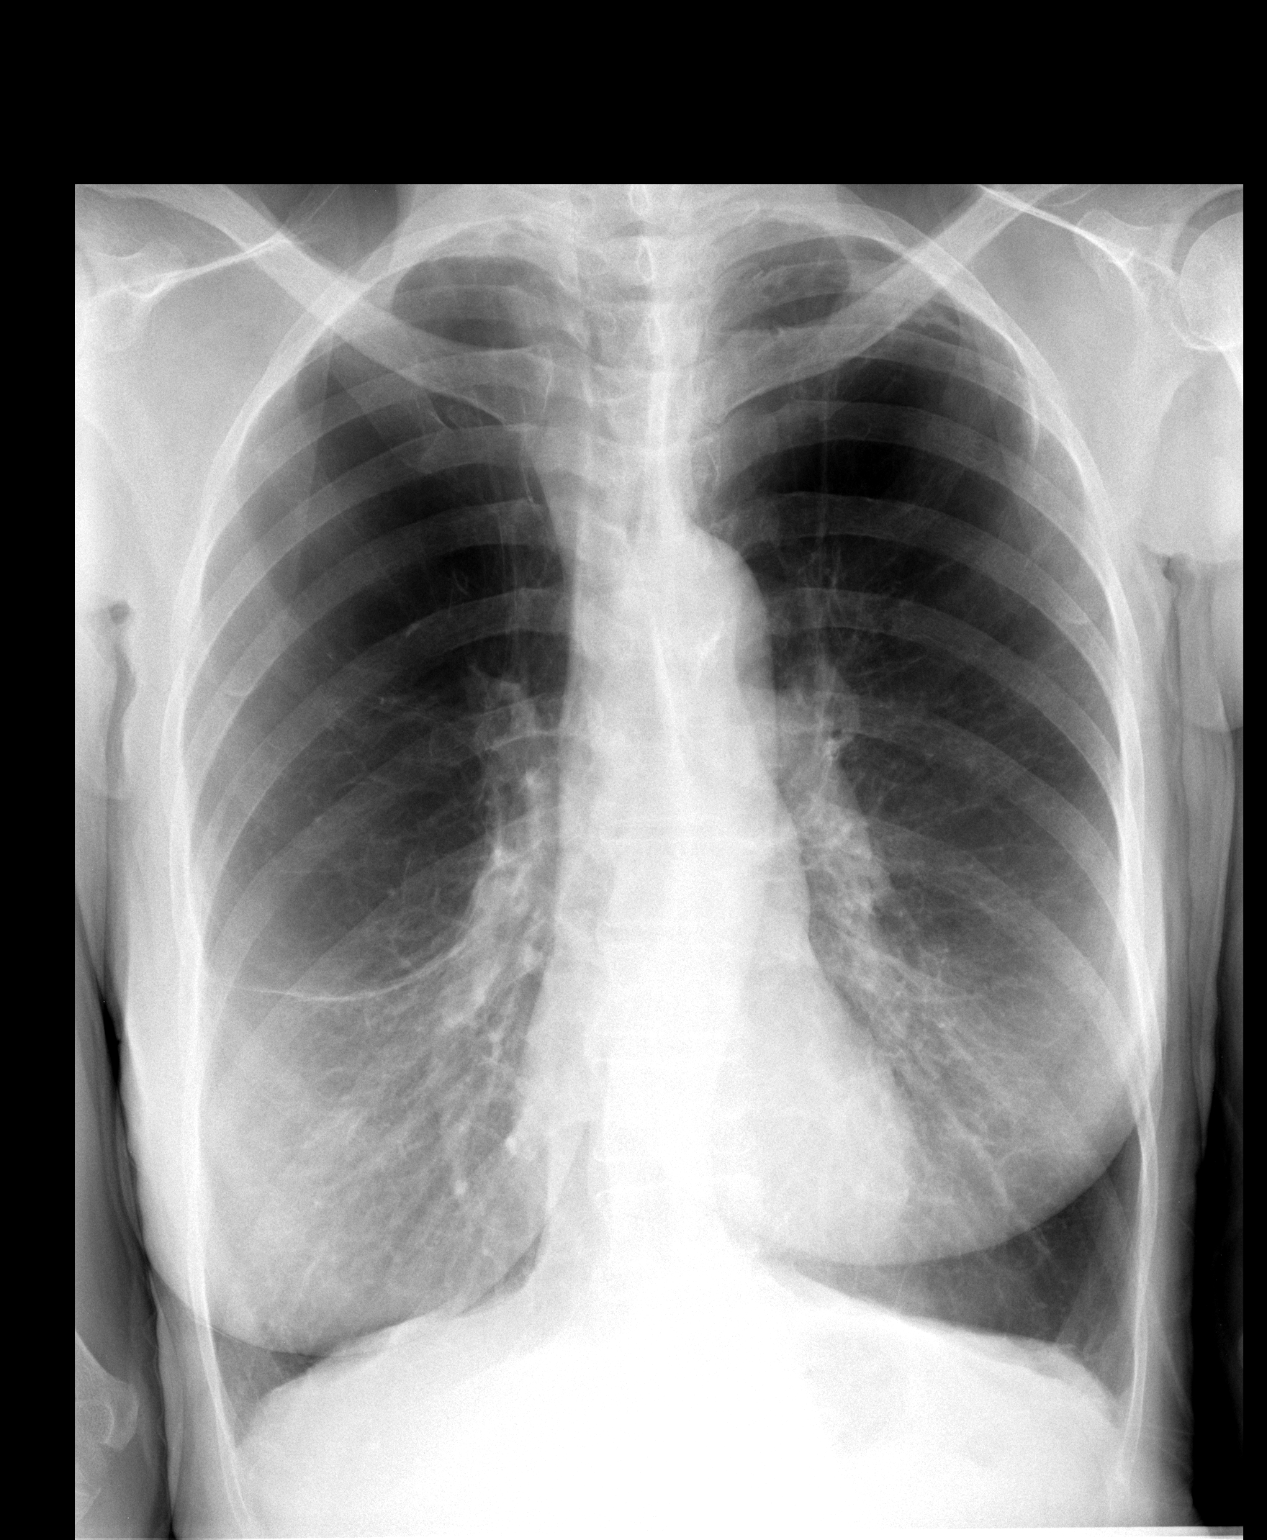

[view not recorded (2 of 2)]
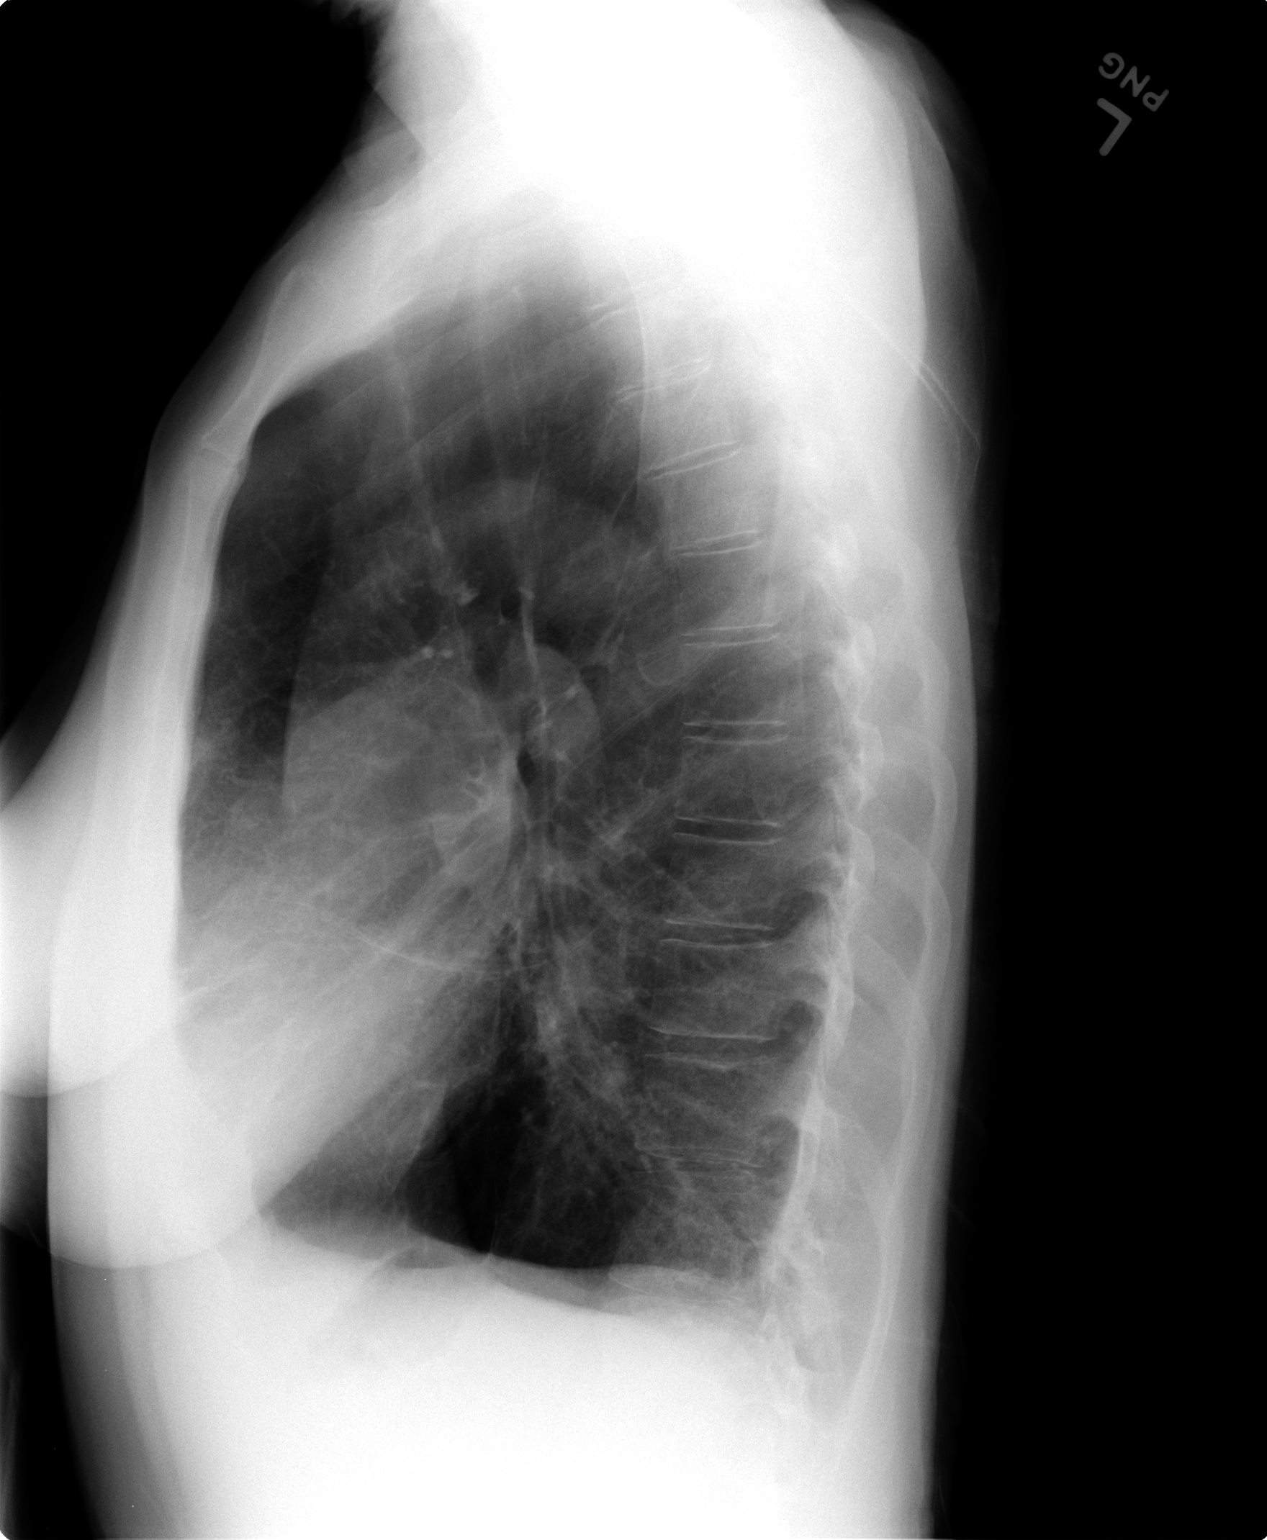

[2 of 2 positions shown; findings below may reference images not displayed]

FINDINGS: The previously noted lingular infiltrate has cleared. There is
underlying emphysematous change with bullous disease in the upper
lobes. There is currently no edema or consolidation. The heart size
is normal. The pulmonary vascularity is stable and reflects the
underlying emphysematous change. No adenopathy. There is upper
lumbar levoscoliosis.
IMPRESSION: Currently no edema or consolidation. Underlying emphysematous
change.

## 2016-06-04 DIAGNOSIS — J449 Chronic obstructive pulmonary disease, unspecified: Secondary | ICD-10-CM | POA: Diagnosis not present

## 2016-06-06 ENCOUNTER — Other Ambulatory Visit: Payer: Self-pay | Admitting: *Deleted

## 2016-06-06 NOTE — Patient Outreach (Signed)
[facility]) Care Management  06/06/2016  AMBERLYNN STRADLING 1936-06-20 [id number]   RN attempted to reach pt today with a scheduled telephone call however pt not available. RN able to leave a HIPAA approved voice message requesting a call back. Will inquire further at that time as pt has mentioned that her son moved in to assist her with her needs. Currently awaiting a call back. Will reschedule another follow up call for next week.  [name], RN Care Management Coordinator [place] Office [phone]

## 2016-06-09 ENCOUNTER — Other Ambulatory Visit: Payer: Self-pay | Admitting: *Deleted

## 2016-06-09 ENCOUNTER — Encounter: Payer: Self-pay | Admitting: *Deleted

## 2016-06-09 DIAGNOSIS — R933 Abnormal findings on diagnostic imaging of other parts of digestive tract: Secondary | ICD-10-CM | POA: Diagnosis not present

## 2016-06-09 NOTE — Patient Outreach (Signed)
[facility]) Care Management  06/09/2016  RIDHIMA DEMERATH 1936-05-24 [id number]   RN attempted outreach call to pt once again today however only able to leave a HIPAA approved voice message to both the pt's cell and home contact numbers provided via EPIC. Will continue outreach attempts accordingly for update and possible case closure based upon the last discussion and home visit with this pt. Pt indicated she son was going to move in at that time for an added support measure to assist pt with her ADLs.   [name], RN Care Management Coordinator [place] Office [phone]

## 2016-06-09 NOTE — Patient Outreach (Signed)
[facility]) Care Management  06/09/2016  Holly Page Jul 24, 1936 [id number]   RN spoke with pt and verified she continues to do well with no reported issues or acute events. Pt verified she remains in the GREEN zone and now has assistance from her son. Based upon the last discussion will discharge pt as she continues to manage her care with no problems. Pt aware if she has another hospitalization [facility] may outreach to her and inquired on any possible needs at that time.  Pt is also aware she can contact the [facility] office if she needs additional community resources. No other request or inquires at this time as RN will discharge pt via [place] and has permission to notify the provider involved at this time. Case will be closed.  [name], RN Care Management Coordinator [place] Office [phone]

## 2016-06-12 ENCOUNTER — Ambulatory Visit (INDEPENDENT_AMBULATORY_CARE_PROVIDER_SITE_OTHER): Payer: Medicare Other | Admitting: Adult Health

## 2016-06-12 ENCOUNTER — Ambulatory Visit: Payer: Self-pay | Admitting: *Deleted

## 2016-06-12 ENCOUNTER — Encounter: Payer: Self-pay | Admitting: Adult Health

## 2016-06-12 ENCOUNTER — Other Ambulatory Visit: Payer: Self-pay | Admitting: *Deleted

## 2016-06-12 DIAGNOSIS — J9611 Chronic respiratory failure with hypoxia: Secondary | ICD-10-CM

## 2016-06-12 DIAGNOSIS — J439 Emphysema, unspecified: Secondary | ICD-10-CM | POA: Diagnosis not present

## 2016-06-12 MED ORDER — FLUTICASONE FUROATE-VILANTEROL 100-25 MCG/INH IN AEPB: 1.0000 | INHALATION_SPRAY | Freq: Every day | RESPIRATORY_TRACT | 0 refills | Status: AC

## 2016-06-12 NOTE — Patient Instructions (Addendum)
Continue on BREO 1 puff daily , rinse after use.   Continue on Albuterol Neb Four times a day  .  Continue on Pulmicort Neb Twice daily  .  Continue on Oxygen 4l/m with activity and 3l/m at rest .  Stress reducers with breathing exercises as we discussed.  Activity as tolerated.  Follow up in 4 weeks as planned and As needed   Please contact office for sooner follow up if symptoms do not improve or worsen or seek emergency care

## 2016-06-12 NOTE — Progress Notes (Signed)
Subjective:    Patient ID: BABYGIRL RAUTH, female    DOB: [birth year], 80 y.o.   MRN: [id number]  HPI 80 y.o. F with Gold D Copd primary emphysema on O2 >GOLD card patient  Hx of DVT -05/2014 (tx w/ xarelto until 12/2014 ) -followed by hematology  Left arm superficial venous thrombus , Xarelto 01/25/15 >followed by Hematology >stopped 07/2015  CT chest, on March 19 2015 >that showed no evidence of pulmonary embolism  and stable. Severe COPD changes. Last spirometry showed FEV1 at 30% in 2015.  She has been tried on [place] but was unable to tolerate. Oxygen does drop with pulsing O2 on 2 L was able to keep above 90% on pulsing 3 L. Patient says she has been evaluated by cardiology with a negative stress test recently.    06/12/2016   Follow up :   : COPD /O2 dependent  Pt presents for a 2 week follow up for COPD  Currently on BREO daily and Pulmicort Neb Twice daily  along with albuterol nebs Four times a day   She has tried several inhalers and nebs in past., has several intolerances.  She is feeling better, walked in office today. She is trying to move around more.    Patient denies any hemoptysis, orthopnea, PND or chest pain. Prevnar/PVX are utd.  Declines flu shot today.   Gets meals on wheels , 1 meal daily for 5 days.     Past Medical History:  Diagnosis Date  . COPD (chronic obstructive pulmonary disease) ([place])   . DVT (deep venous thrombosis) ([place]) 1980s, recurrent 2015   Right DVT 2015  on xarelto  . GERD (gastroesophageal reflux disease)   . Hypertension   . Lactose intolerance   . Meningioma ([place])    followed by Dr Christella Noa  . Pneumonia   . Spinal stenosis    Current Outpatient Prescriptions on File Prior to Visit  Medication Sig Dispense Refill  . acetaminophen-codeine (TYLENOL #3) 300-30 MG tablet Take 1-2 tablets by mouth every 8 (eight) hours as needed for moderate pain or severe pain. 60 tablet 0  . albuterol (PROVENTIL  HFA;VENTOLIN HFA) 108 (90 Base) MCG/ACT inhaler Inhale 2 puffs into the lungs every 6 (six) hours as needed for wheezing or shortness of breath. 1 Inhaler 5  . albuterol (PROVENTIL) (2.5 MG/3ML) 0.083% nebulizer solution TAKE 3 ML'S BY NEBULIZATION EVERY 6 HOURS AS NEEDED FOR SHORTNESS OF BREATH AND AS NEEDED 375 mL 11  . budesonide (PULMICORT) 0.25 MG/2ML nebulizer solution Take 2 mLs (0.25 mg total) by nebulization 2 (two) times daily. DX: J43.9 120 mL 12  . cholecalciferol (VITAMIN D) 1000 UNITS tablet Take 1,000 Units by mouth daily.    . feeding supplement (BOOST / RESOURCE BREEZE) LIQD Take 1 Container by mouth 3 (three) times daily between meals. 21 Container 0  . Ferrous Sulfate (IRON SLOW RELEASE) 143 (45 Fe) MG TBCR Take by mouth daily.    . fluticasone furoate-vilanterol (BREO ELLIPTA) 100-25 MCG/INH AEPB Inhale 1 puff into the lungs daily. 1 each 0  . Multiple Vitamins-Minerals (MULTIVITAMIN & MINERAL PO) Take 1 tablet by mouth daily.    . OXYGEN Inhale 3 L/hr into the lungs.    . potassium chloride SA (K-DUR,KLOR-CON) 20 MEQ tablet Take 1 tablet (20 mEq total) by mouth daily. 90 tablet 2  . ranitidine (ZANTAC) 300 MG tablet Take 1 tablet (300 mg total) by mouth at bedtime. 1  tablet at bedtime 90 tablet 2  . sodium chloride (OCEAN) 0.65 % SOLN nasal spray Place 2 sprays into both nostrils as needed for congestion. Reported on 12/31/2015    . solifenacin (VESICARE) 5 MG tablet Take 5 mg by mouth daily. Pt does not take daily (takes PRN)    . verapamil (CALAN-SR) 240 MG CR tablet Take 2 tablets (480 mg total) by mouth daily. (Patient taking differently: Take 480 mg by mouth daily. Alternate between [1] daily and [2] daily) 60 tablet 5  . ALPRAZolam (XANAX) 0.25 MG tablet Take 1/2 - 1 tablet by mouth daily as needed (Patient not taking: Reported on 06/12/2016) 30 tablet 0   No current facility-administered medications on file prior to visit.      Review of Systems  Constitutional:   No   weight loss, night sweats,  Fevers, chills, + fatigue, or  lassitude.  HEENT:   No headaches,  Difficulty swallowing,  Tooth/dental problems, or  Sore throat,                No sneezing, itching, ear ache,  CV:  No chest pain,  Orthopnea, PND, swelling in lower extremities, anasarca, dizziness, palpitations, syncope.   GI  Notes heartburn, no indigestion, abdominal pain, nausea, vomiting, diarrhea, change in bowel habits, loss of appetite, bloody stools.   Resp:  .    No chest wall deformity  Skin: no rash or lesions.  GU: no dysuria, change in color of urine, no urgency or frequency.  No flank pain, no hematuria   MS:  No joint pain or swelling.  No decreased range of motion.  No back pain.  Psych:  No change in mood or affect.+anxiety.  No memory loss.      Objective:   Physical Exam Vitals:   06/12/16 0956  Pulse: 73  Temp: 97.9 F (36.6 C)  TempSrc: Oral  SpO2: 95%  Weight: 107 lb (48.5 kg)  Height: 5\' 5"  (1.651 m)     GEN: A/Ox3; pleasant , NAD, elderly and frail , on O2 , calmer today ,walked in today, smiling more    HEENT:  [place]/AT,  EACs-clear, TMs-wnl, NOSE-clear, THROAT-clear, no lesions  NECK:  Supple w/ fair ROM; no JVD; normal carotid impulses w/o bruits; no thyromegaly or nodules palpated; no lymphadenopathy.    RESP  Diminished in bases w/ no wheezing or rhonchi. no accessory muscle use, no dullness to percussion  CARD:  RRR, no m/r/g  , no peripheral edema, pulses intact, no cyanosis or clubbing.  GI:   Soft & nt; nml bowel sounds; no organomegaly or masses detected.   Musco: Warm bil, no deformities or joint swelling noted.    Neuro: alert, no focal deficits noted.  Anxious   Skin: Warm, no lesions or rashes    [name] NP-C  [facility]  06/12/2016

## 2016-06-12 NOTE — Assessment & Plan Note (Signed)
POC order to DME   Patient Instructions  Continue on BREO 1 puff daily , rinse after use.   Continue on Albuterol Neb Four times a day  .  Continue on Pulmicort Neb Twice daily  .  Continue on Oxygen 4l/m with activity and 3l/m at rest .  Stress reducers with breathing exercises as we discussed.  Activity as tolerated.  Follow up in 4 weeks as planned and As needed   Please contact office for sooner follow up if symptoms do not improve or worsen or seek emergency care

## 2016-06-12 NOTE — Assessment & Plan Note (Signed)
Currently doing well without flare  Declines flu shot   Plan  Patient Instructions  Continue on BREO 1 puff daily , rinse after use.   Continue on Albuterol Neb Four times a day  .  Continue on Pulmicort Neb Twice daily  .  Continue on Oxygen 4l/m with activity and 3l/m at rest .  Stress reducers with breathing exercises as we discussed.  Activity as tolerated.  Follow up in 4 weeks as planned and As needed   Please contact office for sooner follow up if symptoms do not improve or worsen or seek emergency care

## 2016-06-14 NOTE — Progress Notes (Signed)
Reviewed & agree with plan  

## 2016-06-16 IMAGING — US US EXTREM LOW VENOUS*R*
1 series · 13 of 24 positions shown · non-contrast
Comparison: 09/19/2014

CLINICAL DATA: 78-year-old female with a history of prior DVT.

Prior study 09/19/2014 demonstrates no acute DVT with questionable
chronic right femoral vein DVT.
EXAM:
RIGHT LOWER EXTREMITY VENOUS DOPPLER ULTRASOUND
TECHNIQUE: Gray-scale sonography with graded compression, as well as color
Doppler and duplex ultrasound were performed to evaluate the lower
extremity deep venous systems from the level of the common femoral
vein and including the common femoral, femoral, profunda femoral,
popliteal and calf veins including the posterior tibial, peroneal
and gastrocnemius veins when visible. The superficial great
saphenous vein was also interrogated. Spectral Doppler was utilized
to evaluate flow at rest and with distal augmentation maneuvers in
the common femoral, femoral and popliteal veins.

[Series 1: us extrem low venous*right* · 0.05mm/px · 13 of 32 slices shown]
[im 1/32]
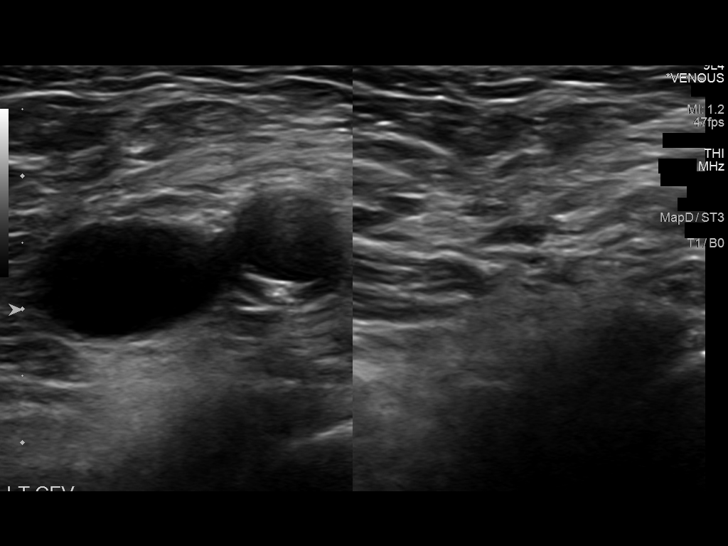
[im 3/32]
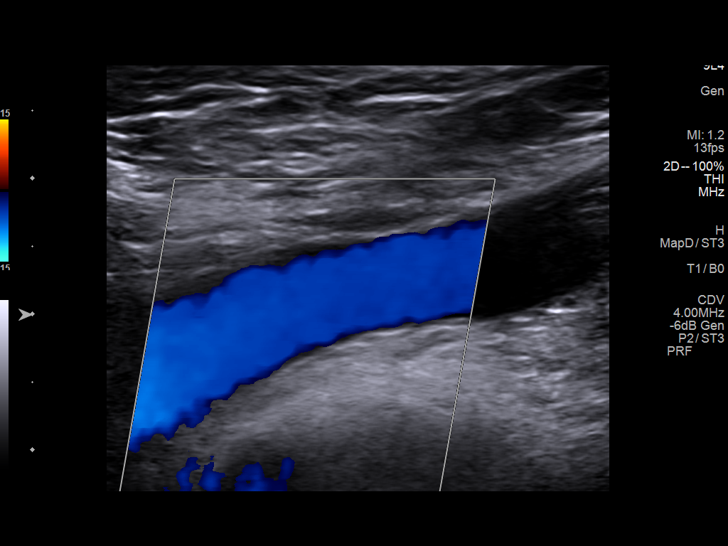
[im 6/32]
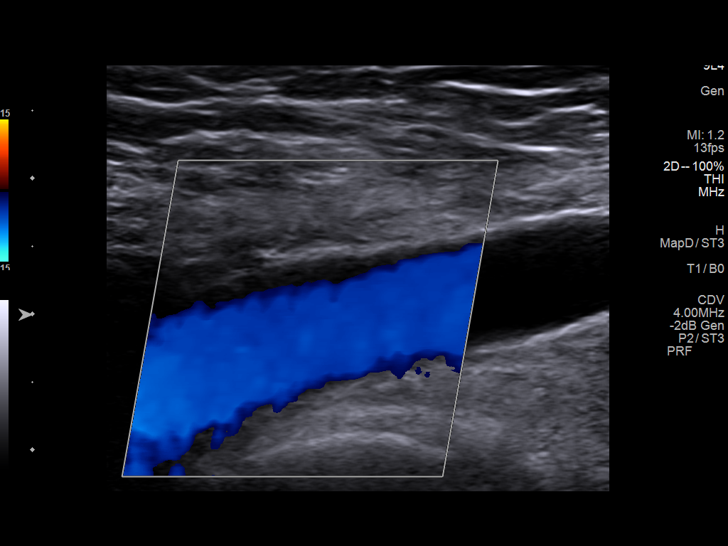
[im 9/32]
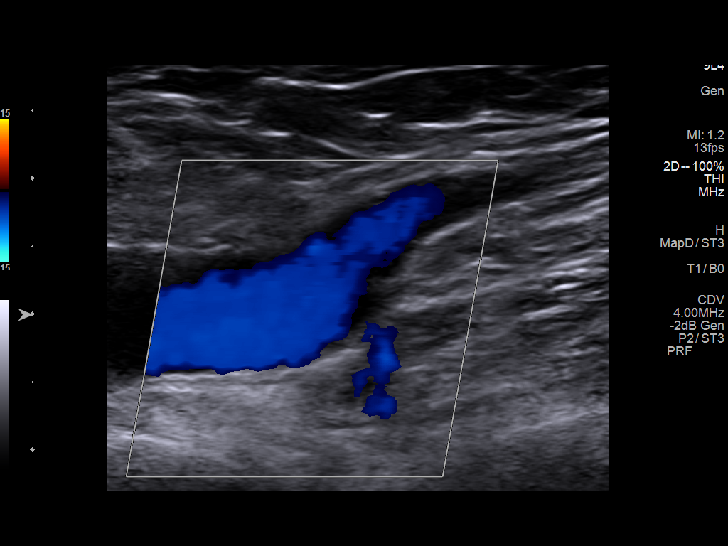
[im 11/32]
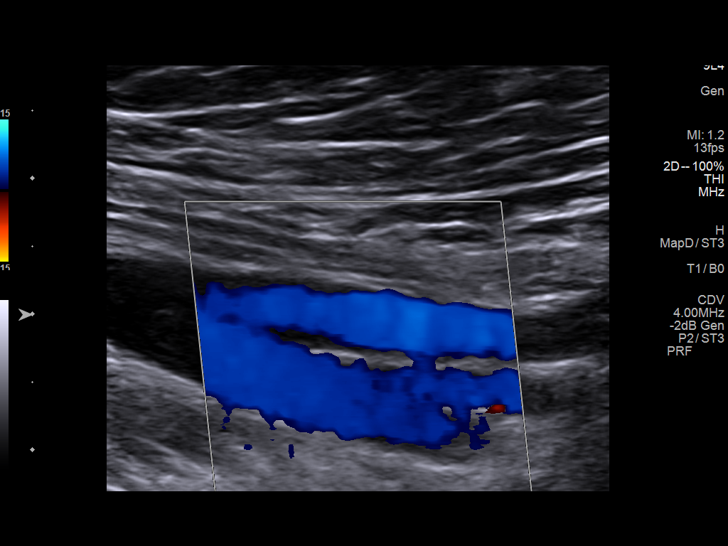
[im 14/32]
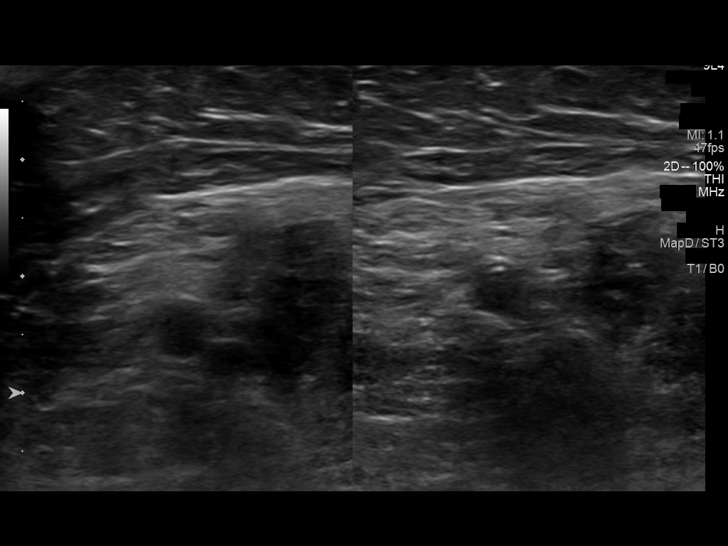
[im 17/32]
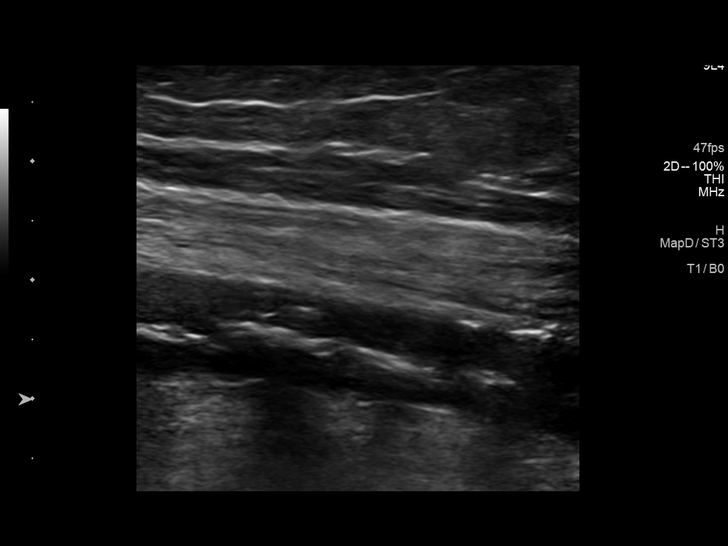
[im 18/32]
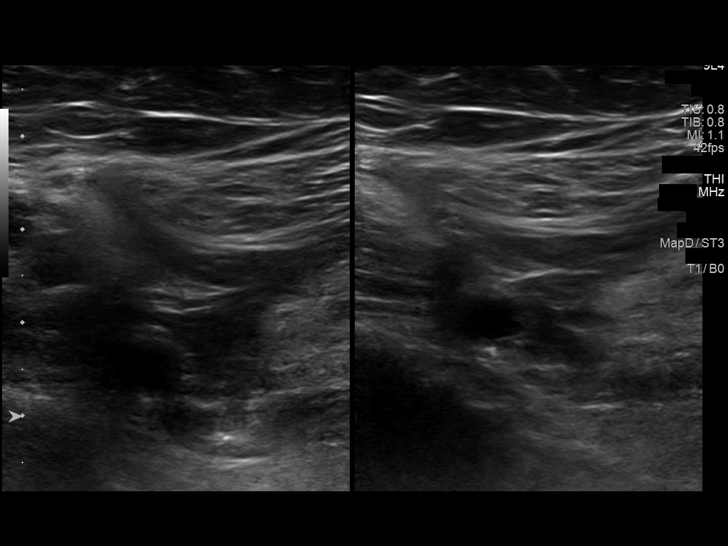
[im 21/32]
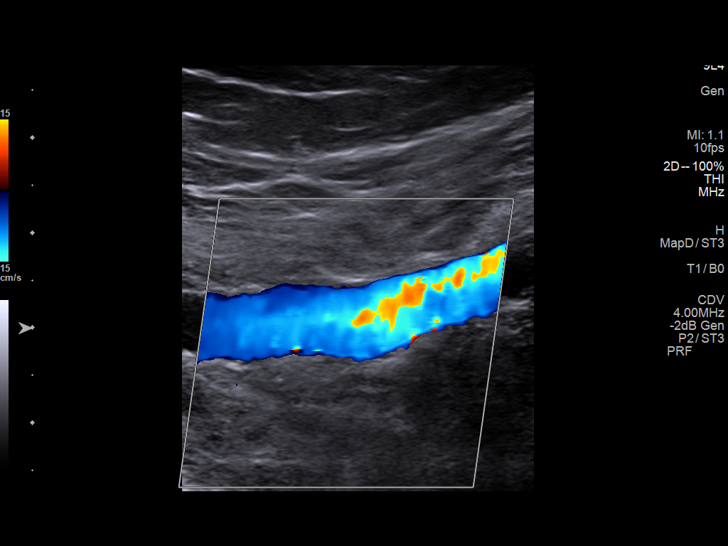
[im 23/32]
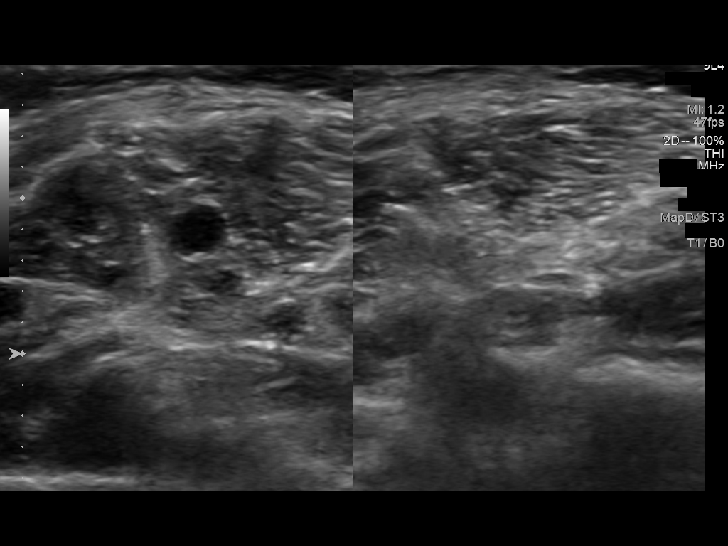
[im 26/32]
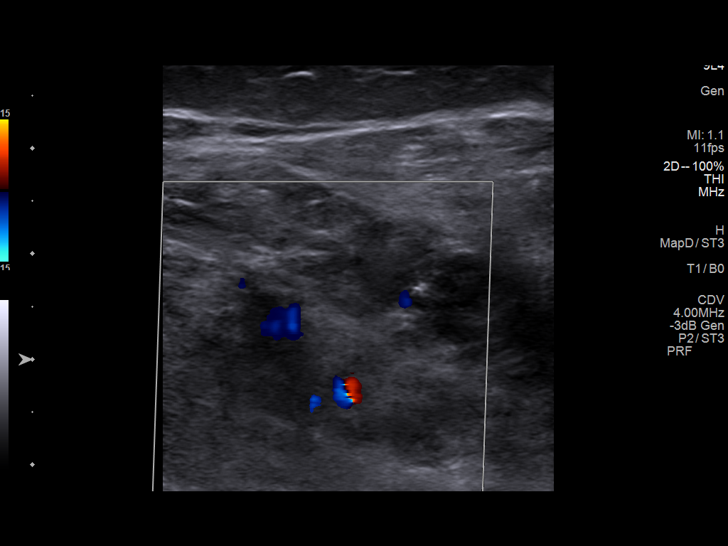
[im 29/32]
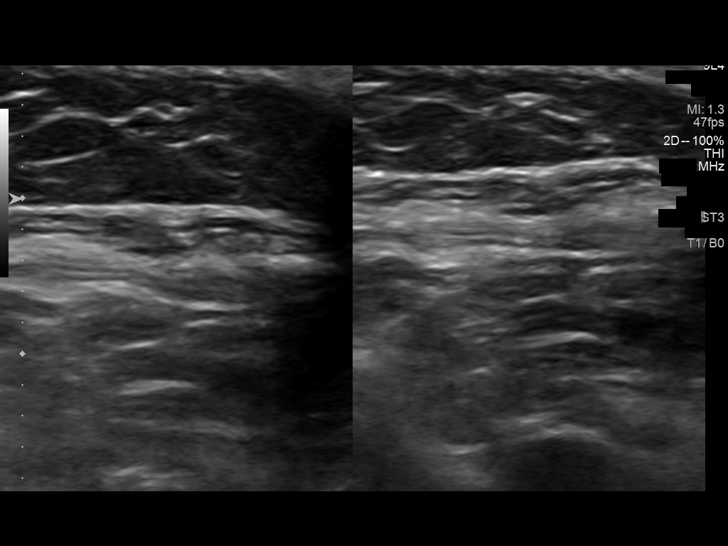
[im 32/32]
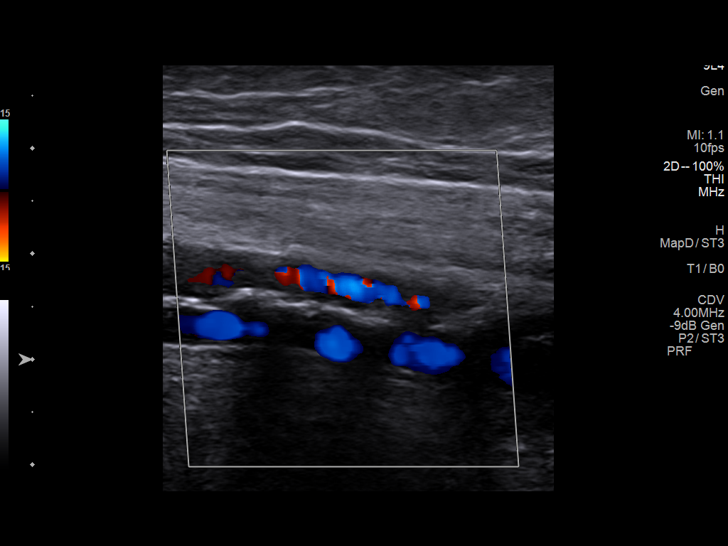

[13 of 24 positions shown; findings below may reference images not displayed]

FINDINGS: Contralateral Common Femoral Vein: Respiratory phasicity is normal
and symmetric with the symptomatic side. No evidence of thrombus.
Normal compressibility.

Common Femoral Vein: No evidence of thrombus. Normal
compressibility, respiratory phasicity and response to augmentation.

Saphenofemoral Junction: No evidence of thrombus. Normal
compressibility and flow on color Doppler imaging.

Profunda Femoral Vein: No evidence of thrombus. Normal
compressibility and flow on color Doppler imaging.

Femoral Vein: No evidence of acute occlusive thrombus. Chronic wall
thickening. Flow maintained.

Popliteal Vein: No evidence of thrombus. Normal compressibility,
respiratory phasicity and response to augmentation.

Calf Veins: No evidence of thrombus. Normal compressibility and flow
on color Doppler imaging.

Superficial Great Saphenous Vein: No evidence of thrombus. Normal
compressibility and flow on color Doppler imaging.

Other Findings:  None.
IMPRESSION: Sonographic survey of the right lower extremity demonstrates no
evidence of acute DVT.

Changes of chronic / recanalized DVT of the right femoral vein.

## 2016-06-17 ENCOUNTER — Telehealth: Payer: Self-pay | Admitting: Adult Health

## 2016-06-17 NOTE — Telephone Encounter (Signed)
Spoke with Jasmine at [employer]  She states that they received a call from [facility] stating that pt did not qualify for simply mini go  Will close encounter and route to TP as [employer]

## 2016-06-20 ENCOUNTER — Telehealth: Payer: Self-pay | Admitting: Adult Health

## 2016-06-20 DIAGNOSIS — J439 Emphysema, unspecified: Secondary | ICD-10-CM

## 2016-06-20 MED ORDER — BUDESONIDE 0.25 MG/2ML IN SUSP
0.2500 mg | Freq: Two times a day (BID) | RESPIRATORY_TRACT | 12 refills | Status: DC
Start: 2016-06-20 — End: 2016-07-21

## 2016-06-20 NOTE — Telephone Encounter (Signed)
Pt requesting refill of Budesonide, this was supposed to be sent this morning.  Per EPIC the Rx was printed. Pt [facility].  [facility] per [facility] request.  Per [facility], this was received and is being processed.  Nothing further needed.

## 2016-06-25 ENCOUNTER — Telehealth: Payer: Self-pay | Admitting: Adult Health

## 2016-06-25 MED ORDER — PREDNISONE 10 MG PO TABS: ORAL_TABLET | ORAL | 0 refills | Status: DC

## 2016-06-25 NOTE — Telephone Encounter (Signed)
Per verbal order from TP Okay to send pred taper We can work her into [employer] office if needed tomorrow  Called spoke with pt. Reviewed TP's recs and [facility]. I offered ov in [facility] with TP but she refused at this time and stated that she would wait and see how she felt after starting pred taper. She voiced understanding and had no further questions.   RX sent. Nothing further needed.

## 2016-06-25 NOTE — Telephone Encounter (Signed)
Patient is having a lot of SOB, chest tightness, wants to know if she can get a prescription for Prednisone to help with the chest tightness.  She said the last time she got the Prednisone taper it helped a lot.  TP - please advise.

## 2016-06-26 DIAGNOSIS — J449 Chronic obstructive pulmonary disease, unspecified: Secondary | ICD-10-CM | POA: Diagnosis not present

## 2016-06-30 ENCOUNTER — Ambulatory Visit: Payer: Medicare Other | Admitting: Family Medicine

## 2016-07-03 ENCOUNTER — Encounter: Payer: Self-pay | Admitting: Pulmonary Disease

## 2016-07-03 ENCOUNTER — Ambulatory Visit (INDEPENDENT_AMBULATORY_CARE_PROVIDER_SITE_OTHER): Payer: Medicare Other | Admitting: Pulmonary Disease

## 2016-07-03 VITALS — BP 133/76 | HR 74 | Ht 65.0 in | Wt 101.0 lb

## 2016-07-03 DIAGNOSIS — J9611 Chronic respiratory failure with hypoxia: Secondary | ICD-10-CM

## 2016-07-03 DIAGNOSIS — J441 Chronic obstructive pulmonary disease with (acute) exacerbation: Secondary | ICD-10-CM

## 2016-07-03 MED ORDER — FLUTICASONE FUROATE-VILANTEROL 100-25 MCG/INH IN AEPB
1.0000 | INHALATION_SPRAY | Freq: Every day | RESPIRATORY_TRACT | 0 refills | Status: AC
Start: 2016-07-03 — End: 2016-08-02

## 2016-07-03 NOTE — Assessment & Plan Note (Signed)
Find the price of BREO and call us back for prescription-if too expensive, find out alternatives Continue albuterol nebs 3-4 times daily I doubt that Atrovent is a true allergy but she isn't willing to try

## 2016-07-03 NOTE — Assessment & Plan Note (Signed)
Continue pulse O2 She does not want flu shot today

## 2016-07-03 NOTE — Addendum Note (Signed)
Addended by: Mathis Dad on: 07/03/2016 02:25 PM   Modules accepted: Orders

## 2016-07-03 NOTE — Patient Instructions (Signed)
Find the price of BREO and call us back for prescription-if too expensive, find out alternatives Continue albuterol nebs 3-4 times daily

## 2016-07-03 NOTE — Progress Notes (Signed)
   Subjective:    Patient ID: Holly Page, female    DOB: 1935-09-30, 80 y.o.   MRN: [id number]  HPI  80 y.o. F with Gold D Copd primary emphysema on O2 >GOLD card patient  Hx of DVT -05/2014 (tx w/ xarelto until 12/2014 ) -followed by hematology  Left arm superficial venous thrombus , Xarelto 01/25/15 >followed by Hematology >stopped 07/2015  She has chronic diarrhea and protein calorie malnutrition  Atrovent and long acting analogs are listed as allergies  07/03/2016  Chief Complaint  Patient presents with  . Acute Visit    fatigue, weakness, out of breath.  chest tightness    Has been off daily prednisone since end of June 2017.  She is now on Breo and albuterol nebs which she takes 3-4 times daily. She had a flareup 04/2016 and was seen every 2 weeks-she is now back to her baseline  She does not want a flu shot today She is compliant with oxygen and complaints of watery nasal discharge   Significant tests/ events  CT chest, on March 19 2015 >that showed no evidence of pulmonary embolism  and stable. Severe COPD changes. 2015 spirometry showed FEV1 at 30%  She has been tried on Spiriva , Incruse and [place] but was unable to tolerate.     Review of Systems neg for any significant sore throat, dysphagia, itching, sneezing, nasal congestion or excess/ purulent secretions, fever, chills, sweats, unintended wt loss, pleuritic or exertional cp, hempoptysis, orthopnea pnd or change in chronic leg swelling.   Also denies presyncope, palpitations, heartburn, abdominal pain, nausea, vomiting, diarrhea or change in bowel or urinary habits, dysuria,hematuria, rash, arthralgias, visual complaints, headache, numbness weakness or ataxia.     Objective:   Physical Exam  Gen. Pleasant, thin woman, in no distress ENT - no lesions, no post nasal drip Neck: No JVD, no thyromegaly, no carotid bruits Lungs: no use of accessory muscles, no dullness to percussion,Decreased breath sounds  bilateral without rales or rhonchi  Cardiovascular: Rhythm regular, heart sounds  normal, no murmurs or gallops, no peripheral edema Musculoskeletal: No deformities, no cyanosis or clubbing        Assessment & Plan:

## 2016-07-04 ENCOUNTER — Ambulatory Visit (HOSPITAL_BASED_OUTPATIENT_CLINIC_OR_DEPARTMENT_OTHER): Payer: Medicare Other | Admitting: Hematology & Oncology

## 2016-07-04 ENCOUNTER — Other Ambulatory Visit (HOSPITAL_BASED_OUTPATIENT_CLINIC_OR_DEPARTMENT_OTHER): Payer: Medicare Other

## 2016-07-04 DIAGNOSIS — Z86718 Personal history of other venous thrombosis and embolism: Secondary | ICD-10-CM

## 2016-07-04 DIAGNOSIS — J449 Chronic obstructive pulmonary disease, unspecified: Secondary | ICD-10-CM

## 2016-07-04 DIAGNOSIS — D508 Other iron deficiency anemias: Secondary | ICD-10-CM

## 2016-07-04 DIAGNOSIS — J430 Unilateral pulmonary emphysema [MacLeod's syndrome]: Secondary | ICD-10-CM

## 2016-07-04 LAB — CMP (CANCER CENTER ONLY)
ALT(SGPT): 28 U/L (ref 10–47)
AST: 51 U/L — ABNORMAL HIGH (ref 11–38)
Albumin: 4 g/dL (ref 3.3–5.5)
Alkaline Phosphatase: 71 U/L (ref 26–84)
BUN: 11 mg/dL (ref 7–22)
CHLORIDE: 93 meq/L — AB (ref 98–108)
CO2: 35 mEq/L — ABNORMAL HIGH (ref 18–33)
Calcium: 10.1 mg/dL (ref 8.0–10.3)
Creat: 0.6 mg/dl (ref 0.6–1.2)
Glucose, Bld: 94 mg/dL (ref 73–118)
POTASSIUM: 3.9 meq/L (ref 3.3–4.7)
Sodium: 139 mEq/L (ref 128–145)
Total Bilirubin: 0.8 mg/dl (ref 0.20–1.60)
Total Protein: 7.1 g/dL (ref 6.4–8.1)

## 2016-07-04 LAB — CBC WITH DIFFERENTIAL (CANCER CENTER ONLY)
BASO#: 0 10*3/uL (ref 0.0–0.2)
BASO%: 0.1 % (ref 0.0–2.0)
EOS%: 1.7 % (ref 0.0–7.0)
Eosinophils Absolute: 0.1 10*3/uL (ref 0.0–0.5)
HCT: 37.7 % (ref 34.8–46.6)
HEMOGLOBIN: 11.3 g/dL — AB (ref 11.6–15.9)
LYMPH#: 3.1 10*3/uL (ref 0.9–3.3)
LYMPH%: 43 % (ref 14.0–48.0)
MCH: 23 pg — AB (ref 26.0–34.0)
MCHC: 30 g/dL — ABNORMAL LOW (ref 32.0–36.0)
MCV: 77 fL — AB (ref 81–101)
MONO#: 0.7 10*3/uL (ref 0.1–0.9)
MONO%: 9.4 % (ref 0.0–13.0)
NEUT%: 45.8 % (ref 39.6–80.0)
NEUTROS ABS: 3.3 10*3/uL (ref 1.5–6.5)
PLATELETS: 276 10*3/uL (ref 145–400)
RBC: 4.92 10*6/uL (ref 3.70–5.32)
RDW: 16 % — AB (ref 11.1–15.7)
WBC: 7.1 10*3/uL (ref 3.9–10.0)

## 2016-07-04 NOTE — Progress Notes (Signed)
Hematology and Oncology Follow Up Visit  Holly [id number] Nov 22, 1935 80 y.o. 07/04/2016   Principle Diagnosis:  Thromboembolic disease of the right thigh Superficial thrombophlebitis of the left arm  Current Therapy:   Observation  Interim History:  Holly Page is here today for follow-up. . We last saw her about 4 months ago. She has been doing okay although she continues to lose some weight. I really think that the weight loss is from her underlying COPD.  She has had some urinary issues. I think she has seen a urologist. I'm not sure what really can be done about this. She is not a candidate for any surgery because of her poor lung function. Overall, her performance status is ECOG 2. .  Her appetite is doing okay. She has had no problems with nausea or vomiting. She has had no obvious diarrhea.  There is no bleeding.  She has had no fever. She has had no infections. She has not been hospitalized.  Overall, I would say that  her performance status is ECOG 3.  Medications:    Medication List       Accurate as of 07/04/16 12:01 PM. Always use your most recent med list.          acetaminophen-codeine 300-30 MG tablet Commonly known as:  TYLENOL #3 Take 1-2 tablets by mouth every 8 (eight) hours as needed for moderate pain or severe pain.   albuterol (2.5 MG/3ML) 0.083% nebulizer solution Commonly known as:  PROVENTIL TAKE 3 ML'S BY NEBULIZATION EVERY 6 HOURS AS NEEDED FOR SHORTNESS OF BREATH AND AS NEEDED   albuterol 108 (90 Base) MCG/ACT inhaler Commonly known as:  PROVENTIL HFA;VENTOLIN HFA Inhale 2 puffs into the lungs every 6 (six) hours as needed for wheezing or shortness of breath.   ALPRAZolam 0.25 MG tablet Commonly known as:  XANAX Take 1/2 - 1 tablet by mouth daily as needed   budesonide 0.25 MG/2ML nebulizer solution Commonly known as:  PULMICORT Take 2 mLs (0.25 mg total) by nebulization 2 (two) times daily. DX: J43.9   cholecalciferol  1000 units tablet Commonly known as:  VITAMIN D Take 1,000 Units by mouth daily.   feeding supplement Liqd Take 1 Container by mouth 3 (three) times daily between meals.   fluticasone furoate-vilanterol 100-25 MCG/INH Aepb Commonly known as:  BREO ELLIPTA Inhale 1 puff into the lungs daily.   fluticasone furoate-vilanterol 100-25 MCG/INH Aepb Commonly known as:  BREO ELLIPTA Inhale 1 puff into the lungs daily.   IRON SLOW RELEASE 143 (45 Fe) MG Tbcr Generic drug:  Ferrous Sulfate Take by mouth daily.   MULTIVITAMIN & MINERAL PO Take 1 tablet by mouth daily.   OXYGEN Inhale 3 L/hr into the lungs.   potassium chloride SA 20 MEQ tablet Commonly known as:  K-DUR,KLOR-CON Take 1 tablet (20 mEq total) by mouth daily.   predniSONE 10 MG tablet Commonly known as:  DELTASONE Take 4 tabs in am x 2 days, 3 tabs in am x 2 days, 2 tabs in am x 2 days, 1 tabs in am x 2 days then stop   ranitidine 300 MG tablet Commonly known as:  ZANTAC Take 1 tablet (300 mg total) by mouth at bedtime. 1 tablet at bedtime   sodium chloride 0.65 % Soln nasal spray Commonly known as:  OCEAN Place 2 sprays into both nostrils as needed for congestion. Reported on 12/31/2015   solifenacin 5 MG tablet Commonly known as:  VESICARE Take 5 mg  by mouth daily. Pt does not take daily (takes PRN)   verapamil 240 MG CR tablet Commonly known as:  CALAN-SR Take 2 tablets (480 mg total) by mouth daily.       Allergies:  Allergies  Allergen Reactions  . Incruse Ellipta [Umeclidinium Bromide] Shortness Of Breath  . Tramadol Shortness Of Breath  . Amlodipine     Other reaction(s): SWELLING  . Aspirin     Other reaction(s): PALPITATIONS Other reaction(s): DIFFICULTY BREATHING Heart flutter  . Codeine Other (See Comments)    Hallucinations, can take Tylenol #3 now  . Doxycycline     Other reaction(s): NAUSEA,VOMITING  . Hydrocodone     hallucinations  . Losartan     unknown  . Propoxyphene Nausea  Only    Other reaction(s): NAUSEA  . Gabapentin     Other reaction(s): Other (See Comments) She could not swallow the large pill  . Tiotropium Itching and Rash    Past Medical History, Surgical history, Social history, and Family History were reviewed and updated.  Review of Systems: All other 10 point review of systems is negative.   Physical Exam:  vitals were not taken for this visit.  Wt Readings from Last 3 Encounters:  07/03/16 101 lb (45.8 kg)  06/12/16 107 lb (48.5 kg)  05/29/16 103 lb (46.7 kg)    Well-developed and well-nourished Holly Page. Her head and neck exam shows no ocular or oral lesions. She has no palpable cervical or supraclavicular lymph nodes. Lungs are With decreased breath sounds bilaterally. I hear no wheezes.  Cardiac exam regular rate and rhythm with no murmurs, rubs or bruits. Abdomen is soft. She has good bowel sounds. There is no fluid wave. There is no palpable liver or spleen tip. Back exam shows no tenderness over the spine, ribs or hips. Extremities shows no swelling of the left arm. There is no venous cord noticed in the left upper arm. There is no palpable venous cords in the legs. She has good range of motion of her joints. She may have some slight nonpitting edema of the lower legs. Skin exam shows no rashes, ecchymoses or petechia. Lab Results  Component Value Date   WBC 7.1 07/04/2016   HGB 11.3 (L) 07/04/2016   HCT 37.7 07/04/2016   MCV 77 (L) 07/04/2016   PLT 276 07/04/2016   Lab Results  Component Value Date   FERRITIN 187 11/02/2015   IRON 87 11/02/2015   TIBC 329 11/02/2015   UIBC 242 11/02/2015   IRONPCTSAT 26 11/02/2015   Lab Results  Component Value Date   RBC 4.92 07/04/2016   No results found for: KPAFRELGTCHN, LAMBDASER, KAPLAMBRATIO No results found for: IGGSERUM, IGA, IGMSERUM No results found for: Odetta Pink, SPEI   Chemistry      Component Value  Date/Time   NA 139 07/04/2016 1002   NA 145 08/07/2015 0850   K 3.9 07/04/2016 1002   K 3.4 (L) 08/07/2015 0850   CL 93 (L) 07/04/2016 1002   CO2 35 (H) 07/04/2016 1002   CO2 36 (H) 08/07/2015 0850   BUN 11 07/04/2016 1002   BUN 7.6 08/07/2015 0850   CREATININE 0.6 07/04/2016 1002   CREATININE 0.7 08/07/2015 0850      Component Value Date/Time   CALCIUM 10.1 07/04/2016 1002   CALCIUM 9.8 08/07/2015 0850   ALKPHOS 71 07/04/2016 1002   ALKPHOS 74 08/07/2015 0850   AST 51 (H) 07/04/2016 1002  AST 55 (H) 08/07/2015 0850   ALT 28 07/04/2016 1002   ALT 22 08/07/2015 0850   BILITOT 0.80 07/04/2016 1002   BILITOT 0.35 08/07/2015 0850     Impression and Plan: Holly Page is 80 year old African-American Page. She has a remote history of thrombus in the right thigh. This is back in the 80s.  Then she had a superficial thrombus in the left arm back in April.  I told her that I think the problem with her weight loss is that she expends a lot of calories with her breathing. She just has poor underlying lung function. She is on chronic oxygen.  I did tell her to try some folic acid. She will do this over the counter.  We will see what her studies show we see her back. I would not think that her iron levels are low but this could be a possibility. The last time that we checked her iron was back in February.  I would like to see her back in about 2 months. We will continue to try to help maximize her blood levels so that she will feel good with the holidays, upon [place].  Volanda Napoleon, MD 10/6/201712:01 PM

## 2016-07-05 LAB — D-DIMER, QUANTITATIVE: D-DIMER: 0.48 mg/L FEU (ref 0.00–0.49)

## 2016-07-10 DIAGNOSIS — R1084 Generalized abdominal pain: Secondary | ICD-10-CM | POA: Diagnosis not present

## 2016-07-10 DIAGNOSIS — Z9981 Dependence on supplemental oxygen: Secondary | ICD-10-CM | POA: Diagnosis not present

## 2016-07-10 DIAGNOSIS — J42 Unspecified chronic bronchitis: Secondary | ICD-10-CM | POA: Diagnosis not present

## 2016-07-10 DIAGNOSIS — Z0000 Encounter for general adult medical examination without abnormal findings: Secondary | ICD-10-CM | POA: Diagnosis not present

## 2016-07-10 DIAGNOSIS — Z131 Encounter for screening for diabetes mellitus: Secondary | ICD-10-CM | POA: Diagnosis not present

## 2016-07-18 DIAGNOSIS — I10 Essential (primary) hypertension: Secondary | ICD-10-CM | POA: Diagnosis not present

## 2016-07-18 DIAGNOSIS — E785 Hyperlipidemia, unspecified: Secondary | ICD-10-CM | POA: Diagnosis not present

## 2016-07-18 DIAGNOSIS — J42 Unspecified chronic bronchitis: Secondary | ICD-10-CM | POA: Diagnosis not present

## 2016-07-18 DIAGNOSIS — G629 Polyneuropathy, unspecified: Secondary | ICD-10-CM | POA: Diagnosis not present

## 2016-07-19 ENCOUNTER — Emergency Department (HOSPITAL_BASED_OUTPATIENT_CLINIC_OR_DEPARTMENT_OTHER)
Admission: EM | Admit: 2016-07-19 | Discharge: 2016-07-19 | Disposition: A | Discharge status: Home / Self Care | Payer: Medicare Other | Attending: Emergency Medicine | Admitting: Emergency Medicine

## 2016-07-19 ENCOUNTER — Encounter (HOSPITAL_BASED_OUTPATIENT_CLINIC_OR_DEPARTMENT_OTHER): Payer: Self-pay | Admitting: Emergency Medicine

## 2016-07-19 ENCOUNTER — Emergency Department (HOSPITAL_BASED_OUTPATIENT_CLINIC_OR_DEPARTMENT_OTHER): Payer: Medicare Other

## 2016-07-19 ENCOUNTER — Telehealth: Payer: Self-pay | Admitting: Pulmonary Disease

## 2016-07-19 DIAGNOSIS — I10 Essential (primary) hypertension: Secondary | ICD-10-CM | POA: Diagnosis not present

## 2016-07-19 DIAGNOSIS — J189 Pneumonia, unspecified organism: Secondary | ICD-10-CM | POA: Diagnosis not present

## 2016-07-19 DIAGNOSIS — J449 Chronic obstructive pulmonary disease, unspecified: Secondary | ICD-10-CM | POA: Diagnosis not present

## 2016-07-19 DIAGNOSIS — R079 Chest pain, unspecified: Secondary | ICD-10-CM | POA: Diagnosis not present

## 2016-07-19 DIAGNOSIS — Z79899 Other long term (current) drug therapy: Secondary | ICD-10-CM | POA: Insufficient documentation

## 2016-07-19 DIAGNOSIS — Z87891 Personal history of nicotine dependence: Secondary | ICD-10-CM | POA: Diagnosis not present

## 2016-07-19 DIAGNOSIS — R05 Cough: Secondary | ICD-10-CM | POA: Diagnosis not present

## 2016-07-19 LAB — BASIC METABOLIC PANEL
ANION GAP: 7 (ref 5–15)
BUN: 11 mg/dL (ref 6–20)
CALCIUM: 9.8 mg/dL (ref 8.9–10.3)
CHLORIDE: 96 mmol/L — AB (ref 101–111)
CO2: 41 mmol/L — AB (ref 22–32)
Creatinine, Ser: 0.39 mg/dL — ABNORMAL LOW (ref 0.44–1.00)
GFR calc Af Amer: >60 mL/min (ref >60)
GFR calc non Af Amer: >60 mL/min (ref >60)
GLUCOSE: 112 mg/dL — AB (ref 65–99)
Potassium: 3.7 mmol/L (ref 3.5–5.1)
Sodium: 144 mmol/L (ref 135–145)

## 2016-07-19 LAB — CBC WITH DIFFERENTIAL/PLATELET
Basophils Absolute: 0 10*3/uL (ref 0.0–0.1)
Basophils Relative: 0 %
Eosinophils Absolute: 0 10*3/uL (ref 0.0–0.7)
Eosinophils Relative: 0 %
HEMATOCRIT: 35.4 % — AB (ref 36.0–46.0)
HEMOGLOBIN: 10 g/dL — AB (ref 12.0–15.0)
LYMPHS ABS: 0.9 10*3/uL (ref 0.7–4.0)
LYMPHS PCT: 8 %
MCH: 22.7 pg — AB (ref 26.0–34.0)
MCHC: 28.2 g/dL — AB (ref 30.0–36.0)
MCV: 80.5 fL (ref 78.0–100.0)
MONO ABS: 0.9 10*3/uL (ref 0.1–1.0)
MONOS PCT: 8 %
NEUTROS ABS: 9.1 10*3/uL — AB (ref 1.7–7.7)
Neutrophils Relative %: 84 %
Platelets: 228 10*3/uL (ref 150–400)
RBC: 4.4 MIL/uL (ref 3.87–5.11)
RDW: 15.6 % — AB (ref 11.5–15.5)
WBC: 10.9 10*3/uL — ABNORMAL HIGH (ref 4.0–10.5)

## 2016-07-19 MED ORDER — ALBUTEROL SULFATE (2.5 MG/3ML) 0.083% IN NEBU
5.0000 mg | INHALATION_SOLUTION | Freq: Once | RESPIRATORY_TRACT | Status: AC
  Administered 2016-07-19: 5 mg via RESPIRATORY_TRACT
  Filled 2016-07-19: qty 6

## 2016-07-19 MED ORDER — PREDNISONE 20 MG PO TABS
ORAL_TABLET | ORAL | Status: AC
  Filled 2016-07-19: qty 1

## 2016-07-19 MED ORDER — PREDNISONE 20 MG PO TABS
40.0000 mg | ORAL_TABLET | Freq: Once | ORAL | Status: AC
  Administered 2016-07-19: 40 mg via ORAL
  Filled 2016-07-19: qty 2

## 2016-07-19 MED ORDER — LEVOFLOXACIN 500 MG PO TABS
500.0000 mg | ORAL_TABLET | Freq: Every day | ORAL | 0 refills | Status: DC
Start: 2016-07-19 — End: 2016-07-21

## 2016-07-19 MED ORDER — PREDNISONE 20 MG PO TABS: 40.0000 mg | ORAL_TABLET | Freq: Every day | ORAL | 0 refills | Status: DC

## 2016-07-19 MED ORDER — IPRATROPIUM BROMIDE 0.02 % IN SOLN
0.5000 mg | Freq: Once | RESPIRATORY_TRACT | Status: AC
  Administered 2016-07-19: 0.5 mg via RESPIRATORY_TRACT
  Filled 2016-07-19: qty 2.5

## 2016-07-19 NOTE — ED Notes (Signed)
Patient transported to X-ray 

## 2016-07-19 NOTE — ED Notes (Signed)
Pt states she has had this pain for 5 years and it keeps getting worse, pt came into ED today because she was SOB with the pain.

## 2016-07-19 NOTE — ED Triage Notes (Signed)
Patient states that she is having left under breast pain with increased WOB - the patient reports that she has stayed on the same amount on her O2 - she has a a congested cough, denies any need to increase her O2 usage. Patient reports increased SOB with walking around and getting to chair. The patient is speaking short sentences in triage.

## 2016-07-19 NOTE — ED Provider Notes (Signed)
[facility] DEPT [facility] Provider Note   CSN: [id number] Arrival date & time: 07/19/16  1654  By signing my name below, I, Neta Mends, attest that this documentation has been prepared under the direction and in the presence of Davonna Belling, MD . Electronically Signed: Neta Mends, ED Scribe. 07/19/2016. 5:35 PM.    History   Chief Complaint Chief Complaint  Patient presents with  . Chest Pain   The history is provided by the patient. No language interpreter was used.   HPI Comments:  Holly Page is a 80 y.o. female with PMHx of COPT, DVT, and HTN who presents to the Emergency Department complaining of constant left-sided chest pain that began 1 week ago. Pt states that the pain is primarily under her left breast. Pt complains of associated congestion, productive cough, SOB. Pt currently uses 4L NCO2 at home. Pt states that her SOB is worsened when walking and when getting into her wheelchair. Pt states that she has taken prednisone that has provided temporary relief. Pt has also been doing breathing treatments with no relief. Pt denies sick contact. Pt denies other associated symptoms.   Past Medical History:  Diagnosis Date  . COPD (chronic obstructive pulmonary disease) ([place])   . DVT (deep venous thrombosis) ([place]) 1980s, recurrent 2015   Right DVT 2015  on xarelto  . GERD (gastroesophageal reflux disease)   . Hypertension   . Lactose intolerance   . Meningioma ([place])    followed by Dr Christella Noa  . Pneumonia   . Spinal stenosis     Patient Active Problem List   Diagnosis Date Noted  . Chronic respiratory failure ([place]) 04/10/2016  . COPD with exacerbation ([place]) 03/19/2016  . RBC microcytosis 03/19/2016  . Oxygen dependent 12/31/2015  . Anxiety 11/08/2015  . Hyperthyroidism 09/07/2015  . Malnutrition of moderate degree 08/31/2015  . Abnormal LFTs 03/19/2015  . Back pain 03/19/2015  . HTN (hypertension) 03/01/2015  . Anemia 12/19/2014  . Calculus  of kidney 08/30/2014  . Hematuria 08/11/2014  . History of DVT (deep vein thrombosis)   . Neuralgia neuritis, sciatic nerve 05/22/2014  . 1st degree AV block 04/24/2014  . History of colon polyps 04/24/2014  . Adaptive colitis 04/24/2014  . APC (atrial premature contractions) 04/24/2014  . Benign neoplasm of meninges ([place]) 02/10/2014  . Barrett esophagus 02/03/2014  . Spinal stenosis 12/15/2013  . Allergic rhinitis 11/21/2013  . COPD with emphysema Gold D   . GERD (gastroesophageal reflux disease)   . Lactose intolerance   . DDD (degenerative disc disease), lumbosacral 06/11/2010  . Diffuse cerebrovascular disease 06/11/2010    Past Surgical History:  Procedure Laterality Date  . CHOLECYSTECTOMY    . FOOT SURGERY    . TONSILLECTOMY    . TUBAL LIGATION      OB History    No data available       Home Medications    Prior to Admission medications   Medication Sig Start Date End Date Taking? Authorizing Provider  acetaminophen-codeine (TYLENOL #3) 300-30 MG tablet Take 1-2 tablets by mouth every 8 (eight) hours as needed for moderate pain or severe pain. 05/12/16   Darreld Mclean, MD  albuterol (PROVENTIL HFA;VENTOLIN HFA) 108 (90 Base) MCG/ACT inhaler Inhale 2 puffs into the lungs every 6 (six) hours as needed for wheezing or shortness of breath. 02/13/16   Tammy S Parrett, NP  albuterol (PROVENTIL) (2.5 MG/3ML) 0.083% nebulizer solution TAKE 3 ML'S BY NEBULIZATION EVERY 6 HOURS AS NEEDED  FOR SHORTNESS OF BREATH AND AS NEEDED 02/08/16   Rigoberto Noel, MD  ALPRAZolam Duanne Moron) 0.25 MG tablet Take 1/2 - 1 tablet by mouth daily as needed 11/08/15   Tammy S Parrett, NP  budesonide (PULMICORT) 0.25 MG/2ML nebulizer solution Take 2 mLs (0.25 mg total) by nebulization 2 (two) times daily. DX: J43.9 Patient not taking: Reported on 07/03/2016 06/20/16   Fonnie Mu Parrett, NP  cholecalciferol (VITAMIN D) 1000 UNITS tablet Take 1,000 Units by mouth daily.    Historical Provider, MD  feeding  supplement (BOOST / RESOURCE BREEZE) LIQD Take 1 Container by mouth 3 (three) times daily between meals. 03/21/16   Lavina Hamman, MD  Ferrous Sulfate (IRON SLOW RELEASE) 143 (45 Fe) MG TBCR Take by mouth daily.    Historical Provider, MD  fluticasone furoate-vilanterol (BREO ELLIPTA) 100-25 MCG/INH AEPB Inhale 1 puff into the lungs daily. 07/03/16 08/02/16  Rigoberto Noel, MD  levofloxacin (LEVAQUIN) 500 MG tablet Take 1 tablet (500 mg total) by mouth daily. 07/19/16   Davonna Belling, MD  Multiple Vitamins-Minerals (MULTIVITAMIN & MINERAL PO) Take 1 tablet by mouth daily.    Historical Provider, MD  OXYGEN Inhale 3 L/hr into the lungs.    Historical Provider, MD  potassium chloride SA (K-DUR,KLOR-CON) 20 MEQ tablet Take 1 tablet (20 mEq total) by mouth daily. 04/18/16   Gay Filler Copland, MD  predniSONE (DELTASONE) 20 MG tablet Take 2 tablets (40 mg total) by mouth daily. 07/19/16   Davonna Belling, MD  ranitidine (ZANTAC) 300 MG tablet Take 1 tablet (300 mg total) by mouth at bedtime. 1 tablet at bedtime 04/07/16 04/07/17  Gay Filler Copland, MD  sodium chloride (OCEAN) 0.65 % SOLN nasal spray Place 2 sprays into both nostrils as needed for congestion. Reported on 12/31/2015    Historical Provider, MD  solifenacin (VESICARE) 5 MG tablet Take 5 mg by mouth daily. Pt does not take daily (takes PRN)    Historical Provider, MD  verapamil (CALAN-SR) 240 MG CR tablet Take 2 tablets (480 mg total) by mouth daily. Patient taking differently: Take 480 mg by mouth daily. Alternate between [1] daily and [2] daily 01/09/16   Darreld Mclean, MD    Family History Family History  Problem Relation Age of Onset  . COPD Father     smoker deceased.   . Lupus Sister   . Heart attack Son     Social History Social History  Substance Use Topics  . Smoking status: Former Smoker    Packs/day: 1.50    Years: 56.00    Types: Cigarettes    Quit date: 04/29/2012  . Smokeless tobacco: Never Used     Comment: quit  smoking 3 years ago  . Alcohol use No     Comment: quit drinking beer 2005     Allergies   Incruse ellipta [umeclidinium bromide]; Tramadol; Amlodipine; Aspirin; Codeine; Doxycycline; Hydrocodone; Losartan; Propoxyphene; Gabapentin; and Tiotropium   Review of Systems Review of Systems  HENT: Positive for congestion.   Respiratory: Positive for cough, chest tightness and shortness of breath.   Cardiovascular: Positive for chest pain.  All other systems reviewed and are negative.    Physical Exam Updated Vital Signs BP 141/63   Pulse 76   Temp 98.8 F (37.1 C) (Oral)   Resp 18   Ht 5\' 5"  (1.651 m)   Wt 101 lb (45.8 kg)   SpO2 100%   BMI 16.81 kg/m   Physical Exam  Constitutional: She  appears well-developed and well-nourished. No distress.  HENT:  Head: Normocephalic and atraumatic.  Eyes: Conjunctivae are normal.  Neck: Neck supple.  Cardiovascular: Normal rate and regular rhythm.   No murmur heard. Pulmonary/Chest: Effort normal. No respiratory distress. She has wheezes.  Pursed lip breathing. Tachypnea. Mild diffuse wheezing, prolonged expirations. Rare crackles at left base.   Abdominal: Soft. There is no tenderness.  Musculoskeletal: She exhibits no edema.  Neurological: She is alert.  Skin: Skin is warm and dry.  Psychiatric: She has a normal mood and affect.  Nursing note and vitals reviewed.    ED Treatments / Results  DIAGNOSTIC STUDIES:  Oxygen Saturation is 90% on NCO2, adequate by my interpretation.    COORDINATION OF CARE:  5:35 PM Discussed treatment plan with pt at bedside and pt agreed to plan.   Labs (all labs ordered are listed, but only abnormal results are displayed) Labs Reviewed  BASIC METABOLIC PANEL - Abnormal; Notable for the following:       Result Value   Chloride 96 (*)    CO2 41 (*)    Glucose, Bld 112 (*)    Creatinine, Ser 0.39 (*)    All other components within normal limits  CBC WITH DIFFERENTIAL/PLATELET - Abnormal;  Notable for the following:    WBC 10.9 (*)    Hemoglobin 10.0 (*)    HCT 35.4 (*)    MCH 22.7 (*)    MCHC 28.2 (*)    RDW 15.6 (*)    Neutro Abs 9.1 (*)    All other components within normal limits  CBC WITH DIFFERENTIAL/PLATELET    EKG  EKG Interpretation  Date/Time:  Saturday July 19 2016 17:04:06 EDT Ventricular Rate:  82 PR Interval:  170 QRS Duration: 72 QT Interval:  492 QTC Calculation: 574 R Axis:   89 Text Interpretation:  Normal sinus rhythm Minimal voltage criteria for LVH, may be normal variant Nonspecific T wave abnormality Abnormal ECG No significant change since last tracing Confirmed by Alvino Chapel  MD, Ovid Curd [phone]) on 07/19/2016 5:19:18 PM       Radiology Dg Chest 2 View  Result Date: 07/19/2016 CLINICAL DATA:  Left-sided chest pain 5 years worse over the past week under left breast. Productive cough which shortness-of-breath and congestion. Oxygen dependent. EXAM: CHEST  2 VIEW COMPARISON:  03/26/2016 FINDINGS: Lungs are hyperinflated with flattening of the hemidiaphragms. Mild left basilar interstitial prominence unchanged. Possible patchy opacification in the posterior left base which may be due to atelectasis or infection. Cardiomediastinal silhouette within normal. Calcified plaque over the aortic arch. Remainder of the exam unchanged. IMPRESSION: Mild patchy density in the left base which may be due to atelectasis or infection. Emphysematous disease with subtle chronic bibasilar interstitial prominence. Aortic atherosclerosis Electronically Signed   By: Marin Olp M.D.   On: 07/19/2016 19:10    Procedures Procedures (including critical care time)  Medications Ordered in ED Medications  predniSONE (DELTASONE) tablet 40 mg (not administered)  predniSONE (DELTASONE) 20 MG tablet (not administered)  albuterol (PROVENTIL) (2.5 MG/3ML) 0.083% nebulizer solution 5 mg (5 mg Nebulization Given 07/19/16 1819)  ipratropium (ATROVENT) nebulizer solution 0.5  mg (0.5 mg Nebulization Given 07/19/16 1819)     Initial Impression / Assessment and Plan / ED Course  I have reviewed the triage vital signs and the nursing notes.  Pertinent labs & imaging results that were available during my care of the patient were reviewed by me and considered in my medical decision making (see chart  for details).  Clinical Course    Patient with shortness of breath and cough. Found a possible pneumonia on x-ray. History of COPD and increased sputum production so we'll treat it. Given antibiotics. Levaquin chosen since has other allergies to doxycycline. We'll give 4 day course of steroids too.  Final Clinical Impressions(s) / ED Diagnoses   Final diagnoses:  Community acquired pneumonia, unspecified laterality  Chronic obstructive pulmonary disease, unspecified COPD type ([place])    New Prescriptions New Prescriptions   LEVOFLOXACIN (LEVAQUIN) 500 MG TABLET    Take 1 tablet (500 mg total) by mouth daily.   PREDNISONE (DELTASONE) 20 MG TABLET    Take 2 tablets (40 mg total) by mouth daily.  I personally performed the services described in this documentation, which was scribed in my presence. The recorded information has been reviewed and is accurate.     Davonna Belling, MD 07/19/16 2012

## 2016-07-19 NOTE — Telephone Encounter (Signed)
Called by [place] who relates that she has increased SOB with wheezing. No fever, no sputum production, chills, sweats or hemoptysis. Hx of COPD and DVT. Prescription for Prednisone 10 mg, # 20, Sig 4 tabs X 2 days, 3 tabs X 2 days, 2 tabs X 2 days and 1 tab X 2 days. Patient instructed to call the office Monday morning for F/U appointment with Dr. Elsworth Soho. Patient further instructed to go to the Emergency Department for evaluation and possible admission should her breathing worsen in spite of Prednisone Rx.

## 2016-07-19 NOTE — ED Notes (Signed)
Pt able to converse without difficulty, PO2 91%, HR 87 at nurse first desk.  Pt normally on 3-4L O2 at home.  Presently receiving 4L.  Triage RN notified.

## 2016-07-21 ENCOUNTER — Encounter (HOSPITAL_BASED_OUTPATIENT_CLINIC_OR_DEPARTMENT_OTHER): Payer: Self-pay | Admitting: *Deleted

## 2016-07-21 ENCOUNTER — Emergency Department (HOSPITAL_BASED_OUTPATIENT_CLINIC_OR_DEPARTMENT_OTHER): Payer: Medicare Other

## 2016-07-21 ENCOUNTER — Inpatient Hospital Stay (HOSPITAL_BASED_OUTPATIENT_CLINIC_OR_DEPARTMENT_OTHER)
Admission: EM | Admit: 2016-07-21 | Discharge: 2016-07-24 | DRG: 190 | Disposition: A | Discharge status: Home / Self Care | Payer: Medicare Other | Attending: Internal Medicine | Admitting: Internal Medicine

## 2016-07-21 DIAGNOSIS — J441 Chronic obstructive pulmonary disease with (acute) exacerbation: Principal | ICD-10-CM | POA: Diagnosis present

## 2016-07-21 DIAGNOSIS — I10 Essential (primary) hypertension: Secondary | ICD-10-CM | POA: Diagnosis present

## 2016-07-21 DIAGNOSIS — Z9981 Dependence on supplemental oxygen: Secondary | ICD-10-CM

## 2016-07-21 DIAGNOSIS — Z885 Allergy status to narcotic agent status: Secondary | ICD-10-CM | POA: Diagnosis not present

## 2016-07-21 DIAGNOSIS — D509 Iron deficiency anemia, unspecified: Secondary | ICD-10-CM | POA: Diagnosis not present

## 2016-07-21 DIAGNOSIS — I253 Aneurysm of heart: Secondary | ICD-10-CM | POA: Diagnosis not present

## 2016-07-21 DIAGNOSIS — Z86718 Personal history of other venous thrombosis and embolism: Secondary | ICD-10-CM | POA: Diagnosis not present

## 2016-07-21 DIAGNOSIS — K219 Gastro-esophageal reflux disease without esophagitis: Secondary | ICD-10-CM | POA: Diagnosis not present

## 2016-07-21 DIAGNOSIS — Z888 Allergy status to other drugs, medicaments and biological substances status: Secondary | ICD-10-CM

## 2016-07-21 DIAGNOSIS — J9622 Acute and chronic respiratory failure with hypercapnia: Secondary | ICD-10-CM | POA: Diagnosis not present

## 2016-07-21 DIAGNOSIS — R0902 Hypoxemia: Secondary | ICD-10-CM

## 2016-07-21 DIAGNOSIS — Z8249 Family history of ischemic heart disease and other diseases of the circulatory system: Secondary | ICD-10-CM | POA: Diagnosis not present

## 2016-07-21 DIAGNOSIS — I272 Pulmonary hypertension, unspecified: Secondary | ICD-10-CM | POA: Diagnosis not present

## 2016-07-21 DIAGNOSIS — R0602 Shortness of breath: Secondary | ICD-10-CM | POA: Diagnosis not present

## 2016-07-21 DIAGNOSIS — Z886 Allergy status to analgesic agent status: Secondary | ICD-10-CM

## 2016-07-21 DIAGNOSIS — Z87891 Personal history of nicotine dependence: Secondary | ICD-10-CM | POA: Diagnosis not present

## 2016-07-21 DIAGNOSIS — E43 Unspecified severe protein-calorie malnutrition: Secondary | ICD-10-CM | POA: Diagnosis not present

## 2016-07-21 DIAGNOSIS — J9621 Acute and chronic respiratory failure with hypoxia: Secondary | ICD-10-CM | POA: Diagnosis not present

## 2016-07-21 DIAGNOSIS — Z681 Body mass index (BMI) 19 or less, adult: Secondary | ICD-10-CM

## 2016-07-21 DIAGNOSIS — J440 Chronic obstructive pulmonary disease with acute lower respiratory infection: Secondary | ICD-10-CM | POA: Diagnosis not present

## 2016-07-21 DIAGNOSIS — J189 Pneumonia, unspecified organism: Secondary | ICD-10-CM | POA: Diagnosis present

## 2016-07-21 DIAGNOSIS — Z881 Allergy status to other antibiotic agents status: Secondary | ICD-10-CM

## 2016-07-21 DIAGNOSIS — Z86011 Personal history of benign neoplasm of the brain: Secondary | ICD-10-CM | POA: Diagnosis not present

## 2016-07-21 DIAGNOSIS — E739 Lactose intolerance, unspecified: Secondary | ICD-10-CM | POA: Diagnosis present

## 2016-07-21 DIAGNOSIS — F419 Anxiety disorder, unspecified: Secondary | ICD-10-CM | POA: Diagnosis present

## 2016-07-21 DIAGNOSIS — Z825 Family history of asthma and other chronic lower respiratory diseases: Secondary | ICD-10-CM | POA: Diagnosis not present

## 2016-07-21 DIAGNOSIS — E440 Moderate protein-calorie malnutrition: Secondary | ICD-10-CM | POA: Diagnosis present

## 2016-07-21 DIAGNOSIS — Z79899 Other long term (current) drug therapy: Secondary | ICD-10-CM

## 2016-07-21 DIAGNOSIS — R718 Other abnormality of red blood cells: Secondary | ICD-10-CM | POA: Diagnosis present

## 2016-07-21 DIAGNOSIS — Z7952 Long term (current) use of systemic steroids: Secondary | ICD-10-CM

## 2016-07-21 DIAGNOSIS — Z7951 Long term (current) use of inhaled steroids: Secondary | ICD-10-CM

## 2016-07-21 LAB — PROTIME-INR
INR: 1.13
Prothrombin Time: 14.6 seconds (ref 11.4–15.2)

## 2016-07-21 LAB — COMPREHENSIVE METABOLIC PANEL
ALK PHOS: 59 U/L (ref 38–126)
ALT: 22 U/L (ref 14–54)
ANION GAP: 5 (ref 5–15)
AST: 48 U/L — ABNORMAL HIGH (ref 15–41)
Albumin: 3.8 g/dL (ref 3.5–5.0)
BILIRUBIN TOTAL: 0.4 mg/dL (ref 0.3–1.2)
BUN: 11 mg/dL (ref 6–20)
CALCIUM: 9.8 mg/dL (ref 8.9–10.3)
CO2: 40 mmol/L — ABNORMAL HIGH (ref 22–32)
Chloride: 98 mmol/L — ABNORMAL LOW (ref 101–111)
Creatinine, Ser: 0.44 mg/dL (ref 0.44–1.00)
GFR calc non Af Amer: >60 mL/min (ref >60)
Glucose, Bld: 116 mg/dL — ABNORMAL HIGH (ref 65–99)
Potassium: 3.7 mmol/L (ref 3.5–5.1)
SODIUM: 143 mmol/L (ref 135–145)
TOTAL PROTEIN: 6.9 g/dL (ref 6.5–8.1)

## 2016-07-21 LAB — CBC WITH DIFFERENTIAL/PLATELET
BASOS ABS: 0 10*3/uL (ref 0.0–0.1)
Basophils Relative: 0 %
EOS ABS: 0 10*3/uL (ref 0.0–0.7)
EOS PCT: 0 %
HCT: 33.2 % — ABNORMAL LOW (ref 36.0–46.0)
Hemoglobin: 9.6 g/dL — ABNORMAL LOW (ref 12.0–15.0)
LYMPHS PCT: 6 %
Lymphs Abs: 0.6 10*3/uL — ABNORMAL LOW (ref 0.7–4.0)
MCH: 22.9 pg — ABNORMAL LOW (ref 26.0–34.0)
MCHC: 28.9 g/dL — ABNORMAL LOW (ref 30.0–36.0)
MCV: 79 fL (ref 78.0–100.0)
MONO ABS: 0.2 10*3/uL (ref 0.1–1.0)
Monocytes Relative: 2 %
Neutro Abs: 8.2 10*3/uL — ABNORMAL HIGH (ref 1.7–7.7)
Neutrophils Relative %: 92 %
PLATELETS: 303 10*3/uL (ref 150–400)
RBC: 4.2 MIL/uL (ref 3.87–5.11)
RDW: 15.7 % — AB (ref 11.5–15.5)
WBC: 9 10*3/uL (ref 4.0–10.5)

## 2016-07-21 LAB — I-STAT VENOUS BLOOD GAS, ED
ACID-BASE EXCESS: 21 mmol/L — AB (ref 0.0–2.0)
Bicarbonate: 49.7 mmol/L — ABNORMAL HIGH (ref 20.0–28.0)
O2 SAT: 31 %
PCO2 VEN: 83.4 mmHg — AB (ref 44.0–60.0)
Patient temperature: 98.6
TCO2: >50 mmol/L (ref 0–100)
pH, Ven: 7.383 (ref 7.250–7.430)
pO2, Ven: 22 mmHg — CL (ref 32.0–45.0)

## 2016-07-21 LAB — PROCALCITONIN

## 2016-07-21 LAB — TROPONIN I: Troponin I: 0.13 ng/mL (ref <0.03)

## 2016-07-21 MED ORDER — ACETAMINOPHEN 650 MG RE SUPP: 650.0000 mg | Freq: Four times a day (QID) | RECTAL | Status: DC | PRN

## 2016-07-21 MED ORDER — ACETAMINOPHEN-CODEINE #3 300-30 MG PO TABS
1.0000 | ORAL_TABLET | Freq: Three times a day (TID) | ORAL | Status: DC | PRN
  Administered 2016-07-22: 1 via ORAL
  Filled 2016-07-21: qty 1

## 2016-07-21 MED ORDER — ACETAMINOPHEN 325 MG PO TABS: 650.0000 mg | ORAL_TABLET | Freq: Four times a day (QID) | ORAL | Status: DC | PRN

## 2016-07-21 MED ORDER — PREDNISONE 50 MG PO TABS
60.0000 mg | ORAL_TABLET | Freq: Once | ORAL | Status: AC
  Administered 2016-07-21: 60 mg via ORAL
  Filled 2016-07-21: qty 1

## 2016-07-21 MED ORDER — BOOST / RESOURCE BREEZE PO LIQD
1.0000 | Freq: Three times a day (TID) | ORAL | Status: DC
  Administered 2016-07-21 – 2016-07-22 (×2): 1 via ORAL

## 2016-07-21 MED ORDER — ENOXAPARIN SODIUM 40 MG/0.4ML ~~LOC~~ SOLN
40.0000 mg | SUBCUTANEOUS | Status: DC
  Administered 2016-07-21 – 2016-07-23 (×3): 40 mg via SUBCUTANEOUS
  Filled 2016-07-21 (×3): qty 0.4

## 2016-07-21 MED ORDER — ALBUTEROL SULFATE (2.5 MG/3ML) 0.083% IN NEBU: 2.5000 mg | INHALATION_SOLUTION | RESPIRATORY_TRACT | Status: DC | PRN

## 2016-07-21 MED ORDER — AZITHROMYCIN 500 MG PO TABS
250.0000 mg | ORAL_TABLET | Freq: Every day | ORAL | Status: DC
  Administered 2016-07-22 – 2016-07-23 (×2): 250 mg via ORAL
  Filled 2016-07-21 (×2): qty 1

## 2016-07-21 MED ORDER — POTASSIUM CHLORIDE CRYS ER 20 MEQ PO TBCR
20.0000 meq | EXTENDED_RELEASE_TABLET | Freq: Every day | ORAL | Status: DC
  Administered 2016-07-22 – 2016-07-24 (×3): 20 meq via ORAL
  Filled 2016-07-21 (×3): qty 1

## 2016-07-21 MED ORDER — FERROUS SULFATE 325 (65 FE) MG PO TABS
ORAL_TABLET | Freq: Every day | ORAL | Status: DC
  Administered 2016-07-21 – 2016-07-24 (×4): 325 mg via ORAL
  Filled 2016-07-21 (×4): qty 1

## 2016-07-21 MED ORDER — VERAPAMIL HCL ER 240 MG PO TBCR
480.0000 mg | EXTENDED_RELEASE_TABLET | ORAL | Status: DC
  Administered 2016-07-23: 480 mg via ORAL
  Filled 2016-07-21: qty 2

## 2016-07-21 MED ORDER — VERAPAMIL HCL ER 240 MG PO TBCR: 240.0000 mg | EXTENDED_RELEASE_TABLET | Freq: Every day | ORAL | Status: DC

## 2016-07-21 MED ORDER — ALBUTEROL SULFATE (2.5 MG/3ML) 0.083% IN NEBU
2.5000 mg | INHALATION_SOLUTION | Freq: Four times a day (QID) | RESPIRATORY_TRACT | Status: DC
  Administered 2016-07-21 – 2016-07-23 (×9): 2.5 mg via RESPIRATORY_TRACT
  Filled 2016-07-21 (×9): qty 3

## 2016-07-21 MED ORDER — AZITHROMYCIN 250 MG PO TABS
500.0000 mg | ORAL_TABLET | Freq: Once | ORAL | Status: AC
  Administered 2016-07-21: 500 mg via ORAL
  Filled 2016-07-21: qty 2

## 2016-07-21 MED ORDER — VERAPAMIL HCL ER 240 MG PO TBCR
240.0000 mg | EXTENDED_RELEASE_TABLET | ORAL | Status: DC
  Administered 2016-07-22 – 2016-07-24 (×2): 240 mg via ORAL
  Filled 2016-07-21 (×2): qty 1

## 2016-07-21 MED ORDER — PREDNISONE 20 MG PO TABS
40.0000 mg | ORAL_TABLET | Freq: Every day | ORAL | Status: DC
  Administered 2016-07-22: 40 mg via ORAL
  Filled 2016-07-21: qty 2

## 2016-07-21 MED ORDER — FAMOTIDINE 20 MG PO TABS
40.0000 mg | ORAL_TABLET | Freq: Every day | ORAL | Status: DC
  Administered 2016-07-21 – 2016-07-23 (×3): 40 mg via ORAL
  Filled 2016-07-21 (×3): qty 2

## 2016-07-21 MED ORDER — FLUTICASONE FUROATE-VILANTEROL 100-25 MCG/INH IN AEPB
1.0000 | INHALATION_SPRAY | Freq: Every day | RESPIRATORY_TRACT | Status: DC
  Administered 2016-07-22 – 2016-07-24 (×3): 1 via RESPIRATORY_TRACT
  Filled 2016-07-21 (×2): qty 28

## 2016-07-21 MED ORDER — DARIFENACIN HYDROBROMIDE ER 7.5 MG PO TB24
7.5000 mg | ORAL_TABLET | Freq: Every day | ORAL | Status: DC | PRN
  Filled 2016-07-21: qty 1

## 2016-07-21 NOTE — H&P (Addendum)
History and Physical  Patient Name: Holly Page     [id number]    DOB: 05/05/1936    DOA: 07/21/2016 PCP: Benito Mccreedy, MD   Patient coming from: Home  Chief Complaint: Cough, shortness of breah  HPI: Holly Page is a 80 y.o. female with a past medical history significant for COPD GOLD D, FEV1 30% on 3L home O2, HTN and recurrent DVT no longer on [facility] who presents with shortness of breath with wheezing and cough for 4 weeks, now worsening.  The patient first developed increasing shortness of breath with exertion about 3-4 weeks ago.  She saw her pulmonologist around that time, but didn't complain of increased dyspnea, and "he listened to my lungs but didn't say anything".  At baseline she can ambulate around her house without difficulty, but in the last month has been unable to walk across the room without having to stop for breath.  Several days ago she called her Pulmology office and was prescribed prednisone taper by phone and was told to go to the ER if her symptoms worsened.  Two days ago she went to the ER (she had not started prednisone yet) for SOB, cough.  There she was diagnosed with pneumonia and COPD flare and prescribed prednisone 40 mg for 5 days and Levaquin.    She took the Levaquin once on Sunday but it caused headache and ankle pain so she stopped.  She took prednisone yesterday and today, but returned to the ER today because her symptoms were not improved.  She had some subjective fevers and chills last week, no sputum.  She has had increased cough and dyspnea on exertion and wheezing, worsening in the last two days.    ED course: -Afebrile, heart rate 70s, respirations 30s initially (and portable O2 not triggering per RT note, so SpO2 77% on home O2 tank), and BP normal -Na 143, K 3.7, Cr 0.44 (baselin), WBC 9K, Hgb 9.6 (baseline 10-11 and microcytic) -Troponin negative -ECG showed no ischemic changes -CXR showed no progression of opacity noted two days  ago -Mild wheezing was present -She was given azithromycin and oral prednisone and TR[facility] were asked to accept in transfer for COPD flare failed outpatient therapy   She uses a home O2 concentrator with continuous O2, and portable tank with pulsed O2.  She has chronic intermittent left sided chest discomfort for which she has undergone NM perfusion testing in the past that was normal and is unchanged today (dull, constant when present, worsened with her PPI, not worsened with exertion, palpation, movement, position, or food).          ROS: Review of Systems  Constitutional: Positive for chills and fever (subjective).  HENT: Negative for congestion and sore throat.   Respiratory: Positive for cough, shortness of breath and wheezing. Negative for hemoptysis and sputum production.   Cardiovascular: Positive for chest pain. Negative for orthopnea and leg swelling.  Gastrointestinal: Positive for nausea. Negative for abdominal pain, blood in stool (notes "flecks of maybe red blood"), constipation, diarrhea and melena.  All other systems reviewed and are negative.         Past Medical History:  Diagnosis Date  . COPD (chronic obstructive pulmonary disease) ([place])   . DVT (deep venous thrombosis) ([place]) 1980s, recurrent 2015   Right DVT 2015  on xarelto  . GERD (gastroesophageal reflux disease)   . Hypertension   . Lactose intolerance   . Meningioma ([place])    followed by Dr Christella Noa  .  Pneumonia   . Spinal stenosis     Past Surgical History:  Procedure Laterality Date  . CHOLECYSTECTOMY    . FOOT SURGERY    . TONSILLECTOMY    . TUBAL LIGATION      Social History: Patient lives alone, sometimes her son is with her.  She gets meals on wheels.  The patient walks unassisted at home, uses a wheelchair outside the home.  Former smoker, quit 4 years ago.  No dementia.    Allergies  Allergen Reactions  . Incruse Ellipta [Umeclidinium Bromide] Shortness Of Breath  . Tramadol  Shortness Of Breath  . Amlodipine     Other reaction(s): SWELLING  . Aspirin     Other reaction(s): PALPITATIONS Other reaction(s): DIFFICULTY BREATHING Heart flutter  . Codeine Other (See Comments)    Hallucinations, can take Tylenol #3 now  . Doxycycline     Other reaction(s): NAUSEA,VOMITING  . Hydrocodone     hallucinations  . Losartan     unknown  . Propoxyphene Nausea Only    Other reaction(s): NAUSEA  . Gabapentin     Other reaction(s): Other (See Comments) She could not swallow the large pill  . Tiotropium Itching and Rash    Family history: family history includes COPD in her father; Heart attack in her son; Lupus in her sister.  Prior to Admission medications   Medication Sig Start Date End Date Taking? Authorizing Provider  acetaminophen-codeine (TYLENOL #3) 300-30 MG tablet Take 1-2 tablets by mouth every 8 (eight) hours as needed for moderate pain or severe pain. 05/12/16   Darreld Mclean, MD  albuterol (PROVENTIL HFA;VENTOLIN HFA) 108 (90 Base) MCG/ACT inhaler Inhale 2 puffs into the lungs every 6 (six) hours as needed for wheezing or shortness of breath. 02/13/16   Tammy S Parrett, NP  albuterol (PROVENTIL) (2.5 MG/3ML) 0.083% nebulizer solution TAKE 3 ML'S BY NEBULIZATION EVERY 6 HOURS AS NEEDED FOR SHORTNESS OF BREATH AND AS NEEDED 02/08/16   Rigoberto Noel, MD  ALPRAZolam Duanne Moron) 0.25 MG tablet Take 1/2 - 1 tablet by mouth daily as needed 11/08/15   Tammy S Parrett, NP  budesonide (PULMICORT) 0.25 MG/2ML nebulizer solution Take 2 mLs (0.25 mg total) by nebulization 2 (two) times daily. DX: J43.9 Patient not taking: Reported on 07/03/2016 06/20/16   Fonnie Mu Parrett, NP  cholecalciferol (VITAMIN D) 1000 UNITS tablet Take 1,000 Units by mouth daily.    Historical Provider, MD  feeding supplement (BOOST / RESOURCE BREEZE) LIQD Take 1 Container by mouth 3 (three) times daily between meals. 03/21/16   Lavina Hamman, MD  Ferrous Sulfate (IRON SLOW RELEASE) 143 (45 Fe) MG TBCR  Take by mouth daily.    Historical Provider, MD  fluticasone furoate-vilanterol (BREO ELLIPTA) 100-25 MCG/INH AEPB Inhale 1 puff into the lungs daily. 07/03/16 08/02/16  Rigoberto Noel, MD  levofloxacin (LEVAQUIN) 500 MG tablet Take 1 tablet (500 mg total) by mouth daily. 07/19/16   Davonna Belling, MD  Multiple Vitamins-Minerals (MULTIVITAMIN & MINERAL PO) Take 1 tablet by mouth daily.    Historical Provider, MD  OXYGEN Inhale 3 L/hr into the lungs.    Historical Provider, MD  potassium chloride SA (K-DUR,KLOR-CON) 20 MEQ tablet Take 1 tablet (20 mEq total) by mouth daily. 04/18/16   Gay Filler Copland, MD  predniSONE (DELTASONE) 20 MG tablet Take 2 tablets (40 mg total) by mouth daily. 07/19/16   Davonna Belling, MD  ranitidine (ZANTAC) 300 MG tablet Take 1 tablet (300 mg total)  by mouth at bedtime. 1 tablet at bedtime 04/07/16 04/07/17  Gay Filler Copland, MD  sodium chloride (OCEAN) 0.65 % SOLN nasal spray Place 2 sprays into both nostrils as needed for congestion. Reported on 12/31/2015    Historical Provider, MD  solifenacin (VESICARE) 5 MG tablet Take 5 mg by mouth daily. Pt does not take daily (takes PRN)    Historical Provider, MD  verapamil (CALAN-SR) 240 MG CR tablet Take 2 tablets (480 mg total) by mouth daily. Patient taking differently: Take 480 mg by mouth daily. Alternate between [1] daily and [2] daily 01/09/16   Darreld Mclean, MD       Physical Exam: BP 135/64 (BP Location: Left Arm)   Pulse 72   Temp 98 F (36.7 C) (Oral)   Resp 20   Ht 5\' 5"  (1.651 m)   Wt 47.6 kg (105 lb)   SpO2 100%   BMI 17.47 kg/m  General appearance: Thin frail elderly female, alert and in no acute distress.   Eyes: Anicteric, conjunctiva pink, lids and lashes normal. PERRL.    ENT: No nasal deformity, discharge, epistaxis.  Hearing normal. OP moist without lesions.   Neck: No neck masses.  Trachea midline.  No thyromegaly/tenderness. Lymph: No cervical or supraclavicular lymphadenopathy. Skin:  Warm and dry.  No jaundice.  No suspicious rashes or lesions. Cardiac: RRR, nl S1-S2, no murmurs appreciated.  Capillary refill is brisk.  JVP normal.  No LE edema.  Radial and DP pulses 2+ and symmetric. Respiratory: Normal respiratory rate but tracheal tugging.  Breath sounds are tight.  There are few expiratory wheezes bilaterally.  No rales. Abdomen: Abdomen soft.  No TTP. No ascites, distension, hepatosplenomegaly.   MSK: No deformities or effusions.  No cyanosis.  Clubbing noted. Neuro: Cranial nerves normal.  Sensation intact to light touch. Speech is fluent.  Muscle strength normal.    Psych: Sensorium intact and responding to questions, attention normal.  Behavior appropriate.  Affect normal.  Judgment and insight appear normal.     Labs on Admission:  I have personally reviewed following labs and imaging studies: CBC:  Recent Labs Lab 07/19/16 1900 07/21/16 1145  WBC 10.9* 9.0  NEUTROABS 9.1* 8.2*  HGB 10.0* 9.6*  HCT 35.4* 33.2*  MCV 80.5 79.0  PLT 228 [id number]   Basic Metabolic Panel:  Recent Labs Lab 07/19/16 1816 07/21/16 1220  NA 144 143  K 3.7 3.7  CL 96* 98*  CO2 41* 40*  GLUCOSE 112* 116*  BUN 11 11  CREATININE 0.39* 0.44  CALCIUM 9.8 9.8   GFR: Estimated Creatinine Clearance: 43.6 mL/min (by C-G formula based on SCr of 0.44 mg/dL).  Liver Function Tests:  Recent Labs Lab 07/21/16 1220  AST 48*  ALT 22  ALKPHOS 59  BILITOT 0.4  PROT 6.9  ALBUMIN 3.8   No results for input(s): LIPASE, AMYLASE in the last 168 hours. No results for input(s): AMMONIA in the last 168 hours. Coagulation Profile:  Recent Labs Lab 07/21/16 1220  INR 1.13   Cardiac Enzymes:  Recent Labs Lab 07/21/16 1145  TROPONINI <0.03   BNP (last 3 results) No results for input(s): PROBNP in the last 8760 hours. HbA1C: No results for input(s): HGBA1C in the last 72 hours. CBG: No results for input(s): GLUCAP in the last 168 hours. Lipid Profile: No results for  input(s): CHOL, HDL, LDLCALC, TRIG, CHOLHDL, LDLDIRECT in the last 72 hours. Thyroid Function Tests: No results for input(s): TSH, T4TOTAL, FREET4, T3FREE, THYROIDAB  in the last 72 hours. Anemia Panel: No results for input(s): VITAMINB12, FOLATE, FERRITIN, TIBC, IRON, RETICCTPCT in the last 72 hours. Sepsis Labs: Invalid input(s): PROCALCITONIN, LACTICIDVEN No results found for this or any previous visit (from the past 240 hour(s)).       Radiological Exams on Admission: Personally reviewed CXR shows emphysematous changes, hyperexpansion, opacity noted two days ago is resolving, no edema: Dg Chest 2 View  Result Date: 07/21/2016 CLINICAL DATA:  Shortness of breath. EXAM: CHEST  2 VIEW COMPARISON:  07/19/2016 and CT chest 08/30/2015. FINDINGS: Trachea is midline. Heart size normal. Lungs are hyperinflated with evidence of bullous emphysema. No airspace consolidation or pleural fluid. IMPRESSION: 1. No acute findings. 2.  Emphysema (ICD10-J43.9). Electronically Signed   By: Lorin Picket M.D.   On: 07/21/2016 12:02    EKG: Independently reviewed. Rate 71, QTc 422, no ST changes.    Assessment/Plan  1. Acute hypoxic respiratory failure with hypercapnia:  VBG shows no acidosis but presumably compensated hypercarbia.  To some extent, her hypoxia on arrival is probably from her portable O2 not triggering, as she was not hypoxic 2 days ago. -COPD and CAP treatment as below   2. COPD with exacerbation:  On Breo at home, plus home O2.  Previously on daily prednisone but did not tolerate.   -Continue Breo -Prednisone 40 mg daily -Albuterol scheduled q6hrs -Albuterol PRN  3. Pneumonia:  Diagnosed in ER the other night based on patchy left opacity.  Patient completed one dose of antibiotics in the ER and one dose of Levaquin at home Sunday.  Got azithromycin IV here tonight. -Continue azithromycin daily for 4 more days -Check procalcitonin -Check legionella and strep pneumo  antigens  4. Malnutrition of moderate degree:  -Continue home nutrition supplement  5. Anemia:  Microcytic.  Iron stores normal earlier this year.   -Continue iron supplement  6. Anxiety:  -Continue alprazolam PRN  7. History of DVT:  Suspicion for PE low. No longer on anticoagulation.  8. Other medications: -Continue Vesicare -Continue Ranitidine -Continue potassium supplement  9. HTN:  -Continue verapamil 240-480 daily      DVT prophylaxis: Lovenox  Code Status: FULL  Family Communication: None present  Disposition Plan: Anticipate scheduled albuterol ovenright and re-evaluate tomorrow, is breathing improved, and O2 saturation stable on home O2 level, likely home tomorrow to finish azithromycin and prednisone perhaps prolonged taper  Consults called: None Admission status: OBS, med surg At the point of initial evaluation, it is my clinical opinion that admission for OBSERVATION is reasonable and necessary because the patient's presenting complaints in the context of their chronic conditions represent sufficient risk of deterioration or significant morbidity to constitute reasonable grounds for close observation in the hospital setting, but that the patient may be medically stable for discharge from the hospital within 24 to 48 hours.    Medical decision making: Patient seen at 7:40 PM on 07/21/2016.   What exists of the patient's chart was reviewed in depth and summarized above.  Clinical condition: stable.        Edwin Dada [facility] Pager [phone]

## 2016-07-21 NOTE — ED Provider Notes (Signed)
540 pm pt resting comfortably, states "I'm doing good"   Orlie Dakin, MD 07/21/16 1743

## 2016-07-21 NOTE — ED Provider Notes (Signed)
[facility] DEPT [facility] Provider Note   CSN: [id number] Arrival date & time: 07/21/16  1044     History   Chief Complaint Chief Complaint  Patient presents with  . Shortness of Breath    HPI Holly Page is a 80 y.o. female.  HPI   Pt seen here two days ago with SOB, diagnosed with pna and started on pred and levaquin. She thinks she is having side effects from the levaquin- fioot pain, hand swelling, headaches etc. She repoerts using home O2 and increasing it but still feelig SOB.    No fevers, mild muscle aches and fatigue and increasing SOB.   Past Medical History:  Diagnosis Date  . COPD (chronic obstructive pulmonary disease) ([place])   . DVT (deep venous thrombosis) ([place]) 1980s, recurrent 2015   Right DVT 2015  on xarelto  . GERD (gastroesophageal reflux disease)   . Hypertension   . Lactose intolerance   . Meningioma ([place])    followed by Dr Christella Noa  . Pneumonia   . Spinal stenosis     Patient Active Problem List   Diagnosis Date Noted  . Chronic respiratory failure ([place]) 04/10/2016  . COPD with exacerbation ([place]) 03/19/2016  . RBC microcytosis 03/19/2016  . Oxygen dependent 12/31/2015  . Anxiety 11/08/2015  . Hyperthyroidism 09/07/2015  . Malnutrition of moderate degree 08/31/2015  . Abnormal LFTs 03/19/2015  . Back pain 03/19/2015  . HTN (hypertension) 03/01/2015  . Anemia 12/19/2014  . Calculus of kidney 08/30/2014  . Hematuria 08/11/2014  . History of DVT (deep vein thrombosis)   . Neuralgia neuritis, sciatic nerve 05/22/2014  . 1st degree AV block 04/24/2014  . History of colon polyps 04/24/2014  . Adaptive colitis 04/24/2014  . APC (atrial premature contractions) 04/24/2014  . Benign neoplasm of meninges ([place]) 02/10/2014  . Barrett esophagus 02/03/2014  . Spinal stenosis 12/15/2013  . Allergic rhinitis 11/21/2013  . COPD with emphysema Gold D   . GERD (gastroesophageal reflux disease)   . Lactose intolerance   . DDD (degenerative disc  disease), lumbosacral 06/11/2010  . Diffuse cerebrovascular disease 06/11/2010    Past Surgical History:  Procedure Laterality Date  . CHOLECYSTECTOMY    . FOOT SURGERY    . TONSILLECTOMY    . TUBAL LIGATION      OB History    No data available       Home Medications    Prior to Admission medications   Medication Sig Start Date End Date Taking? Authorizing Provider  acetaminophen-codeine (TYLENOL #3) 300-30 MG tablet Take 1-2 tablets by mouth every 8 (eight) hours as needed for moderate pain or severe pain. 05/12/16   Darreld Mclean, MD  albuterol (PROVENTIL HFA;VENTOLIN HFA) 108 (90 Base) MCG/ACT inhaler Inhale 2 puffs into the lungs every 6 (six) hours as needed for wheezing or shortness of breath. 02/13/16   Tammy S Parrett, NP  albuterol (PROVENTIL) (2.5 MG/3ML) 0.083% nebulizer solution TAKE 3 ML'S BY NEBULIZATION EVERY 6 HOURS AS NEEDED FOR SHORTNESS OF BREATH AND AS NEEDED 02/08/16   Rigoberto Noel, MD  ALPRAZolam Duanne Moron) 0.25 MG tablet Take 1/2 - 1 tablet by mouth daily as needed 11/08/15   Tammy S Parrett, NP  budesonide (PULMICORT) 0.25 MG/2ML nebulizer solution Take 2 mLs (0.25 mg total) by nebulization 2 (two) times daily. DX: J43.9 Patient not taking: Reported on 07/03/2016 06/20/16   Fonnie Mu Parrett, NP  cholecalciferol (VITAMIN D) 1000 UNITS tablet Take 1,000 Units by mouth daily.  Historical Provider, MD  feeding supplement (BOOST / RESOURCE BREEZE) LIQD Take 1 Container by mouth 3 (three) times daily between meals. 03/21/16   Lavina Hamman, MD  Ferrous Sulfate (IRON SLOW RELEASE) 143 (45 Fe) MG TBCR Take by mouth daily.    Historical Provider, MD  fluticasone furoate-vilanterol (BREO ELLIPTA) 100-25 MCG/INH AEPB Inhale 1 puff into the lungs daily. 07/03/16 08/02/16  Rigoberto Noel, MD  levofloxacin (LEVAQUIN) 500 MG tablet Take 1 tablet (500 mg total) by mouth daily. 07/19/16   Davonna Belling, MD  Multiple Vitamins-Minerals (MULTIVITAMIN & MINERAL PO) Take 1 tablet by  mouth daily.    Historical Provider, MD  OXYGEN Inhale 3 L/hr into the lungs.    Historical Provider, MD  potassium chloride SA (K-DUR,KLOR-CON) 20 MEQ tablet Take 1 tablet (20 mEq total) by mouth daily. 04/18/16   Gay Filler Copland, MD  predniSONE (DELTASONE) 20 MG tablet Take 2 tablets (40 mg total) by mouth daily. 07/19/16   Davonna Belling, MD  ranitidine (ZANTAC) 300 MG tablet Take 1 tablet (300 mg total) by mouth at bedtime. 1 tablet at bedtime 04/07/16 04/07/17  Gay Filler Copland, MD  sodium chloride (OCEAN) 0.65 % SOLN nasal spray Place 2 sprays into both nostrils as needed for congestion. Reported on 12/31/2015    Historical Provider, MD  solifenacin (VESICARE) 5 MG tablet Take 5 mg by mouth daily. Pt does not take daily (takes PRN)    Historical Provider, MD  verapamil (CALAN-SR) 240 MG CR tablet Take 2 tablets (480 mg total) by mouth daily. Patient taking differently: Take 480 mg by mouth daily. Alternate between [1] daily and [2] daily 01/09/16   Darreld Mclean, MD    Family History Family History  Problem Relation Age of Onset  . COPD Father     smoker deceased.   . Lupus Sister   . Heart attack Son     Social History Social History  Substance Use Topics  . Smoking status: Former Smoker    Packs/day: 1.50    Years: 56.00    Types: Cigarettes    Quit date: 04/29/2012  . Smokeless tobacco: Never Used     Comment: quit smoking 3 years ago  . Alcohol use No     Comment: quit drinking beer 2005     Allergies   Incruse ellipta [umeclidinium bromide]; Tramadol; Amlodipine; Aspirin; Codeine; Doxycycline; Hydrocodone; Losartan; Propoxyphene; Gabapentin; and Tiotropium   Review of Systems Review of Systems  Constitutional: Positive for fatigue. Negative for fever.  Respiratory: Positive for cough, chest tightness and shortness of breath.   Cardiovascular: Positive for chest pain.  Gastrointestinal: Negative for abdominal pain.  Neurological: Positive for light-headedness.  Negative for dizziness.  All other systems reviewed and are negative.    Physical Exam Updated Vital Signs BP 130/68   Pulse 68   Temp 98.6 F (37 C) (Oral)   Resp 18   Ht 5\' 5"  (1.651 m)   Wt 101 lb (45.8 kg)   SpO2 100%   BMI 16.81 kg/m   Physical Exam  Constitutional: She is oriented to person, place, and time. She appears well-developed and well-nourished.  HENT:  Head: Normocephalic and atraumatic.  Eyes: EOM are normal. Pupils are equal, round, and reactive to light. Right eye exhibits no discharge.  Neck: Normal range of motion. Neck supple.  Cardiovascular: Normal rate, regular rhythm and normal heart sounds.   Pulmonary/Chest: She has no wheezes. She exhibits no tenderness.  Tachypnea, pursed lip breathibng  Abdominal: There is no tenderness.  Musculoskeletal: Normal range of motion. She exhibits no edema.  Neurological: She is oriented to person, place, and time.  Skin: Skin is warm and dry. She is not diaphoretic.  Psychiatric: She has a normal mood and affect.  Nursing note and vitals reviewed.    ED Treatments / Results  Labs (all labs ordered are listed, but only abnormal results are displayed) Labs Reviewed  CBC WITH DIFFERENTIAL/PLATELET - Abnormal; Notable for the following:       Result Value   Hemoglobin 9.6 (*)    HCT 33.2 (*)    MCH 22.9 (*)    MCHC 28.9 (*)    RDW 15.7 (*)    Neutro Abs 8.2 (*)    Lymphs Abs 0.6 (*)    All other components within normal limits  I-STAT VENOUS BLOOD GAS, ED - Abnormal; Notable for the following:    pCO2, Ven 83.4 (*)    pO2, Ven 22.0 (*)    Bicarbonate 49.7 (*)    Acid-Base Excess 21.0 (*)    All other components within normal limits  TROPONIN I  PROTIME-INR  COMPREHENSIVE METABOLIC PANEL    EKG  EKG Interpretation  Date/Time:  Monday July 21 2016 11:09:40 EDT Ventricular Rate:  71 PR Interval:    QRS Duration: 88 QT Interval:  388 QTC Calculation: 422 R Axis:   88 Text Interpretation:   Sinus rhythm Borderline right axis deviation Lead(s) I were not used for morphology analysis no acute ischemia Confirmed by Gerald Leitz ([id number]) on 07/21/2016 11:16:40 AM Also confirmed by Gerald Leitz ([id number]), editor [place], Joelene Millin [phone])  on 07/21/2016 12:09:04 PM       Radiology Dg Chest 2 View  Result Date: 07/21/2016 CLINICAL DATA:  Shortness of breath. EXAM: CHEST  2 VIEW COMPARISON:  07/19/2016 and CT chest 08/30/2015. FINDINGS: Trachea is midline. Heart size normal. Lungs are hyperinflated with evidence of bullous emphysema. No airspace consolidation or pleural fluid. IMPRESSION: 1. No acute findings. 2.  Emphysema (ICD10-J43.9). Electronically Signed   By: Lorin Picket M.D.   On: 07/21/2016 12:02   Dg Chest 2 View  Result Date: 07/19/2016 CLINICAL DATA:  Left-sided chest pain 5 years worse over the past week under left breast. Productive cough which shortness-of-breath and congestion. Oxygen dependent. EXAM: CHEST  2 VIEW COMPARISON:  03/26/2016 FINDINGS: Lungs are hyperinflated with flattening of the hemidiaphragms. Mild left basilar interstitial prominence unchanged. Possible patchy opacification in the posterior left base which may be due to atelectasis or infection. Cardiomediastinal silhouette within normal. Calcified plaque over the aortic arch. Remainder of the exam unchanged. IMPRESSION: Mild patchy density in the left base which may be due to atelectasis or infection. Emphysematous disease with subtle chronic bibasilar interstitial prominence. Aortic atherosclerosis Electronically Signed   By: Marin Olp M.D.   On: 07/19/2016 19:10    Procedures Procedures (including critical care time)  Medications Ordered in ED Medications  predniSONE (DELTASONE) tablet 60 mg (not administered)     Initial Impression / Assessment and Plan / ED Course  I have reviewed the triage vital signs and the nursing notes.  Pertinent labs & imaging results that were available  during my care of the patient were reviewed by me and considered in my medical decision making (see chart for details).  Clinical Course    Pt is a 80 yo with COPD, ho DVT here with increasing SOB.  Pna seen on xray two days ago.  Pt reports she  took abx X 2 but thinks she is having side effects and does not want them any more.  She also reports worsening SOB.  Only patients home O2 at 4 L (twice herusual) she is 77%.  On our O2 she is 98%.  Patient;s home O2 is valve only, previosuly she did not qualify for continueous flw with a concentrator. Concerned that this pna has significantly affected her O2 need. Will repeat CXR, admit for increased O2 need.   12:30 PM Xray shows no acute changes from previous- on previious LLL vs ateletctasis called. It is unchanged. Pt symtpoms more consistent with COPD exacerbation. Will treat with increased prednsione and azithro (for its anti inflammatory properties) especially since patient refusing to continue levaquin.   Will admit for hypoxia.   Final Clinical Impressions(s) / ED Diagnoses   Final diagnoses:  None    New Prescriptions New Prescriptions   No medications on file     Jaiyla Granados Julio Alm, MD 07/21/16 1230

## 2016-07-21 NOTE — Telephone Encounter (Signed)
This called this patient and check whether she is doing better

## 2016-07-21 NOTE — Progress Notes (Signed)
CRITICAL VALUE ALERT  Critical value received:  Troponin level 0.13  Date of notification:  07/21/2016  Time of notification:  2155  Critical value read back:Yes.    Nurse who received alert:  Tonette Lederer RN  MD notified (1st page):  NP Baltazar Najjar  Time of first page:  2157  MD notified (2nd page): NP Baltazar Najjar   Time of second page: 0027  Responding MD:  NP Baltazar Najjar   Time MD responded:  0036  NP Baltazar Najjar  placed an order for repeat Troponin level

## 2016-07-21 NOTE — ED Notes (Signed)
Called to front for shortness of breath, quick pulse ox check on home demand flow [place] SpO2 77% with good wave-form.  Patient RR 28-30, HR 76-80, shallow respirations not triggering flow with each breath.  Once to room 4 place on 4 l/m continuous flow SpO2 increased to 100% quickly.  Weaned O2 to 2l/m SpO2 remain >95%.

## 2016-07-21 NOTE — ED Triage Notes (Signed)
SOB. States this am she saw a small amount of blood in her BM. Abdominal pain. She was seen 2 days ago for SOB. She is on home oxygen at 2l/m [place].

## 2016-07-21 NOTE — Progress Notes (Signed)
    Patient coming from [place] for acute hypercarbic respiratory failure secondary to inadequately treated pneumonia and COPD exacerbation. Of note patient with only intermittent home O2 administration and likely to need a change in her regimen prior to discharge. Currently patient's VBG showing pH of 7.38, PCO2 83.4, PCO2 22, bicarbonate greater than 50. Patient currently oxygenating at 100% with increased O2 delivery and no longer in significant respiratory distress. Patient accepted to medical surgical bed under observation status.   Linna Darner, MD [facility] 07/21/2016, 2:55 PM

## 2016-07-22 DIAGNOSIS — J441 Chronic obstructive pulmonary disease with (acute) exacerbation: Secondary | ICD-10-CM | POA: Diagnosis not present

## 2016-07-22 LAB — CBC
HCT: 32.4 % — ABNORMAL LOW (ref 36.0–46.0)
Hemoglobin: 9.2 g/dL — ABNORMAL LOW (ref 12.0–15.0)
MCH: 22.2 pg — ABNORMAL LOW (ref 26.0–34.0)
MCHC: 28.4 g/dL — ABNORMAL LOW (ref 30.0–36.0)
MCV: 78.1 fL (ref 78.0–100.0)
PLATELETS: 286 10*3/uL (ref 150–400)
RBC: 4.15 MIL/uL (ref 3.87–5.11)
RDW: 15.2 % (ref 11.5–15.5)
WBC: 6.3 10*3/uL (ref 4.0–10.5)

## 2016-07-22 LAB — BASIC METABOLIC PANEL
ANION GAP: 7 (ref 5–15)
BUN: 12 mg/dL (ref 6–20)
CALCIUM: 9.4 mg/dL (ref 8.9–10.3)
CO2: 38 mmol/L — ABNORMAL HIGH (ref 22–32)
Chloride: 99 mmol/L — ABNORMAL LOW (ref 101–111)
Creatinine, Ser: 0.45 mg/dL (ref 0.44–1.00)
GLUCOSE: 94 mg/dL (ref 65–99)
POTASSIUM: 4.1 mmol/L (ref 3.5–5.1)
Sodium: 144 mmol/L (ref 135–145)

## 2016-07-22 LAB — TROPONIN I

## 2016-07-22 LAB — STREP PNEUMONIAE URINARY ANTIGEN: STREP PNEUMO URINARY ANTIGEN: NEGATIVE

## 2016-07-22 MED ORDER — METHYLPREDNISOLONE SODIUM SUCC 40 MG IJ SOLR
40.0000 mg | Freq: Two times a day (BID) | INTRAMUSCULAR | Status: DC
  Administered 2016-07-22 – 2016-07-24 (×4): 40 mg via INTRAVENOUS
  Filled 2016-07-22 (×4): qty 1

## 2016-07-22 MED ORDER — ENSURE ENLIVE PO LIQD
237.0000 mL | Freq: Three times a day (TID) | ORAL | Status: DC
  Administered 2016-07-22 – 2016-07-23 (×5): 237 mL via ORAL

## 2016-07-22 NOTE — Progress Notes (Addendum)
PROGRESS NOTE    Holly Page  [id number] DOB: [birth year] DOA: 07/21/2016 PCP: No primary care provider on file. Brief Narrative: Holly Page is a 80 y.o. female with a past medical history significant for COPD GOLD D, FEV1 30% on 3L home O2, HTN and recurrent DVT no longer on [facility] who presented with worsening shortness of breath for 1-70months. She is followed by Pulm very closely, and saw Dr.Alva 10/5 and was not felt to be having a flare. Before that 9/14 saw Holly Parrett NP 10/21 seen in ED and given levaquin and Prendisone taper 10/23 called [facility] Pulm office and then came to ER. CXR without acute findings  Assessment & Plan:  1. COPD exacerbation -rare wheezes and poor air movement -IV steroids today, nebs, Abx -CXR without acute findings -Addendum: Recall significant symptom burden and hence used IV steroids inpatient  2. Progressive dyspnea -for 81months -check 2d ECHO -eval for severe pulm HTN -would benefit from Pulm rehab too -Pulm may need to bring up hospice at follow uo  3. Anemia:  Microcytic.  Iron stores normal earlier this year.   -Continue iron supplement  4. Severe protein calorie malnutrition  5. . Anxiety:  -Continue alprazolam PRN  6. History of DVT:  Suspicion for PE low. No longer on anticoagulation.  7. HTN:  -Continue verapamil   DVT prophylaxis: Lovenox  Code Status: FULL  Family Communication: None present  Disposition Plan: home in 1-2days    Subjective: Progressive dyspnea over 1-38months, losing weight  Objective: Vitals:   07/21/16 2055 07/21/16 2131 07/22/16 0201 07/22/16 0519  BP:  140/66  128/71  Pulse: 73 72  71  Resp: 20 18  18   Temp:  98.4 F (36.9 C)  97.3 F (36.3 C)  TempSrc:    Oral  SpO2: 99% 100% 99% 100%  Weight:      Height:        Intake/Output Summary (Last 24 hours) at 07/22/16 1200 Last data filed at 07/22/16 [id number]  Gross per 24 hour  Intake              400 ml  Output               150 ml  Net              250 ml   Filed Weights   07/21/16 1050 07/21/16 1854  Weight: 45.8 kg (101 lb) 47.6 kg (105 lb)    Examination:  General exam: frail, cachectic, no distress Respiratory system: poor air movement, rare wheezes Cardiovascular system: S1 & S2 heard, RRR. No JVD, murmurs, rubs, gallops or clicks. No pedal edema. Gastrointestinal system: Abdomen is nondistended, soft and nontender. No organomegaly or masses felt. Normal bowel sounds heard. Central nervous system: Alert and oriented. No focal neurological deficits. Extremities: Symmetric 5 x 5 power. Skin: No rashes, lesions or ulcers Psychiatry: anxious    Data Reviewed: I have personally reviewed following labs and imaging studies  CBC:  Recent Labs Lab 07/19/16 1900 07/21/16 1145 07/22/16 0305  WBC 10.9* 9.0 6.3  NEUTROABS 9.1* 8.2*  --   HGB 10.0* 9.6* 9.2*  HCT 35.4* 33.2* 32.4*  MCV 80.5 79.0 78.1  PLT 228 303 [id number]   Basic Metabolic Panel:  Recent Labs Lab 07/19/16 1816 07/21/16 1220 07/22/16 0305  NA 144 143 144  K 3.7 3.7 4.1  CL 96* 98* 99*  CO2 41* 40* 38*  GLUCOSE 112* 116* 94  BUN 11 11 12  CREATININE 0.39* 0.44 0.45  CALCIUM 9.8 9.8 9.4   GFR: Estimated Creatinine Clearance: 43.6 mL/min (by C-G formula based on SCr of 0.45 mg/dL). Liver Function Tests:  Recent Labs Lab 07/21/16 1220  AST 48*  ALT 22  ALKPHOS 59  BILITOT 0.4  PROT 6.9  ALBUMIN 3.8   No results for input(s): LIPASE, AMYLASE in the last 168 hours. No results for input(s): AMMONIA in the last 168 hours. Coagulation Profile:  Recent Labs Lab 07/21/16 1220  INR 1.13   Cardiac Enzymes:  Recent Labs Lab 07/21/16 1145 07/21/16 1946 07/22/16 0305  TROPONINI <0.03 0.13* <0.03   BNP (last 3 results) No results for input(s): PROBNP in the last 8760 hours. HbA1C: No results for input(s): HGBA1C in the last 72 hours. CBG: No results for input(s): GLUCAP in the last 168 hours. Lipid  Profile: No results for input(s): CHOL, HDL, LDLCALC, TRIG, CHOLHDL, LDLDIRECT in the last 72 hours. Thyroid Function Tests: No results for input(s): TSH, T4TOTAL, FREET4, T3FREE, THYROIDAB in the last 72 hours. Anemia Panel: No results for input(s): VITAMINB12, FOLATE, FERRITIN, TIBC, IRON, RETICCTPCT in the last 72 hours. Urine analysis:    Component Value Date/Time   COLORURINE YELLOW 08/30/2015 2317   APPEARANCEUR CLEAR 08/30/2015 2317   LABSPEC 1.025 08/30/2015 2317   PHURINE 7.5 08/30/2015 2317   GLUCOSEU NEGATIVE 08/30/2015 2317   HGBUR SMALL (A) 08/30/2015 2317   BILIRUBINUR NEGATIVE 08/30/2015 2317   BILIRUBINUR negative 06/29/2015 1440   KETONESUR 15 (A) 08/30/2015 2317   PROTEINUR NEGATIVE 08/30/2015 2317   UROBILINOGEN negative 06/29/2015 1440   UROBILINOGEN 0.2 08/13/2014 2003   NITRITE NEGATIVE 08/30/2015 2317   LEUKOCYTESUR NEGATIVE 08/30/2015 2317   Sepsis Labs: @LABRCNTIP (procalcitonin:4,lacticidven:4)  )No results found for this or any previous visit (from the past 240 hour(s)).       Radiology Studies: Dg Chest 2 View  Result Date: 07/21/2016 CLINICAL DATA:  Shortness of breath. EXAM: CHEST  2 VIEW COMPARISON:  07/19/2016 and CT chest 08/30/2015. FINDINGS: Trachea is midline. Heart size normal. Lungs are hyperinflated with evidence of bullous emphysema. No airspace consolidation or pleural fluid. IMPRESSION: 1. No acute findings. 2.  Emphysema (ICD10-J43.9). Electronically Signed   By: Lorin Picket M.D.   On: 07/21/2016 12:02        Scheduled Meds: . albuterol  2.5 mg Nebulization Q6H  . azithromycin  250 mg Oral Daily  . enoxaparin (LOVENOX) injection  40 mg Subcutaneous Q24H  . famotidine  40 mg Oral QHS  . feeding supplement (ENSURE ENLIVE)  237 mL Oral TID BM  . ferrous sulfate   Oral Daily  . fluticasone furoate-vilanterol  1 puff Inhalation Daily  . methylPREDNISolone (SOLU-MEDROL) injection  40 mg Intravenous Q12H  . potassium chloride  SA  20 mEq Oral Daily  . verapamil  240 mg Oral Q48H  . [START ON 07/23/2016] verapamil  480 mg Oral Q48H   Continuous Infusions:    LOS: 0 days    Time spent: 50min    Domenic Polite, MD [facility] Pager [phone]  If 7PM-7AM, please contact night-coverage [email or web address] Password [email or web address] 07/22/2016, 12:00 PM

## 2016-07-22 NOTE — Progress Notes (Signed)
Initial Nutrition Assessment  DOCUMENTATION CODES:   Severe malnutrition in context of chronic illness, Underweight  INTERVENTION:   -Ensure Enlive po TID, each supplement provides 350 kcal and 20 grams of protein  NUTRITION DIAGNOSIS:   Malnutrition related to chronic illness as evidenced by moderate depletions of muscle mass, severe depletion of muscle mass, moderate depletion of body fat, severe depletion of body fat.  GOAL:   Patient will meet greater than or equal to 90% of their needs  MONITOR:   PO intake, Supplement acceptance, Labs, Weight trends, Skin, I & O's  REASON FOR ASSESSMENT:   Malnutrition Screening Tool, Consult Assessment of nutrition requirement/status  ASSESSMENT:   Holly Page is a 80 y.o. female with a past medical history significant for COPD GOLD D, FEV1 30% on 3L home O2, HTN and recurrent DVT no longer on [facility] who presents with shortness of breath with wheezing and cough for 4 weeks, now worsening.  Pt admitted with acute respiratory failure.   Spoke with pt at bedside. She reports inconsistent appetite PTA; she shares "there are some days I eat good and other days when I eat nothing at all". Pt reports she consumed about 50% of breakfast.   Pt reports a general decline health over the past year, related to multiple hospitalizations and respiratory status. Pt shares that she has become progressively weaker and her son now regularly helps with cooking. Pt shares that she prefers softer texture foods for energy conservation. Additionally, pt reports that she has been consuming nutritional supplements daily intermittently, however, is unable to be consistent due to finances.   Pt reports she has always been petite and UBW is around 120#. Reviewed wt hx; pt has experienced a 7% wt loss over the past year.   Nutrition-Focused physical exam completed. Findings are moderate to severe fat depletion, moderate to severe muscle depletion, and no edema.    Discussed importance of good meal and supplement intake to promote healing. Pt amenable to Ensure.   Labs reviewed.   Diet Order:  Diet regular Room service appropriate? Yes; Fluid consistency: Thin  Skin:  Reviewed, no issues  Last BM:  07/21/16  Height:   Ht Readings from Last 1 Encounters:  07/21/16 5\' 5"  (1.651 m)    Weight:   Wt Readings from Last 1 Encounters:  07/21/16 105 lb (47.6 kg)    Ideal Body Weight:  56.8 kg  BMI:  Body mass index is 17.47 kg/m.  Estimated Nutritional Needs:   Kcal:  1500-1700  Protein:  70-85 grams  Fluid:  1.5-1.7 L  EDUCATION NEEDS:   Education needs addressed  Mckinzie Saksa A. Jimmye Norman, RD, LDN, CDE Pager: [phone] After hours Pager: [phone]

## 2016-07-22 NOTE — Care Management Obs Status (Signed)
[facility] NOTIFICATION   Patient Details  Name: Holly Page MRN: [id number] Date of Birth: September 08, 1936   Medicare Observation Status Notification Given:  Yes (letter explained and answered patient's questions)    Apolonio Schneiders, RN 07/22/2016, 7:03 PM

## 2016-07-23 ENCOUNTER — Observation Stay (HOSPITAL_COMMUNITY): Payer: Medicare Other

## 2016-07-23 DIAGNOSIS — Z86011 Personal history of benign neoplasm of the brain: Secondary | ICD-10-CM | POA: Diagnosis not present

## 2016-07-23 DIAGNOSIS — R0600 Dyspnea, unspecified: Secondary | ICD-10-CM | POA: Diagnosis not present

## 2016-07-23 DIAGNOSIS — J441 Chronic obstructive pulmonary disease with (acute) exacerbation: Principal | ICD-10-CM

## 2016-07-23 DIAGNOSIS — Z87891 Personal history of nicotine dependence: Secondary | ICD-10-CM | POA: Diagnosis not present

## 2016-07-23 DIAGNOSIS — R718 Other abnormality of red blood cells: Secondary | ICD-10-CM | POA: Diagnosis present

## 2016-07-23 DIAGNOSIS — E739 Lactose intolerance, unspecified: Secondary | ICD-10-CM | POA: Diagnosis present

## 2016-07-23 DIAGNOSIS — R0602 Shortness of breath: Secondary | ICD-10-CM | POA: Diagnosis present

## 2016-07-23 DIAGNOSIS — Z825 Family history of asthma and other chronic lower respiratory diseases: Secondary | ICD-10-CM | POA: Diagnosis not present

## 2016-07-23 DIAGNOSIS — Z681 Body mass index (BMI) 19 or less, adult: Secondary | ICD-10-CM | POA: Diagnosis not present

## 2016-07-23 DIAGNOSIS — E43 Unspecified severe protein-calorie malnutrition: Secondary | ICD-10-CM | POA: Diagnosis present

## 2016-07-23 DIAGNOSIS — K219 Gastro-esophageal reflux disease without esophagitis: Secondary | ICD-10-CM | POA: Diagnosis present

## 2016-07-23 DIAGNOSIS — J9621 Acute and chronic respiratory failure with hypoxia: Secondary | ICD-10-CM | POA: Diagnosis present

## 2016-07-23 DIAGNOSIS — Z886 Allergy status to analgesic agent status: Secondary | ICD-10-CM | POA: Diagnosis not present

## 2016-07-23 DIAGNOSIS — Z9981 Dependence on supplemental oxygen: Secondary | ICD-10-CM | POA: Diagnosis not present

## 2016-07-23 DIAGNOSIS — Z86718 Personal history of other venous thrombosis and embolism: Secondary | ICD-10-CM | POA: Diagnosis not present

## 2016-07-23 DIAGNOSIS — F419 Anxiety disorder, unspecified: Secondary | ICD-10-CM | POA: Diagnosis present

## 2016-07-23 DIAGNOSIS — D509 Iron deficiency anemia, unspecified: Secondary | ICD-10-CM | POA: Diagnosis present

## 2016-07-23 DIAGNOSIS — J189 Pneumonia, unspecified organism: Secondary | ICD-10-CM | POA: Diagnosis present

## 2016-07-23 DIAGNOSIS — J440 Chronic obstructive pulmonary disease with acute lower respiratory infection: Secondary | ICD-10-CM | POA: Diagnosis present

## 2016-07-23 DIAGNOSIS — Z8249 Family history of ischemic heart disease and other diseases of the circulatory system: Secondary | ICD-10-CM | POA: Diagnosis not present

## 2016-07-23 DIAGNOSIS — I253 Aneurysm of heart: Secondary | ICD-10-CM | POA: Diagnosis present

## 2016-07-23 DIAGNOSIS — Z885 Allergy status to narcotic agent status: Secondary | ICD-10-CM | POA: Diagnosis not present

## 2016-07-23 DIAGNOSIS — I272 Pulmonary hypertension, unspecified: Secondary | ICD-10-CM | POA: Diagnosis present

## 2016-07-23 DIAGNOSIS — Z881 Allergy status to other antibiotic agents status: Secondary | ICD-10-CM | POA: Diagnosis not present

## 2016-07-23 DIAGNOSIS — J9622 Acute and chronic respiratory failure with hypercapnia: Secondary | ICD-10-CM | POA: Diagnosis present

## 2016-07-23 DIAGNOSIS — I10 Essential (primary) hypertension: Secondary | ICD-10-CM | POA: Diagnosis present

## 2016-07-23 LAB — URINALYSIS, ROUTINE W REFLEX MICROSCOPIC
BILIRUBIN URINE: NEGATIVE
GLUCOSE, UA: NEGATIVE mg/dL
KETONES UR: NEGATIVE mg/dL
Leukocytes, UA: NEGATIVE
NITRITE: NEGATIVE
PH: 6.5 (ref 5.0–8.0)
Protein, ur: NEGATIVE mg/dL
SPECIFIC GRAVITY, URINE: 1.019 (ref 1.005–1.030)

## 2016-07-23 LAB — LEGIONELLA PNEUMOPHILA SEROGP 1 UR AG: L. PNEUMOPHILA SEROGP 1 UR AG: NEGATIVE

## 2016-07-23 LAB — URINE MICROSCOPIC-ADD ON

## 2016-07-23 LAB — PROCALCITONIN: Procalcitonin: <0.1 ng/mL

## 2016-07-23 NOTE — Progress Notes (Signed)
PROGRESS NOTE    Holly Page  [id number] DOB: [birth year] DOA: 07/21/2016 PCP: No primary care provider on file. Brief Narrative: Holly Page is a 80 y.o. female with a past medical history significant for COPD GOLD D, FEV1 30% on 3L home O2, HTN and recurrent DVT no longer on [facility] who presented with worsening shortness of breath for 1-56months. She is followed by Pulm very closely, and saw Dr.Alva 10/5 and was not felt to be having a flare. Before that 9/14 saw [name] NP 10/21 seen in ED and given levaquin and Prendisone taper 10/23 called [facility] Pulm office and then came to ER. CXR without acute findings  Assessment & Plan:  1. COPD exacerbation -not severe  -rare wheezes and poor air movement -IV steroids today, nebs, Abx -CXR without acute findings  2. Progressive dyspnea -for 54months -check 2d ECHO -no significant wheezing -eval for severe pulm HTN -would benefit from Pulm rehab too -Pulm may need to bring up hospice at follow uo  3. Anemia:  Microcytic.  Iron stores normal earlier this year.   -Continue iron supplement  4. Severe protein calorie malnutrition  5. . Anxiety:  -Continue alprazolam PRN  6. History of DVT:  Suspicion for PE low. No longer on anticoagulation.  7. HTN:  -Continue verapamil   DVT prophylaxis: Lovenox  Code Status: FULL  Family Communication: None present  Disposition Plan: home tomorrow    Subjective: Denies any chest pain, feeling better, whenever she is on prednisone she feels better and whenever the taper it started she starts having shortness of breath. I told her that this is likely progression of her COPD  Objective: Vitals:   07/23/16 0611 07/23/16 0720 07/23/16 1432 07/23/16 1459  BP: 140/67  (!) 128/53   Pulse: (!) 122  66   Resp: 18  16   Temp: 98.1 F (36.7 C)  98.5 F (36.9 C)   TempSrc: Oral  Oral   SpO2: 100% 100% 100% 100%  Weight:      Height:        Intake/Output Summary (Last  24 hours) at 07/23/16 1959 Last data filed at 07/23/16 1553  Gross per 24 hour  Intake              600 ml  Output              750 ml  Net             -150 ml   Filed Weights   07/21/16 1050 07/21/16 1854  Weight: 45.8 kg (101 lb) 47.6 kg (105 lb)    Examination:  General exam: frail, cachectic, no distress Respiratory system: poor air movement, rare wheezes Cardiovascular system: S1 & S2 heard, RRR. No JVD, murmurs, rubs, gallops or clicks. No pedal edema. Gastrointestinal system: Abdomen is nondistended, soft and nontender. No organomegaly or masses felt. Normal bowel sounds heard. Central nervous system: Alert and oriented. No focal neurological deficits. Extremities: Symmetric 5 x 5 power. Skin: No rashes, lesions or ulcers Psychiatry: anxious    Data Reviewed: I have personally reviewed following labs and imaging studies  CBC:  Recent Labs Lab 07/19/16 1900 07/21/16 1145 07/22/16 0305  WBC 10.9* 9.0 6.3  NEUTROABS 9.1* 8.2*  --   HGB 10.0* 9.6* 9.2*  HCT 35.4* 33.2* 32.4*  MCV 80.5 79.0 78.1  PLT 228 303 [id number]   Basic Metabolic Panel:  Recent Labs Lab 07/19/16 1816 07/21/16 1220 07/22/16 0305  NA 144 143  144  K 3.7 3.7 4.1  CL 96* 98* 99*  CO2 41* 40* 38*  GLUCOSE 112* 116* 94  BUN 11 11 12   CREATININE 0.39* 0.44 0.45  CALCIUM 9.8 9.8 9.4   GFR: Estimated Creatinine Clearance: 43.6 mL/min (by C-G formula based on SCr of 0.45 mg/dL). Liver Function Tests:  Recent Labs Lab 07/21/16 1220  AST 48*  ALT 22  ALKPHOS 59  BILITOT 0.4  PROT 6.9  ALBUMIN 3.8   No results for input(s): LIPASE, AMYLASE in the last 168 hours. No results for input(s): AMMONIA in the last 168 hours. Coagulation Profile:  Recent Labs Lab 07/21/16 1220  INR 1.13   Cardiac Enzymes:  Recent Labs Lab 07/21/16 1145 07/21/16 1946 07/22/16 0305  TROPONINI <0.03 0.13* <0.03   BNP (last 3 results) No results for input(s): PROBNP in the last 8760 hours. HbA1C: No  results for input(s): HGBA1C in the last 72 hours. CBG: No results for input(s): GLUCAP in the last 168 hours. Lipid Profile: No results for input(s): CHOL, HDL, LDLCALC, TRIG, CHOLHDL, LDLDIRECT in the last 72 hours. Thyroid Function Tests: No results for input(s): TSH, T4TOTAL, FREET4, T3FREE, THYROIDAB in the last 72 hours. Anemia Panel: No results for input(s): VITAMINB12, FOLATE, FERRITIN, TIBC, IRON, RETICCTPCT in the last 72 hours. Urine analysis:    Component Value Date/Time   COLORURINE YELLOW 08/30/2015 2317   APPEARANCEUR CLEAR 08/30/2015 2317   LABSPEC 1.025 08/30/2015 2317   PHURINE 7.5 08/30/2015 2317   GLUCOSEU NEGATIVE 08/30/2015 2317   HGBUR SMALL (A) 08/30/2015 2317   BILIRUBINUR NEGATIVE 08/30/2015 2317   BILIRUBINUR negative 06/29/2015 1440   KETONESUR 15 (A) 08/30/2015 2317   PROTEINUR NEGATIVE 08/30/2015 2317   UROBILINOGEN negative 06/29/2015 1440   UROBILINOGEN 0.2 08/13/2014 2003   NITRITE NEGATIVE 08/30/2015 2317   LEUKOCYTESUR NEGATIVE 08/30/2015 2317   Sepsis Labs: @LABRCNTIP (procalcitonin:4,lacticidven:4)  )No results found for this or any previous visit (from the past 240 hour(s)).       Radiology Studies: No results found.      Scheduled Meds: . albuterol  2.5 mg Nebulization Q6H  . azithromycin  250 mg Oral Daily  . enoxaparin (LOVENOX) injection  40 mg Subcutaneous Q24H  . famotidine  40 mg Oral QHS  . feeding supplement (ENSURE ENLIVE)  237 mL Oral TID BM  . ferrous sulfate   Oral Daily  . fluticasone furoate-vilanterol  1 puff Inhalation Daily  . methylPREDNISolone (SOLU-MEDROL) injection  40 mg Intravenous Q12H  . potassium chloride SA  20 mEq Oral Daily  . verapamil  240 mg Oral Q48H  . verapamil  480 mg Oral Q48H   Continuous Infusions:    LOS: 0 days    Time spent: 54min    Author: Berle Mull, MD [facility] 07/23/2016 7:59 PM    If 7PM-7AM, please contact night-coverage [email or web address] Password  [email or web address] 07/23/2016, 7:59 PM

## 2016-07-23 NOTE — Progress Notes (Signed)
Pt is complaining of a new onset urgency and incontinence. NP Baltazar Najjar notified. Physician sticky notes updated. Will continue to monitor.

## 2016-07-24 ENCOUNTER — Inpatient Hospital Stay (HOSPITAL_COMMUNITY): Payer: Medicare Other

## 2016-07-24 DIAGNOSIS — R0600 Dyspnea, unspecified: Secondary | ICD-10-CM

## 2016-07-24 LAB — BASIC METABOLIC PANEL
ANION GAP: 6 (ref 5–15)
BUN: 14 mg/dL (ref 6–20)
CO2: 42 mmol/L — ABNORMAL HIGH (ref 22–32)
Calcium: 8.9 mg/dL (ref 8.9–10.3)
Chloride: 95 mmol/L — ABNORMAL LOW (ref 101–111)
Creatinine, Ser: 0.43 mg/dL — ABNORMAL LOW (ref 0.44–1.00)
Glucose, Bld: 104 mg/dL — ABNORMAL HIGH (ref 65–99)
POTASSIUM: 3.8 mmol/L (ref 3.5–5.1)
SODIUM: 143 mmol/L (ref 135–145)

## 2016-07-24 LAB — CBC
HCT: 33 % — ABNORMAL LOW (ref 36.0–46.0)
HEMOGLOBIN: 9 g/dL — AB (ref 12.0–15.0)
MCH: 22 pg — ABNORMAL LOW (ref 26.0–34.0)
MCHC: 27.3 g/dL — ABNORMAL LOW (ref 30.0–36.0)
MCV: 80.7 fL (ref 78.0–100.0)
PLATELETS: 321 10*3/uL (ref 150–400)
RBC: 4.09 MIL/uL (ref 3.87–5.11)
RDW: 15.2 % (ref 11.5–15.5)
WBC: 8.1 10*3/uL (ref 4.0–10.5)

## 2016-07-24 LAB — ECHOCARDIOGRAM COMPLETE
HEIGHTINCHES: 65 in
Weight: 1680 oz

## 2016-07-24 MED ORDER — PREDNISONE 50 MG PO TABS
50.0000 mg | ORAL_TABLET | Freq: Every day | ORAL | Status: DC
  Administered 2016-07-24: 50 mg via ORAL
  Filled 2016-07-24: qty 1

## 2016-07-24 MED ORDER — AZITHROMYCIN 250 MG PO TABS: 250.0000 mg | ORAL_TABLET | Freq: Every day | ORAL | 0 refills | Status: DC

## 2016-07-24 MED ORDER — ALBUTEROL SULFATE (2.5 MG/3ML) 0.083% IN NEBU
2.5000 mg | INHALATION_SOLUTION | Freq: Three times a day (TID) | RESPIRATORY_TRACT | Status: DC
  Administered 2016-07-24 (×2): 2.5 mg via RESPIRATORY_TRACT
  Filled 2016-07-24 (×2): qty 3

## 2016-07-24 MED ORDER — ALBUTEROL SULFATE (2.5 MG/3ML) 0.083% IN NEBU: 2.5000 mg | INHALATION_SOLUTION | Freq: Four times a day (QID) | RESPIRATORY_TRACT | Status: DC | PRN

## 2016-07-24 MED ORDER — SACCHAROMYCES BOULARDII 250 MG PO CAPS
250.0000 mg | ORAL_CAPSULE | Freq: Two times a day (BID) | ORAL | 0 refills | Status: DC
Start: 2016-07-24 — End: 2016-08-14

## 2016-07-24 MED ORDER — PREDNISONE 10 MG PO TABS: ORAL_TABLET | ORAL | 0 refills | Status: DC

## 2016-07-24 NOTE — Progress Notes (Signed)
  Echocardiogram 2D Echocardiogram has been performed.  Tresa Res 07/24/2016, 11:34 AM

## 2016-07-24 NOTE — Evaluation (Signed)
Physical Therapy Evaluation Patient Details Name: Holly Page MRN: [id number] DOB: [birth year] Today's Date: 07/24/2016   History of Present Illness  Pt adm with COPD exacerbation. PMH - copd, dvt,   Clinical Impression  Pt doing well with mobility and no further PT needed.  Ready for dc from PT standpoint. Instructed in repeated sit to stands from chair at home x 5 reps for strengthening of legs.      Follow Up Recommendations No PT follow up    Equipment Recommendations  None recommended by PT    Recommendations for Other Services       Precautions / Restrictions Precautions Precautions: None      Mobility  Bed Mobility               General bed mobility comments: Pt up in chair  Transfers Overall transfer level: Independent Equipment used: None                Ambulation/Gait Ambulation/Gait assistance: Modified independent (Device/Increase time) Ambulation Distance (Feet): 80 Feet Assistive device: None Gait Pattern/deviations: Step-through pattern;Decreased stride length     General Gait Details: Steady gait. Dyspnea 2/4 on O2. Pursed lipped breathing after amb  Stairs            Wheelchair Mobility    Modified Rankin (Stroke Patients Only)       Balance Overall balance assessment: Modified Independent                                           Pertinent Vitals/Pain Pain Assessment: No/denies pain    Home Living Family/patient expects to be discharged to:: Private residence Living Arrangements: Alone Available Help at Discharge: Family;Available PRN/intermittently Type of Home: House Home Access: Stairs to enter   [employer] of Steps: 2 Home Layout: One level Home Equipment: None Additional Comments: oxygen at home    Prior Function Level of Independence: Independent               Hand Dominance        Extremity/Trunk Assessment   Upper Extremity Assessment: Overall WFL  for tasks assessed           Lower Extremity Assessment: Overall WFL for tasks assessed         Communication   Communication: No difficulties  Cognition Arousal/Alertness: Awake/alert Behavior During Therapy: WFL for tasks assessed/performed Overall Cognitive Status: Within Functional Limits for tasks assessed                      General Comments      Exercises     Assessment/Plan    PT Assessment Patent does not need any further PT services  PT Problem List            PT Treatment Interventions      PT Goals (Current goals can be found in the Care Plan section)  Acute Rehab PT Goals PT Goal Formulation: All assessment and education complete, DC therapy    Frequency     Barriers to discharge        Co-evaluation               End of Session Equipment Utilized During Treatment: Oxygen Activity Tolerance: Patient tolerated treatment well Patient left: in chair Nurse Communication: Mobility status         Time: [id number] PT Time  Calculation (min) (ACUTE ONLY): 8 min   Charges:   PT Evaluation $PT Eval Low Complexity: 1 Procedure     PT G Codes:        Quaneisha Hanisch 08/21/16, 3:22 PM [facility] PT [phone]

## 2016-07-26 DIAGNOSIS — J449 Chronic obstructive pulmonary disease, unspecified: Secondary | ICD-10-CM | POA: Diagnosis not present

## 2016-07-28 DIAGNOSIS — E785 Hyperlipidemia, unspecified: Secondary | ICD-10-CM | POA: Diagnosis not present

## 2016-07-28 DIAGNOSIS — J42 Unspecified chronic bronchitis: Secondary | ICD-10-CM | POA: Diagnosis not present

## 2016-07-28 DIAGNOSIS — G629 Polyneuropathy, unspecified: Secondary | ICD-10-CM | POA: Diagnosis not present

## 2016-07-28 DIAGNOSIS — I10 Essential (primary) hypertension: Secondary | ICD-10-CM | POA: Diagnosis not present

## 2016-07-28 DIAGNOSIS — Z9981 Dependence on supplemental oxygen: Secondary | ICD-10-CM | POA: Diagnosis not present

## 2016-07-28 NOTE — Discharge Summary (Signed)
[facility] Discharge Summary   Patient: Holly Page [id number]   PCP: Benito Mccreedy, MD DOB: May 28, 1936   Date of admission: 07/21/2016   Date of discharge: 07/24/2016     Discharge Diagnoses:  Principal Problem:   COPD with exacerbation ([place]) Active Problems:   History of DVT (deep vein thrombosis)   HTN (hypertension)   Malnutrition of moderate degree   Anxiety   RBC microcytosis   SOB (shortness of breath)   Acute on chronic respiratory failure with hypoxia and hypercapnia (HCC)   Protein-calorie malnutrition, severe   Shortness of breath   Admitted From: Home Disposition:  Home  Recommendations for Outpatient Follow-up:  1. Follow-up with pulmonary in 2 weeks. 2. Follow-up with PCP in one week   Follow-up Information    Rexene Edison, NP. Schedule an appointment as soon as possible for a visit in 2 weeks.   Specialty:  Pulmonary Disease Why:  not there patient Contact information: [address] [place] [address] [phone]        OSEI-BONSU,GEORGE, MD. Schedule an appointment as soon as possible for a visit in 1 week.   Specialty:  Internal Medicine Why:  office closed for lunch Contact information: [id number] [place] [address] [phone]          Diet recommendation: Cardiac diet  Activity: The patient is advised to gradually reintroduce usual activities.  Discharge Condition: good  Code Status: Full code  History of present illness: As per the H and P dictated on admission, "Holly Page is a 80 y.o. female with a past medical history significant for COPD GOLD D, FEV1 30% on 3L home O2, HTN and recurrent DVT no longer on [facility] who presents with shortness of breath with wheezing and cough for 4 weeks, now worsening.  The patient first developed increasing shortness of breath with exertion about 3-4 weeks ago.  She saw her pulmonologist around that time, but didn't complain of increased  dyspnea, and "he listened to my lungs but didn't say anything".  At baseline she can ambulate around her house without difficulty, but in the last month has been unable to walk across the room without having to stop for breath.  Several days ago she called her Pulmology office and was prescribed prednisone taper by phone and was told to go to the ER if her symptoms worsened.  Two days ago she went to the ER (she had not started prednisone yet) for SOB, cough.  There she was diagnosed with pneumonia and COPD flare and prescribed prednisone 40 mg for 5 days and Levaquin.    She took the Levaquin once on Sunday but it caused headache and ankle pain so she stopped.  She took prednisone yesterday and today, but returned to the ER today because her symptoms were not improved.  She had some subjective fevers and chills last week, no sputum.  She has had increased cough and dyspnea on exertion and wheezing, worsening in the last two days. "  Hospital Course:  Summary of her active problems in the hospital is as following.  1. COPD exacerbation -not severe  -rare wheezes and poor air movement -CXR without acute findings I explained the patient regarding progressive nature of the COPD as it appears that patient's persistent shortness of breath is her new baseline. I recommended patient to follow-up with pulmonary in 2 weeks. Patient ambulated with physical therapy as well as on her own and her saturation remained stable on  her home oxygen setting despite symptomatic shortness of breath.  2. Progressive dyspnea Chronic diastolic dysfunction. Mild pulmonary hypertension.  -for 31months -2d ECHO shows diastolic dysfunction as well as mild pulmonary hypertension. Patient clinically does not appear volume overloaded. -no significant wheezing -would benefit from Pulm rehab too -Pulm may need to bring up hospice at follow up, due to patient's progressive dyspnea.  3. Anemia: Microcytic. Iron stores  normal earlier this year.  -Continue iron supplement  4. Severe protein calorie malnutrition  5. . Anxiety: -Continue alprazolam PRN  6. History of DVT: Suspicion for PE low. No longer on anticoagulation.  7. HTN: -Continue verapamil   All other chronic medical condition were stable during the hospitalization.  Patient was seen by physical therapy, who recommended no PT follow-up required On the day of the discharge the patient's vitals are stable, and no other acute medical condition were reported by patient. the patient was felt safe to be discharge at home with family.  Procedures and Results:  Echocardiogram  Study Conclusions  - Left ventricle: The cavity size was normal. Wall thickness was   normal. Systolic function was normal. The estimated ejection   fraction was in the range of 60% to 65%. Wall motion was normal;   there were no regional wall motion abnormalities. Features are   consistent with a pseudonormal left ventricular filling pattern,   with concomitant abnormal relaxation and increased filling   pressure (grade 2 diastolic dysfunction). - Mitral valve: Calcified annulus. - Atrial septum: There was an atrial septal aneurysm. - Pulmonary arteries: Systolic pressure was mildly increased. PA   peak pressure: 35 mm Hg (S).  Consultations:  None  DISCHARGE MEDICATION: Discharge Medication List as of 07/24/2016  3:13 PM    START taking these medications   Details  azithromycin (ZITHROMAX) 250 MG tablet Take 1 tablet (250 mg total) by mouth daily., Starting Thu 07/24/2016, Normal    predniSONE (DELTASONE) 10 MG tablet Take 50mg  daily for 3days,Take 40mg  daily for 3days,Take 30mg  daily for 3days,Take 20mg  daily for 3days,Take 10mg  daily for 3days, then stop., Print    saccharomyces boulardii (FLORASTOR) 250 MG capsule Take 1 capsule (250 mg total) by mouth 2 (two) times daily., Starting Thu 07/24/2016, Normal      CONTINUE these medications  which have NOT CHANGED   Details  acetaminophen-codeine (TYLENOL #3) 300-30 MG tablet Take 1-2 tablets by mouth every 8 (eight) hours as needed for moderate pain or severe pain., Starting Mon 05/12/2016, Print    albuterol (PROVENTIL HFA;VENTOLIN HFA) 108 (90 Base) MCG/ACT inhaler Inhale 2 puffs into the lungs every 6 (six) hours as needed for wheezing or shortness of breath., Starting 02/13/2016, Until Discontinued, Normal    albuterol (PROVENTIL) (2.5 MG/3ML) 0.083% nebulizer solution TAKE 3 ML'S BY NEBULIZATION EVERY 6 HOURS AS NEEDED FOR SHORTNESS OF BREATH AND AS NEEDED, Normal    ALPRAZolam (XANAX) 0.25 MG tablet Take 1/2 - 1 tablet by mouth daily as needed, Print    cholecalciferol (VITAMIN D) 1000 UNITS tablet Take 1,000 Units by mouth daily., Until Discontinued, Historical Med    feeding supplement (BOOST / RESOURCE BREEZE) LIQD Take 1 Container by mouth 3 (three) times daily between meals., Starting Fri 03/21/2016, Normal    Ferrous Sulfate (IRON SLOW RELEASE) 143 (45 Fe) MG TBCR Take 143 mg by mouth daily. , Historical Med    fluticasone furoate-vilanterol (BREO ELLIPTA) 100-25 MCG/INH AEPB Inhale 1 puff into the lungs daily., Starting Thu 07/03/2016, Until Sat 08/02/2016, Sample  Multiple Vitamins-Minerals (MULTIVITAMIN & MINERAL PO) Take 1 tablet by mouth daily., Until Discontinued, Historical Med    OXYGEN Inhale 3 L/hr into the lungs., Until Discontinued, Historical Med    potassium chloride SA (K-DUR,KLOR-CON) 20 MEQ tablet Take 1 tablet (20 mEq total) by mouth daily., Starting Fri 04/18/2016, Normal    ranitidine (ZANTAC) 300 MG tablet Take 1 tablet (300 mg total) by mouth at bedtime. 1 tablet at bedtime, Starting Mon 04/07/2016, Until Tue 04/07/2017, Normal    sodium chloride (OCEAN) 0.65 % SOLN nasal spray Place 2 sprays into both nostrils as needed for congestion. Reported on 12/31/2015, Until Discontinued, Historical Med    solifenacin (VESICARE) 5 MG tablet Take 5 mg by  mouth daily as needed (for bladder). , Historical Med    verapamil (CALAN-SR) 240 MG CR tablet Take 2 tablets (480 mg total) by mouth daily., Starting 01/09/2016, Until Discontinued, Normal       Allergies  Allergen Reactions  . Aspirin Other (See Comments)    Other reaction(s): PALPITATIONS Other reaction(s): DIFFICULTY BREATHING Heart flutter  . Incruse Ellipta [Umeclidinium Bromide] Shortness Of Breath  . Tramadol Shortness Of Breath  . Amlodipine Other (See Comments)    Other reaction(s): SWELLING  . Codeine Other (See Comments)    Hallucinations, can take Tylenol #3 now  . Doxycycline Other (See Comments)    Other reaction(s): NAUSEA,VOMITING  . Gabapentin Other (See Comments)    Other reaction(s): Other (See Comments) She could not swallow the large pill  . Hydrocodone Other (See Comments)    hallucinations  . Losartan Other (See Comments)    unknown  . Propoxyphene Nausea Only  . Tiotropium Itching and Rash   Discharge Instructions    Diet - low sodium heart healthy    Complete by:  As directed    Discharge instructions    Complete by:  As directed    It is important that you read following instructions as well as go over your medication list with RN to help you understand your care after this hospitalization.  Discharge Instructions: Please follow-up with PCP in one week  Please request your primary care physician to go over all Hospital Tests and Procedure/Radiological results at the follow up,  Please get all Hospital records sent to your PCP by signing hospital release before you go home.   Do not take more than prescribed Pain, Sleep and Anxiety Medications. You were cared for by a hospitalist during your hospital stay. If you have any questions about your discharge medications or the care you received while you were in the hospital after you are discharged, you can call the unit and ask to speak with the hospitalist on call if the hospitalist that took care of  you is not available.  Once you are discharged, your primary care physician will handle any further medical issues. Please note that NO REFILLS for any discharge medications will be authorized once you are discharged, as it is imperative that you return to your primary care physician (or establish a relationship with a primary care physician if you do not have one) for your aftercare needs so that they can reassess your need for medications and monitor your lab values. You Must read complete instructions/literature along with all the possible adverse reactions/side effects for all the Medicines you take and that have been prescribed to you. Take any new Medicines after you have completely understood and accept all the possible adverse reactions/side effects. Wear Seat belts while driving. If  you have smoked or chewed Tobacco in the last 2 yrs please stop smoking and/or stop any Recreational drug use.   Increase activity slowly    Complete by:  As directed      Discharge Exam: Filed Weights   07/21/16 1050 07/21/16 1854  Weight: 45.8 kg (101 lb) 47.6 kg (105 lb)   Vitals:   07/24/16 0606 07/24/16 0910  BP: 113/65 118/70  Pulse: 74 78  Resp: 20   Temp: 98.3 F (36.8 C)    General: Appear in mild distress, no Rash; Oral Mucosa moist. Cardiovascular: S1 and S2 Present, no Murmur, no JVD Respiratory: Bilateral Air entry present and Clear to Auscultation, no Crackles, no wheezes Abdomen: Bowel Sound present, Soft and no tenderness Extremities: no Pedal edema, no calf tenderness Neurology: Grossly no focal neuro deficit.  The results of significant diagnostics from this hospitalization (including imaging, microbiology, ancillary and laboratory) are listed below for reference.    Significant Diagnostic Studies: Dg Chest 2 View  Result Date: 07/21/2016 CLINICAL DATA:  Shortness of breath. EXAM: CHEST  2 VIEW COMPARISON:  07/19/2016 and CT chest 08/30/2015. FINDINGS: Trachea is midline.  Heart size normal. Lungs are hyperinflated with evidence of bullous emphysema. No airspace consolidation or pleural fluid. IMPRESSION: 1. No acute findings. 2.  Emphysema (ICD10-J43.9). Electronically Signed   By: Lorin Picket M.D.   On: 07/21/2016 12:02   Dg Chest 2 View  Result Date: 07/19/2016 CLINICAL DATA:  Left-sided chest pain 5 years worse over the past week under left breast. Productive cough which shortness-of-breath and congestion. Oxygen dependent. EXAM: CHEST  2 VIEW COMPARISON:  03/26/2016 FINDINGS: Lungs are hyperinflated with flattening of the hemidiaphragms. Mild left basilar interstitial prominence unchanged. Possible patchy opacification in the posterior left base which may be due to atelectasis or infection. Cardiomediastinal silhouette within normal. Calcified plaque over the aortic arch. Remainder of the exam unchanged. IMPRESSION: Mild patchy density in the left base which may be due to atelectasis or infection. Emphysematous disease with subtle chronic bibasilar interstitial prominence. Aortic atherosclerosis Electronically Signed   By: Marin Olp M.D.   On: 07/19/2016 19:10    Microbiology: No results found for this or any previous visit (from the past 240 hour(s)).   Labs: CBC:  Recent Labs Lab 07/21/16 1145 07/22/16 0305 07/24/16 0408  WBC 9.0 6.3 8.1  NEUTROABS 8.2*  --   --   HGB 9.6* 9.2* 9.0*  HCT 33.2* 32.4* 33.0*  MCV 79.0 78.1 80.7  PLT 303 286 [id number]   Basic Metabolic Panel:  Recent Labs Lab 07/21/16 1220 07/22/16 0305 07/24/16 0408  NA 143 144 143  K 3.7 4.1 3.8  CL 98* 99* 95*  CO2 40* 38* 42*  GLUCOSE 116* 94 104*  BUN 11 12 14   CREATININE 0.44 0.45 0.43*  CALCIUM 9.8 9.4 8.9   Liver Function Tests:  Recent Labs Lab 07/21/16 1220  AST 48*  ALT 22  ALKPHOS 59  BILITOT 0.4  PROT 6.9  ALBUMIN 3.8   No results for input(s): LIPASE, AMYLASE in the last 168 hours. No results for input(s): AMMONIA in the last 168 hours. Cardiac  Enzymes:  Recent Labs Lab 07/21/16 1145 07/21/16 1946 07/22/16 0305  TROPONINI <0.03 0.13* <0.03   BNP (last 3 results)  Recent Labs  08/30/15 1700 03/19/16 1140  BNP 44.8 35.2   CBG: No results for input(s): GLUCAP in the last 168 hours. Time spent: 30 minutes  Signed:  Berle Mull  [facility]  07/24/2016 , 8:03 AM

## 2016-07-31 DIAGNOSIS — J449 Chronic obstructive pulmonary disease, unspecified: Secondary | ICD-10-CM | POA: Diagnosis not present

## 2016-07-31 DIAGNOSIS — Z87891 Personal history of nicotine dependence: Secondary | ICD-10-CM | POA: Diagnosis not present

## 2016-07-31 DIAGNOSIS — K838 Other specified diseases of biliary tract: Secondary | ICD-10-CM | POA: Diagnosis not present

## 2016-07-31 DIAGNOSIS — Z9049 Acquired absence of other specified parts of digestive tract: Secondary | ICD-10-CM | POA: Diagnosis not present

## 2016-07-31 DIAGNOSIS — K5900 Constipation, unspecified: Secondary | ICD-10-CM | POA: Diagnosis not present

## 2016-07-31 DIAGNOSIS — K8689 Other specified diseases of pancreas: Secondary | ICD-10-CM | POA: Diagnosis not present

## 2016-07-31 DIAGNOSIS — R197 Diarrhea, unspecified: Secondary | ICD-10-CM | POA: Diagnosis not present

## 2016-07-31 DIAGNOSIS — R109 Unspecified abdominal pain: Secondary | ICD-10-CM | POA: Diagnosis not present

## 2016-07-31 DIAGNOSIS — R933 Abnormal findings on diagnostic imaging of other parts of digestive tract: Secondary | ICD-10-CM | POA: Diagnosis not present

## 2016-07-31 DIAGNOSIS — Z8249 Family history of ischemic heart disease and other diseases of the circulatory system: Secondary | ICD-10-CM | POA: Diagnosis not present

## 2016-07-31 DIAGNOSIS — R1310 Dysphagia, unspecified: Secondary | ICD-10-CM | POA: Diagnosis not present

## 2016-07-31 DIAGNOSIS — I10 Essential (primary) hypertension: Secondary | ICD-10-CM | POA: Diagnosis not present

## 2016-07-31 DIAGNOSIS — K219 Gastro-esophageal reflux disease without esophagitis: Secondary | ICD-10-CM | POA: Diagnosis not present

## 2016-08-11 DIAGNOSIS — I10 Essential (primary) hypertension: Secondary | ICD-10-CM | POA: Diagnosis not present

## 2016-08-11 DIAGNOSIS — Z9981 Dependence on supplemental oxygen: Secondary | ICD-10-CM | POA: Diagnosis not present

## 2016-08-11 DIAGNOSIS — G629 Polyneuropathy, unspecified: Secondary | ICD-10-CM | POA: Diagnosis not present

## 2016-08-11 DIAGNOSIS — E785 Hyperlipidemia, unspecified: Secondary | ICD-10-CM | POA: Diagnosis not present

## 2016-08-11 DIAGNOSIS — J42 Unspecified chronic bronchitis: Secondary | ICD-10-CM | POA: Diagnosis not present

## 2016-08-14 ENCOUNTER — Encounter: Payer: Self-pay | Admitting: Adult Health

## 2016-08-14 ENCOUNTER — Ambulatory Visit (INDEPENDENT_AMBULATORY_CARE_PROVIDER_SITE_OTHER): Payer: Medicare Other | Admitting: Adult Health

## 2016-08-14 DIAGNOSIS — J9611 Chronic respiratory failure with hypoxia: Secondary | ICD-10-CM

## 2016-08-14 DIAGNOSIS — J439 Emphysema, unspecified: Secondary | ICD-10-CM

## 2016-08-14 MED ORDER — FLUTICASONE FUROATE-VILANTEROL 100-25 MCG/INH IN AEPB: 1.0000 | INHALATION_SPRAY | Freq: Every day | RESPIRATORY_TRACT | 0 refills | Status: DC

## 2016-08-14 NOTE — Patient Instructions (Signed)
Continue on BREO 1 puff daily , rinse after use.   Continue on Albuterol Neb Four times a day  .  Continue on Pulmicort Neb Twice daily  .  Continue on Oxygen 4l/m with activity and 3l/m at rest .  Stress reducers with breathing exercises as we discussed.  Activity as tolerated.  Follow up in 6 weeks as planned and As needed   Please contact office for sooner follow up if symptoms do not improve or worsen or seek emergency care

## 2016-08-14 NOTE — Assessment & Plan Note (Signed)
Recent flare now resolving   Plan  Patient Instructions  Continue on BREO 1 puff daily , rinse after use.   Continue on Albuterol Neb Four times a day  .  Continue on Pulmicort Neb Twice daily  .  Continue on Oxygen 4l/m with activity and 3l/m at rest .  Stress reducers with breathing exercises as we discussed.  Activity as tolerated.  Follow up in 6 weeks as planned and As needed   Please contact office for sooner follow up if symptoms do not improve or worsen or seek emergency care

## 2016-08-14 NOTE — Progress Notes (Signed)
Subjective:    Patient ID: Holly Page, female    DOB: 1936-03-07, 80 y.o.   MRN: [id number]  HPI 80 y.o. F with Gold D Copd primary emphysema on O2 >GOLD card patient  Hx of DVT -05/2014 (tx w/ xarelto until 12/2014 ) -followed by hematology  Left arm superficial venous thrombus , Xarelto 01/25/15 >followed by Hematology >stopped 07/2015  CT chest, on March 19 2015 >that showed no evidence of pulmonary embolism  and stable. Severe COPD changes. Last spirometry showed FEV1 at 30% in 2015.  She has been tried on [place] but was unable to tolerate. Oxygen does drop with pulsing O2 on 2 L was able to keep above 90% on pulsing 3 L. Patient says she has been evaluated by cardiology with a negative stress test recently.    08/14/2016   Follow up :   : COPD /O2 dependent  Pt presents for a 6 week follow up for COPD  Currently on BREO daily and Pulmicort Neb Twice daily  along with albuterol nebs Four times a day   She has tried several inhalers and nebs in past., has several intolerances.  Was admitted last month for COPD exacerbation . Tx w/ abx and steroids . Follow up cxr showed no acute process.  She is feeling better, walked in office today. She is trying to move around more. Drove herself to ov today . No flare of cough or wheezing  Gets winded easily and this is progressively getting worse with frequent flares. We discussed stategies for anxiety and dyspnea. (pursed lip breathing , rest breaks) .  Gets tight in chest on occasion , especially if she get anxious.    Patient denies any hemoptysis, orthopnea, PND or chest pain. Prevnar/PVX are utd.  Declines flu shot today.   Gets meals on wheels , 1 meal daily for 5 days.   Wants letter to [place] for O2 in case of power outage.    Past Medical History:  Diagnosis Date  . COPD (chronic obstructive pulmonary disease) ([place])   . DVT (deep venous thrombosis) ([place]) 1980s, recurrent 2015   Right  DVT 2015  on xarelto  . GERD (gastroesophageal reflux disease)   . Hypertension   . Lactose intolerance   . Meningioma ([place])    followed by Dr Christella Noa  . Pneumonia   . Spinal stenosis    Current Outpatient Prescriptions on File Prior to Visit  Medication Sig Dispense Refill  . acetaminophen-codeine (TYLENOL #3) 300-30 MG tablet Take 1-2 tablets by mouth every 8 (eight) hours as needed for moderate pain or severe pain. 60 tablet 0  . albuterol (PROVENTIL HFA;VENTOLIN HFA) 108 (90 Base) MCG/ACT inhaler Inhale 2 puffs into the lungs every 6 (six) hours as needed for wheezing or shortness of breath. 1 Inhaler 5  . albuterol (PROVENTIL) (2.5 MG/3ML) 0.083% nebulizer solution TAKE 3 ML'S BY NEBULIZATION EVERY 6 HOURS AS NEEDED FOR SHORTNESS OF BREATH AND AS NEEDED 375 mL 11  . ALPRAZolam (XANAX) 0.25 MG tablet Take 1/2 - 1 tablet by mouth daily as needed (Patient taking differently: Take 0.125-0.25 mg by mouth daily as needed for anxiety. ) 30 tablet 0  . cholecalciferol (VITAMIN D) 1000 UNITS tablet Take 1,000 Units by mouth daily.    . feeding supplement (BOOST / RESOURCE BREEZE) LIQD Take 1 Container by mouth 3 (three) times daily between meals. (Patient taking differently: Take 1 Container by mouth daily. )  21 Container 0  . Ferrous Sulfate (IRON SLOW RELEASE) 143 (45 Fe) MG TBCR Take 143 mg by mouth daily.     . Multiple Vitamins-Minerals (MULTIVITAMIN & MINERAL PO) Take 1 tablet by mouth daily.    . OXYGEN Inhale 3 L/hr into the lungs.    . potassium chloride SA (K-DUR,KLOR-CON) 20 MEQ tablet Take 1 tablet (20 mEq total) by mouth daily. 90 tablet 2  . ranitidine (ZANTAC) 300 MG tablet Take 1 tablet (300 mg total) by mouth at bedtime. 1 tablet at bedtime (Patient taking differently: Take 300 mg by mouth at bedtime. ) 90 tablet 2  . sodium chloride (OCEAN) 0.65 % SOLN nasal spray Place 2 sprays into both nostrils as needed for congestion. Reported on 12/31/2015    . solifenacin (VESICARE) 5 MG  tablet Take 5 mg by mouth daily as needed (for bladder).     . verapamil (CALAN-SR) 240 MG CR tablet Take 2 tablets (480 mg total) by mouth daily. (Patient taking differently: Take 240-480 mg by mouth See admin instructions. Take 240 mg by mouth daily alternating every other day with 480 mg by mouth daily.) 60 tablet 5   No current facility-administered medications on file prior to visit.      Review of Systems  Constitutional:   No  weight loss, night sweats,  Fevers, chills, + fatigue, or  lassitude.  HEENT:   No headaches,  Difficulty swallowing,  Tooth/dental problems, or  Sore throat,                No sneezing, itching, ear ache,  CV:  No chest pain,  Orthopnea, PND, swelling in lower extremities, anasarca, dizziness, palpitations, syncope.   GI  Notes heartburn, no indigestion, abdominal pain, nausea, vomiting, diarrhea, change in bowel habits, loss of appetite, bloody stools.   Resp:  .    No chest wall deformity  Skin: no rash or lesions.  GU: no dysuria, change in color of urine, no urgency or frequency.  No flank pain, no hematuria   MS:  No joint pain or swelling.  No decreased range of motion.  No back pain.  Psych:  No change in mood or affect.+anxiety.  No memory loss.      Objective:   Physical Exam Vitals:   08/14/16 0953  BP: 134/66  Pulse: 82  SpO2: 96%  Weight: 103 lb (46.7 kg)  Height: 5\' 5"  (1.651 m)     GEN: A/Ox3; pleasant , NAD, elderly and frail , on O2 , calmer today ,walked in today, smiling more    HEENT:  [place]/AT,  EACs-clear, TMs-wnl, NOSE-clear, THROAT-clear, no lesions  NECK:  Supple w/ fair ROM; no JVD; normal carotid impulses w/o bruits; no thyromegaly or nodules palpated; no lymphadenopathy.    RESP  Diminished in bases w/ no wheezing or rhonchi. no accessory muscle use, no dullness to percussion  CARD:  RRR, no m/r/g  , no peripheral edema, pulses intact, no cyanosis or clubbing.  GI:   Soft & nt; nml bowel sounds; no organomegaly  or masses detected.   Musco: Warm bil, no deformities or joint swelling noted.    Neuro: alert, no focal deficits noted.  Anxious   Skin: Warm, no lesions or rashes    Vanda Waskey NP-C  [facility]  08/14/2016

## 2016-08-14 NOTE — Assessment & Plan Note (Signed)
Cont on O2 .  

## 2016-08-20 NOTE — Progress Notes (Signed)
Reviewed & agree with plan  

## 2016-08-25 DIAGNOSIS — J42 Unspecified chronic bronchitis: Secondary | ICD-10-CM | POA: Diagnosis not present

## 2016-08-25 DIAGNOSIS — G629 Polyneuropathy, unspecified: Secondary | ICD-10-CM | POA: Diagnosis not present

## 2016-08-25 DIAGNOSIS — E785 Hyperlipidemia, unspecified: Secondary | ICD-10-CM | POA: Diagnosis not present

## 2016-08-25 DIAGNOSIS — I10 Essential (primary) hypertension: Secondary | ICD-10-CM | POA: Diagnosis not present

## 2016-08-25 DIAGNOSIS — M545 Low back pain: Secondary | ICD-10-CM | POA: Diagnosis not present

## 2016-08-26 DIAGNOSIS — J449 Chronic obstructive pulmonary disease, unspecified: Secondary | ICD-10-CM | POA: Diagnosis not present

## 2016-09-05 ENCOUNTER — Ambulatory Visit (HOSPITAL_BASED_OUTPATIENT_CLINIC_OR_DEPARTMENT_OTHER): Payer: Medicare Other | Admitting: Hematology & Oncology

## 2016-09-05 ENCOUNTER — Other Ambulatory Visit (HOSPITAL_BASED_OUTPATIENT_CLINIC_OR_DEPARTMENT_OTHER): Payer: Medicare Other

## 2016-09-05 VITALS — BP 128/57 | HR 83 | Temp 98.3°F | Resp 20

## 2016-09-05 DIAGNOSIS — Z86718 Personal history of other venous thrombosis and embolism: Secondary | ICD-10-CM

## 2016-09-05 DIAGNOSIS — D508 Other iron deficiency anemias: Secondary | ICD-10-CM

## 2016-09-05 DIAGNOSIS — D649 Anemia, unspecified: Secondary | ICD-10-CM | POA: Diagnosis not present

## 2016-09-05 DIAGNOSIS — J430 Unilateral pulmonary emphysema [MacLeod's syndrome]: Secondary | ICD-10-CM

## 2016-09-05 LAB — COMPREHENSIVE METABOLIC PANEL
ALBUMIN: 3.8 g/dL (ref 3.5–5.0)
ALK PHOS: 73 U/L (ref 40–150)
ALT: 18 U/L (ref 0–55)
AST: 49 U/L — ABNORMAL HIGH (ref 5–34)
Anion Gap: 9 mEq/L (ref 3–11)
BUN: 11.7 mg/dL (ref 7.0–26.0)
CALCIUM: 10 mg/dL (ref 8.4–10.4)
CO2: 37 mEq/L — ABNORMAL HIGH (ref 22–29)
Chloride: 100 mEq/L (ref 98–109)
Creatinine: 0.7 mg/dL (ref 0.6–1.1)
Glucose: 100 mg/dl (ref 70–140)
POTASSIUM: 3.8 meq/L (ref 3.5–5.1)
Sodium: 145 mEq/L (ref 136–145)
Total Bilirubin: 0.39 mg/dL (ref 0.20–1.20)
Total Protein: 7.2 g/dL (ref 6.4–8.3)

## 2016-09-05 LAB — FERRITIN: Ferritin: 318 ng/ml — ABNORMAL HIGH (ref 9–269)

## 2016-09-05 LAB — CBC WITH DIFFERENTIAL (CANCER CENTER ONLY)
BASO#: 0 10*3/uL (ref 0.0–0.2)
BASO%: 0.4 % (ref 0.0–2.0)
EOS%: 1.6 % (ref 0.0–7.0)
Eosinophils Absolute: 0.1 10*3/uL (ref 0.0–0.5)
HEMATOCRIT: 36.8 % (ref 34.8–46.6)
HGB: 10.7 g/dL — ABNORMAL LOW (ref 11.6–15.9)
LYMPH#: 2.5 10*3/uL (ref 0.9–3.3)
LYMPH%: 44.4 % (ref 14.0–48.0)
MCH: 23.1 pg — ABNORMAL LOW (ref 26.0–34.0)
MCHC: 29.1 g/dL — ABNORMAL LOW (ref 32.0–36.0)
MCV: 80 fL — ABNORMAL LOW (ref 81–101)
MONO#: 0.6 10*3/uL (ref 0.1–0.9)
MONO%: 11.1 % (ref 0.0–13.0)
NEUT#: 2.4 10*3/uL (ref 1.5–6.5)
NEUT%: 42.5 % (ref 39.6–80.0)
PLATELETS: 301 10*3/uL (ref 145–400)
RBC: 4.63 10*6/uL (ref 3.70–5.32)
RDW: 15.6 % (ref 11.1–15.7)
WBC: 5.6 10*3/uL (ref 3.9–10.0)

## 2016-09-05 LAB — IRON AND TIBC
%SAT: 34 % (ref 21–57)
IRON: 110 ug/dL (ref 41–142)
TIBC: 329 ug/dL (ref 236–444)
UIBC: 218 ug/dL (ref 120–384)

## 2016-09-05 NOTE — Progress Notes (Signed)
Hematology and Oncology Follow Up Visit  Holly Page [id number] 10-16-1935 80 y.o. 09/05/2016   Principle Diagnosis:  Thromboembolic disease of the right thigh Superficial thrombophlebitis of the left arm  Current Therapy:   Observation  Interim History:  Holly Page is here today for follow-up. . She is doing okay. She has not lost anymore weight. She still has the pulmonary issues. She has some arthritis issues.  She is on supplemental oxygen. I think the weight loss that she had was probably secondary to some pulmonary cachexia.  She has had no obvious bleeding. She's had some constipation. She is on some pain medication.  She's had no fever. She's had no rashes. She's had no nausea or vomiting.  Overall, I would say that  her performance status is ECOG 3.  Medications:    Medication List       Accurate as of 09/05/16 11:47 AM. Always use your most recent med list.          acetaminophen-codeine 300-30 MG tablet Commonly known as:  TYLENOL #3 Take 1-2 tablets by mouth every 8 (eight) hours as needed for moderate pain or severe pain.   albuterol (2.5 MG/3ML) 0.083% nebulizer solution Commonly known as:  PROVENTIL TAKE 3 ML'S BY NEBULIZATION EVERY 6 HOURS AS NEEDED FOR SHORTNESS OF BREATH AND AS NEEDED   albuterol 108 (90 Base) MCG/ACT inhaler Commonly known as:  PROVENTIL HFA;VENTOLIN HFA Inhale 2 puffs into the lungs every 6 (six) hours as needed for wheezing or shortness of breath.   ALPRAZolam 0.25 MG tablet Commonly known as:  XANAX Take 1/2 - 1 tablet by mouth daily as needed   cholecalciferol 1000 units tablet Commonly known as:  VITAMIN D Take 1,000 Units by mouth daily.   feeding supplement Liqd Take 1 Container by mouth 3 (three) times daily between meals.   fluticasone furoate-vilanterol 100-25 MCG/INH Aepb Commonly known as:  BREO ELLIPTA Inhale 1 puff into the lungs daily.   IRON SLOW RELEASE 143 (45 Fe) MG Tbcr Generic drug:  Ferrous  Sulfate Take 143 mg by mouth daily.   MULTIVITAMIN & MINERAL PO Take 1 tablet by mouth daily.   OXYGEN Inhale 3 L/hr into the lungs.   potassium chloride SA 20 MEQ tablet Commonly known as:  K-DUR,KLOR-CON Take 1 tablet (20 mEq total) by mouth daily.   ranitidine 300 MG tablet Commonly known as:  ZANTAC Take 1 tablet (300 mg total) by mouth at bedtime. 1 tablet at bedtime   sodium chloride 0.65 % Soln nasal spray Commonly known as:  OCEAN Place 2 sprays into both nostrils as needed for congestion. Reported on 12/31/2015   solifenacin 5 MG tablet Commonly known as:  VESICARE Take 5 mg by mouth daily as needed (for bladder).   verapamil 240 MG CR tablet Commonly known as:  CALAN-SR Take 2 tablets (480 mg total) by mouth daily.       Allergies:  Allergies  Allergen Reactions  . Aspirin Other (See Comments)    Other reaction(s): PALPITATIONS Other reaction(s): DIFFICULTY BREATHING Heart flutter  . Incruse Ellipta [Umeclidinium Bromide] Shortness Of Breath  . Tramadol Shortness Of Breath  . Amlodipine Other (See Comments)    Other reaction(s): SWELLING  . Codeine Other (See Comments)    Hallucinations, can take Tylenol #3 now  . Doxycycline Other (See Comments)    Other reaction(s): NAUSEA,VOMITING  . Gabapentin Other (See Comments)    Other reaction(s): Other (See Comments) She could not swallow the large  pill  . Hydrocodone Other (See Comments)    hallucinations  . Losartan Other (See Comments)    unknown  . Propoxyphene Nausea Only  . Tiotropium Itching and Rash    Past Medical History, Surgical history, Social history, and Family History were reviewed and updated.  Review of Systems: All other 10 point review of systems is negative.   Physical Exam:  oral temperature is 98.3 F (36.8 C). Her blood pressure is 128/57 (abnormal) and her pulse is 83. Her respiration is 20 and oxygen saturation is 94%.   Wt Readings from Last 3 Encounters:  08/14/16 103 lb  (46.7 kg)  07/21/16 105 lb (47.6 kg)  07/19/16 101 lb (45.8 kg)    Well-developed and well-nourished Holly Page. Her head and neck exam shows no ocular or oral lesions. She has no palpable cervical or supraclavicular lymph nodes. Lungs are With decreased breath sounds bilaterally. I hear no wheezes.  Cardiac exam regular rate and rhythm with no murmurs, rubs or bruits. Abdomen is soft. She has good bowel sounds. There is no fluid wave. There is no palpable liver or spleen tip. Back exam shows no tenderness over the spine, ribs or hips. Extremities shows no swelling of the left arm. There is no venous cord noticed in the left upper arm. There is no palpable venous cords in the legs. She has good range of motion of her joints. She may have some slight nonpitting edema of the lower legs. Skin exam shows no rashes, ecchymoses or petechia. Lab Results  Component Value Date   WBC 5.6 09/05/2016   HGB 10.7 (L) 09/05/2016   HCT 36.8 09/05/2016   MCV 80 (L) 09/05/2016   PLT 301 09/05/2016   Lab Results  Component Value Date   FERRITIN 187 11/02/2015   IRON 87 11/02/2015   TIBC 329 11/02/2015   UIBC 242 11/02/2015   IRONPCTSAT 26 11/02/2015   Lab Results  Component Value Date   RBC 4.63 09/05/2016   No results found for: KPAFRELGTCHN, LAMBDASER, KAPLAMBRATIO No results found for: IGGSERUM, IGA, IGMSERUM No results found for: Odetta Pink, SPEI   Chemistry      Component Value Date/Time   NA 143 07/24/2016 0408   NA 139 07/04/2016 1002   NA 145 08/07/2015 0850   K 3.8 07/24/2016 0408   K 3.9 07/04/2016 1002   K 3.4 (L) 08/07/2015 0850   CL 95 (L) 07/24/2016 0408   CL 93 (L) 07/04/2016 1002   CO2 42 (H) 07/24/2016 0408   CO2 35 (H) 07/04/2016 1002   CO2 36 (H) 08/07/2015 0850   BUN 14 07/24/2016 0408   BUN 11 07/04/2016 1002   BUN 7.6 08/07/2015 0850   CREATININE 0.43 (L) 07/24/2016 0408   CREATININE 0.6 07/04/2016  1002   CREATININE 0.7 08/07/2015 0850      Component Value Date/Time   CALCIUM 8.9 07/24/2016 0408   CALCIUM 10.1 07/04/2016 1002   CALCIUM 9.8 08/07/2015 0850   ALKPHOS 59 07/21/2016 1220   ALKPHOS 71 07/04/2016 1002   ALKPHOS 74 08/07/2015 0850   AST 48 (H) 07/21/2016 1220   AST 51 (H) 07/04/2016 1002   AST 55 (H) 08/07/2015 0850   ALT 22 07/21/2016 1220   ALT 28 07/04/2016 1002   ALT 22 08/07/2015 0850   BILITOT 0.4 07/21/2016 1220   BILITOT 0.80 07/04/2016 1002   BILITOT 0.35 08/07/2015 0850     Impression and Plan: Ms.  Page is 80 year old African-American Page. She has a remote history of thrombus in the right thigh. This is back in the 80s.  Then she had a superficial thrombus in the left arm back in April.  Thankfully, her weight is holding steady.  Her hemoglobin is dropped a little bit. She does have the low MCV. We will see what her iron levels show.   I we have follow this closely. I want to see her back in another month.   Volanda Napoleon, MD 12/8/201711:47 AM

## 2016-09-08 ENCOUNTER — Telehealth: Payer: Self-pay | Admitting: *Deleted

## 2016-09-08 NOTE — Telephone Encounter (Addendum)
Patient aware of results  ----- Message from Volanda Napoleon, MD sent at 09/05/2016  2:35 PM EST ----- Call - iron level is ok!!  pete

## 2016-09-12 DIAGNOSIS — Z2821 Immunization not carried out because of patient refusal: Secondary | ICD-10-CM | POA: Diagnosis not present

## 2016-09-12 DIAGNOSIS — Z0000 Encounter for general adult medical examination without abnormal findings: Secondary | ICD-10-CM | POA: Diagnosis not present

## 2016-09-12 DIAGNOSIS — E785 Hyperlipidemia, unspecified: Secondary | ICD-10-CM | POA: Diagnosis not present

## 2016-09-12 DIAGNOSIS — Z01118 Encounter for examination of ears and hearing with other abnormal findings: Secondary | ICD-10-CM | POA: Diagnosis not present

## 2016-09-12 DIAGNOSIS — I10 Essential (primary) hypertension: Secondary | ICD-10-CM | POA: Diagnosis not present

## 2016-09-25 DIAGNOSIS — J449 Chronic obstructive pulmonary disease, unspecified: Secondary | ICD-10-CM | POA: Diagnosis not present

## 2016-10-02 ENCOUNTER — Ambulatory Visit: Payer: Medicare Other | Admitting: Adult Health

## 2016-10-08 ENCOUNTER — Telehealth: Payer: Self-pay | Admitting: Pulmonary Disease

## 2016-10-08 ENCOUNTER — Telehealth: Payer: Self-pay | Admitting: Hematology & Oncology

## 2016-10-08 ENCOUNTER — Telehealth: Payer: Self-pay | Admitting: Adult Health

## 2016-10-08 NOTE — Telephone Encounter (Signed)
Called by Holly Page d/t AECOPD. She denies fever, chills or productive cough. She requests a Prednisone taper. Call prescription for Prednisone 10 mg, Disp: #20, Sig: take 4 tablets daily X 2 days, then take 3 tablets daily X 2 days, then take 2 tablets daily X 2 days and then take 1 tablet daily X 2 days. No Refill. Advised the patient to go to the Emergency Department should her breathing get worse. If her breathing improves she should call the office in the AM for a follow up visit.

## 2016-10-08 NOTE — Telephone Encounter (Signed)
atc pt X2, no answer, line kept ringing.  Wcb.

## 2016-10-08 NOTE — Telephone Encounter (Signed)
Patient called and cx 10/10/16 apt and resch for 10/21/16

## 2016-10-09 ENCOUNTER — Encounter: Payer: Self-pay | Admitting: Adult Health

## 2016-10-09 ENCOUNTER — Ambulatory Visit (INDEPENDENT_AMBULATORY_CARE_PROVIDER_SITE_OTHER): Payer: Medicare Other | Admitting: Adult Health

## 2016-10-09 DIAGNOSIS — J439 Emphysema, unspecified: Secondary | ICD-10-CM | POA: Diagnosis not present

## 2016-10-09 DIAGNOSIS — E43 Unspecified severe protein-calorie malnutrition: Secondary | ICD-10-CM

## 2016-10-09 DIAGNOSIS — J9611 Chronic respiratory failure with hypoxia: Secondary | ICD-10-CM

## 2016-10-09 DIAGNOSIS — J9612 Chronic respiratory failure with hypercapnia: Secondary | ICD-10-CM | POA: Diagnosis not present

## 2016-10-09 MED ORDER — FLUTICASONE FUROATE-VILANTEROL 100-25 MCG/INH IN AEPB: 1.0000 | INHALATION_SPRAY | Freq: Every day | RESPIRATORY_TRACT | 0 refills | Status: DC

## 2016-10-09 MED ORDER — PREDNISONE 10 MG PO TABS: ORAL_TABLET | ORAL | 3 refills | Status: DC

## 2016-10-09 NOTE — Progress Notes (Signed)
@Patient  ID: Holly Page, female    DOB: 04/10/1936, 81 y.o.   MRN: [id number]  Chief Complaint  Patient presents with  . Acute Visit    COPD     Referring provider: Benito Mccreedy, MD  HPI: 81 y.o. F with Gold D Copd primary emphysema on O2 >GOLD card patient  Hx of DVT   TEST /Events  Hx of DVT -05/2014 (tx w/ xarelto until 12/2014 ) -followed by hematology  Left arm superficial venous thrombus , Xarelto 01/25/15 >followed by Hematology >stopped 07/2015  CT chest, on March 19 2015 >that showed no evidence of pulmonary embolism  and stable. Severe COPD changes. Last spirometry showed FEV1 at 30% in 2015.  Intolerant to [place]  Oxygen does drop with pulsing O2 on 2 L was able to keep above 90% on pulsing 3 L. Cardiology eval [facility] per pt with a negative stress test recently.  Pulmonary rehab 2015 at [facility]    10/09/2016 Acute OV : COPD GOLD D  Pt presents for an acute office visit. Complains of that breathing has not been as good for last month. More DOE , wears out easily . Has daily cough and intermittent wheezing. She has run out of [place] . Depends on samples. Can not afford.  Weight has trended down, down 4lbs at 99lbs today .  We discussed supplements such as ensure, she was using these in past but stopped several months ago. Went and got some this week.  She has chronic anxiety and worries a lot, discussed stress reducers .  She denies fever, hemoptyiss , chest pain or edema.     Allergies  Allergen Reactions  . Aspirin Other (See Comments)    Other reaction(s): PALPITATIONS Other reaction(s): DIFFICULTY BREATHING Heart flutter  . Incruse Ellipta [Umeclidinium Bromide] Shortness Of Breath  . Tramadol Shortness Of Breath  . Amlodipine Other (See Comments)    Other reaction(s): SWELLING  . Codeine Other (See Comments)    Hallucinations, can take Tylenol #3 now  . Doxycycline Other (See Comments)    Other reaction(s):  NAUSEA,VOMITING  . Gabapentin Other (See Comments)    Other reaction(s): Other (See Comments) She could not swallow the large pill  . Hydrocodone Other (See Comments)    hallucinations  . Losartan Other (See Comments)    unknown  . Propoxyphene Nausea Only  . Tiotropium Itching and Rash    Immunization History  Administered Date(s) Administered  . Pneumococcal Conjugate-13 08/09/2015  . Pneumococcal Polysaccharide-23 09/29/2008    Past Medical History:  Diagnosis Date  . COPD (chronic obstructive pulmonary disease) ([place])   . DVT (deep venous thrombosis) ([place]) 1980s, recurrent 2015   Right DVT 2015  on xarelto  . GERD (gastroesophageal reflux disease)   . Hypertension   . Lactose intolerance   . Meningioma ([place])    followed by Dr Christella Noa  . Pneumonia   . Spinal stenosis     Tobacco History: History  Smoking Status  . Former Smoker  . Packs/day: 1.50  . Years: 56.00  . Types: Cigarettes  . Quit date: 04/29/2012  Smokeless Tobacco  . Never Used    Comment: quit smoking 3 years ago   Counseling given: Not Answered   Outpatient Encounter Prescriptions as of 10/09/2016  Medication Sig  . acetaminophen-codeine (TYLENOL #3) 300-30 MG tablet Take 1-2 tablets by mouth every 8 (eight) hours as needed for moderate pain or severe pain.  [name] albuterol (PROVENTIL  HFA;VENTOLIN HFA) 108 (90 Base) MCG/ACT inhaler Inhale 2 puffs into the lungs every 6 (six) hours as needed for wheezing or shortness of breath.  [name] albuterol (PROVENTIL) (2.5 MG/3ML) 0.083% nebulizer solution TAKE 3 ML'S BY NEBULIZATION EVERY 6 HOURS AS NEEDED FOR SHORTNESS OF BREATH AND AS NEEDED  . ALPRAZolam (XANAX) 0.25 MG tablet Take 1/2 - 1 tablet by mouth daily as needed (Patient taking differently: Take 0.125-0.25 mg by mouth daily as needed for anxiety. )  . cholecalciferol (VITAMIN D) 1000 UNITS tablet Take 1,000 Units by mouth daily.  . feeding supplement (BOOST / RESOURCE BREEZE) LIQD Take 1 Container by mouth 3  (three) times daily between meals. (Patient taking differently: Take 1 Container by mouth daily. )  . Ferrous Sulfate (IRON SLOW RELEASE) 143 (45 Fe) MG TBCR Take 143 mg by mouth daily.   . fluticasone furoate-vilanterol (BREO ELLIPTA) 100-25 MCG/INH AEPB Inhale 1 puff into the lungs daily.  . Multiple Vitamins-Minerals (MULTIVITAMIN & MINERAL PO) Take 1 tablet by mouth daily.  . OXYGEN Inhale 3 L/hr into the lungs.  . potassium chloride SA (K-DUR,KLOR-CON) 20 MEQ tablet Take 1 tablet (20 mEq total) by mouth daily.  . ranitidine (ZANTAC) 300 MG tablet Take 1 tablet (300 mg total) by mouth at bedtime. 1 tablet at bedtime (Patient taking differently: Take 300 mg by mouth at bedtime. )  . sodium chloride (OCEAN) 0.65 % SOLN nasal spray Place 2 sprays into both nostrils as needed for congestion. Reported on 12/31/2015  . solifenacin (VESICARE) 5 MG tablet Take 5 mg by mouth daily as needed (for bladder).   . verapamil (CALAN-SR) 240 MG CR tablet Take 2 tablets (480 mg total) by mouth daily. (Patient taking differently: Take 240-480 mg by mouth See admin instructions. Take 240 mg by mouth daily alternating every other day with 480 mg by mouth daily.)  . predniSONE (DELTASONE) 10 MG tablet 4 tabs for 3 days, then 3 tabs for 3 days, 2 tabs for 3 days, then 1 tab daily.   No facility-administered encounter medications on file as of 10/09/2016.      Review of Systems  Constitutional:   No  night sweats,  Fevers, chills, fatigue, or  lassitude.  HEENT:   No headaches,  Difficulty swallowing,  Tooth/dental problems, or  Sore throat,                No sneezing, itching, ear ache, nasal congestion, post nasal drip,   CV:  No chest pain,  Orthopnea, PND, swelling in lower extremities, anasarca, dizziness, palpitations, syncope.   GI  No heartburn, indigestion, abdominal pain, nausea, vomiting, diarrhea, change in bowel habits, loss of appetite, bloody stools.   Resp:   No chest wall deformity  Skin: no  rash or lesions.  GU: no dysuria, change in color of urine, no urgency or frequency.  No flank pain, no hematuria   MS:  No joint pain or swelling.  No decreased range of motion.  No back pain.    Physical Exam  BP 132/74 (BP Location: Right Arm, Cuff Size: Normal)   Pulse 88   Ht 5\' 5"  (1.651 m)   Wt 99 lb (44.9 kg)   SpO2 96%   BMI 16.47 kg/m   GEN: A/Ox3; pleasant , NAD, thin frail, cachexic on O2 in WC   HEENT:  /AT,  EACs-clear, TMs-wnl, NOSE-clear, THROAT-clear, no lesions, no postnasal drip or exudate noted.   NECK:  Supple w/ fair ROM; no JVD; normal  carotid impulses w/o bruits; no thyromegaly or nodules palpated; no lymphadenopathy.    RESP  Decreased BS in bases , trace exp wheeze on forced exp . no accessory muscle use, no dullness to percussion  CARD:  RRR, no m/r/g, no peripheral edema, pulses intact, no cyanosis or clubbing.  GI:   Soft & nt; nml bowel sounds; no organomegaly or masses detected.   Musco: Warm bil, no deformities or joint swelling noted.   Neuro: alert, no focal deficits noted.    Skin: Warm, no lesions or rashes  Psych:  No change in mood or affect. No depression or anxiety.  No memory loss.  Lab Results:  CBC    Component Value Date/Time   WBC 5.6 09/05/2016 1004   WBC 8.1 07/24/2016 0408   RBC 4.63 09/05/2016 1004   RBC 4.09 07/24/2016 0408   HGB 10.7 (L) 09/05/2016 1004   HCT 36.8 09/05/2016 1004   PLT 301 09/05/2016 1004   MCV 80 (L) 09/05/2016 1004   MCH 23.1 (L) 09/05/2016 1004   MCH 22.0 (L) 07/24/2016 0408   MCHC 29.1 (L) 09/05/2016 1004   MCHC 27.3 (L) 07/24/2016 0408   RDW 15.6 09/05/2016 1004   LYMPHSABS 2.5 09/05/2016 1004   MONOABS 0.2 07/21/2016 1145   EOSABS 0.1 09/05/2016 1004   BASOSABS 0.0 09/05/2016 1004    BMET    Component Value Date/Time   NA 145 09/05/2016 1004   K 3.8 09/05/2016 1004   CL 95 (L) 07/24/2016 0408   CL 93 (L) 07/04/2016 1002   CO2 37 (H) 09/05/2016 1004   GLUCOSE 100 09/05/2016  1004   GLUCOSE 94 07/04/2016 1002   BUN 11.7 09/05/2016 1004   CREATININE 0.7 09/05/2016 1004   CALCIUM 10.0 09/05/2016 1004   GFRNONAA >60 07/24/2016 0408   GFRAA >60 07/24/2016 0408    BNP    Component Value Date/Time   BNP 35.2 03/19/2016 1140    ProBNP    Component Value Date/Time   PROBNP 49.2 09/14/2014 1246    Imaging: No results found.   Assessment & Plan:   COPD with emphysema Gold D Progressive GOLD D/IV COPD with recurrent exacerbations -suspect gradual weight loss is from pulmonary decline Steroid trial -taper to 10mg  daily .   Plan  Patient Instructions  Prednisone taper as directed to 10mg  daily and hold at this dose.  Continue on BREO 1 puff daily , rinse after use.   Continue on Albuterol Neb Four times a day  .  Continue on Pulmicort Neb Twice daily  .  Continue on Oxygen 4l/m with activity and 3l/m at rest .  Stress reducers with breathing exercises as we discussed.  Activity as tolerated.  Add ensure Twice daily  Between meals.  Follow up in 6 weeks as planned and As needed   Please contact office for sooner follow up if symptoms do not improve or worsen or seek emergency care      Chronic respiratory failure ([place]) Cont on O2 to keep sats ~90%.   Protein-calorie malnutrition, severe Advised to restart ensure Twice daily  B/t meals.  Low steroids may help with this as well.  If persists can consider megace      Rexene Edison, NP 10/09/2016

## 2016-10-09 NOTE — Assessment & Plan Note (Signed)
Cont on O2 to keep sats >90%.  

## 2016-10-09 NOTE — Assessment & Plan Note (Signed)
Progressive GOLD D/IV COPD with recurrent exacerbations -suspect gradual weight loss is from pulmonary decline Steroid trial -taper to 10mg  daily .   Plan  Patient Instructions  Prednisone taper as directed to 10mg  daily and hold at this dose.  Continue on BREO 1 puff daily , rinse after use.   Continue on Albuterol Neb Four times a day  .  Continue on Pulmicort Neb Twice daily  .  Continue on Oxygen 4l/m with activity and 3l/m at rest .  Stress reducers with breathing exercises as we discussed.  Activity as tolerated.  Add ensure Twice daily  Between meals.  Follow up in 6 weeks as planned and As needed   Please contact office for sooner follow up if symptoms do not improve or worsen or seek emergency care

## 2016-10-09 NOTE — Addendum Note (Signed)
Addended by: Len Blalock on: 10/09/2016 11:31 AM   Modules accepted: Orders

## 2016-10-09 NOTE — Patient Instructions (Addendum)
Prednisone taper as directed to 10mg  daily and hold at this dose.  Continue on BREO 1 puff daily , rinse after use.   Continue on Albuterol Neb Four times a day  .  Continue on Pulmicort Neb Twice daily  .  Continue on Oxygen 4l/m with activity and 3l/m at rest .  Stress reducers with breathing exercises as we discussed.  Activity as tolerated.  Add ensure Twice daily  Between meals.  Follow up in 6 weeks as planned and As needed   Please contact office for sooner follow up if symptoms do not improve or worsen or seek emergency care

## 2016-10-09 NOTE — Telephone Encounter (Signed)
I note that she has an appointment with TP today

## 2016-10-09 NOTE — Assessment & Plan Note (Signed)
Advised to restart ensure Twice daily  B/t meals.  Low steroids may help with this as well.  If persists can consider megace

## 2016-10-10 ENCOUNTER — Other Ambulatory Visit: Payer: Medicare Other

## 2016-10-10 ENCOUNTER — Ambulatory Visit: Payer: Medicare Other | Admitting: Hematology & Oncology

## 2016-10-10 NOTE — Telephone Encounter (Signed)
Spoke with pt. She states that she saw TP yesterday in [place]. This matter was taken care of then. Nothing further was needed.

## 2016-10-13 DIAGNOSIS — I10 Essential (primary) hypertension: Secondary | ICD-10-CM | POA: Diagnosis not present

## 2016-10-13 DIAGNOSIS — J42 Unspecified chronic bronchitis: Secondary | ICD-10-CM | POA: Diagnosis not present

## 2016-10-13 DIAGNOSIS — E785 Hyperlipidemia, unspecified: Secondary | ICD-10-CM | POA: Diagnosis not present

## 2016-10-19 NOTE — Progress Notes (Signed)
Reviewed & agree with plan  

## 2016-10-21 ENCOUNTER — Other Ambulatory Visit (HOSPITAL_BASED_OUTPATIENT_CLINIC_OR_DEPARTMENT_OTHER): Payer: Medicare Other

## 2016-10-21 ENCOUNTER — Encounter: Payer: Self-pay | Admitting: Hematology & Oncology

## 2016-10-21 ENCOUNTER — Ambulatory Visit (HOSPITAL_BASED_OUTPATIENT_CLINIC_OR_DEPARTMENT_OTHER): Payer: Medicare Other | Admitting: Family

## 2016-10-21 VITALS — BP 151/62 | HR 77 | Temp 98.4°F | Resp 22 | Wt 98.2 lb

## 2016-10-21 DIAGNOSIS — Z86718 Personal history of other venous thrombosis and embolism: Secondary | ICD-10-CM | POA: Diagnosis not present

## 2016-10-21 DIAGNOSIS — D508 Other iron deficiency anemias: Secondary | ICD-10-CM

## 2016-10-21 DIAGNOSIS — D649 Anemia, unspecified: Secondary | ICD-10-CM

## 2016-10-21 LAB — CBC WITH DIFFERENTIAL (CANCER CENTER ONLY)
BASO#: 0 10*3/uL (ref 0.0–0.2)
BASO%: 0.2 % (ref 0.0–2.0)
EOS ABS: 0.1 10*3/uL (ref 0.0–0.5)
EOS%: 1.3 % (ref 0.0–7.0)
HCT: 38.3 % (ref 34.8–46.6)
HGB: 11 g/dL — ABNORMAL LOW (ref 11.6–15.9)
LYMPH#: 1.1 10*3/uL (ref 0.9–3.3)
LYMPH%: 17.2 % (ref 14.0–48.0)
MCH: 23.2 pg — AB (ref 26.0–34.0)
MCHC: 28.7 g/dL — AB (ref 32.0–36.0)
MCV: 81 fL (ref 81–101)
MONO#: 0.3 10*3/uL (ref 0.1–0.9)
MONO%: 4 % (ref 0.0–13.0)
NEUT#: 4.9 10*3/uL (ref 1.5–6.5)
NEUT%: 77.3 % (ref 39.6–80.0)
PLATELETS: 256 10*3/uL (ref 145–400)
RBC: 4.74 10*6/uL (ref 3.70–5.32)
RDW: 15.5 % (ref 11.1–15.7)
WBC: 6.3 10*3/uL (ref 3.9–10.0)

## 2016-10-21 LAB — CMP (CANCER CENTER ONLY)
ALT(SGPT): 27 U/L (ref 10–47)
AST: 49 U/L — ABNORMAL HIGH (ref 11–38)
Albumin: 4 g/dL (ref 3.3–5.5)
Alkaline Phosphatase: 58 U/L (ref 26–84)
BUN: 9 mg/dL (ref 7–22)
CHLORIDE: 92 meq/L — AB (ref 98–108)
CO2: 33 meq/L (ref 18–33)
CREATININE: 0.7 mg/dL (ref 0.6–1.2)
Calcium: 10.7 mg/dL — ABNORMAL HIGH (ref 8.0–10.3)
Glucose, Bld: 147 mg/dL — ABNORMAL HIGH (ref 73–118)
Potassium: 4 mEq/L (ref 3.3–4.7)
SODIUM: 143 meq/L (ref 128–145)
TOTAL PROTEIN: 7.1 g/dL (ref 6.4–8.1)
Total Bilirubin: 0.7 mg/dl (ref 0.20–1.60)

## 2016-10-21 LAB — FERRITIN: FERRITIN: 327 ng/mL — AB (ref 9–269)

## 2016-10-21 LAB — IRON AND TIBC
%SAT: 38 % (ref 21–57)
IRON: 121 ug/dL (ref 41–142)
TIBC: 314 ug/dL (ref 236–444)
UIBC: 193 ug/dL (ref 120–384)

## 2016-10-21 NOTE — Progress Notes (Signed)
Hematology and Oncology Follow Up Visit  Holly Page [id number] 12/22/1935 81 y.o. 10/21/2016   Principle Diagnosis:  Thromboembolic disease of the right thigh Superficial thrombus of the left arm   Current Therapy:   Observation     Interim History:  Holly Page is here today for follow-up. She is doing well but still dealing with pulmonary issues. She states that her SOB is the same. She is still on 3L supplemental O2 24 hours a day. She is hoping to get a lighter portable oxygen tank soon. The tank she has now is quite heavy and hard for her to carry around.  Her counts have improved. Hgb is now 11.0 with an MCV of 81. We will have her start taking OTC folic acid daily.  No fatigue, fever, chills, n/v, cough, rash, dizziness, chest pain, palpitations, abdomina pain or changes in bowel or bladder habits.  No swelling, tenderness, numbness or tingling in her extremities. She has some intermittent left shoulder pain that comes and goes.  She has maintained a good appetite and is staying well hydrated. Her weight is stable.   Medications:  Allergies as of 10/21/2016      Reactions   Aspirin Other (See Comments)   Other reaction(s): PALPITATIONS Other reaction(s): DIFFICULTY BREATHING Heart flutter   Incruse Ellipta [umeclidinium Bromide] Shortness Of Breath   Tramadol Shortness Of Breath   Amlodipine Other (See Comments)   Other reaction(s): SWELLING   Codeine Other (See Comments)   Hallucinations, can take Tylenol #3 now   Doxycycline Other (See Comments)   Other reaction(s): NAUSEA,VOMITING   Gabapentin Other (See Comments)   Other reaction(s): Other (See Comments) She could not swallow the large pill   Hydrocodone Other (See Comments)   hallucinations   Losartan Other (See Comments)   unknown   Propoxyphene Nausea Only   Tiotropium Itching, Rash      Medication List       Accurate as of 10/21/16 12:45 PM. Always use your most recent med list.            acetaminophen-codeine 300-30 MG tablet Commonly known as:  TYLENOL #3 Take 1-2 tablets by mouth every 8 (eight) hours as needed for moderate pain or severe pain.   albuterol (2.5 MG/3ML) 0.083% nebulizer solution Commonly known as:  PROVENTIL TAKE 3 ML'S BY NEBULIZATION EVERY 6 HOURS AS NEEDED FOR SHORTNESS OF BREATH AND AS NEEDED   albuterol 108 (90 Base) MCG/ACT inhaler Commonly known as:  PROVENTIL HFA;VENTOLIN HFA Inhale 2 puffs into the lungs every 6 (six) hours as needed for wheezing or shortness of breath.   ALPRAZolam 0.25 MG tablet Commonly known as:  XANAX Take 1/2 - 1 tablet by mouth daily as needed   cholecalciferol 1000 units tablet Commonly known as:  VITAMIN D Take 1,000 Units by mouth daily.   feeding supplement Liqd Take 1 Container by mouth 3 (three) times daily between meals.   fluticasone furoate-vilanterol 100-25 MCG/INH Aepb Commonly known as:  BREO ELLIPTA Inhale 1 puff into the lungs daily.   fluticasone furoate-vilanterol 100-25 MCG/INH Aepb Commonly known as:  BREO ELLIPTA Inhale 1 puff into the lungs daily.   IRON SLOW RELEASE 143 (45 Fe) MG Tbcr Generic drug:  Ferrous Sulfate Take 143 mg by mouth daily.   MULTIVITAMIN & MINERAL PO Take 1 tablet by mouth daily.   OXYGEN Inhale 3 L/hr into the lungs.   potassium chloride SA 20 MEQ tablet Commonly known as:  K-DUR,KLOR-CON Take 1 tablet (  20 mEq total) by mouth daily.   predniSONE 10 MG tablet Commonly known as:  DELTASONE 4 tabs for 3 days, then 3 tabs for 3 days, 2 tabs for 3 days, then 1 tab daily.   ranitidine 300 MG tablet Commonly known as:  ZANTAC Take 1 tablet (300 mg total) by mouth at bedtime. 1 tablet at bedtime   sodium chloride 0.65 % Soln nasal spray Commonly known as:  OCEAN Place 2 sprays into both nostrils as needed for congestion. Reported on 12/31/2015   solifenacin 5 MG tablet Commonly known as:  VESICARE Take 5 mg by mouth daily as needed (for bladder).    verapamil 240 MG CR tablet Commonly known as:  CALAN-SR Take 2 tablets (480 mg total) by mouth daily.       Allergies:  Allergies  Allergen Reactions  . Aspirin Other (See Comments)    Other reaction(s): PALPITATIONS Other reaction(s): DIFFICULTY BREATHING Heart flutter  . Incruse Ellipta [Umeclidinium Bromide] Shortness Of Breath  . Tramadol Shortness Of Breath  . Amlodipine Other (See Comments)    Other reaction(s): SWELLING  . Codeine Other (See Comments)    Hallucinations, can take Tylenol #3 now  . Doxycycline Other (See Comments)    Other reaction(s): NAUSEA,VOMITING  . Gabapentin Other (See Comments)    Other reaction(s): Other (See Comments) She could not swallow the large pill  . Hydrocodone Other (See Comments)    hallucinations  . Losartan Other (See Comments)    unknown  . Propoxyphene Nausea Only  . Tiotropium Itching and Rash    Past Medical History, Surgical history, Social history, and Family History were reviewed and updated.  Review of Systems: All other 10 point review of systems is negative.   Physical Exam:  weight is 98 lb 4 oz (44.6 kg). Her oral temperature is 98.4 F (36.9 C). Her blood pressure is 151/62 (abnormal) and her pulse is 77. Her respiration is 22 (abnormal) and oxygen saturation is 95%.   Wt Readings from Last 3 Encounters:  10/21/16 98 lb 4 oz (44.6 kg)  10/09/16 99 lb (44.9 kg)  08/14/16 103 lb (46.7 kg)    Ocular: Sclerae unicteric, pupils equal, round and reactive to light Ear-nose-throat: Oropharynx clear, dentition fair Lymphatic: No cervical supraclavicular or axillary adenopathy Lungs no rales or rhonchi, good excursion bilaterally Heart regular rate and rhythm, no murmur appreciated Abd soft, nontender, positive bowel sounds, no liver or spleen tip palpated on exam MSK no focal spinal tenderness, no joint edema Neuro: non-focal, well-oriented, appropriate affect Breasts: Deferred  Lab Results  Component Value  Date   WBC 6.3 10/21/2016   HGB 11.0 (L) 10/21/2016   HCT 38.3 10/21/2016   MCV 81 10/21/2016   PLT 256 10/21/2016   Lab Results  Component Value Date   FERRITIN 318 (H) 09/05/2016   IRON 110 09/05/2016   TIBC 329 09/05/2016   UIBC 218 09/05/2016   IRONPCTSAT 34 09/05/2016   Lab Results  Component Value Date   RBC 4.74 10/21/2016   No results found for: KPAFRELGTCHN, LAMBDASER, KAPLAMBRATIO No results found for: IGGSERUM, IGA, IGMSERUM No results found for: Ronnald Ramp, A1GS, A2GS, Violet Baldy, MSPIKE, SPEI   Chemistry      Component Value Date/Time   NA 143 10/21/2016 1117   NA 145 09/05/2016 1004   K 4.0 10/21/2016 1117   K 3.8 09/05/2016 1004   CL 92 (L) 10/21/2016 1117   CO2 33 10/21/2016 1117   CO2 37 (  H) 09/05/2016 1004   BUN 9 10/21/2016 1117   BUN 11.7 09/05/2016 1004   CREATININE 0.7 10/21/2016 1117   CREATININE 0.7 09/05/2016 1004      Component Value Date/Time   CALCIUM 10.7 (H) 10/21/2016 1117   CALCIUM 10.0 09/05/2016 1004   ALKPHOS 58 10/21/2016 1117   ALKPHOS 73 09/05/2016 1004   AST 49 (H) 10/21/2016 1117   AST 49 (H) 09/05/2016 1004   ALT 27 10/21/2016 1117   ALT 18 09/05/2016 1004   BILITOT 0.70 10/21/2016 1117   BILITOT 0.39 09/05/2016 1004     Impression and Plan: Ms. Riniker is 81 yo African American female with history of multiple DVT's and a superficial blood clot in her left arm. She has done well off of Xarelto and so far there has been no evidence of recurrent thrombus. Her biggest problem at this time appears to be her pulmonary function.  Her Hgb is improved at 11.0 with an MCV of 81. She will try adding daily folic acid and see if this helps.  Her last iron studies looked good. Today's levels are pending.  We will plan to see her back in 1 month for repeat lab work and follow-up. She will contact our office with any questions or concerns. We can certainly see her sooner if need be.   Eliezer Bottom,  NP 1/23/201812:45 PM

## 2016-10-22 ENCOUNTER — Telehealth: Payer: Self-pay | Admitting: *Deleted

## 2016-10-22 LAB — ERYTHROPOIETIN: Erythropoietin: 11.9 m[IU]/mL (ref 2.6–18.5)

## 2016-10-22 LAB — RETICULOCYTES: RETICULOCYTE COUNT: 0.6 % (ref 0.6–2.6)

## 2016-10-22 NOTE — Telephone Encounter (Addendum)
Patient aware of results  ----- Message from Eliezer Bottom, NP sent at 10/22/2016  8:48 AM EST ----- Regarding: Iron Iron studies look good. No infusion needed!  [name]  ----- Message ----- From: Volanda Napoleon, MD Sent: 10/21/2016   2:00 PM To: Eliezer Bottom, NP    ----- Message ----- From: Interface, Lab In Three Zero One Sent: 10/21/2016  11:26 AM To: Volanda Napoleon, MD

## 2016-10-26 DIAGNOSIS — J449 Chronic obstructive pulmonary disease, unspecified: Secondary | ICD-10-CM | POA: Diagnosis not present

## 2016-10-27 ENCOUNTER — Telehealth: Payer: Self-pay | Admitting: Family Medicine

## 2016-10-27 NOTE — Telephone Encounter (Signed)
Patient is asking to transfer [facility] [place] [facility] Please advise

## 2016-10-27 NOTE — Telephone Encounter (Signed)
OK. Out of curiosity, is there a particular reason? I don't see that I've seen her before?

## 2016-10-27 NOTE — Telephone Encounter (Signed)
Fine with me

## 2016-10-29 DIAGNOSIS — N200 Calculus of kidney: Secondary | ICD-10-CM | POA: Diagnosis not present

## 2016-10-29 DIAGNOSIS — R350 Frequency of micturition: Secondary | ICD-10-CM | POA: Diagnosis not present

## 2016-10-30 ENCOUNTER — Ambulatory Visit (INDEPENDENT_AMBULATORY_CARE_PROVIDER_SITE_OTHER): Payer: Medicare Other | Admitting: Family Medicine

## 2016-10-30 ENCOUNTER — Encounter: Payer: Self-pay | Admitting: Family Medicine

## 2016-10-30 VITALS — BP 130/57 | HR 73 | Temp 98.5°F | Ht 65.0 in | Wt 100.8 lb

## 2016-10-30 DIAGNOSIS — I10 Essential (primary) hypertension: Secondary | ICD-10-CM | POA: Diagnosis not present

## 2016-10-30 NOTE — Patient Instructions (Signed)
Around 4 times per week, check your blood pressure 4 times per day. Twice in the morning and twice in the evening. The readings should be at least one minute apart. Write down these values and bring them to your next nurse visit/appointment. I want to know which days you are taking 2 pills and which days you are taking 1 pill on your log.

## 2016-10-30 NOTE — Progress Notes (Signed)
Chief Complaint  Patient presents with  . Transitions Of Care    Subjective: Patient is a 81 y.o. female here for transitioning care.  Wanted to discuss HTN. Takes verapamil 240 mg and 120 mg on alternating days. No issues with medications. Does have a wrist cuff at home, gets around 130's over 60-70's on average. She is not exercising routinely. Diet is overall healthy.   ROS: Heart: Denies chest pain  Family History  Problem Relation Age of Onset  . COPD Father     smoker deceased.   . Lupus Sister   . Heart attack Son    Past Medical History:  Diagnosis Date  . COPD (chronic obstructive pulmonary disease) ([place])   . DVT (deep venous thrombosis) ([place]) 1980s, recurrent 2015   Right DVT 2015  on xarelto  . GERD (gastroesophageal reflux disease)   . Hypertension   . Lactose intolerance   . Meningioma ([place])    followed by Dr Christella Noa  . Pneumonia   . Spinal stenosis    Allergies  Allergen Reactions  . Aspirin Other (See Comments)    Other reaction(s): PALPITATIONS Other reaction(s): DIFFICULTY BREATHING Heart flutter  . Incruse Ellipta [Umeclidinium Bromide] Shortness Of Breath  . Tramadol Shortness Of Breath  . Amlodipine Other (See Comments)    Other reaction(s): SWELLING  . Codeine Other (See Comments)    Hallucinations, can take Tylenol #3 now  . Doxycycline Other (See Comments)    Other reaction(s): NAUSEA,VOMITING  . Gabapentin Other (See Comments)    Other reaction(s): Other (See Comments) She could not swallow the large pill  . Hydrocodone Other (See Comments)    hallucinations  . Losartan Other (See Comments)    unknown  . Propoxyphene Nausea Only  . Tiotropium Itching and Rash    Current Outpatient Prescriptions:  .  albuterol (PROVENTIL HFA;VENTOLIN HFA) 108 (90 Base) MCG/ACT inhaler, Inhale 2 puffs into the lungs every 6 (six) hours as needed for wheezing or shortness of breath., Disp: 1 Inhaler, Rfl: 5 .  albuterol (PROVENTIL) (2.5 MG/3ML) 0.083%  nebulizer solution, TAKE 3 ML'S BY NEBULIZATION EVERY 6 HOURS AS NEEDED FOR SHORTNESS OF BREATH AND AS NEEDED, Disp: 375 mL, Rfl: 11 .  cholecalciferol (VITAMIN D) 1000 UNITS tablet, Take 1,000 Units by mouth daily., Disp: , Rfl:  .  feeding supplement (BOOST / RESOURCE BREEZE) LIQD, Take 1 Container by mouth 3 (three) times daily between meals. (Patient taking differently: Take 1 Container by mouth daily. ), Disp: 21 Container, Rfl: 0 .  fluticasone furoate-vilanterol (BREO ELLIPTA) 100-25 MCG/INH AEPB, Inhale 1 puff into the lungs daily., Disp: 1 each, Rfl: 0 .  Multiple Vitamins-Minerals (MULTIVITAMIN & MINERAL PO), Take 1 tablet by mouth daily., Disp: , Rfl:  .  OXYGEN, Inhale 3 L/hr into the lungs., Disp: , Rfl:  .  potassium chloride SA (K-DUR,KLOR-CON) 20 MEQ tablet, Take 1 tablet (20 mEq total) by mouth daily., Disp: 90 tablet, Rfl: 2 .  predniSONE (DELTASONE) 10 MG tablet, 4 tabs for 3 days, then 3 tabs for 3 days, 2 tabs for 3 days, then 1 tab daily., Disp: 60 tablet, Rfl: 3 .  ranitidine (ZANTAC) 300 MG tablet, Take 1 tablet (300 mg total) by mouth at bedtime. 1 tablet at bedtime (Patient taking differently: Take 300 mg by mouth at bedtime. ), Disp: 90 tablet, Rfl: 2 .  sodium chloride (OCEAN) 0.65 % SOLN nasal spray, Place 2 sprays into both nostrils as needed for congestion. Reported on 12/31/2015, Disp: ,  Rfl:  .  solifenacin (VESICARE) 5 MG tablet, Take 5 mg by mouth daily as needed (for bladder). , Disp: , Rfl:  .  verapamil (CALAN-SR) 240 MG CR tablet, Take 2 tablets (480 mg total) by mouth daily. (Patient taking differently: Take 240-480 mg by mouth See admin instructions. Take 240 mg by mouth daily alternating every other day with 480 mg by mouth daily.), Disp: 60 tablet, Rfl: 5  Objective: BP (!) 130/57 (BP Location: Left Arm, Patient Position: Sitting, Cuff Size: Normal)   Pulse 73   Temp 98.5 F (36.9 C) (Oral)   Ht 5\' 5"  (1.651 m)   Wt 100 lb 12.8 oz (45.7 kg)   SpO2 95%  Comment: Pt on 3 liters of O2.  BMI 16.77 kg/m  General: Awake, appears stated age HEENT: Grey rim around iris b/l, PERRLA Heart: RRR, no murmurs, no bruits, distant heart sounds Lungs: CTAB, no rales, wheezes or rhonchi. No accessory muscle use, on 3 L [place] Psych: Age appropriate judgment and insight, normal affect and mood  Assessment and Plan: Essential hypertension  Keep BP meds same for now. Keep BP log, fu in 4 weeks to review this. The patient voiced understanding and agreement to the plan.  [place], DO 10/30/16  11:44 AM

## 2016-11-03 ENCOUNTER — Telehealth: Payer: Self-pay | Admitting: Adult Health

## 2016-11-03 NOTE — Telephone Encounter (Signed)
If she remains conscious sedation, that is okay with due precautions

## 2016-11-03 NOTE — Telephone Encounter (Signed)
Spoke with pt. States that she is having lithotripsy on Friday. She was told that she would be "put to sleep but not really put to sleep." Pt wants to make sure this is safe for her due to her breathing.  RA - please advise. Thanks.

## 2016-11-03 NOTE — Telephone Encounter (Signed)
[phone] pt calling back

## 2016-11-03 NOTE — Telephone Encounter (Signed)
Spoke with pt. She is aware of RA's response. Nothing further was needed. 

## 2016-11-12 ENCOUNTER — Ambulatory Visit (INDEPENDENT_AMBULATORY_CARE_PROVIDER_SITE_OTHER): Payer: Medicare Other | Admitting: Family Medicine

## 2016-11-12 ENCOUNTER — Ambulatory Visit (HOSPITAL_BASED_OUTPATIENT_CLINIC_OR_DEPARTMENT_OTHER)
Admission: RE | Admit: 2016-11-12 | Discharge: 2016-11-12 | Disposition: A | Discharge status: Home / Self Care | Payer: Medicare Other | Source: Ambulatory Visit | Attending: Family Medicine | Admitting: Family Medicine

## 2016-11-12 ENCOUNTER — Encounter: Payer: Self-pay | Admitting: Family Medicine

## 2016-11-12 VITALS — BP 104/60 | HR 82 | Temp 98.4°F | Ht 65.0 in | Wt 100.2 lb

## 2016-11-12 DIAGNOSIS — G8929 Other chronic pain: Secondary | ICD-10-CM | POA: Insufficient documentation

## 2016-11-12 DIAGNOSIS — M25512 Pain in left shoulder: Secondary | ICD-10-CM | POA: Diagnosis not present

## 2016-11-12 NOTE — Progress Notes (Signed)
Pre visit review using our clinic review tool, if applicable. No additional management support is needed unless otherwise documented below in the visit note. 

## 2016-11-12 NOTE — Progress Notes (Signed)
Musculoskeletal Exam  Patient: Holly Page DOB: 01/12/1936  DOS: 11/12/2016  SUBJECTIVE:  Chief Complaint:   Chief Complaint  Patient presents with  . Arm Pain    (L) upper-painfull more at night    Holly Page is a 81 y.o.  female for evaluation and treatment of L upper pain.   Onset:  3 months ago. Sudden, no injury or change in activity.  Location: Lateral shoulder (over deltoid) Character:  burning and throbbing She will only have it at night. Of note, she did not have a last night. She's not having any issues right now. Progression of issue:  is unchanged Associated symptoms: none Treatment: to date has been none.   Neurovascular symptoms: no  ROS: Musculoskeletal/Extremities: +shoulder pain Neurologic: no numbness, tingling no weakness   Past Medical History:  Diagnosis Date  . COPD (chronic obstructive pulmonary disease) ([place])   . DVT (deep venous thrombosis) ([place]) 1980s, recurrent 2015   Right DVT 2015  on xarelto  . GERD (gastroesophageal reflux disease)   . Hypertension   . Lactose intolerance   . Meningioma ([place])    followed by Dr Christella Noa  . Pneumonia   . Spinal stenosis    Past Surgical History:  Procedure Laterality Date  . CHOLECYSTECTOMY    . FOOT SURGERY    . TONSILLECTOMY    . TUBAL LIGATION     Family History  Problem Relation Age of Onset  . COPD Father     smoker deceased.   . Lupus Sister   . Heart attack Son    Current Outpatient Prescriptions  Medication Sig Dispense Refill  . albuterol (PROVENTIL HFA;VENTOLIN HFA) 108 (90 Base) MCG/ACT inhaler Inhale 2 puffs into the lungs every 6 (six) hours as needed for wheezing or shortness of breath. 1 Inhaler 5  . albuterol (PROVENTIL) (2.5 MG/3ML) 0.083% nebulizer solution TAKE 3 ML'S BY NEBULIZATION EVERY 6 HOURS AS NEEDED FOR SHORTNESS OF BREATH AND AS NEEDED 375 mL 11  . cholecalciferol (VITAMIN D) 1000 UNITS tablet Take 1,000 Units by mouth daily.    . feeding supplement  (BOOST / RESOURCE BREEZE) LIQD Take 1 Container by mouth 3 (three) times daily between meals. (Patient taking differently: Take 1 Container by mouth daily. ) 21 Container 0  . fluticasone furoate-vilanterol (BREO ELLIPTA) 100-25 MCG/INH AEPB Inhale 1 puff into the lungs daily. 1 each 0  . Multiple Vitamins-Minerals (MULTIVITAMIN & MINERAL PO) Take 1 tablet by mouth daily.    . OXYGEN Inhale 3 L/hr into the lungs.    . potassium chloride SA (K-DUR,KLOR-CON) 20 MEQ tablet Take 1 tablet (20 mEq total) by mouth daily. 90 tablet 2  . predniSONE (DELTASONE) 10 MG tablet 4 tabs for 3 days, then 3 tabs for 3 days, 2 tabs for 3 days, then 1 tab daily. 60 tablet 3  . ranitidine (ZANTAC) 300 MG tablet Take 1 tablet (300 mg total) by mouth at bedtime. 1 tablet at bedtime (Patient taking differently: Take 300 mg by mouth at bedtime. ) 90 tablet 2  . sodium chloride (OCEAN) 0.65 % SOLN nasal spray Place 2 sprays into both nostrils as needed for congestion. Reported on 12/31/2015    . solifenacin (VESICARE) 5 MG tablet Take 5 mg by mouth daily as needed (for bladder).     . verapamil (CALAN-SR) 240 MG CR tablet Take 2 tablets (480 mg total) by mouth daily. (Patient taking differently: Take 240-480 mg by mouth See admin instructions. Take 240 mg by  mouth daily alternating every other day with 480 mg by mouth daily.) 60 tablet 5   Allergies  Allergen Reactions  . Aspirin Other (See Comments)    Other reaction(s): PALPITATIONS Other reaction(s): DIFFICULTY BREATHING Heart flutter  . Incruse Ellipta [Umeclidinium Bromide] Shortness Of Breath  . Tramadol Shortness Of Breath  . Amlodipine Other (See Comments)    Other reaction(s): SWELLING  . Codeine Other (See Comments)    Hallucinations, can take Tylenol #3 now  . Doxycycline Other (See Comments)    Other reaction(s): NAUSEA,VOMITING  . Gabapentin Other (See Comments)    Other reaction(s): Other (See Comments) She could not swallow the large pill  .  Hydrocodone Other (See Comments)    hallucinations  . Losartan Other (See Comments)    unknown  . Propoxyphene Nausea Only  . Tiotropium Itching and Rash   Social History   Social History  . Marital status: Divorced  . Number of children: 4  . Years of education: college   Occupational History  . Retired    Social History Main Topics  . Smoking status: Former Smoker    Packs/day: 1.50    Years: 56.00    Types: Cigarettes    Quit date: 04/29/2012  . Smokeless tobacco: Never Used     Comment: quit smoking 3 years ago  . Alcohol use No     Comment: quit drinking beer 2005  . Drug use: No   Social History Narrative   Patient lives at home alone- divorced, no pets.     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 No acute distress. Cardiovascular: Brisk cap refill Thorax & Lungs: No accessory muscle use Extremities: No clubbing. No cyanosis. No edema.  Skin: Warm. Dry. No erythema. No rash.  Musculoskeletal: L shoulder.   Normal active range of motion: yes.   Normal passive range of motion: yes Tenderness to palpation: no Deformity: no Ecchymosis: no Tests positive: Neer's, Hawkins Tests negative: Cross over, [employer] over Obrien's, Obrien's, Speed's, Lift off 5/5 strength throughout Neurologic: Normal sensory function. No focal deficits noted. DTR's equal and symmetry in UE's. No clonus. Psychiatric: Normal mood. Age appropriate judgment and insight. Alert & oriented x 3.     Assessment:  Chronic left shoulder pain - Plan: DG Shoulder Left  Plan: Orders as above. Hx suggestive of impingement.  May be some narrowing of subacromial space, will await final read.  Tylenol and heat.  Will likely refer to physical therapy if no requirement of surgery. F/u prn. The patient voiced understanding and agreement to the plan.   [place], DO 11/12/16  12:07 PM

## 2016-11-13 ENCOUNTER — Other Ambulatory Visit: Payer: Self-pay

## 2016-11-13 ENCOUNTER — Telehealth: Payer: Self-pay | Admitting: Adult Health

## 2016-11-13 ENCOUNTER — Telehealth: Payer: Self-pay | Admitting: Family Medicine

## 2016-11-13 DIAGNOSIS — J449 Chronic obstructive pulmonary disease, unspecified: Secondary | ICD-10-CM | POA: Diagnosis not present

## 2016-11-13 DIAGNOSIS — J45909 Unspecified asthma, uncomplicated: Secondary | ICD-10-CM | POA: Diagnosis not present

## 2016-11-13 MED ORDER — FLUTICASONE FUROATE-VILANTEROL 100-25 MCG/INH IN AEPB: 1.0000 | INHALATION_SPRAY | Freq: Every day | RESPIRATORY_TRACT | 0 refills | Status: DC

## 2016-11-13 NOTE — Telephone Encounter (Signed)
Patient called regarding imaging results.  Please advise  Phone: [phone]

## 2016-11-13 NOTE — Telephone Encounter (Signed)
Spoke with pt. She is requesting samples of Breo 100. Advised her that we had samples but she wanted to pick them up in [place]. The phone number for our [place] office was given to the pt to see if there are any samples of Breo in [place]. Nothing further was needed.

## 2016-11-14 NOTE — Telephone Encounter (Signed)
Please advise DG shoulder results on 11/12/16. TL/CMA

## 2016-11-14 NOTE — Telephone Encounter (Signed)
I have reached out to patient to inform her of results and recommendation. Pt states she was doing better last night and will like to move forward with Pt. TL/CMA

## 2016-11-14 NOTE — Telephone Encounter (Signed)
XR is normal. Options include doing nothing or trying some physical therapy. Place order for PT with dx L shoulder pain, if she wishes to proceed. TY.

## 2016-11-18 DIAGNOSIS — N200 Calculus of kidney: Secondary | ICD-10-CM | POA: Diagnosis not present

## 2016-11-20 ENCOUNTER — Ambulatory Visit: Payer: Medicare Other | Admitting: Family

## 2016-11-20 ENCOUNTER — Other Ambulatory Visit: Payer: Medicare Other

## 2016-11-26 ENCOUNTER — Telehealth: Payer: Self-pay | Admitting: *Deleted

## 2016-11-26 DIAGNOSIS — J449 Chronic obstructive pulmonary disease, unspecified: Secondary | ICD-10-CM | POA: Diagnosis not present

## 2016-11-26 NOTE — Telephone Encounter (Signed)
Pt declines at this time due to transportation issues.

## 2016-11-27 ENCOUNTER — Encounter: Payer: Self-pay | Admitting: Family Medicine

## 2016-11-27 ENCOUNTER — Ambulatory Visit (INDEPENDENT_AMBULATORY_CARE_PROVIDER_SITE_OTHER): Payer: Medicare Other | Admitting: Family Medicine

## 2016-11-27 ENCOUNTER — Ambulatory Visit: Payer: Medicare Other | Admitting: Family Medicine

## 2016-11-27 ENCOUNTER — Ambulatory Visit: Payer: Medicare Other | Admitting: Adult Health

## 2016-11-27 VITALS — BP 101/65 | HR 78 | Temp 97.7°F | Ht 65.0 in | Wt 97.6 lb

## 2016-11-27 DIAGNOSIS — R5381 Other malaise: Secondary | ICD-10-CM | POA: Diagnosis not present

## 2016-11-27 DIAGNOSIS — R2689 Other abnormalities of gait and mobility: Secondary | ICD-10-CM | POA: Diagnosis not present

## 2016-11-27 DIAGNOSIS — R441 Visual hallucinations: Secondary | ICD-10-CM

## 2016-11-27 NOTE — Patient Instructions (Signed)
Start drinking Ensure again and eating as much as able. Stay very active.

## 2016-11-27 NOTE — Progress Notes (Signed)
Chief Complaint  Patient presents with  . Shoulder Pain    F/U. L shoulder. States that her shoulder is still bothering her.  . Other    Both legs feel as they're about to give out on her.  (Startedon on Sunday through Wednesday).    Subjective: Patient is a 81 y.o. female here for arm/leg issue.  Since Sunday, the patient has been experiencing some visual changes, and intermittent weakness of her arms and legs. All of her extremities are affected equally. She feels like her legs are about to give away, however has not fallen or experience her legs buckling. She is additionally noticed that she is having visual hallucinations. When she was outside, she thought that she saw a small man meddling with her car. When she got closer, osseous see was a hubcap. She also notes that her balance is affected and she was sometimes lean towards the right when she walks. Additionally, her hand eye coordination has worsened. Of note, she is not as active as she normally is that she is worried about the flu and has not been going out as much. She is also not eating as much. Denies fevers, numbness, tingling, current weakness, difficulty swallowing, trouble with speech, or headaches.  ROS: MSK: +L shoulder pain Neuro: As noted in HPI  Family History  Problem Relation Age of Onset  . COPD Father     smoker deceased.   . Lupus Sister   . Heart attack Son    Past Medical History:  Diagnosis Date  . COPD (chronic obstructive pulmonary disease) ([place])   . DVT (deep venous thrombosis) ([place]) 1980s, recurrent 2015   Right DVT 2015  on xarelto  . GERD (gastroesophageal reflux disease)   . Hypertension   . Lactose intolerance   . Meningioma ([place])    followed by Dr Christella Noa  . Pneumonia   . Spinal stenosis    Allergies  Allergen Reactions  . Aspirin Other (See Comments)    Other reaction(s): PALPITATIONS Other reaction(s): DIFFICULTY BREATHING Heart flutter  . Incruse Ellipta [Umeclidinium Bromide]  Shortness Of Breath  . Tramadol Shortness Of Breath  . Amlodipine Other (See Comments)    Other reaction(s): SWELLING  . Codeine Other (See Comments)    Hallucinations, can take Tylenol #3 now  . Doxycycline Other (See Comments)    Other reaction(s): NAUSEA,VOMITING  . Gabapentin Other (See Comments)    Other reaction(s): Other (See Comments) She could not swallow the large pill  . Hydrocodone Other (See Comments)    hallucinations  . Losartan Other (See Comments)    unknown  . Propoxyphene Nausea Only  . Tiotropium Itching and Rash    Current Outpatient Prescriptions:  .  acetaminophen-codeine 120-12 MG/5ML suspension, Take by mouth., Disp: , Rfl:  .  albuterol (PROVENTIL HFA;VENTOLIN HFA) 108 (90 Base) MCG/ACT inhaler, Inhale 2 puffs into the lungs every 6 (six) hours as needed for wheezing or shortness of breath., Disp: 1 Inhaler, Rfl: 5 .  albuterol (PROVENTIL) (2.5 MG/3ML) 0.083% nebulizer solution, TAKE 3 ML'S BY NEBULIZATION EVERY 6 HOURS AS NEEDED FOR SHORTNESS OF BREATH AND AS NEEDED, Disp: 375 mL, Rfl: 11 .  ALPRAZolam (XANAX) 0.25 MG tablet, Take 0.25 mg by mouth at bedtime as needed for anxiety., Disp: , Rfl:  .  cholecalciferol (VITAMIN D) 1000 UNITS tablet, Take 1,000 Units by mouth daily., Disp: , Rfl:  .  feeding supplement (BOOST / RESOURCE BREEZE) LIQD, Take 1 Container by mouth 3 (three) times daily  between meals. (Patient taking differently: Take 1 Container by mouth daily. ), Disp: 21 Container, Rfl: 0 .  fluticasone furoate-vilanterol (BREO ELLIPTA) 100-25 MCG/INH AEPB, Inhale 1 puff into the lungs daily., Disp: 2 each, Rfl: 0 .  folic acid (FOLVITE) A999333 MCG tablet, Take 400 mcg by mouth daily., Disp: , Rfl:  .  Multiple Vitamins-Minerals (MULTIVITAMIN & MINERAL PO), Take 1 tablet by mouth daily., Disp: , Rfl:  .  OXYGEN, Inhale 3 L/hr into the lungs., Disp: , Rfl:  .  potassium chloride SA (K-DUR,KLOR-CON) 20 MEQ tablet, Take 1 tablet (20 mEq total) by mouth  daily., Disp: 90 tablet, Rfl: 2 .  predniSONE (DELTASONE) 10 MG tablet, 4 tabs for 3 days, then 3 tabs for 3 days, 2 tabs for 3 days, then 1 tab daily., Disp: 60 tablet, Rfl: 3 .  ranitidine (ZANTAC) 300 MG tablet, Take 1 tablet (300 mg total) by mouth at bedtime. 1 tablet at bedtime (Patient taking differently: Take 300 mg by mouth at bedtime. ), Disp: 90 tablet, Rfl: 2 .  sodium chloride (OCEAN) 0.65 % SOLN nasal spray, Place 2 sprays into both nostrils as needed for congestion. Reported on 12/31/2015, Disp: , Rfl:  .  solifenacin (VESICARE) 5 MG tablet, Take 5 mg by mouth daily as needed (for bladder). , Disp: , Rfl:  .  verapamil (CALAN-SR) 240 MG CR tablet, Take 2 tablets (480 mg total) by mouth daily. (Patient taking differently: Take 240-480 mg by mouth See admin instructions. Take 240 mg by mouth daily alternating every other day with 480 mg by mouth daily.), Disp: 60 tablet, Rfl: 5  Objective: BP 101/65 (BP Location: Left Arm, Patient Position: Sitting, Cuff Size: Normal)   Pulse 78   Temp 97.7 F (36.5 C) (Oral)   Ht 5\' 5"  (1.651 m)   Wt 97 lb 9.6 oz (44.3 kg)   SpO2 100% Comment: O2  BMI 16.24 kg/m  General: Awake, appears stated age, cachectic appearing HEENT: MMM, EOMi, PERRLA Heart: RRR, no murmurs, no bruits Lungs: CTAB, no rales, wheezes or rhonchi. No accessory muscle use MSK: 5/5 strength throughout, Gait is largely normal though she did appear to deviate to the R on one occasion of watching her walk. Neuro: 2/4 biceps reflex b/l, 2/4 patellar reflex b/l, 1/4 calcaneal reflex b/l, no clonus, sensation intact to light touch b/l in UE's and LE's  Psych: Age appropriate judgment and insight, normal affect and mood  Assessment and Plan: Visual hallucinations - Plan: MR Brain Wo Contrast, CBC, Comprehensive metabolic panel, TSH  Balance problem - Plan: B12  Physical deconditioning  Orders as above. I want her to start eating/drinking like normal again. Try to get out of  the house as I think she is cachectic appearing as is, she has lost 3 lbs since last visit. BP OK for now.  F/u pending above or prn. The patient voiced understanding and agreement to the plan.  [place], DO 11/27/16  3:37 PM

## 2016-11-27 NOTE — Progress Notes (Signed)
Pre visit review using our clinic review tool, if applicable. No additional management support is needed unless otherwise documented below in the visit note. 

## 2016-11-28 LAB — COMPREHENSIVE METABOLIC PANEL
ALT: 13 U/L (ref 0–35)
AST: 36 U/L (ref 0–37)
Albumin: 4.1 g/dL (ref 3.5–5.2)
Alkaline Phosphatase: 55 U/L (ref 39–117)
BILIRUBIN TOTAL: 0.5 mg/dL (ref 0.2–1.2)
BUN: 11 mg/dL (ref 6–23)
CALCIUM: 9.9 mg/dL (ref 8.4–10.5)
CHLORIDE: 93 meq/L — AB (ref 96–112)
CO2: 44 meq/L — AB (ref 19–32)
Creatinine, Ser: 0.57 mg/dL (ref 0.40–1.20)
GFR: 131.76 mL/min (ref >60.00)
GLUCOSE: 91 mg/dL (ref 70–99)
POTASSIUM: 4 meq/L (ref 3.5–5.1)
Sodium: 142 mEq/L (ref 135–145)
Total Protein: 6.9 g/dL (ref 6.0–8.3)

## 2016-11-28 LAB — CBC
HEMATOCRIT: 35.1 % — AB (ref 36.0–46.0)
HEMOGLOBIN: 10.3 g/dL — AB (ref 12.0–15.0)
MCHC: 29.4 g/dL — AB (ref 30.0–36.0)
MCV: 77 fl — AB (ref 78.0–100.0)
PLATELETS: 273 10*3/uL (ref 150.0–400.0)
RBC: 4.56 Mil/uL (ref 3.87–5.11)
RDW: 15.7 % — AB (ref 11.5–15.5)
WBC: 4.9 10*3/uL (ref 4.0–10.5)

## 2016-11-28 LAB — TSH: TSH: 2.71 u[IU]/mL (ref 0.35–4.50)

## 2016-11-28 LAB — VITAMIN B12: VITAMIN B 12: 723 pg/mL (ref 211–911)

## 2016-11-29 ENCOUNTER — Ambulatory Visit (HOSPITAL_BASED_OUTPATIENT_CLINIC_OR_DEPARTMENT_OTHER)
Admission: RE | Admit: 2016-11-29 | Discharge: 2016-11-29 | Disposition: A | Discharge status: Home / Self Care | Payer: Medicare Other | Source: Ambulatory Visit | Attending: Family Medicine | Admitting: Family Medicine

## 2016-11-29 DIAGNOSIS — R441 Visual hallucinations: Secondary | ICD-10-CM | POA: Insufficient documentation

## 2016-11-29 DIAGNOSIS — D320 Benign neoplasm of cerebral meninges: Secondary | ICD-10-CM | POA: Diagnosis not present

## 2016-11-29 DIAGNOSIS — I6782 Cerebral ischemia: Secondary | ICD-10-CM | POA: Diagnosis not present

## 2016-11-29 DIAGNOSIS — R51 Headache: Secondary | ICD-10-CM | POA: Diagnosis not present

## 2016-12-03 DIAGNOSIS — N200 Calculus of kidney: Secondary | ICD-10-CM | POA: Diagnosis not present

## 2016-12-05 ENCOUNTER — Ambulatory Visit (HOSPITAL_BASED_OUTPATIENT_CLINIC_OR_DEPARTMENT_OTHER)
Admission: RE | Admit: 2016-12-05 | Discharge: 2016-12-05 | Disposition: A | Discharge status: Home / Self Care | Payer: Medicare Other | Source: Ambulatory Visit | Attending: Family Medicine | Admitting: Family Medicine

## 2016-12-05 ENCOUNTER — Encounter: Payer: Self-pay | Admitting: Family Medicine

## 2016-12-05 ENCOUNTER — Other Ambulatory Visit (HOSPITAL_BASED_OUTPATIENT_CLINIC_OR_DEPARTMENT_OTHER): Payer: Medicare Other

## 2016-12-05 ENCOUNTER — Ambulatory Visit (INDEPENDENT_AMBULATORY_CARE_PROVIDER_SITE_OTHER): Payer: Medicare Other | Admitting: Family Medicine

## 2016-12-05 ENCOUNTER — Ambulatory Visit (HOSPITAL_BASED_OUTPATIENT_CLINIC_OR_DEPARTMENT_OTHER): Payer: Medicare Other | Admitting: Family

## 2016-12-05 VITALS — BP 110/50 | HR 87 | Temp 98.6°F | Ht 65.0 in

## 2016-12-05 VITALS — BP 120/60 | HR 80 | Temp 97.8°F

## 2016-12-05 DIAGNOSIS — Z86718 Personal history of other venous thrombosis and embolism: Secondary | ICD-10-CM

## 2016-12-05 DIAGNOSIS — D508 Other iron deficiency anemias: Secondary | ICD-10-CM

## 2016-12-05 DIAGNOSIS — R5383 Other fatigue: Secondary | ICD-10-CM

## 2016-12-05 DIAGNOSIS — M25512 Pain in left shoulder: Secondary | ICD-10-CM | POA: Diagnosis not present

## 2016-12-05 DIAGNOSIS — D649 Anemia, unspecified: Secondary | ICD-10-CM | POA: Diagnosis not present

## 2016-12-05 DIAGNOSIS — M79651 Pain in right thigh: Secondary | ICD-10-CM

## 2016-12-05 DIAGNOSIS — M79622 Pain in left upper arm: Secondary | ICD-10-CM | POA: Diagnosis not present

## 2016-12-05 DIAGNOSIS — M7989 Other specified soft tissue disorders: Secondary | ICD-10-CM | POA: Diagnosis not present

## 2016-12-05 DIAGNOSIS — M79604 Pain in right leg: Secondary | ICD-10-CM | POA: Diagnosis not present

## 2016-12-05 LAB — CBC WITH DIFFERENTIAL (CANCER CENTER ONLY)
BASO#: 0 10*3/uL (ref 0.0–0.2)
BASO%: 0.2 % (ref 0.0–2.0)
EOS%: 0.2 % (ref 0.0–7.0)
Eosinophils Absolute: 0 10*3/uL (ref 0.0–0.5)
HEMATOCRIT: 38.8 % (ref 34.8–46.6)
HEMOGLOBIN: 10.7 g/dL — AB (ref 11.6–15.9)
LYMPH#: 1.1 10*3/uL (ref 0.9–3.3)
LYMPH%: 19.1 % (ref 14.0–48.0)
MCH: 23 pg — ABNORMAL LOW (ref 26.0–34.0)
MCHC: 27.6 g/dL — AB (ref 32.0–36.0)
MCV: 83 fL (ref 81–101)
MONO#: 0.5 10*3/uL (ref 0.1–0.9)
MONO%: 9.3 % (ref 0.0–13.0)
NEUT%: 71.2 % (ref 39.6–80.0)
NEUTROS ABS: 4.2 10*3/uL (ref 1.5–6.5)
Platelets: 248 10*3/uL (ref 145–400)
RBC: 4.66 10*6/uL (ref 3.70–5.32)
RDW: 15.2 % (ref 11.1–15.7)
WBC: 5.8 10*3/uL (ref 3.9–10.0)

## 2016-12-05 LAB — COMPREHENSIVE METABOLIC PANEL
ALBUMIN: 3.8 g/dL (ref 3.5–5.0)
ALK PHOS: 73 U/L (ref 40–150)
ALT: 42 U/L (ref 0–55)
AST: 67 U/L — AB (ref 5–34)
Anion Gap: 13 mEq/L — ABNORMAL HIGH (ref 3–11)
BUN: 14.3 mg/dL (ref 7.0–26.0)
CO2: 43 mEq/L — ABNORMAL HIGH (ref 22–29)
CREATININE: 0.7 mg/dL (ref 0.6–1.1)
Calcium: 10.3 mg/dL (ref 8.4–10.4)
Chloride: 90 mEq/L — ABNORMAL LOW (ref 98–109)
EGFR: >90 mL/min/{1.73_m2} (ref >90)
Glucose: 139 mg/dl (ref 70–140)
POTASSIUM: 4 meq/L (ref 3.5–5.1)
SODIUM: 145 meq/L (ref 136–145)
Total Bilirubin: 0.3 mg/dL (ref 0.20–1.20)
Total Protein: 7.1 g/dL (ref 6.4–8.3)

## 2016-12-05 NOTE — Progress Notes (Signed)
Hematology and Oncology Follow Up Visit  Holly Page [id number] June 13, 1936 81 y.o. 12/05/2016   Principle Diagnosis:  Thromboembolic disease of the right thigh Superficial thrombus of the left arm   Current Therapy:   Observation     Interim History:  Holly Page is here today for follow-up. She is feeling tired and weak. Her Hgb is stable at 10.7 with an MCV of 83.  She is still on 3- 4 L supplemental O2 24 hours a day. Her SOB is unchanged and her poor lung function continues to be her biggest problem.  She states that she had a lithotripsy in February and is still waiting to pass some of the stones.  No episodes of bleeding, bruising or petechiae. No lymphadenopathy found on exam.  No fever, chills, n/v, cough, rash, dizziness, chest pain, palpitations, abdomina pain or changes in bowel or bladder habits.  No swelling, numbness or tingling in her extremities. She still has some intermittent left shoulder pain that comes and goes.  Her appetite comes and goes and it is hard for her to fix a meal due to fatigue. She states that she stays well hydrated. She refused to be weighed today but appears to have lost a little weight.  Her son typically helps her quite a bit but she states that he has been quite ill and has had the flu twice.   Medications:  Allergies as of 12/05/2016      Reactions   Aspirin Other (See Comments)   Other reaction(s): PALPITATIONS Other reaction(s): DIFFICULTY BREATHING Heart flutter   Incruse Ellipta [umeclidinium Bromide] Shortness Of Breath   Tramadol Shortness Of Breath   Amlodipine Other (See Comments)   Other reaction(s): SWELLING   Codeine Other (See Comments)   Hallucinations, can take Tylenol #3 now   Doxycycline Other (See Comments)   Other reaction(s): NAUSEA,VOMITING   Gabapentin Other (See Comments)   Other reaction(s): Other (See Comments) She could not swallow the large pill   Hydrocodone Other (See Comments)   hallucinations   Losartan Other (See Comments)   unknown   Propoxyphene Nausea Only   Tiotropium Itching, Rash      Medication List       Accurate as of 12/05/16 10:16 AM. Always use your most recent med list.          acetaminophen-codeine 120-12 MG/5ML suspension Take by mouth.   acetaminophen-codeine 300-30 MG tablet Commonly known as:  TYLENOL #3   albuterol (2.5 MG/3ML) 0.083% nebulizer solution Commonly known as:  PROVENTIL TAKE 3 ML'S BY NEBULIZATION EVERY 6 HOURS AS NEEDED FOR SHORTNESS OF BREATH AND AS NEEDED   albuterol 108 (90 Base) MCG/ACT inhaler Commonly known as:  PROVENTIL HFA;VENTOLIN HFA Inhale 2 puffs into the lungs every 6 (six) hours as needed for wheezing or shortness of breath.   ALPRAZolam 0.25 MG tablet Commonly known as:  XANAX Take 0.25 mg by mouth at bedtime as needed for anxiety.   cholecalciferol 1000 units tablet Commonly known as:  VITAMIN D Take 1,000 Units by mouth daily.   feeding supplement Liqd Take 1 Container by mouth 3 (three) times daily between meals.   fluticasone furoate-vilanterol 100-25 MCG/INH Aepb Commonly known as:  BREO ELLIPTA Inhale 1 puff into the lungs daily.   folic acid 364 MCG tablet Commonly known as:  FOLVITE Take 400 mcg by mouth daily.   MULTIVITAMIN & MINERAL PO Take 1 tablet by mouth daily.   OXYGEN Inhale 3 L/hr into the lungs.  potassium chloride SA 20 MEQ tablet Commonly known as:  K-DUR,KLOR-CON Take 1 tablet (20 mEq total) by mouth daily.   predniSONE 10 MG tablet Commonly known as:  DELTASONE 4 tabs for 3 days, then 3 tabs for 3 days, 2 tabs for 3 days, then 1 tab daily.   ranitidine 300 MG tablet Commonly known as:  ZANTAC Take 1 tablet (300 mg total) by mouth at bedtime. 1 tablet at bedtime   sodium chloride 0.65 % Soln nasal spray Commonly known as:  OCEAN Place 2 sprays into both nostrils as needed for congestion. Reported on 12/31/2015   solifenacin 5 MG tablet Commonly known as:   VESICARE Take 5 mg by mouth daily as needed (for bladder).   verapamil 240 MG CR tablet Commonly known as:  CALAN-SR Take 2 tablets (480 mg total) by mouth daily.       Allergies:  Allergies  Allergen Reactions  . Aspirin Other (See Comments)    Other reaction(s): PALPITATIONS Other reaction(s): DIFFICULTY BREATHING Heart flutter  . Incruse Ellipta [Umeclidinium Bromide] Shortness Of Breath  . Tramadol Shortness Of Breath  . Amlodipine Other (See Comments)    Other reaction(s): SWELLING  . Codeine Other (See Comments)    Hallucinations, can take Tylenol #3 now  . Doxycycline Other (See Comments)    Other reaction(s): NAUSEA,VOMITING  . Gabapentin Other (See Comments)    Other reaction(s): Other (See Comments) She could not swallow the large pill  . Hydrocodone Other (See Comments)    hallucinations  . Losartan Other (See Comments)    unknown  . Propoxyphene Nausea Only  . Tiotropium Itching and Rash    Past Medical History, Surgical history, Social history, and Family History were reviewed and updated.  Review of Systems: All other 10 point review of systems is negative.   Physical Exam:  oral temperature is 97.8 F (36.6 C). Her blood pressure is 120/60 and her pulse is 80. Her oxygen saturation is 99%.   Wt Readings from Last 3 Encounters:  11/27/16 97 lb 9.6 oz (44.3 kg)  11/12/16 100 lb 3.2 oz (45.5 kg)  10/30/16 100 lb 12.8 oz (45.7 kg)    Ocular: Sclerae unicteric, pupils equal, round and reactive to light Ear-nose-throat: Oropharynx clear, dentition fair Lymphatic: No cervical supraclavicular or axillary adenopathy Lungs no rales or rhonchi, good excursion bilaterally Heart regular rate and rhythm, no murmur appreciated Abd soft, nontender, positive bowel sounds, no liver or spleen tip palpated on exam, no fluid wave MSK no focal spinal tenderness, no joint edema Neuro: non-focal, well-oriented, appropriate affect Breasts: Deferred  Lab Results   Component Value Date   WBC 5.8 12/05/2016   HGB 10.7 (L) 12/05/2016   HCT 38.8 12/05/2016   MCV 83 12/05/2016   PLT 248 12/05/2016   Lab Results  Component Value Date   FERRITIN 327 (H) 10/21/2016   IRON 121 10/21/2016   TIBC 314 10/21/2016   UIBC 193 10/21/2016   IRONPCTSAT 38 10/21/2016   Lab Results  Component Value Date   RBC 4.66 12/05/2016   No results found for: KPAFRELGTCHN, LAMBDASER, KAPLAMBRATIO No results found for: IGGSERUM, IGA, IGMSERUM No results found for: Odetta Pink, SPEI   Chemistry      Component Value Date/Time   NA 142 11/27/2016 1503   NA 143 10/21/2016 1117   NA 145 09/05/2016 1004   K 4.0 11/27/2016 1503   K 4.0 10/21/2016 1117   K 3.8 09/05/2016  1004   CL 93 (L) 11/27/2016 1503   CL 92 (L) 10/21/2016 1117   CO2 44 (H) 11/27/2016 1503   CO2 33 10/21/2016 1117   CO2 37 (H) 09/05/2016 1004   BUN 11 11/27/2016 1503   BUN 9 10/21/2016 1117   BUN 11.7 09/05/2016 1004   CREATININE 0.57 11/27/2016 1503   CREATININE 0.7 10/21/2016 1117   CREATININE 0.7 09/05/2016 1004      Component Value Date/Time   CALCIUM 9.9 11/27/2016 1503   CALCIUM 10.7 (H) 10/21/2016 1117   CALCIUM 10.0 09/05/2016 1004   ALKPHOS 55 11/27/2016 1503   ALKPHOS 58 10/21/2016 1117   ALKPHOS 73 09/05/2016 1004   AST 36 11/27/2016 1503   AST 49 (H) 10/21/2016 1117   AST 49 (H) 09/05/2016 1004   ALT 13 11/27/2016 1503   ALT 27 10/21/2016 1117   ALT 18 09/05/2016 1004   BILITOT 0.5 11/27/2016 1503   BILITOT 0.70 10/21/2016 1117   BILITOT 0.39 09/05/2016 1004     Impression and Plan: Holly Page is 81 yo African American female with history of multiple DVT's and a superficial blood clot in her left arm. She has done well off of Xarelto and so far there has been no evidence of recurrent thrombus. Of note: she is allergic to aspirin.  Her biggest problem at this time continues to be her poor pulmonary function. She is  followed by pulmonology regularly.  We will continue to follow along with her and plan to see her back in 3 months for repeat lab work and follow-up.  Both she and her son know to contact our office with any questions or concerns. We can certainly see her sooner if need be.   Eliezer Bottom, NP 3/9/201810:16 AM

## 2016-12-05 NOTE — Progress Notes (Signed)
Chief Complaint  Patient presents with  . Knot on (R) thigh    along with bruising-noticed in 1 week    Subjective: Patient is a 81 y.o. female here for R thigh pain.  Around 1 week ago, the patient had her thigh on the table at home. She continues to have pain and states that she noticed some bruising and a mass. She does have a history of several DVTs. She is not currently on any blood thinners. She does follow with heme. She states that her previous clots presented with a superficial sensation following an injury. She is not having any shortness of breath. She is on oxygen chronically. She continues to have upper arm pain as well. She would like to make sure she does not have any clots today.  ROS: MSK: As noted in HPI Lungs: Denies SOB  Family History  Problem Relation Age of Onset  . COPD Father     smoker deceased.   . Lupus Sister   . Heart attack Son    Past Medical History:  Diagnosis Date  . COPD (chronic obstructive pulmonary disease) ([place])   . DVT (deep venous thrombosis) ([place]) 1980s, recurrent 2015   Right DVT 2015  on xarelto  . GERD (gastroesophageal reflux disease)   . Hypertension   . Lactose intolerance   . Meningioma ([place])    followed by Dr Christella Noa  . Pneumonia   . Spinal stenosis    Allergies  Allergen Reactions  . Aspirin Other (See Comments)    Other reaction(s): PALPITATIONS Other reaction(s): DIFFICULTY BREATHING Heart flutter  . Incruse Ellipta [Umeclidinium Bromide] Shortness Of Breath  . Tramadol Shortness Of Breath  . Amlodipine Other (See Comments)    Other reaction(s): SWELLING  . Codeine Other (See Comments)    Hallucinations, can take Tylenol #3 now  . Doxycycline Other (See Comments)    Other reaction(s): NAUSEA,VOMITING  . Gabapentin Other (See Comments)    Other reaction(s): Other (See Comments) She could not swallow the large pill  . Hydrocodone Other (See Comments)    hallucinations  . Losartan Other (See Comments)    unknown   . Propoxyphene Nausea Only  . Tiotropium Itching and Rash    Current Outpatient Prescriptions:  .  acetaminophen-codeine (TYLENOL #3) 300-30 MG tablet, , Disp: , Rfl:  .  acetaminophen-codeine 120-12 MG/5ML suspension, Take by mouth., Disp: , Rfl:  .  albuterol (PROVENTIL HFA;VENTOLIN HFA) 108 (90 Base) MCG/ACT inhaler, Inhale 2 puffs into the lungs every 6 (six) hours as needed for wheezing or shortness of breath., Disp: 1 Inhaler, Rfl: 5 .  albuterol (PROVENTIL) (2.5 MG/3ML) 0.083% nebulizer solution, TAKE 3 ML'S BY NEBULIZATION EVERY 6 HOURS AS NEEDED FOR SHORTNESS OF BREATH AND AS NEEDED, Disp: 375 mL, Rfl: 11 .  ALPRAZolam (XANAX) 0.25 MG tablet, Take 0.25 mg by mouth at bedtime as needed for anxiety., Disp: , Rfl:  .  cholecalciferol (VITAMIN D) 1000 UNITS tablet, Take 1,000 Units by mouth daily., Disp: , Rfl:  .  feeding supplement (BOOST / RESOURCE BREEZE) LIQD, Take 1 Container by mouth 3 (three) times daily between meals. (Patient taking differently: Take 1 Container by mouth daily. ), Disp: 21 Container, Rfl: 0 .  fluticasone furoate-vilanterol (BREO ELLIPTA) 100-25 MCG/INH AEPB, Inhale 1 puff into the lungs daily., Disp: 2 each, Rfl: 0 .  folic acid (FOLVITE) 329 MCG tablet, Take 400 mcg by mouth daily., Disp: , Rfl:  .  Multiple Vitamins-Minerals (MULTIVITAMIN & MINERAL PO), Take  1 tablet by mouth daily., Disp: , Rfl:  .  OXYGEN, Inhale 3 L/hr into the lungs., Disp: , Rfl:  .  potassium chloride SA (K-DUR,KLOR-CON) 20 MEQ tablet, Take 1 tablet (20 mEq total) by mouth daily., Disp: 90 tablet, Rfl: 2 .  predniSONE (DELTASONE) 10 MG tablet, 4 tabs for 3 days, then 3 tabs for 3 days, 2 tabs for 3 days, then 1 tab daily., Disp: 60 tablet, Rfl: 3 .  ranitidine (ZANTAC) 300 MG tablet, Take 1 tablet (300 mg total) by mouth at bedtime. 1 tablet at bedtime (Patient taking differently: Take 300 mg by mouth at bedtime. ), Disp: 90 tablet, Rfl: 2 .  sodium chloride (OCEAN) 0.65 % SOLN nasal  spray, Place 2 sprays into both nostrils as needed for congestion. Reported on 12/31/2015, Disp: , Rfl:  .  solifenacin (VESICARE) 5 MG tablet, Take 5 mg by mouth daily as needed (for bladder). , Disp: , Rfl:  .  verapamil (CALAN-SR) 240 MG CR tablet, Take 2 tablets (480 mg total) by mouth daily. (Patient taking differently: Take 240-480 mg by mouth See admin instructions. Take 240 mg by mouth daily alternating every other day with 480 mg by mouth daily.), Disp: 60 tablet, Rfl: 5  Objective: BP (!) 110/50 (BP Location: Left Arm, Patient Position: Sitting, Cuff Size: Normal)   Pulse 87   Temp 98.6 F (37 C) (Oral)   Ht 5\' 5"  (1.651 m)   SpO2 91%  General: Awake, appears stated age Heart: RRR, no murmurs Lungs: CTAB, no rales, wheezes or rhonchi. No accessory muscle use MSK: +TTP over distal antero-lateral R thigh, I do not appreciate any swelling or mass, no erythema, +TTP over distal medial bicep, no swelling or ropey sensation appreciated. Psych: Age appropriate judgment and insight, normal affect and mood  Assessment and Plan: Left upper arm pain - Plan: US Venous Img Upper Uni Left  Acute pain of right thigh - Plan: US Venous Img Lower Unilateral Right  History of DVT (deep vein thrombosis)  Orders as above. I do not have a high suspicion of DVT, but given her hx, will r/o. O2 sats appear near baseline. No tachycardia or other suggestion of PE. F/u pending above. If she is still having issues, will offer PT vs referral to Sports Medicine for second opinion. The patient voiced understanding and agreement to the plan.  [place], DO 12/05/16  12:13 PM

## 2016-12-05 NOTE — Progress Notes (Signed)
Pre visit review using our clinic review tool, if applicable. No additional management support is needed unless otherwise documented below in the visit note. 

## 2016-12-10 DIAGNOSIS — J9692 Respiratory failure, unspecified with hypercapnia: Secondary | ICD-10-CM | POA: Diagnosis not present

## 2016-12-10 DIAGNOSIS — I2699 Other pulmonary embolism without acute cor pulmonale: Secondary | ICD-10-CM | POA: Diagnosis not present

## 2016-12-10 DIAGNOSIS — I10 Essential (primary) hypertension: Secondary | ICD-10-CM | POA: Diagnosis not present

## 2016-12-10 DIAGNOSIS — I2510 Atherosclerotic heart disease of native coronary artery without angina pectoris: Secondary | ICD-10-CM | POA: Diagnosis not present

## 2016-12-10 DIAGNOSIS — J9601 Acute respiratory failure with hypoxia: Secondary | ICD-10-CM | POA: Diagnosis not present

## 2016-12-10 DIAGNOSIS — J9602 Acute respiratory failure with hypercapnia: Secondary | ICD-10-CM | POA: Diagnosis not present

## 2016-12-10 DIAGNOSIS — R000 Tachycardia, unspecified: Secondary | ICD-10-CM | POA: Diagnosis not present

## 2016-12-10 DIAGNOSIS — R2681 Unsteadiness on feet: Secondary | ICD-10-CM | POA: Diagnosis not present

## 2016-12-10 DIAGNOSIS — A4189 Other specified sepsis: Secondary | ICD-10-CM | POA: Diagnosis not present

## 2016-12-10 DIAGNOSIS — R6520 Severe sepsis without septic shock: Secondary | ICD-10-CM | POA: Diagnosis not present

## 2016-12-10 DIAGNOSIS — J441 Chronic obstructive pulmonary disease with (acute) exacerbation: Secondary | ICD-10-CM | POA: Diagnosis not present

## 2016-12-10 DIAGNOSIS — E1165 Type 2 diabetes mellitus with hyperglycemia: Secondary | ICD-10-CM | POA: Diagnosis not present

## 2016-12-10 DIAGNOSIS — Z780 Asymptomatic menopausal state: Secondary | ICD-10-CM | POA: Diagnosis not present

## 2016-12-10 DIAGNOSIS — R069 Unspecified abnormalities of breathing: Secondary | ICD-10-CM | POA: Diagnosis not present

## 2016-12-10 DIAGNOSIS — R488 Other symbolic dysfunctions: Secondary | ICD-10-CM | POA: Diagnosis not present

## 2016-12-10 DIAGNOSIS — I495 Sick sinus syndrome: Secondary | ICD-10-CM | POA: Diagnosis not present

## 2016-12-10 DIAGNOSIS — I5189 Other ill-defined heart diseases: Secondary | ICD-10-CM | POA: Diagnosis not present

## 2016-12-10 DIAGNOSIS — R5381 Other malaise: Secondary | ICD-10-CM | POA: Diagnosis not present

## 2016-12-10 DIAGNOSIS — Z87891 Personal history of nicotine dependence: Secondary | ICD-10-CM | POA: Diagnosis not present

## 2016-12-10 DIAGNOSIS — J9600 Acute respiratory failure, unspecified whether with hypoxia or hypercapnia: Secondary | ICD-10-CM | POA: Diagnosis not present

## 2016-12-10 DIAGNOSIS — J440 Chronic obstructive pulmonary disease with acute lower respiratory infection: Secondary | ICD-10-CM | POA: Diagnosis not present

## 2016-12-10 DIAGNOSIS — J181 Lobar pneumonia, unspecified organism: Secondary | ICD-10-CM | POA: Diagnosis not present

## 2016-12-10 DIAGNOSIS — J189 Pneumonia, unspecified organism: Secondary | ICD-10-CM | POA: Diagnosis not present

## 2016-12-10 DIAGNOSIS — Z86718 Personal history of other venous thrombosis and embolism: Secondary | ICD-10-CM | POA: Diagnosis not present

## 2016-12-10 DIAGNOSIS — R0602 Shortness of breath: Secondary | ICD-10-CM | POA: Diagnosis not present

## 2016-12-10 DIAGNOSIS — J9611 Chronic respiratory failure with hypoxia: Secondary | ICD-10-CM | POA: Diagnosis not present

## 2016-12-10 DIAGNOSIS — M6281 Muscle weakness (generalized): Secondary | ICD-10-CM | POA: Diagnosis not present

## 2016-12-10 DIAGNOSIS — R197 Diarrhea, unspecified: Secondary | ICD-10-CM | POA: Diagnosis not present

## 2016-12-10 DIAGNOSIS — J9622 Acute and chronic respiratory failure with hypercapnia: Secondary | ICD-10-CM | POA: Diagnosis not present

## 2016-12-10 DIAGNOSIS — E870 Hyperosmolality and hypernatremia: Secondary | ICD-10-CM | POA: Diagnosis not present

## 2016-12-10 DIAGNOSIS — R339 Retention of urine, unspecified: Secondary | ICD-10-CM | POA: Diagnosis not present

## 2016-12-10 DIAGNOSIS — I82622 Acute embolism and thrombosis of deep veins of left upper extremity: Secondary | ICD-10-CM | POA: Diagnosis not present

## 2016-12-10 DIAGNOSIS — K219 Gastro-esophageal reflux disease without esophagitis: Secondary | ICD-10-CM | POA: Diagnosis not present

## 2016-12-10 DIAGNOSIS — E873 Alkalosis: Secondary | ICD-10-CM | POA: Diagnosis not present

## 2016-12-10 DIAGNOSIS — I2609 Other pulmonary embolism with acute cor pulmonale: Secondary | ICD-10-CM | POA: Diagnosis not present

## 2016-12-10 DIAGNOSIS — J9621 Acute and chronic respiratory failure with hypoxia: Secondary | ICD-10-CM | POA: Diagnosis not present

## 2016-12-10 DIAGNOSIS — J9691 Respiratory failure, unspecified with hypoxia: Secondary | ICD-10-CM | POA: Diagnosis not present

## 2016-12-10 DIAGNOSIS — A419 Sepsis, unspecified organism: Secondary | ICD-10-CM | POA: Diagnosis not present

## 2016-12-10 DIAGNOSIS — R1312 Dysphagia, oropharyngeal phase: Secondary | ICD-10-CM | POA: Diagnosis not present

## 2016-12-10 LAB — BASIC METABOLIC PANEL
BUN: 10 mg/dL (ref 4–21)
CREATININE: 0.4 mg/dL — AB (ref 0.5–1.1)
GLUCOSE: 254 mg/dL
POTASSIUM: 3.5 mmol/L (ref 3.4–5.3)
Sodium: 146 mmol/L (ref 137–147)

## 2016-12-10 LAB — CBC AND DIFFERENTIAL
HCT: 42 % (ref 36–46)
Hemoglobin: 10.6 g/dL — AB (ref 12.0–16.0)
Platelets: 330 10*3/uL (ref 150–399)
WBC: 9.9 10*3/mL

## 2016-12-10 LAB — PROTIME-INR: PROTIME: 14.2 s — AB (ref 10.0–13.8)

## 2016-12-10 LAB — HEPATIC FUNCTION PANEL
ALT: 36 U/L — AB (ref 7–35)
AST: 73 U/L — AB (ref 13–35)
Alkaline Phosphatase: 69 U/L (ref 25–125)
Bilirubin, Total: 0.4 mg/dL

## 2016-12-10 LAB — POCT INR: INR: 1.1 (ref 0.9–1.1)

## 2016-12-11 ENCOUNTER — Ambulatory Visit: Payer: Medicare Other | Admitting: Adult Health

## 2016-12-11 ENCOUNTER — Ambulatory Visit: Payer: Medicare Other | Admitting: Family Medicine

## 2016-12-15 LAB — CBC AND DIFFERENTIAL
HCT: 36 % (ref 36–46)
Platelets: 289 10*3/uL (ref 150–399)
WBC: 7.2 10^3/mL

## 2016-12-16 LAB — CBC AND DIFFERENTIAL: Hemoglobin: 11 g/dL — AB (ref 12.0–16.0)

## 2016-12-18 LAB — BASIC METABOLIC PANEL
BUN: 14 mg/dL (ref 4–21)
CREATININE: 0.4 mg/dL — AB (ref 0.5–1.1)
POTASSIUM: 4.5 mmol/L (ref 3.4–5.3)
Sodium: 146 mmol/L (ref 137–147)

## 2016-12-19 DIAGNOSIS — R2681 Unsteadiness on feet: Secondary | ICD-10-CM | POA: Diagnosis not present

## 2016-12-19 DIAGNOSIS — J181 Lobar pneumonia, unspecified organism: Secondary | ICD-10-CM | POA: Diagnosis not present

## 2016-12-19 DIAGNOSIS — R310 Gross hematuria: Secondary | ICD-10-CM | POA: Diagnosis not present

## 2016-12-19 DIAGNOSIS — J441 Chronic obstructive pulmonary disease with (acute) exacerbation: Secondary | ICD-10-CM | POA: Diagnosis not present

## 2016-12-19 DIAGNOSIS — J189 Pneumonia, unspecified organism: Secondary | ICD-10-CM | POA: Diagnosis not present

## 2016-12-19 DIAGNOSIS — J9621 Acute and chronic respiratory failure with hypoxia: Secondary | ICD-10-CM | POA: Diagnosis not present

## 2016-12-19 DIAGNOSIS — R58 Hemorrhage, not elsewhere classified: Secondary | ICD-10-CM | POA: Diagnosis not present

## 2016-12-19 DIAGNOSIS — J9600 Acute respiratory failure, unspecified whether with hypoxia or hypercapnia: Secondary | ICD-10-CM | POA: Diagnosis not present

## 2016-12-19 DIAGNOSIS — R319 Hematuria, unspecified: Secondary | ICD-10-CM | POA: Diagnosis not present

## 2016-12-19 DIAGNOSIS — R1312 Dysphagia, oropharyngeal phase: Secondary | ICD-10-CM | POA: Diagnosis not present

## 2016-12-19 DIAGNOSIS — M6281 Muscle weakness (generalized): Secondary | ICD-10-CM | POA: Diagnosis not present

## 2016-12-19 DIAGNOSIS — J449 Chronic obstructive pulmonary disease, unspecified: Secondary | ICD-10-CM | POA: Diagnosis not present

## 2016-12-19 DIAGNOSIS — Z79899 Other long term (current) drug therapy: Secondary | ICD-10-CM | POA: Diagnosis not present

## 2016-12-19 DIAGNOSIS — I10 Essential (primary) hypertension: Secondary | ICD-10-CM | POA: Diagnosis not present

## 2016-12-19 DIAGNOSIS — R488 Other symbolic dysfunctions: Secondary | ICD-10-CM | POA: Diagnosis not present

## 2016-12-19 DIAGNOSIS — D649 Anemia, unspecified: Secondary | ICD-10-CM | POA: Diagnosis not present

## 2016-12-19 DIAGNOSIS — N200 Calculus of kidney: Secondary | ICD-10-CM | POA: Diagnosis not present

## 2016-12-19 DIAGNOSIS — I2699 Other pulmonary embolism without acute cor pulmonale: Secondary | ICD-10-CM | POA: Diagnosis not present

## 2016-12-19 DIAGNOSIS — J101 Influenza due to other identified influenza virus with other respiratory manifestations: Secondary | ICD-10-CM | POA: Diagnosis not present

## 2016-12-19 DIAGNOSIS — I2510 Atherosclerotic heart disease of native coronary artery without angina pectoris: Secondary | ICD-10-CM | POA: Diagnosis not present

## 2016-12-19 DIAGNOSIS — M79662 Pain in left lower leg: Secondary | ICD-10-CM | POA: Diagnosis not present

## 2016-12-19 DIAGNOSIS — I2609 Other pulmonary embolism with acute cor pulmonale: Secondary | ICD-10-CM | POA: Diagnosis not present

## 2016-12-19 DIAGNOSIS — R6520 Severe sepsis without septic shock: Secondary | ICD-10-CM | POA: Diagnosis not present

## 2016-12-19 DIAGNOSIS — R791 Abnormal coagulation profile: Secondary | ICD-10-CM | POA: Diagnosis not present

## 2016-12-19 DIAGNOSIS — M25552 Pain in left hip: Secondary | ICD-10-CM | POA: Diagnosis not present

## 2016-12-19 DIAGNOSIS — A419 Sepsis, unspecified organism: Secondary | ICD-10-CM | POA: Diagnosis not present

## 2016-12-19 DIAGNOSIS — Z7901 Long term (current) use of anticoagulants: Secondary | ICD-10-CM | POA: Diagnosis not present

## 2016-12-21 DIAGNOSIS — Z7901 Long term (current) use of anticoagulants: Secondary | ICD-10-CM | POA: Diagnosis not present

## 2016-12-21 DIAGNOSIS — R791 Abnormal coagulation profile: Secondary | ICD-10-CM | POA: Diagnosis not present

## 2016-12-21 DIAGNOSIS — I10 Essential (primary) hypertension: Secondary | ICD-10-CM | POA: Diagnosis not present

## 2016-12-21 DIAGNOSIS — Z79899 Other long term (current) drug therapy: Secondary | ICD-10-CM | POA: Diagnosis not present

## 2016-12-21 DIAGNOSIS — N200 Calculus of kidney: Secondary | ICD-10-CM | POA: Diagnosis not present

## 2016-12-21 DIAGNOSIS — J449 Chronic obstructive pulmonary disease, unspecified: Secondary | ICD-10-CM | POA: Diagnosis not present

## 2016-12-21 DIAGNOSIS — I2510 Atherosclerotic heart disease of native coronary artery without angina pectoris: Secondary | ICD-10-CM | POA: Diagnosis not present

## 2016-12-21 DIAGNOSIS — R310 Gross hematuria: Secondary | ICD-10-CM | POA: Diagnosis not present

## 2016-12-22 ENCOUNTER — Encounter: Payer: Self-pay | Admitting: Internal Medicine

## 2016-12-22 ENCOUNTER — Inpatient Hospital Stay: Payer: Medicare Other | Admitting: Family Medicine

## 2016-12-22 ENCOUNTER — Non-Acute Institutional Stay (SKILLED_NURSING_FACILITY): Payer: Medicare Other | Admitting: Internal Medicine

## 2016-12-22 DIAGNOSIS — I2699 Other pulmonary embolism without acute cor pulmonale: Secondary | ICD-10-CM | POA: Diagnosis not present

## 2016-12-22 DIAGNOSIS — N200 Calculus of kidney: Secondary | ICD-10-CM | POA: Insufficient documentation

## 2016-12-22 DIAGNOSIS — R6520 Severe sepsis without septic shock: Secondary | ICD-10-CM

## 2016-12-22 DIAGNOSIS — R1312 Dysphagia, oropharyngeal phase: Secondary | ICD-10-CM

## 2016-12-22 DIAGNOSIS — G629 Polyneuropathy, unspecified: Secondary | ICD-10-CM | POA: Diagnosis not present

## 2016-12-22 DIAGNOSIS — J189 Pneumonia, unspecified organism: Secondary | ICD-10-CM | POA: Diagnosis not present

## 2016-12-22 DIAGNOSIS — J9622 Acute and chronic respiratory failure with hypercapnia: Secondary | ICD-10-CM

## 2016-12-22 DIAGNOSIS — K219 Gastro-esophageal reflux disease without esophagitis: Secondary | ICD-10-CM

## 2016-12-22 DIAGNOSIS — A419 Sepsis, unspecified organism: Secondary | ICD-10-CM

## 2016-12-22 DIAGNOSIS — E872 Acidosis, unspecified: Secondary | ICD-10-CM

## 2016-12-22 DIAGNOSIS — I10 Essential (primary) hypertension: Secondary | ICD-10-CM | POA: Diagnosis not present

## 2016-12-22 DIAGNOSIS — D689 Coagulation defect, unspecified: Secondary | ICD-10-CM | POA: Insufficient documentation

## 2016-12-22 DIAGNOSIS — J441 Chronic obstructive pulmonary disease with (acute) exacerbation: Secondary | ICD-10-CM

## 2016-12-22 DIAGNOSIS — R338 Other retention of urine: Secondary | ICD-10-CM

## 2016-12-22 DIAGNOSIS — M1990 Unspecified osteoarthritis, unspecified site: Secondary | ICD-10-CM | POA: Insufficient documentation

## 2016-12-22 DIAGNOSIS — J101 Influenza due to other identified influenza virus with other respiratory manifestations: Secondary | ICD-10-CM | POA: Diagnosis not present

## 2016-12-22 DIAGNOSIS — J9621 Acute and chronic respiratory failure with hypoxia: Secondary | ICD-10-CM

## 2016-12-22 DIAGNOSIS — D219 Benign neoplasm of connective and other soft tissue, unspecified: Secondary | ICD-10-CM | POA: Insufficient documentation

## 2016-12-22 DIAGNOSIS — I2510 Atherosclerotic heart disease of native coronary artery without angina pectoris: Secondary | ICD-10-CM | POA: Insufficient documentation

## 2016-12-22 NOTE — Progress Notes (Signed)
: Provider:  Noah Delaine. Sheppard Coil, MD Location:  [facility] Room Number: 111P Place of Service:  SNF (31)  PCP: Lamar Blinks, MD Patient Care Team: Darreld Mclean, MD as PCP - General (Family Medicine) Elsie Stain, MD as Consulting Physician (Pulmonary Disease)  Extended Emergency Contact Information Primary Emergency Contact: [facility] Address: [address] [place], [place] [address] [place] Phone: [phone] Relation: Son Secondary Emergency Contact: Withers,Martin Address: [address]          [place],  [address] [place] Phone: [phone] [place] Phone: [phone] Relation: Son     Allergies: Aspirin; Incruse ellipta [umeclidinium bromide]; Tramadol; Amlodipine; Codeine; Doxycycline; Gabapentin; Hydrocodone; Losartan; Propoxyphene; and Tiotropium  Chief Complaint  Patient presents with  . New Admit To SNF    Admit to Facility    HPI: Patient is 81 y.o. female with Very severe COPD on 4 L of oxygen at home, hypertension, tachycardia, who was admitted to [facility] from 12/11/2018 third for severe sepsis due to influenza B and pneumonia. She had been having progressively worsening breathing difficulty for one week and O2 sats in the 50s. When EMS arrived she was placed on BiPAP and brought to the emergency department. Chest x-ray showed airspace consolidation consistent with pneumonia in the right lower lobe and emphysema. Her PCO2 was greater than 100. She required BiPAP for several days but was able to be weaned off by BiPAP. Continue to have rest were difficulty and a CTA was done of her chest which was positive for submassive bilateral PEs. She was treated with a heparin drip and then switched Eliquis. History of recurrent DVTs and has been off her anticoagulation and followed with rheumatology. At this time is considered she'll need to be on anticoagulation for  life as this is her fifth   episode of VTE Patient's influenza B was treated with Tamiflu and she was treated with prophylactic antibiotics. Hospital course was further complicated by acute urinary retention, now resolved and acute COPD exacerbation. Pt is admitted to SNF with generalized weakness for OT/PT. While at SNF pt will be followed for HTN, tx with verapamil and hydralazine, polyneuropathy, tx with neurontin and GERD tx with zantac.  Past Medical History:  Diagnosis Date  . Anemia   . Arthritis   . CAD (coronary artery disease)   . Clotting disorder ([place])   . COPD (chronic obstructive pulmonary disease) ([place])   . DVT (deep venous thrombosis) ([place]) 1980s, recurrent 2015   Right DVT 2015  on xarelto  . Fibroid   . GERD (gastroesophageal reflux disease)   . Hypertension   . Hypertension   . Kidney stone   . Lactose intolerance   . Meningioma ([place])    followed by Dr Christella Noa  . Pneumonia   . Spinal stenosis     Past Surgical History:  Procedure Laterality Date  . CHOLECYSTECTOMY    . FOOT SURGERY    . INCISION AND DRAINAGE FOOT    . TONSILLECTOMY AND ADENOIDECTOMY    . TREATMENT FISTULA ANAL    . TUBAL LIGATION      Allergies as of 12/22/2016      Reactions   Aspirin Other (See Comments)   Other reaction(s): PALPITATIONS Other reaction(s): DIFFICULTY BREATHING Heart flutter   Incruse Ellipta [umeclidinium Bromide] Shortness Of Breath   Tramadol Shortness Of Breath   Amlodipine Other (  See Comments)   Other reaction(s): SWELLING   Codeine Other (See Comments)   Hallucinations, can take Tylenol #3 now   Doxycycline Other (See Comments)   Other reaction(s): NAUSEA,VOMITING   Gabapentin Other (See Comments)   Other reaction(s): Other (See Comments) She could not swallow the large pill   Hydrocodone Other (See Comments)   hallucinations   Losartan Other (See Comments)   unknown   Propoxyphene Nausea Only   Tiotropium Itching, Rash      Medication List         Accurate as of 12/22/16  9:48 AM. Always use your most recent med list.          acetaminophen-codeine 300-30 MG tablet Commonly known as:  TYLENOL #3 Take 1 tablet by mouth every 4 (four) hours as needed.   Acidophilus Lactobacillus Caps Take 1 capsule by mouth daily with breakfast.   albuterol 108 (90 Base) MCG/ACT inhaler Commonly known as:  PROVENTIL HFA;VENTOLIN HFA Inhale 2 puffs into the lungs every 4 (four) hours as needed for wheezing or shortness of breath.   albuterol (2.5 MG/3ML) 0.083% nebulizer solution Commonly known as:  PROVENTIL Take 2.5 mg by nebulization every 6 (six) hours as needed for wheezing or shortness of breath.   aluminum-magnesium hydroxide-simethicone 740-814-48 MG/5ML Susp Commonly known as:  MAALOX Take 30 mLs by mouth every 4 (four) hours as needed.   apixaban 5 MG Tabs tablet Commonly known as:  ELIQUIS Take 5 mg by mouth 2 (two) times daily.   bisacodyl 5 MG EC tablet Commonly known as:  DULCOLAX Take 10 mg by mouth daily as needed.   cholecalciferol 1000 units tablet Commonly known as:  VITAMIN D Take 1,000 Units by mouth daily.   gabapentin 100 MG capsule Commonly known as:  NEURONTIN Take 200 mg by mouth daily. 2 caps   hydrALAZINE 10 MG tablet Commonly known as:  APRESOLINE Take 10 mg by mouth every 8 (eight) hours.   MULTIVITAMIN & MINERAL PO Take 1 tablet by mouth daily.   OXYGEN Inhale into the lungs.   potassium chloride 10 MEQ tablet Commonly known as:  K-DUR,KLOR-CON Take 10 mEq by mouth 2 (two) times daily.   predniSONE 10 MG tablet Commonly known as:  DELTASONE Take 10 mg by mouth daily with breakfast.   ranitidine 300 MG capsule Commonly known as:  ZANTAC Take 300 mg by mouth every evening.   sodium chloride 0.65 % Soln nasal spray Commonly known as:  OCEAN Place 1 spray into both nostrils as needed for congestion. Reported on 12/31/2015   verapamil 240 MG CR tablet Commonly known as:  CALAN-SR Take  240 mg by mouth daily.       Meds ordered this encounter  Medications  . aluminum-magnesium hydroxide-simethicone (MAALOX) 185-631-49 MG/5ML SUSP    Sig: Take 30 mLs by mouth every 4 (four) hours as needed.  [name] apixaban (ELIQUIS) 5 MG TABS tablet    Sig: Take 5 mg by mouth 2 (two) times daily.  . bisacodyl (DULCOLAX) 5 MG EC tablet    Sig: Take 10 mg by mouth daily as needed.  . hydrALAZINE (APRESOLINE) 10 MG tablet    Sig: Take 10 mg by mouth every 8 (eight) hours.  . Acidophilus Lactobacillus CAPS    Sig: Take 1 capsule by mouth daily with breakfast.  . potassium chloride (K-DUR,KLOR-CON) 10 MEQ tablet    Sig: Take 10 mEq by mouth 2 (two) times daily.  [name] albuterol (PROVENTIL HFA;VENTOLIN HFA) 108 (  90 Base) MCG/ACT inhaler    Sig: Inhale 2 puffs into the lungs every 4 (four) hours as needed for wheezing or shortness of breath.  [name] albuterol (PROVENTIL) (2.5 MG/3ML) 0.083% nebulizer solution    Sig: Take 2.5 mg by nebulization every 6 (six) hours as needed for wheezing or shortness of breath.  . verapamil (CALAN-SR) 240 MG CR tablet    Sig: Take 240 mg by mouth daily.  [name] gabapentin (NEURONTIN) 100 MG capsule    Sig: Take 200 mg by mouth daily. 2 caps  . predniSONE (DELTASONE) 10 MG tablet    Sig: Take 10 mg by mouth daily with breakfast.  . ranitidine (ZANTAC) 300 MG capsule    Sig: Take 300 mg by mouth every evening.  . cholecalciferol (VITAMIN D) 1000 units tablet    Sig: Take 1,000 Units by mouth daily.  . OXYGEN    Sig: Inhale into the lungs.    Immunization History  Administered Date(s) Administered  . Pneumococcal Conjugate-13 08/09/2015  . Pneumococcal Polysaccharide-23 09/29/2001, 09/29/2008, 04/26/2012  . Td 03/28/2009  . Tdap 04/04/2008    Social History  Substance Use Topics  . Smoking status: Former Smoker    Packs/day: 1.50    Years: 56.00    Types: Cigarettes    Quit date: 04/29/2012  . Smokeless tobacco: Never Used     Comment: quit smoking 3 years ago   . Alcohol use No     Comment: quit drinking beer 2005    Family history is   Family History  Problem Relation Age of Onset  . COPD Father     smoker deceased.   . Emphysema Father   . Lupus Sister   . Heart attack Son   . Heart disease Son   . Hypertension Son   . Hypertension Son       Review of Systems  DATA OBTAINED: from patient,  family member GENERAL:  no fevers,+ fatigue, appetite changes SKIN: No itching, or rash EYES: No eye pain, redness, discharge EARS: No earache, tinnitus, change in hearing NOSE: No congestion, drainage or bleeding  MOUTH/THROAT: No mouth or tooth pain, No sore throat RESPIRATORY: No cough, wheezing, SOB CARDIAC: No chest pain, palpitations, lower extremity edema  GI: No abdominal pain, No N/V/D or constipation, No heartburn or reflux  GU: No dysuria, frequency or urgency, or incontinence  MUSCULOSKELETAL: No unrelieved bone/joint pain NEUROLOGIC: No headache, dizziness or focal weakness PSYCHIATRIC: No c/o anxiety or sadness   Vitals:   12/22/16 0918  BP: 129/73  Pulse: 83  Resp: (!) 22  Temp: 98.4 F (36.9 C)    SpO2 Readings from Last 1 Encounters:  12/22/16 92%   Body mass index is 16.88 kg/m.     Physical Exam  GENERAL APPEARANCE: Alert, conversant,  No acute distress.  SKIN: No diaphoresis rash HEAD: Normocephalic, atraumatic  EYES: Conjunctiva/lids clear. Pupils round, reactive. EOMs intact.  EARS: External exam WNL, canals clear. Hearing grossly normal.  NOSE: No deformity or discharge.  MOUTH/THROAT: Lips w/o lesions  RESPIRATORY: Breathing is even, unlabored. Lung sounds are difusely decreased; no wheezing   CARDIOVASCULAR: Heart RRR no murmurs, rubs or gallops. No peripheral edema.   GASTROINTESTINAL: Abdomen is soft, non-tender, not distended w/ normal bowel sounds. GENITOURINARY: Bladder non tender, not distended  MUSCULOSKELETAL: No abnormal joints or musculature x thin NEUROLOGIC:  Cranial nerves 2-12  grossly intact. Moves all extremities  PSYCHIATRIC: Mood and affect appropriate to situation, no behavioral issues  Patient Active  Problem List   Diagnosis Date Noted  . Arthritis   . Clotting disorder ([place])   . CAD (coronary artery disease)   . Fibroid   . Hypertension   . Kidney stone   . Protein-calorie malnutrition, severe 07/23/2016  . Shortness of breath 07/23/2016  . SOB (shortness of breath) 07/21/2016  . Acute on chronic respiratory failure with hypoxia and hypercapnia ([place]) 07/21/2016  . Chronic respiratory failure ([place]) 04/10/2016  . COPD with exacerbation ([place]) 03/19/2016  . RBC microcytosis 03/19/2016  . COPD with acute exacerbation ([place]) 03/19/2016  . Oxygen dependent 12/31/2015  . Anxiety 11/08/2015  . Hyperthyroidism 09/07/2015  . Malnutrition of moderate degree 08/31/2015  . Abnormal LFTs 03/19/2015  . Back pain 03/19/2015  . HTN (hypertension) 03/01/2015  . Anemia 12/19/2014  . Calculus of kidney 08/30/2014  . Hematuria 08/11/2014  . History of DVT (deep vein thrombosis)   . Neuralgia neuritis, sciatic nerve 05/22/2014  . 1st degree AV block 04/24/2014  . History of colon polyps 04/24/2014  . Adaptive colitis 04/24/2014  . APC (atrial premature contractions) 04/24/2014  . Benign neoplasm of meninges ([place]) 02/10/2014  . Barrett esophagus 02/03/2014  . Spinal stenosis 12/15/2013  . Allergic rhinitis 11/21/2013  . COPD with emphysema Gold D   . GERD (gastroesophageal reflux disease)   . Lactose intolerance   . DDD (degenerative disc disease), lumbosacral 06/11/2010  . Diffuse cerebrovascular disease 06/11/2010      Labs reviewed: Basic Metabolic Panel:    Component Value Date/Time   NA 146 12/18/2016   NA 145 12/05/2016 0944   K 4.5 12/18/2016   K 4.0 12/05/2016 0944   CL 93 (L) 11/27/2016 1503   CL 92 (L) 10/21/2016 1117   CO2 43 Result Confirmed by Automated Dilution. (H) 12/05/2016 0944   GLUCOSE 139 12/05/2016 0944   GLUCOSE 147 (H)  10/21/2016 1117   BUN 14 12/18/2016   BUN 14.3 12/05/2016 0944   CREATININE 0.4 (A) 12/18/2016   CREATININE 0.7 12/05/2016 0944   CALCIUM 10.3 12/05/2016 0944   PROT 7.1 12/05/2016 0944   ALBUMIN 3.8 12/05/2016 0944   AST 73 (A) 12/10/2016   AST 67 (H) 12/05/2016 0944   ALT 36 (A) 12/10/2016   ALT 42 12/05/2016 0944   ALKPHOS 69 12/10/2016   ALKPHOS 73 12/05/2016 0944   BILITOT 0.30 12/05/2016 0944   GFRNONAA >60 07/24/2016 0408   GFRAA >60 07/24/2016 0408     Recent Labs  07/24/16 0408  10/21/16 1117 11/27/16 1503 12/05/16 0944 12/10/16 12/18/16  NA 143  < > 143 142 145 146 146  K 3.8  < > 4.0 4.0 4.0 3.5 4.5  CL 95*  --  92* 93*  --   --   --   CO2 42*  < > 33 44* 43 Result Confirmed by Automated Dilution.*  --   --   GLUCOSE 104*  < > 147* 91 139  --   --   BUN 14  < > 9 11 14.3 10 14   CREATININE 0.43*  < > 0.7 0.57 0.7 0.4* 0.4*  CALCIUM 8.9  < > 10.7* 9.9 10.3  --   --   < > = values in this interval not displayed. Liver Function Tests:  Recent Labs  10/21/16 1117 11/27/16 1503 12/05/16 0944 12/10/16  AST 49* 36 67* 73*  ALT 27 13 42 36*  ALKPHOS 58 55 73 69  BILITOT 0.70 0.5 0.30  --   PROT 7.1 6.9  7.1  --   ALBUMIN 4.0 4.1 3.8  --    No results for input(s): LIPASE, AMYLASE in the last 8760 hours. No results for input(s): AMMONIA in the last 8760 hours. CBC:  Recent Labs  09/05/16 1004 10/21/16 1117 11/27/16 1503 12/05/16 0944 12/10/16 12/15/16 12/16/16  WBC 5.6 6.3 4.9 5.8 9.9 7.2  --   NEUTROABS 2.4 4.9  --  4.2  --   --   --   HGB 10.7* 11.0* 10.3* 10.7* 10.6*  --  11.0*  HCT 36.8 38.3 35.1* 38.8 42 36  --   MCV 80* 81 77.0* 83  --   --   --   PLT 301 256 273.0 248 330 289  --    Lipid No results for input(s): CHOL, HDL, LDLCALC, TRIG in the last 8760 hours.  Cardiac Enzymes:  Recent Labs  07/21/16 1145 07/21/16 1946 07/22/16 0305  TROPONINI <0.03 0.13* <0.03   BNP:  Recent Labs  03/19/16 1140  BNP 35.2   No results found  for: [facility] Lab Results  Component Value Date   HGBA1C 5.9 (H) 08/31/2015   Lab Results  Component Value Date   TSH 2.71 11/27/2016   Lab Results  Component Value Date   VITAMINB12 723 11/27/2016   No results found for: FOLATE Lab Results  Component Value Date   IRON 121 10/21/2016   TIBC 314 10/21/2016   FERRITIN 327 (H) 10/21/2016    Imaging and Procedures obtained prior to SNF admission: US Venous Img Lower Unilateral Right  Result Date: 12/06/2016 CLINICAL DATA:  Initial evaluation for right leg pain for 2-3 months. EXAM: RIGHT LOWER EXTREMITY VENOUS DOPPLER ULTRASOUND TECHNIQUE: Gray-scale sonography with graded compression, as well as color Doppler and duplex ultrasound were performed to evaluate the lower extremity deep venous systems from the level of the common femoral vein and including the common femoral, femoral, profunda femoral, popliteal and calf veins including the posterior tibial, peroneal and gastrocnemius veins when visible. The superficial great saphenous vein was also interrogated. Spectral Doppler was utilized to evaluate flow at rest and with distal augmentation maneuvers in the common femoral, femoral and popliteal veins. COMPARISON:  None. FINDINGS: Contralateral Common Femoral Vein: Respiratory phasicity is normal and symmetric with the symptomatic side. No evidence of thrombus. Normal compressibility. Common Femoral Vein: No evidence of thrombus. Normal compressibility, respiratory phasicity and response to augmentation. Saphenofemoral Junction: No evidence of thrombus. Normal compressibility and flow on color Doppler imaging. Profunda Femoral Vein: No evidence of thrombus. Normal compressibility and flow on color Doppler imaging. Femoral Vein: No evidence of thrombus. Normal compressibility, respiratory phasicity and response to augmentation. Popliteal Vein: No evidence of thrombus. Normal compressibility, respiratory phasicity and response to augmentation.  Calf Veins: No evidence of thrombus. Normal compressibility and flow on color Doppler imaging. Superficial Great Saphenous Vein: No evidence of thrombus. Normal compressibility and flow on color Doppler imaging. Venous Reflux:  None. Other Findings:  None. IMPRESSION: No evidence of deep venous thrombosis within the right lower extremity. Electronically Signed   By: Jeannine Boga M.D.   On: 12/06/2016 07:58   US Venous Img Upper Uni Left  Result Date: 12/06/2016 CLINICAL DATA:  Initial evaluation for left elbow swelling for 6 months, left shoulder pain radiating to finger tips. History of prior superficial thrombus in 2016. EXAM: Left UPPER EXTREMITY VENOUS DOPPLER ULTRASOUND TECHNIQUE: Gray-scale sonography with graded compression, as well as color Doppler and duplex ultrasound were performed to evaluate the upper extremity deep  venous system from the level of the subclavian vein and including the jugular, axillary, basilic, radial, ulnar and upper cephalic vein. Spectral Doppler was utilized to evaluate flow at rest and with distal augmentation maneuvers. COMPARISON:  Prior radiograph from 11/12/2016. FINDINGS: Contralateral Subclavian Vein: Respiratory phasicity is normal and symmetric with the symptomatic side. No evidence of thrombus. Normal compressibility. Internal Jugular Vein: No evidence of thrombus. Normal compressibility, respiratory phasicity and response to augmentation. Subclavian Vein: No evidence of thrombus. Normal compressibility, respiratory phasicity and response to augmentation. Axillary Vein: No evidence of thrombus. Normal compressibility, respiratory phasicity and response to augmentation. Cephalic Vein: No evidence of thrombus. Normal compressibility, respiratory phasicity and response to augmentation. Basilic Vein: No evidence of thrombus. Normal compressibility, respiratory phasicity and response to augmentation. Brachial Veins: No evidence of thrombus. Normal compressibility,  respiratory phasicity and response to augmentation. Radial Veins: No evidence of thrombus. Normal compressibility, respiratory phasicity and response to augmentation. Ulnar Veins: No evidence of thrombus. Normal compressibility, respiratory phasicity and response to augmentation. Venous Reflux:  None visualized. Other Findings:  None visualized. IMPRESSION: No evidence of deep venous thrombosis within the left upper extremity. Electronically Signed   By: Jeannine Boga M.D.   On: 12/06/2016 07:56     Not all labs, radiology exams or other studies done during hospitalization come through on my EPIC note; however they are reviewed by me.    Assessment and Plan  SEVERE SEPSIS/INFLUENZA B/PNA/ COPD EXACERBATION/ ACUTE ON CHRONIC RESP FAILURE WITH HYPOXIA AND HYPERCAPNEA-resolved. Completed Tamiflu and Augmentin. Blood cultures and urine culture showed no growth. Repeat chest x-ray on 322 showed no acute cardiopulmonary process except for emphysema exacerbation resolved patient with end-stage COPD. Patient is on steroids daily and bronchodilators daily. At baseline patient uses accessory muscles for breathing. SNF - admitted with generalized weakness for OT/PT; pt chronically on 4L [place]  B SUBMASSIVE PE WITH HX VTE - this is patient's third episode of VT E. Patient was taken off anticoagulants and followed by hematology at [facility]. She'll now need lifelong anti-: Anticoagulation. SNF - 3 more doses of eliquis 10 mg BID then 5 mg BID  ACUTE URINARY RETENTION - felt 2/2 debility and ditropan;ditropan d/c until pt is healthier  OROPHARYNGEAL DYSPHAGIA - moderate; seen by ST and had MBS SNF - cont dysphagia 3 + nectar thick  CHRONIC METABOLIC ACIDOSIS - baseline  PCO2 around 65  HTN SNF - cont verapamil 240 mg daily and hydralazine 10 mg TID  POLYNEUROPATHY -  SNF - cont neurontin 200 mg daily  GERD SNF- cont zantac 300 mg daily   Time spent > 45 min;> 50% of time with patient was  spent reviewing records, labs, tests and studies, counseling and developing plan of care  Webb Silversmith D. Sheppard Coil, MD

## 2016-12-25 ENCOUNTER — Encounter: Payer: Self-pay | Admitting: Internal Medicine

## 2016-12-25 LAB — CBC AND DIFFERENTIAL
HCT: 34 % — AB (ref 36–46)
Hemoglobin: 9.6 g/dL — AB (ref 12.0–16.0)
Platelets: 245 K/µL (ref 150–399)
WBC: 7.5 10*3/mL

## 2016-12-25 LAB — BASIC METABOLIC PANEL WITH GFR
BUN: 9 mg/dL (ref 4–21)
Creatinine: 0.3 mg/dL — AB (ref 0.5–1.1)
Glucose: 105 mg/dL
Potassium: 3.8 mmol/L (ref 3.4–5.3)
Sodium: 148 mmol/L — AB (ref 137–147)

## 2016-12-26 DIAGNOSIS — I2699 Other pulmonary embolism without acute cor pulmonale: Secondary | ICD-10-CM | POA: Insufficient documentation

## 2016-12-26 DIAGNOSIS — R6520 Severe sepsis without septic shock: Principal | ICD-10-CM

## 2016-12-26 DIAGNOSIS — R1312 Dysphagia, oropharyngeal phase: Secondary | ICD-10-CM | POA: Insufficient documentation

## 2016-12-26 DIAGNOSIS — A419 Sepsis, unspecified organism: Secondary | ICD-10-CM | POA: Insufficient documentation

## 2016-12-26 DIAGNOSIS — J101 Influenza due to other identified influenza virus with other respiratory manifestations: Secondary | ICD-10-CM | POA: Insufficient documentation

## 2016-12-26 DIAGNOSIS — R338 Other retention of urine: Secondary | ICD-10-CM | POA: Insufficient documentation

## 2016-12-26 DIAGNOSIS — E872 Acidosis, unspecified: Secondary | ICD-10-CM | POA: Insufficient documentation

## 2016-12-26 DIAGNOSIS — G629 Polyneuropathy, unspecified: Secondary | ICD-10-CM | POA: Insufficient documentation

## 2016-12-29 ENCOUNTER — Other Ambulatory Visit: Payer: Self-pay

## 2016-12-29 DIAGNOSIS — I2699 Other pulmonary embolism without acute cor pulmonale: Secondary | ICD-10-CM

## 2016-12-29 DIAGNOSIS — D508 Other iron deficiency anemias: Secondary | ICD-10-CM

## 2016-12-30 ENCOUNTER — Ambulatory Visit (HOSPITAL_BASED_OUTPATIENT_CLINIC_OR_DEPARTMENT_OTHER): Payer: Medicare Other | Admitting: Hematology & Oncology

## 2016-12-30 ENCOUNTER — Other Ambulatory Visit (HOSPITAL_BASED_OUTPATIENT_CLINIC_OR_DEPARTMENT_OTHER): Payer: Medicare Other

## 2016-12-30 VITALS — BP 122/63 | HR 105 | Temp 97.9°F | Resp 22 | Wt 88.5 lb

## 2016-12-30 DIAGNOSIS — I2609 Other pulmonary embolism with acute cor pulmonale: Secondary | ICD-10-CM

## 2016-12-30 DIAGNOSIS — I2699 Other pulmonary embolism without acute cor pulmonale: Secondary | ICD-10-CM | POA: Diagnosis not present

## 2016-12-30 DIAGNOSIS — D508 Other iron deficiency anemias: Secondary | ICD-10-CM

## 2016-12-30 DIAGNOSIS — D649 Anemia, unspecified: Secondary | ICD-10-CM | POA: Diagnosis not present

## 2016-12-30 DIAGNOSIS — J449 Chronic obstructive pulmonary disease, unspecified: Secondary | ICD-10-CM

## 2016-12-30 DIAGNOSIS — J430 Unilateral pulmonary emphysema [MacLeod's syndrome]: Secondary | ICD-10-CM

## 2016-12-30 LAB — CBC WITH DIFFERENTIAL (CANCER CENTER ONLY)
BASO#: 0 10*3/uL (ref 0.0–0.2)
BASO%: 0.4 % (ref 0.0–2.0)
EOS ABS: 0.1 10*3/uL (ref 0.0–0.5)
EOS%: 0.7 % (ref 0.0–7.0)
HCT: 36.9 % (ref 34.8–46.6)
HEMOGLOBIN: 10 g/dL — AB (ref 11.6–15.9)
LYMPH#: 1.6 10*3/uL (ref 0.9–3.3)
LYMPH%: 22.2 % (ref 14.0–48.0)
MCH: 23 pg — AB (ref 26.0–34.0)
MCHC: 27.1 g/dL — ABNORMAL LOW (ref 32.0–36.0)
MCV: 85 fL (ref 81–101)
MONO#: 0.6 10*3/uL (ref 0.1–0.9)
MONO%: 8.5 % (ref 0.0–13.0)
NEUT%: 68.2 % (ref 39.6–80.0)
NEUTROS ABS: 4.8 10*3/uL (ref 1.5–6.5)
Platelets: 239 10*3/uL (ref 145–400)
RBC: 4.34 10*6/uL (ref 3.70–5.32)
RDW: 16.9 % — ABNORMAL HIGH (ref 11.1–15.7)
WBC: 7 10*3/uL (ref 3.9–10.0)

## 2016-12-30 LAB — COMPREHENSIVE METABOLIC PANEL
ALBUMIN: 3.5 g/dL (ref 3.5–5.0)
ALK PHOS: 67 U/L (ref 40–150)
ALT: 24 U/L (ref 0–55)
AST: 44 U/L — AB (ref 5–34)
Anion Gap: 12 mEq/L — ABNORMAL HIGH (ref 3–11)
BILIRUBIN TOTAL: 0.39 mg/dL (ref 0.20–1.20)
BUN: 8.2 mg/dL (ref 7.0–26.0)
CALCIUM: 10 mg/dL (ref 8.4–10.4)
CO2: 41 mEq/L — ABNORMAL HIGH (ref 22–29)
CREATININE: 0.6 mg/dL (ref 0.6–1.1)
Chloride: 95 mEq/L — ABNORMAL LOW (ref 98–109)
EGFR: >90 mL/min/{1.73_m2} (ref >90)
GLUCOSE: 172 mg/dL — AB (ref 70–140)
Potassium: 4.1 mEq/L (ref 3.5–5.1)
SODIUM: 148 meq/L — AB (ref 136–145)
TOTAL PROTEIN: 6.5 g/dL (ref 6.4–8.3)

## 2016-12-30 LAB — IRON AND TIBC
%SAT: 32 % (ref 21–57)
Iron: 84 ug/dL (ref 41–142)
TIBC: 264 ug/dL (ref 236–444)
UIBC: 180 ug/dL (ref 120–384)

## 2016-12-30 LAB — FERRITIN: Ferritin: 496 ng/ml — ABNORMAL HIGH (ref 9–269)

## 2016-12-30 NOTE — Progress Notes (Signed)
Hematology and Oncology Follow Up Visit  Holly Page [id number] 11/28/1935 81 y.o. 12/30/2016   Principle Diagnosis:  Thromboembolic disease of the right thigh Superficial thrombus of the left arm  Pulmonary Embolism - dx in 11/2016  Current Therapy:   Eliquis 5 mg po BID    Interim History:  Holly Page is here today for follow-up. Strikingly enough, she has a new pulmonary embolism. She apparently was hospitalized over at [facility]. She had a CT angiogram done on March 16. This was done because she was having some shortness of breath and chest pain. She had bilateral pulmonary embolism with evidence of right heart strain. It was felt that this was at least "submassive" PE.  She did have Dopplers of her legs on March 17. The Dopplers were negative for any thromboembolic disease.  She clearly needs lifelong anticoagulation now.  She has had some hematuria. She has bilateral nephrolithiasis. She had a CT scan of the abdomen and pelvis on March 25. This showed material within the urinary bladder. She had bilateral nephrolithiasis. She had improvement of biliary and pancreatic ductal dilatation. It is recommended that she have a cystoscopy. She does see a urologist in [facility].   She now is in a nursing home. She is at [facility]. She will be there for another 10 days.  Again I'm quite surprised that she has had this ulnar embolism. I was just not aware of this.  She has been worked for hypercoagulable conditions. Everything so far has been normal.   She is oxygen dependent. She has severe underlying COPD. Having the pulmonary emboli clearly is not helping this.   Her appetite is poor. Her weight is down.  Overall, her performance status is ECOG 3.  Medications:  Allergies as of 12/30/2016      Reactions   Aspirin Other (See Comments)   Other reaction(s): PALPITATIONS Other reaction(s): DIFFICULTY BREATHING Heart flutter   Incruse Ellipta  [umeclidinium Bromide] Shortness Of Breath   Tramadol Shortness Of Breath   Amlodipine Other (See Comments)   Other reaction(s): SWELLING   Codeine Other (See Comments)   Hallucinations, can take Tylenol #3 now   Doxycycline Other (See Comments)   Other reaction(s): NAUSEA,VOMITING   Gabapentin Other (See Comments)   Other reaction(s): Other (See Comments) She could not swallow the large pill   Hydrocodone Other (See Comments)   hallucinations   Losartan Other (See Comments)   unknown   Propoxyphene Nausea Only   Tiotropium Itching, Rash      Medication List       Accurate as of 12/30/16  8:16 AM. Always use your most recent med list.          acetaminophen-codeine 300-30 MG tablet Commonly known as:  TYLENOL #3 Take 1 tablet by mouth every 4 (four) hours as needed.   Acidophilus Lactobacillus Caps Take 1 capsule by mouth daily with breakfast.   albuterol 108 (90 Base) MCG/ACT inhaler Commonly known as:  PROVENTIL HFA;VENTOLIN HFA Inhale 2 puffs into the lungs every 4 (four) hours as needed for wheezing or shortness of breath.   albuterol (2.5 MG/3ML) 0.083% nebulizer solution Commonly known as:  PROVENTIL Take 2.5 mg by nebulization every 6 (six) hours as needed for wheezing or shortness of breath.   aluminum-magnesium hydroxide-simethicone 409-811-91 MG/5ML Susp Commonly known as:  MAALOX Take 30 mLs by mouth every 4 (four) hours as needed.   apixaban 5 MG Tabs tablet Commonly known as:  ELIQUIS Take 5 mg by mouth 2 (two) times daily.   bisacodyl 5 MG EC tablet Commonly known as:  DULCOLAX Take 10 mg by mouth daily as needed.   cholecalciferol 1000 units tablet Commonly known as:  VITAMIN D Take 1,000 Units by mouth daily.   gabapentin 100 MG capsule Commonly known as:  NEURONTIN Take 200 mg by mouth daily. 2 caps   hydrALAZINE 10 MG tablet Commonly known as:  APRESOLINE Take 10 mg by mouth every 8 (eight) hours.   MULTIVITAMIN & MINERAL PO Take 1  tablet by mouth daily.   OXYGEN Inhale into the lungs.   potassium chloride 10 MEQ tablet Commonly known as:  K-DUR,KLOR-CON Take 10 mEq by mouth 2 (two) times daily.   predniSONE 10 MG tablet Commonly known as:  DELTASONE Take 10 mg by mouth daily with breakfast.   ranitidine 300 MG capsule Commonly known as:  ZANTAC Take 300 mg by mouth every evening.   sodium chloride 0.65 % Soln nasal spray Commonly known as:  OCEAN Place 1 spray into both nostrils as needed for congestion. Reported on 12/31/2015   verapamil 240 MG CR tablet Commonly known as:  CALAN-SR Take 240 mg by mouth daily.       Allergies:  Allergies  Allergen Reactions  . Aspirin Other (See Comments)    Other reaction(s): PALPITATIONS Other reaction(s): DIFFICULTY BREATHING Heart flutter  . Incruse Ellipta [Umeclidinium Bromide] Shortness Of Breath  . Tramadol Shortness Of Breath  . Amlodipine Other (See Comments)    Other reaction(s): SWELLING  . Codeine Other (See Comments)    Hallucinations, can take Tylenol #3 now  . Doxycycline Other (See Comments)    Other reaction(s): NAUSEA,VOMITING  . Gabapentin Other (See Comments)    Other reaction(s): Other (See Comments) She could not swallow the large pill  . Hydrocodone Other (See Comments)    hallucinations  . Losartan Other (See Comments)    unknown  . Propoxyphene Nausea Only  . Tiotropium Itching and Rash    Past Medical History, Surgical history, Social history, and Family History were reviewed and updated.  Review of Systems: All other 10 point review of systems is negative.   Physical Exam:  vitals were not taken for this visit.  Wt Readings from Last 3 Encounters:  12/22/16 86 lb 6.7 oz (39.2 kg)  11/27/16 97 lb 9.6 oz (44.3 kg)  11/12/16 100 lb 3.2 oz (45.5 kg)    Thin African-American female. Head and neck exam shows no ocular or oral lesions. She has no palpable cervical or supraclavicular lymph nodes. Lungs are with decreased  breath sounds throughout both lung fields. She has some slight expiratory wheezes. She has some scattered rhonchi. Cardiac exam tachycardic but regular. She has occasional extra beat. Abdomen is soft. Bowel sounds are slightly decreased. She has no guarding or rebound tenderness. Extremities shows marked muscle atrophy in the upper and lower extremities. She has no palpable venous cord in the legs. ferred  Lab Results  Component Value Date   WBC 7.0 12/30/2016   HGB 10.0 (L) 12/30/2016   HCT 36.9 12/30/2016   MCV 85 12/30/2016   PLT 239 12/30/2016   Lab Results  Component Value Date   FERRITIN 327 (H) 10/21/2016   IRON 121 10/21/2016   TIBC 314 10/21/2016   UIBC 193 10/21/2016   IRONPCTSAT 38 10/21/2016   Lab Results  Component Value Date   RBC 4.34 12/30/2016   No results found for: KPAFRELGTCHN, LAMBDASER,  KAPLAMBRATIO No results found for: IGGSERUM, IGA, IGMSERUM No results found for: TOTALPROTELP, ALBUMINELP, A1GS, A2GS, BETS, BETA2SER, GAMS, MSPIKE, SPEI   Chemistry      Component Value Date/Time   NA 146 12/18/2016   NA 145 12/05/2016 0944   K 4.5 12/18/2016   K 4.0 12/05/2016 0944   CL 93 (L) 11/27/2016 1503   CL 92 (L) 10/21/2016 1117   CO2 43 Result Confirmed by Automated Dilution. (H) 12/05/2016 0944   BUN 14 12/18/2016   BUN 14.3 12/05/2016 0944   CREATININE 0.4 (A) 12/18/2016   CREATININE 0.7 12/05/2016 0944   GLU 254 12/10/2016      Component Value Date/Time   CALCIUM 10.3 12/05/2016 0944   ALKPHOS 69 12/10/2016   ALKPHOS 73 12/05/2016 0944   AST 73 (A) 12/10/2016   AST 67 (H) 12/05/2016 0944   ALT 36 (A) 12/10/2016   ALT 42 12/05/2016 0944   BILITOT 0.30 12/05/2016 0944     Impression and Plan: Ms. Bassette is 81 yo African American female with history of multiple DVT's and a superficial blood clot in her left arm. She now has pulmonary emboli. Again, she will need lifelong anticoagulation.  I told her that she may have intermittent hematuria  because of the kidney stones. I know that she has had lithotripsy. I know she sees urology.  I just feel bad for her. She really has a tough situation with her lungs being so poor because of COPD.  I probably would repeat her CT angiogram of the chest in 3 months just to see that these blood clots are better.  I spent about 45 minutes with her and her son. Again I was totally unaware that she had this situation happen.   I will need to get her back in 3 weeks   Volanda Napoleon, MD 4/3/20188:16 AM

## 2016-12-31 ENCOUNTER — Other Ambulatory Visit: Payer: Self-pay | Admitting: *Deleted

## 2016-12-31 DIAGNOSIS — J441 Chronic obstructive pulmonary disease with (acute) exacerbation: Secondary | ICD-10-CM

## 2016-12-31 NOTE — Patient Outreach (Signed)
[facility]) Care Management  12/31/2016  Holly Page [birth year] [id number]   Met with patient at bedside of facility.  Patient states she was near death and had a 17 day admission at [facility]. She has severe COPD on 3lpm and has blood clots in lungs. Patient feels her rehab is helping her get stronger but she worries about going home alone. Patient reports she wants to move out of her rental home into possible ALF if she would qualify or an independent living facility apartment.  Patient reports her son is supportive and is her emergency contact: [name], son [phone].  Patient feels she will be at the facility for another week or two, she has heard she will discharge 01/11/17.   Reviewed [facility] care management services, patient familiar with [facility] services, she verbally agrees to services. Consent on file, no changes needed.   Packet left in room.  Spoke with Vikki Ports, SW at facility regarding patient and [facility] services. She states no discharge date has been set.   Plan to refer to [facility] LCSW to assist patient with Assisted Living facility or Independent living facility placement.   Royetta Crochet. Laymond Purser, RN, BSN, [place] [phone]) Business Cell  [phone]) Toll Free Office

## 2017-01-01 ENCOUNTER — Encounter: Payer: Self-pay | Admitting: *Deleted

## 2017-01-01 ENCOUNTER — Other Ambulatory Visit: Payer: Self-pay | Admitting: *Deleted

## 2017-01-01 NOTE — Patient Outreach (Signed)
Incoming call from patient son, [name], he confirms patient HIPAA.  He reports that he meant to call yesterday. He states patient will need to discharge next week around the 20 th day as they cannot afford co pay days.  He states patient will not be able to go home alone, he states she is too frail, he was already helping her with meals before this past hospitalization and he knows she needs a lot more support now as she has had a decline.   He reports that patient may be able to go to independent living or assisted living but for the discharge she will come to his home and stay, he has an extra room and he knows this will be best.   He does however still want to converse with [facility] LCSW regarding options for patient.   Plan RNCM will update [facility] LCSW [name] on patient situation and have her contact son, [name] [phone] regarding patient care management needs.   Royetta Crochet. Laymond Purser, RN, BSN, [place] [phone]) Business Cell  [phone]) Toll Free Office

## 2017-01-02 ENCOUNTER — Other Ambulatory Visit: Payer: Self-pay | Admitting: *Deleted

## 2017-01-03 ENCOUNTER — Encounter: Payer: Self-pay | Admitting: *Deleted

## 2017-01-03 NOTE — Patient Outreach (Signed)
[facility]) Care Management  01/03/2017  Holly Page August 12, 1936 [id number]   CSW was able to make contact with patient today to perform the initial assessment, as well as assess and assist with social work needs and services, when Holly Page met with patient at [facility] [place] where patient currently resides to receive short-term rehabilitative services.  CSW introduced self, explained role and types of services provided through [facility] ([facility]).  CSW further explained to patient that CSW works with Burgess Amor, [place], also with [facility], that met with patient at bedside, just one day prior.  CSW then explained the reason for the visit, indicating that Holly Page thought that patient would benefit from social work services and resources to assist with discharge planning needs and services from the skilled nursing facility to a long-term care assisted living facility.  CSW obtained two HIPAA compliant identifiers from patient, which included patient's name and date of birth. Patient admits that she is no longer able to return home to live alone, unable to perform activities of daily living independently.  Patient reported that she has been talking with two of her sons, Holly Page and Holly Page about long-term care placement and that they are currently in the process of checking out various facilities of interest.  CSW agreed to work with patient, patient's sons and the admissions coordinator at [facility] to initiate long-term care placement arrangements for patient.  In the meantime, CSW will request an updated and signed FL-2 Form from patient's Primary Care Physician, Holly Page to fax to all assisted living facilities of choice.  Once bed offers are received, CSW will discuss these  offers with patient and her sons so that a bed can be secured in a facility of their choice.  Patient is aware that she will run out of Medicare days on the 20th of April, requiring assisted living placement prior to this date.  Holly Page has already admitted that they are unable to afford to pay for skilled care out of pocket.  CSW will encourage Holly Page to apply for Long-Term Care Medicaid for patient, at the [place], as soon as possible.  CSW agreed to follow-up with patient and sons if progress is made in the meantime; otherwise, CSW will plan to meet with patient on Wednesday, April 18th at [facility]. Holly Page, BSW, MSW, LCSW  Licensed Education officer, environmental Health System  Mailing [place] [address], [place], [place] [address] Physical Address-[address], [place], [place] [address] Toll Free Main # [phone] Fax # [phone] Cell # [phone]  Office # [phone] [email or web address]

## 2017-01-06 LAB — CBC AND DIFFERENTIAL
HCT: 36 % (ref 36–46)
Hemoglobin: 10 g/dL — AB (ref 12.0–16.0)
PLATELETS: 280 10*3/uL (ref 150–399)
WBC: 6 10*3/mL

## 2017-01-07 ENCOUNTER — Other Ambulatory Visit: Payer: Self-pay | Admitting: *Deleted

## 2017-01-07 DIAGNOSIS — J441 Chronic obstructive pulmonary disease with (acute) exacerbation: Secondary | ICD-10-CM

## 2017-01-07 NOTE — Patient Outreach (Addendum)
[facility]) Care Management  01/07/2017  KENIAH KLEMMER 1936/02/10 [id number]   Spoke with patient at facility, she states she will be discharging 01/08/17 and plans to go stay with her son until she can manage to move to an independent living apartment or Assisted living facility in the future.  Patient feels she is too weak and frail to move back home alone at this time, even though she states she is independent and does "not want to be in the way" at her son's home.  Patient states she will have home care, but not sure what agency. SW not at facility to ask which home care provider.   She agrees to transition of care program calls and to be evaluated for home visits.   Plan to refer to [place] for transition of care calls Plan to let [facility] LCSW know of patient discharge plan and disposition.  Plan for this RNCM to sign off as patient will discharge from Skilled.   Royetta Crochet. Laymond Purser, RN, BSN, [place] [phone]) Business Cell  [phone]) Toll Free Office

## 2017-01-09 ENCOUNTER — Non-Acute Institutional Stay (SKILLED_NURSING_FACILITY): Payer: Medicare Other | Admitting: Internal Medicine

## 2017-01-09 ENCOUNTER — Other Ambulatory Visit: Payer: Self-pay | Admitting: *Deleted

## 2017-01-09 ENCOUNTER — Encounter: Payer: Self-pay | Admitting: Internal Medicine

## 2017-01-09 DIAGNOSIS — A419 Sepsis, unspecified organism: Secondary | ICD-10-CM

## 2017-01-09 DIAGNOSIS — R1312 Dysphagia, oropharyngeal phase: Secondary | ICD-10-CM

## 2017-01-09 DIAGNOSIS — E872 Acidosis, unspecified: Secondary | ICD-10-CM

## 2017-01-09 DIAGNOSIS — J181 Lobar pneumonia, unspecified organism: Secondary | ICD-10-CM | POA: Diagnosis not present

## 2017-01-09 DIAGNOSIS — J441 Chronic obstructive pulmonary disease with (acute) exacerbation: Secondary | ICD-10-CM | POA: Diagnosis not present

## 2017-01-09 DIAGNOSIS — G629 Polyneuropathy, unspecified: Secondary | ICD-10-CM

## 2017-01-09 DIAGNOSIS — I2699 Other pulmonary embolism without acute cor pulmonale: Secondary | ICD-10-CM | POA: Diagnosis not present

## 2017-01-09 DIAGNOSIS — J189 Pneumonia, unspecified organism: Secondary | ICD-10-CM

## 2017-01-09 DIAGNOSIS — R6520 Severe sepsis without septic shock: Secondary | ICD-10-CM

## 2017-01-09 DIAGNOSIS — J101 Influenza due to other identified influenza virus with other respiratory manifestations: Secondary | ICD-10-CM | POA: Diagnosis not present

## 2017-01-09 DIAGNOSIS — I10 Essential (primary) hypertension: Secondary | ICD-10-CM

## 2017-01-09 DIAGNOSIS — R338 Other retention of urine: Secondary | ICD-10-CM | POA: Diagnosis not present

## 2017-01-09 DIAGNOSIS — K219 Gastro-esophageal reflux disease without esophagitis: Secondary | ICD-10-CM | POA: Diagnosis not present

## 2017-01-09 DIAGNOSIS — J9622 Acute and chronic respiratory failure with hypercapnia: Secondary | ICD-10-CM

## 2017-01-09 DIAGNOSIS — J9621 Acute and chronic respiratory failure with hypoxia: Secondary | ICD-10-CM

## 2017-01-09 DIAGNOSIS — Z86718 Personal history of other venous thrombosis and embolism: Secondary | ICD-10-CM

## 2017-01-09 NOTE — Patient Outreach (Signed)
[facility]) Care Management  01/09/2017  Holly Page [birth year] [id number]   Transition of care  RN attempted the initial outreach post SNF discharged 4/12 however unsuccessful. RN was able to leave a HIPAA approved voice message requesting a call back. Will further engage at that time for possible case management needs.   [name], RN Care Management Coordinator [place] Office [phone]

## 2017-01-09 NOTE — Patient Outreach (Signed)
[facility]) Care Management  01/09/2017  DAJAI WAHLERT 28-Aug-1936 [id number]   CSW made an attempt to try and contact patient today to follow-up regarding patient's recent discharge from [facility] [place] where patient was residing to receive short-term rehabilitative services, on Thursday, April 12th.  Patient was unavailable at the time of CSW's call; therefore, CSW left a HIPAA compliant message for patient on voicemail and is currently awaiting a return call.  If CSW does not receive a return call from patient within the next week, CSW will make a second outreach attempt.  CSW noted that patient was planning to go and stay with her son, [name], for a little while, upon release from the skilled facility. [name], BSW, MSW, LCSW  Licensed Education officer, environmental Health System  Mailing [place] [address] [place], [place] [address] Physical Address-[address], [place], [place] [address] Toll Free Main # [phone] Fax # [phone] Cell # [phone]  Office # [phone] [name]

## 2017-01-10 DIAGNOSIS — D689 Coagulation defect, unspecified: Secondary | ICD-10-CM | POA: Diagnosis not present

## 2017-01-10 DIAGNOSIS — I10 Essential (primary) hypertension: Secondary | ICD-10-CM | POA: Diagnosis not present

## 2017-01-10 DIAGNOSIS — J9611 Chronic respiratory failure with hypoxia: Secondary | ICD-10-CM | POA: Diagnosis not present

## 2017-01-10 DIAGNOSIS — I2510 Atherosclerotic heart disease of native coronary artery without angina pectoris: Secondary | ICD-10-CM | POA: Diagnosis not present

## 2017-01-10 DIAGNOSIS — J449 Chronic obstructive pulmonary disease, unspecified: Secondary | ICD-10-CM | POA: Diagnosis not present

## 2017-01-10 DIAGNOSIS — R2681 Unsteadiness on feet: Secondary | ICD-10-CM | POA: Diagnosis not present

## 2017-01-10 DIAGNOSIS — R488 Other symbolic dysfunctions: Secondary | ICD-10-CM | POA: Diagnosis not present

## 2017-01-10 DIAGNOSIS — D329 Benign neoplasm of meninges, unspecified: Secondary | ICD-10-CM | POA: Diagnosis not present

## 2017-01-10 DIAGNOSIS — M6281 Muscle weakness (generalized): Secondary | ICD-10-CM | POA: Diagnosis not present

## 2017-01-10 DIAGNOSIS — I2609 Other pulmonary embolism with acute cor pulmonale: Secondary | ICD-10-CM | POA: Diagnosis not present

## 2017-01-10 DIAGNOSIS — Z9981 Dependence on supplemental oxygen: Secondary | ICD-10-CM | POA: Diagnosis not present

## 2017-01-10 DIAGNOSIS — J9612 Chronic respiratory failure with hypercapnia: Secondary | ICD-10-CM | POA: Diagnosis not present

## 2017-01-10 DIAGNOSIS — R1312 Dysphagia, oropharyngeal phase: Secondary | ICD-10-CM | POA: Diagnosis not present

## 2017-01-12 ENCOUNTER — Other Ambulatory Visit: Payer: Self-pay | Admitting: *Deleted

## 2017-01-12 ENCOUNTER — Encounter: Payer: Self-pay | Admitting: Family Medicine

## 2017-01-12 ENCOUNTER — Ambulatory Visit (INDEPENDENT_AMBULATORY_CARE_PROVIDER_SITE_OTHER): Payer: Medicare Other | Admitting: Family Medicine

## 2017-01-12 VITALS — BP 102/62 | HR 73 | Temp 98.0°F | Ht 65.0 in | Wt 86.6 lb

## 2017-01-12 DIAGNOSIS — M6281 Muscle weakness (generalized): Secondary | ICD-10-CM | POA: Diagnosis not present

## 2017-01-12 DIAGNOSIS — D329 Benign neoplasm of meninges, unspecified: Secondary | ICD-10-CM | POA: Diagnosis not present

## 2017-01-12 DIAGNOSIS — R5381 Other malaise: Secondary | ICD-10-CM

## 2017-01-12 DIAGNOSIS — J9611 Chronic respiratory failure with hypoxia: Secondary | ICD-10-CM | POA: Diagnosis not present

## 2017-01-12 DIAGNOSIS — K219 Gastro-esophageal reflux disease without esophagitis: Secondary | ICD-10-CM | POA: Diagnosis not present

## 2017-01-12 DIAGNOSIS — R488 Other symbolic dysfunctions: Secondary | ICD-10-CM | POA: Diagnosis not present

## 2017-01-12 DIAGNOSIS — J9612 Chronic respiratory failure with hypercapnia: Secondary | ICD-10-CM | POA: Diagnosis not present

## 2017-01-12 DIAGNOSIS — D689 Coagulation defect, unspecified: Secondary | ICD-10-CM | POA: Diagnosis not present

## 2017-01-12 DIAGNOSIS — I2609 Other pulmonary embolism with acute cor pulmonale: Secondary | ICD-10-CM | POA: Diagnosis not present

## 2017-01-12 DIAGNOSIS — J449 Chronic obstructive pulmonary disease, unspecified: Secondary | ICD-10-CM | POA: Diagnosis not present

## 2017-01-12 DIAGNOSIS — Z9981 Dependence on supplemental oxygen: Secondary | ICD-10-CM | POA: Diagnosis not present

## 2017-01-12 DIAGNOSIS — I2510 Atherosclerotic heart disease of native coronary artery without angina pectoris: Secondary | ICD-10-CM | POA: Diagnosis not present

## 2017-01-12 DIAGNOSIS — Z86718 Personal history of other venous thrombosis and embolism: Secondary | ICD-10-CM | POA: Diagnosis not present

## 2017-01-12 DIAGNOSIS — R2681 Unsteadiness on feet: Secondary | ICD-10-CM | POA: Diagnosis not present

## 2017-01-12 DIAGNOSIS — R1312 Dysphagia, oropharyngeal phase: Secondary | ICD-10-CM | POA: Diagnosis not present

## 2017-01-12 DIAGNOSIS — I10 Essential (primary) hypertension: Secondary | ICD-10-CM | POA: Diagnosis not present

## 2017-01-12 LAB — COMPREHENSIVE METABOLIC PANEL
ALBUMIN: 3.9 g/dL (ref 3.5–5.2)
ALK PHOS: 52 U/L (ref 39–117)
ALT: 13 U/L (ref 0–35)
AST: 33 U/L (ref 0–37)
BILIRUBIN TOTAL: 0.4 mg/dL (ref 0.2–1.2)
BUN: 12 mg/dL (ref 6–23)
CALCIUM: 10.5 mg/dL (ref 8.4–10.5)
CO2: 45 mEq/L — ABNORMAL HIGH (ref 19–32)
CREATININE: 0.5 mg/dL (ref 0.40–1.20)
Chloride: 95 mEq/L — ABNORMAL LOW (ref 96–112)
GFR: 153.22 mL/min (ref >60.00)
Glucose, Bld: 133 mg/dL — ABNORMAL HIGH (ref 70–99)
Potassium: 4.1 mEq/L (ref 3.5–5.1)
Sodium: 146 mEq/L — ABNORMAL HIGH (ref 135–145)
TOTAL PROTEIN: 6.5 g/dL (ref 6.0–8.3)

## 2017-01-12 LAB — CBC
HEMATOCRIT: 33.3 % — AB (ref 36.0–46.0)
HEMOGLOBIN: 9.8 g/dL — AB (ref 12.0–15.0)
MCHC: 29.4 g/dL — ABNORMAL LOW (ref 30.0–36.0)
MCV: 79.1 fl (ref 78.0–100.0)
Platelets: 332 10*3/uL (ref 150.0–400.0)
RBC: 4.21 Mil/uL (ref 3.87–5.11)
RDW: 17.4 % — AB (ref 11.5–15.5)
WBC: 7.9 10*3/uL (ref 4.0–10.5)

## 2017-01-12 MED ORDER — APIXABAN 5 MG PO TABS: 5.0000 mg | ORAL_TABLET | Freq: Two times a day (BID) | ORAL | 11 refills | Status: DC

## 2017-01-12 MED ORDER — ACETAMINOPHEN-CODEINE #3 300-30 MG PO TABS: 1.0000 | ORAL_TABLET | Freq: Four times a day (QID) | ORAL | 1 refills | Status: DC | PRN

## 2017-01-12 MED ORDER — VERAPAMIL HCL ER 240 MG PO TBCR: 240.0000 mg | EXTENDED_RELEASE_TABLET | Freq: Every day | ORAL | 11 refills | Status: DC

## 2017-01-12 MED ORDER — RANITIDINE HCL 300 MG PO CAPS: 300.0000 mg | ORAL_CAPSULE | Freq: Every evening | ORAL | 11 refills | Status: AC

## 2017-01-12 MED ORDER — ALBUTEROL SULFATE HFA 108 (90 BASE) MCG/ACT IN AERS: 2.0000 | INHALATION_SPRAY | RESPIRATORY_TRACT | 6 refills | Status: DC | PRN

## 2017-01-12 MED ORDER — PREDNISONE 10 MG PO TABS: 10.0000 mg | ORAL_TABLET | Freq: Every day | ORAL | 6 refills | Status: DC

## 2017-01-12 MED ORDER — ALBUTEROL SULFATE (2.5 MG/3ML) 0.083% IN NEBU: 2.5000 mg | INHALATION_SOLUTION | Freq: Four times a day (QID) | RESPIRATORY_TRACT | 3 refills | Status: DC | PRN

## 2017-01-12 NOTE — Progress Notes (Signed)
[place] at [facility] [address], [place], [place] [address] [phone]  Date:  01/12/2017   Name:  Holly Page   DOB:  [birth year]   MRN:  [id number]  PCP:  Lamar Blinks, MD    Chief Complaint: Follow-up (Admitted to hosp 3/13/ until 3/23 then went to [facility] and was dischargeed 01/08/17. Pt states that she is a little bit stronger. )   History of Present Illness:  Holly Page is a 81 y.o. very pleasant female patient who presents with the following:  Here today for a follow-up visit She was admitted at [facility] from 3/14 to 3/23 with the following:  Hospital Course:  Patient is a 81 year old African American female with a PMH of very severe COPD on 4 L of oxygen at home, hypertension, tachycardia, admitted to the hospital for severe sepsis due to influenza B and pneumonia. She had been having progressively worsening breathing difficulty for 1 week and SPO2 which was only 50%. On EMSs arrival she was placed on BiPAP and brought into the ED. CXR showed airspace consolidation consistent with pneumonia in the right lower lobe and bolus emphysematous changes. Her PCO2 was greater than 100. She continued to required BiPAP for several days but has now been discontinued as she no longer needs it. CTA of the chest was ordered given her respiratory difficulty and was positive for submassive bilateral PEs. She was treated with a heparin drip and then switched to Eliquis. She has a history of current DVTs and had been taken off her anticoagulation and following with hematology. At this time she needs to continue on anticoagulation for life as this is her fifth episode of VTE. Additionally, she was treated for influenza B and PNA with Tamiflu and prophylactic antibiotics. At this time the patient is medically stable and will be discharged to the skilled nursing facility.  She has been at [facility] since her admission- she just  came home on Thursday, 4 days ago.  She fell once at the SNF but no falls since she was released to home.  Her son is living with her currently  PT and OT will be coming out to her home, and also home health. She has started to gain some weight back since she got back home; she likes the food better She is sleeping pretty well  She is on eliquis for her recurrent DVT and PE during recent hospital stay  She is on 2L of oxygen 24/7.   They started hydralazine at [facility] per her report.  However she has not continued this mediation since discharge-  She is just taking her verapamil. She doubled up on this today- she took 2 of her verapamil because she was afraid that her BP was high She is taking 10 mg of predniosone   Wt Readings from Last 3 Encounters:  01/12/17 86 lb 9.6 oz (39.3 kg)  12/30/16 88 lb 8 oz (40.1 kg)  12/22/16 86 lb 6.7 oz (39.2 kg)   She is NOT taking Neurontin; this was also rx while in rehab but she has chosen not to continue it.  She did not feel like it was helping She is not taking omeprazole for GERD- just zantac  She has several rx from [facility] that she did not fill yet.  Converted these to escripts but omitted several which were OTC prn products such as tylenol and nasal saline  She has an  appt to see hematology and plans to schedule with pulmonology soon   Patient Active Problem List   Diagnosis Date Noted  . Pulmonary embolus ([place]) 12/30/2016  . Severe sepsis ([place]) 12/26/2016  . Influenza B 12/26/2016  . PE (pulmonary thromboembolism) ([place]) 12/26/2016  . Acute urinary retention 12/26/2016  . Oropharyngeal dysphagia 12/26/2016  . Metabolic acidosis 63/87/5643  . Polyneuropathy 12/26/2016  . Arthritis   . Clotting disorder ([place])   . CAD (coronary artery disease)   . Fibroid   . Hypertension   . Kidney stone   . Protein-calorie malnutrition, severe 07/23/2016  . Shortness of breath 07/23/2016  . SOB (shortness of breath) 07/21/2016  . Acute on  chronic respiratory failure with hypoxia and hypercapnia ([place]) 07/21/2016  . Chronic respiratory failure ([place]) 04/10/2016  . COPD with exacerbation ([place]) 03/19/2016  . RBC microcytosis 03/19/2016  . COPD with acute exacerbation ([place]) 03/19/2016  . Oxygen dependent 12/31/2015  . Anxiety 11/08/2015  . Hyperthyroidism 09/07/2015  . Malnutrition of moderate degree 08/31/2015  . Abnormal LFTs 03/19/2015  . Back pain 03/19/2015  . HTN (hypertension) 03/01/2015  . Anemia 12/19/2014  . Calculus of kidney 08/30/2014  . Hematuria 08/11/2014  . History of DVT (deep vein thrombosis)   . Neuralgia neuritis, sciatic nerve 05/22/2014  . 1st degree AV block 04/24/2014  . History of colon polyps 04/24/2014  . Adaptive colitis 04/24/2014  . APC (atrial premature contractions) 04/24/2014  . Benign neoplasm of meninges ([place]) 02/10/2014  . Barrett esophagus 02/03/2014  . Spinal stenosis 12/15/2013  . Allergic rhinitis 11/21/2013  . COPD with emphysema Gold D   . GERD (gastroesophageal reflux disease)   . Lactose intolerance   . DDD (degenerative disc disease), lumbosacral 06/11/2010  . Diffuse cerebrovascular disease 06/11/2010    Past Medical History:  Diagnosis Date  . Anemia   . Arthritis   . CAD (coronary artery disease)   . Clotting disorder ([place])   . COPD (chronic obstructive pulmonary disease) ([place])   . DVT (deep venous thrombosis) ([place]) 1980s, recurrent 2015   Right DVT 2015  on xarelto  . Fibroid   . GERD (gastroesophageal reflux disease)   . Hypertension   . Hypertension   . Kidney stone   . Lactose intolerance   . Meningioma ([place])    followed by Dr Christella Noa  . Pneumonia   . Spinal stenosis     Past Surgical History:  Procedure Laterality Date  . CHOLECYSTECTOMY    . FOOT SURGERY    . INCISION AND DRAINAGE FOOT    . TONSILLECTOMY AND ADENOIDECTOMY    . TREATMENT FISTULA ANAL    . TUBAL LIGATION      Social History  Substance Use Topics  . Smoking status: Former  Smoker    Packs/day: 1.50    Years: 56.00    Types: Cigarettes    Quit date: 04/29/2012  . Smokeless tobacco: Never Used     Comment: quit smoking 3 years ago  . Alcohol use No     Comment: quit drinking beer 2005    Family History  Problem Relation Age of Onset  . COPD Father     smoker deceased.   . Emphysema Father   . Lupus Sister   . Heart attack Son   . Heart disease Son   . Hypertension Son   . Hypertension Son     Allergies  Allergen Reactions  . Aspirin Other (See Comments)    Other reaction(s): PALPITATIONS Other  reaction(s): DIFFICULTY BREATHING Heart flutter  . Incruse Ellipta [Umeclidinium Bromide] Shortness Of Breath  . Tramadol Shortness Of Breath  . Amlodipine Other (See Comments)    Other reaction(s): SWELLING  . Codeine Other (See Comments)    Hallucinations, can take Tylenol #3 now  . Doxycycline Other (See Comments)    Other reaction(s): NAUSEA,VOMITING  . Gabapentin Other (See Comments)    Other reaction(s): Other (See Comments) She could not swallow the large pill  . Hydrocodone Other (See Comments)    hallucinations  . Losartan Other (See Comments)    unknown  . Propoxyphene Nausea Only  . Tiotropium Itching and Rash    Medication list has been reviewed and updated.  Current Outpatient Prescriptions on File Prior to Visit  Medication Sig Dispense Refill  . Acidophilus Lactobacillus CAPS Take 1 capsule by mouth daily with breakfast.    . albuterol (PROVENTIL HFA;VENTOLIN HFA) 108 (90 Base) MCG/ACT inhaler Inhale 2 puffs into the lungs every 4 (four) hours as needed for wheezing or shortness of breath.    [name] albuterol (PROVENTIL) (2.5 MG/3ML) 0.083% nebulizer solution Take 2.5 mg by nebulization every 6 (six) hours as needed for wheezing or shortness of breath.    [name] aluminum-magnesium hydroxide-simethicone (MAALOX) [phone] MG/5ML SUSP Take 30 mLs by mouth every 4 (four) hours as needed.    [name] apixaban (ELIQUIS) 5 MG TABS tablet Take 5 mg by  mouth 2 (two) times daily.    . bisacodyl (DULCOLAX) 5 MG EC tablet Take 10 mg by mouth daily as needed.    . cholecalciferol (VITAMIN D) 1000 units tablet Take 1,000 Units by mouth daily.    [name] gabapentin (NEURONTIN) 100 MG capsule Take 200 mg by mouth daily. 2  caps    . hydrALAZINE (APRESOLINE) 10 MG tablet Take 10 mg by mouth every 8 (eight) hours.    . Multiple Vitamins-Minerals (MULTIVITAMIN & MINERAL PO) Take 1 tablet by mouth daily.    . Nutritional Supplements (ENSURE ENLIVE PO) Take 237 mLs by mouth 2 (two) times daily.    [name] omeprazole (PRILOSEC) 40 MG capsule Take 40 mg by mouth daily.    . OXYGEN Inhale 2 L into the lungs daily as needed. To maintain o2 saturation of 90%    . potassium chloride (K-DUR,KLOR-CON) 10 MEQ tablet Take 10 mEq by mouth daily. Patient takes 10 mEq once a day.    . predniSONE (DELTASONE) 10 MG tablet Take 10 mg by mouth daily with breakfast.     . ranitidine (ZANTAC) 300 MG capsule Take 300 mg by mouth every evening.     . sodium chloride (OCEAN) 0.65 % SOLN nasal spray Place 1 spray into both nostrils as needed for congestion. Reported on 12/31/2015    . verapamil (CALAN-SR) 240 MG CR tablet Take 240 mg by mouth daily.     No current facility-administered medications on file prior to visit.     Review of Systems:  As per HPI- otherwise negative.   Physical Examination: Vitals:   01/12/17 1147  BP: 102/62  Pulse: 73  Temp: 98 F (36.7 C)   Vitals:   01/12/17 1147  Weight: 86 lb 9.6 oz (39.3 kg)  Height: 5\' 5"  (1.651 m)   Body mass index is 14.41 kg/m. Ideal Body Weight: Weight in (lb) to have BMI = 25: 149.9  GEN: WDWN, NAD, Non-toxic, A & O x 3- thin, on oxygen.  Appears her normal self and in good spirits.  Here with her  son today HEENT: Atraumatic, Normocephalic. Neck supple. No masses, No LAD. Ears and Nose: No external deformity. CV: RRR, No M/G/R. No JVD. No thrill. No extra heart sounds. PULM: CTA B, no wheezes, crackles, rhonchi.  No retractions. No resp. distress. No accessory muscle use. ABD: S, NT, ND, +BS. No rebound. No HSM. EXTR: No c/c/e NEURO in [facility] PSYCH: Normally interactive. Conversant. Not depressed or anxious appearing.  Calm demeanor.    Assessment and Plan: COPD, severe ([place]) - Plan: predniSONE (DELTASONE) 10 MG tablet, albuterol (PROVENTIL) (2.5 MG/3ML) 0.083% nebulizer solution, albuterol (PROVENTIL HFA;VENTOLIN HFA) 108 (90 Base) MCG/ACT inhaler, acetaminophen-codeine (TYLENOL #3) 300-30 MG tablet  History of DVT (deep vein thrombosis) - Plan: apixaban (ELIQUIS) 5 MG TABS tablet  Physical deconditioning  Essential hypertension - Plan: verapamil (CALAN-SR) 240 MG CR tablet  Oxygen dependent - Plan: Comprehensive metabolic panel, CBC  Gastroesophageal reflux disease, esophagitis presence not specified - Plan: ranitidine (ZANTAC) 300 MG capsule  Here today to follow-up from recent hospital and then rehab stay for pneumonia/ sepsis/ COPD exacerbation.  She has done well and is getting her strength back.  Refilled her medication as below.  She also requests a refill of her tylenol #3 to use prn for more severe pain  Check K today and will be in touch with her labs   Meds ordered this encounter  Medications  . verapamil (CALAN-SR) 240 MG CR tablet    Sig: Take 1 tablet (240 mg total) by mouth daily.    Dispense:  30 tablet    Refill:  11  . ranitidine (ZANTAC) 300 MG capsule    Sig: Take 1 capsule (300 mg total) by mouth every evening.    Dispense:  30 capsule    Refill:  11  . predniSONE (DELTASONE) 10 MG tablet    Sig: Take 1 tablet (10 mg total) by mouth daily with breakfast.    Dispense:  30 tablet    Refill:  6  . apixaban (ELIQUIS) 5 MG TABS tablet    Sig: Take 1 tablet (5 mg total) by mouth 2 (two) times daily.    Dispense:  60 tablet    Refill:  11  . albuterol (PROVENTIL) (2.5 MG/3ML) 0.083% nebulizer solution    Sig: Take 3 mLs (2.5 mg total) by nebulization every 6 (six) hours  as needed for wheezing or shortness of breath.    Dispense:  75 mL    Refill:  3  . albuterol (PROVENTIL HFA;VENTOLIN HFA) 108 (90 Base) MCG/ACT inhaler    Sig: Inhale 2 puffs into the lungs every 4 (four) hours as needed for wheezing or shortness of breath.    Dispense:  2 Inhaler    Refill:  6  . acetaminophen-codeine (TYLENOL #3) 300-30 MG tablet    Sig: Take 1 tablet by mouth every 6 (six) hours as needed for moderate pain.    Dispense:  30 tablet    Refill:  1   Results for orders placed or performed in visit on 01/12/17  Comprehensive metabolic panel  Result Value Ref Range   Sodium 146 (H) 135 - 145 mEq/L   Potassium 4.1 3.5 - 5.1 mEq/L   Chloride 95 (L) 96 - 112 mEq/L   CO2 45 (H) 19 - 32 mEq/L   Glucose, Bld 133 (H) 70 - 99 mg/dL   BUN 12 6 - 23 mg/dL   Creatinine, Ser 0.50 0.40 - 1.20 mg/dL   Total Bilirubin 0.4 0.2 - 1.2 mg/dL  Alkaline Phosphatase 52 39 - 117 U/L   AST 33 0 - 37 U/L   ALT 13 0 - 35 U/L   Total Protein 6.5 6.0 - 8.3 g/dL   Albumin 3.9 3.5 - 5.2 g/dL   Calcium 10.5 8.4 - 10.5 mg/dL   GFR 153.22 >60.00 mL/min  CBC  Result Value Ref Range   WBC 7.9 4.0 - 10.5 K/uL   RBC 4.21 3.87 - 5.11 Mil/uL   Platelets 332.0 150.0 - 400.0 K/uL   Hemoglobin 9.8 (L) 12.0 - 15.0 g/dL   HCT 33.3 (L) 36.0 - 46.0 %   MCV 79.1 78.0 - 100.0 fl   MCHC 29.4 Repeated and verified X2. (L) 30.0 - 36.0 g/dL   RDW 17.4 (H) 11.5 - 15.5 %     Signed Lamar Blinks, MD

## 2017-01-12 NOTE — Patient Instructions (Addendum)
I refilled your medications today as per your med list.   Other things that are on your list but that are OTC- multivitamin, vitamin D  You had some things listed which are to be used just as needed: maalox for stomach upset, nasal saline spray for when your nose if dry, tylenol for aches and pains, ducolax for constipation  I took the hydralazine and gabapentin (neurontin) off your list since you are not taking these.    I also gave you an rx for tylenol with codeine to use as needed for more severe pain- remember this can make you feel sleepy!    We will check your potassium today

## 2017-01-12 NOTE — Patient Outreach (Signed)
[facility]) Care Management  01/12/2017  Holly Page 05-31-1936 [id number]   Transition of care post SNF discharge  Second outreach attempt however unsuccessful. RN able to leave HIPAA approved voice message requesting a call back. Will inquire further at that time. RN will schedule another outreach call this week.  [name], RN Care Management Coordinator [place] Office [phone]

## 2017-01-14 ENCOUNTER — Ambulatory Visit: Payer: Medicare Other | Admitting: *Deleted

## 2017-01-14 DIAGNOSIS — I2510 Atherosclerotic heart disease of native coronary artery without angina pectoris: Secondary | ICD-10-CM | POA: Diagnosis not present

## 2017-01-14 DIAGNOSIS — M6281 Muscle weakness (generalized): Secondary | ICD-10-CM | POA: Diagnosis not present

## 2017-01-14 DIAGNOSIS — R2681 Unsteadiness on feet: Secondary | ICD-10-CM | POA: Diagnosis not present

## 2017-01-14 DIAGNOSIS — D329 Benign neoplasm of meninges, unspecified: Secondary | ICD-10-CM | POA: Diagnosis not present

## 2017-01-14 DIAGNOSIS — D689 Coagulation defect, unspecified: Secondary | ICD-10-CM | POA: Diagnosis not present

## 2017-01-14 DIAGNOSIS — R488 Other symbolic dysfunctions: Secondary | ICD-10-CM | POA: Diagnosis not present

## 2017-01-14 DIAGNOSIS — Z9981 Dependence on supplemental oxygen: Secondary | ICD-10-CM | POA: Diagnosis not present

## 2017-01-14 DIAGNOSIS — R1312 Dysphagia, oropharyngeal phase: Secondary | ICD-10-CM | POA: Diagnosis not present

## 2017-01-14 DIAGNOSIS — J9611 Chronic respiratory failure with hypoxia: Secondary | ICD-10-CM | POA: Diagnosis not present

## 2017-01-14 DIAGNOSIS — J9612 Chronic respiratory failure with hypercapnia: Secondary | ICD-10-CM | POA: Diagnosis not present

## 2017-01-14 DIAGNOSIS — I10 Essential (primary) hypertension: Secondary | ICD-10-CM | POA: Diagnosis not present

## 2017-01-14 DIAGNOSIS — I2609 Other pulmonary embolism with acute cor pulmonale: Secondary | ICD-10-CM | POA: Diagnosis not present

## 2017-01-14 DIAGNOSIS — J449 Chronic obstructive pulmonary disease, unspecified: Secondary | ICD-10-CM | POA: Diagnosis not present

## 2017-01-15 ENCOUNTER — Other Ambulatory Visit: Payer: Self-pay | Admitting: *Deleted

## 2017-01-15 DIAGNOSIS — D329 Benign neoplasm of meninges, unspecified: Secondary | ICD-10-CM | POA: Diagnosis not present

## 2017-01-15 DIAGNOSIS — I2609 Other pulmonary embolism with acute cor pulmonale: Secondary | ICD-10-CM | POA: Diagnosis not present

## 2017-01-15 DIAGNOSIS — Z9981 Dependence on supplemental oxygen: Secondary | ICD-10-CM | POA: Diagnosis not present

## 2017-01-15 DIAGNOSIS — I2510 Atherosclerotic heart disease of native coronary artery without angina pectoris: Secondary | ICD-10-CM | POA: Diagnosis not present

## 2017-01-15 DIAGNOSIS — J9611 Chronic respiratory failure with hypoxia: Secondary | ICD-10-CM | POA: Diagnosis not present

## 2017-01-15 DIAGNOSIS — R2681 Unsteadiness on feet: Secondary | ICD-10-CM | POA: Diagnosis not present

## 2017-01-15 DIAGNOSIS — M6281 Muscle weakness (generalized): Secondary | ICD-10-CM | POA: Diagnosis not present

## 2017-01-15 DIAGNOSIS — J9612 Chronic respiratory failure with hypercapnia: Secondary | ICD-10-CM | POA: Diagnosis not present

## 2017-01-15 DIAGNOSIS — J449 Chronic obstructive pulmonary disease, unspecified: Secondary | ICD-10-CM | POA: Diagnosis not present

## 2017-01-15 DIAGNOSIS — R488 Other symbolic dysfunctions: Secondary | ICD-10-CM | POA: Diagnosis not present

## 2017-01-15 DIAGNOSIS — I10 Essential (primary) hypertension: Secondary | ICD-10-CM | POA: Diagnosis not present

## 2017-01-15 DIAGNOSIS — R1312 Dysphagia, oropharyngeal phase: Secondary | ICD-10-CM | POA: Diagnosis not present

## 2017-01-15 DIAGNOSIS — D689 Coagulation defect, unspecified: Secondary | ICD-10-CM | POA: Diagnosis not present

## 2017-01-15 NOTE — Patient Outreach (Signed)
[facility]) Care Management  01/15/2017  Holly Page 08/09/1936 [id number]    CSW made a second attempt to try and contact patient today to follow-up regarding patient's recent discharge from [facility] [place] where patient was residing to receive short-term rehabilitative services, on Thursday, April 12th.  Patient was unavailable at the time of CSW's call; therefore, CSW left a HIPAA compliant message for patient on voicemail and is currently awaiting a return call.  If CSW does not receive a return call from patient within the next week, CSW will make a third and final outreach attempt.   [name], BSW, MSW, LCSW  Licensed Education officer, [facility]  Mailing [place] [address] [place] [address], [place], [place] [address] Physical Address-[address], [place], [place] [address] Toll Free Main # [phone] Fax # [phone] Cell # [phone]  Office # [phone] [name]

## 2017-01-16 ENCOUNTER — Encounter: Payer: Self-pay | Admitting: *Deleted

## 2017-01-16 ENCOUNTER — Other Ambulatory Visit: Payer: Self-pay | Admitting: *Deleted

## 2017-01-16 DIAGNOSIS — J449 Chronic obstructive pulmonary disease, unspecified: Secondary | ICD-10-CM | POA: Diagnosis not present

## 2017-01-16 DIAGNOSIS — M5137 Other intervertebral disc degeneration, lumbosacral region: Secondary | ICD-10-CM | POA: Diagnosis not present

## 2017-01-16 DIAGNOSIS — I10 Essential (primary) hypertension: Secondary | ICD-10-CM | POA: Diagnosis not present

## 2017-01-16 DIAGNOSIS — Z9981 Dependence on supplemental oxygen: Secondary | ICD-10-CM | POA: Diagnosis not present

## 2017-01-16 DIAGNOSIS — M6281 Muscle weakness (generalized): Secondary | ICD-10-CM | POA: Diagnosis not present

## 2017-01-16 DIAGNOSIS — R1312 Dysphagia, oropharyngeal phase: Secondary | ICD-10-CM | POA: Diagnosis not present

## 2017-01-16 DIAGNOSIS — I2510 Atherosclerotic heart disease of native coronary artery without angina pectoris: Secondary | ICD-10-CM | POA: Diagnosis not present

## 2017-01-16 DIAGNOSIS — M48061 Spinal stenosis, lumbar region without neurogenic claudication: Secondary | ICD-10-CM | POA: Diagnosis not present

## 2017-01-16 DIAGNOSIS — D689 Coagulation defect, unspecified: Secondary | ICD-10-CM | POA: Diagnosis not present

## 2017-01-16 DIAGNOSIS — J45909 Unspecified asthma, uncomplicated: Secondary | ICD-10-CM | POA: Diagnosis not present

## 2017-01-16 DIAGNOSIS — J9612 Chronic respiratory failure with hypercapnia: Secondary | ICD-10-CM | POA: Diagnosis not present

## 2017-01-16 DIAGNOSIS — D329 Benign neoplasm of meninges, unspecified: Secondary | ICD-10-CM | POA: Diagnosis not present

## 2017-01-16 DIAGNOSIS — R2681 Unsteadiness on feet: Secondary | ICD-10-CM | POA: Diagnosis not present

## 2017-01-16 DIAGNOSIS — R488 Other symbolic dysfunctions: Secondary | ICD-10-CM | POA: Diagnosis not present

## 2017-01-16 DIAGNOSIS — I2609 Other pulmonary embolism with acute cor pulmonale: Secondary | ICD-10-CM | POA: Diagnosis not present

## 2017-01-16 DIAGNOSIS — J9611 Chronic respiratory failure with hypoxia: Secondary | ICD-10-CM | POA: Diagnosis not present

## 2017-01-16 NOTE — Patient Outreach (Signed)
[facility]) Care Management  01/16/2017  KESI PERROW May 05, 1936 [id number]   RN completed a third outreach attempt however unsuccessful. RN able to leave a HIPAA approved voice message requesting a call back. Will send outreach letter and allow pt time to respond to the outreach letter.  [name], RN Care Management Coordinator [place] Office [phone]

## 2017-01-19 ENCOUNTER — Encounter: Payer: Self-pay | Admitting: Family Medicine

## 2017-01-19 DIAGNOSIS — R2681 Unsteadiness on feet: Secondary | ICD-10-CM | POA: Diagnosis not present

## 2017-01-19 DIAGNOSIS — I2609 Other pulmonary embolism with acute cor pulmonale: Secondary | ICD-10-CM | POA: Diagnosis not present

## 2017-01-19 DIAGNOSIS — M6281 Muscle weakness (generalized): Secondary | ICD-10-CM | POA: Diagnosis not present

## 2017-01-19 DIAGNOSIS — I10 Essential (primary) hypertension: Secondary | ICD-10-CM | POA: Diagnosis not present

## 2017-01-19 DIAGNOSIS — I2510 Atherosclerotic heart disease of native coronary artery without angina pectoris: Secondary | ICD-10-CM | POA: Diagnosis not present

## 2017-01-19 DIAGNOSIS — Z9981 Dependence on supplemental oxygen: Secondary | ICD-10-CM | POA: Diagnosis not present

## 2017-01-19 DIAGNOSIS — D689 Coagulation defect, unspecified: Secondary | ICD-10-CM | POA: Diagnosis not present

## 2017-01-19 DIAGNOSIS — J449 Chronic obstructive pulmonary disease, unspecified: Secondary | ICD-10-CM | POA: Diagnosis not present

## 2017-01-19 DIAGNOSIS — R1312 Dysphagia, oropharyngeal phase: Secondary | ICD-10-CM | POA: Diagnosis not present

## 2017-01-19 DIAGNOSIS — J9612 Chronic respiratory failure with hypercapnia: Secondary | ICD-10-CM | POA: Diagnosis not present

## 2017-01-19 DIAGNOSIS — D329 Benign neoplasm of meninges, unspecified: Secondary | ICD-10-CM | POA: Diagnosis not present

## 2017-01-19 DIAGNOSIS — R488 Other symbolic dysfunctions: Secondary | ICD-10-CM | POA: Diagnosis not present

## 2017-01-19 DIAGNOSIS — J9611 Chronic respiratory failure with hypoxia: Secondary | ICD-10-CM | POA: Diagnosis not present

## 2017-01-20 DIAGNOSIS — I10 Essential (primary) hypertension: Secondary | ICD-10-CM | POA: Diagnosis not present

## 2017-01-20 DIAGNOSIS — J449 Chronic obstructive pulmonary disease, unspecified: Secondary | ICD-10-CM | POA: Diagnosis not present

## 2017-01-20 DIAGNOSIS — J9612 Chronic respiratory failure with hypercapnia: Secondary | ICD-10-CM | POA: Diagnosis not present

## 2017-01-20 DIAGNOSIS — Z9981 Dependence on supplemental oxygen: Secondary | ICD-10-CM | POA: Diagnosis not present

## 2017-01-20 DIAGNOSIS — M6281 Muscle weakness (generalized): Secondary | ICD-10-CM | POA: Diagnosis not present

## 2017-01-20 DIAGNOSIS — R2681 Unsteadiness on feet: Secondary | ICD-10-CM | POA: Diagnosis not present

## 2017-01-20 DIAGNOSIS — I2510 Atherosclerotic heart disease of native coronary artery without angina pectoris: Secondary | ICD-10-CM | POA: Diagnosis not present

## 2017-01-20 DIAGNOSIS — D329 Benign neoplasm of meninges, unspecified: Secondary | ICD-10-CM | POA: Diagnosis not present

## 2017-01-20 DIAGNOSIS — I2609 Other pulmonary embolism with acute cor pulmonale: Secondary | ICD-10-CM | POA: Diagnosis not present

## 2017-01-20 DIAGNOSIS — D689 Coagulation defect, unspecified: Secondary | ICD-10-CM | POA: Diagnosis not present

## 2017-01-20 DIAGNOSIS — J9611 Chronic respiratory failure with hypoxia: Secondary | ICD-10-CM | POA: Diagnosis not present

## 2017-01-20 DIAGNOSIS — R488 Other symbolic dysfunctions: Secondary | ICD-10-CM | POA: Diagnosis not present

## 2017-01-20 DIAGNOSIS — R1312 Dysphagia, oropharyngeal phase: Secondary | ICD-10-CM | POA: Diagnosis not present

## 2017-01-21 DIAGNOSIS — R8290 Unspecified abnormal findings in urine: Secondary | ICD-10-CM | POA: Diagnosis not present

## 2017-01-21 DIAGNOSIS — R319 Hematuria, unspecified: Secondary | ICD-10-CM | POA: Diagnosis not present

## 2017-01-22 ENCOUNTER — Ambulatory Visit (HOSPITAL_BASED_OUTPATIENT_CLINIC_OR_DEPARTMENT_OTHER)
Admission: RE | Admit: 2017-01-22 | Discharge: 2017-01-22 | Disposition: A | Discharge status: Home / Self Care | Payer: Medicare Other | Source: Ambulatory Visit | Attending: Hematology & Oncology | Admitting: Hematology & Oncology

## 2017-01-22 ENCOUNTER — Encounter: Payer: Self-pay | Admitting: *Deleted

## 2017-01-22 ENCOUNTER — Other Ambulatory Visit: Payer: Self-pay | Admitting: *Deleted

## 2017-01-22 ENCOUNTER — Other Ambulatory Visit (HOSPITAL_BASED_OUTPATIENT_CLINIC_OR_DEPARTMENT_OTHER): Payer: Medicare Other

## 2017-01-22 ENCOUNTER — Ambulatory Visit (HOSPITAL_BASED_OUTPATIENT_CLINIC_OR_DEPARTMENT_OTHER): Payer: Medicare Other | Admitting: Hematology & Oncology

## 2017-01-22 DIAGNOSIS — J439 Emphysema, unspecified: Secondary | ICD-10-CM | POA: Diagnosis not present

## 2017-01-22 DIAGNOSIS — D649 Anemia, unspecified: Secondary | ICD-10-CM

## 2017-01-22 DIAGNOSIS — J438 Other emphysema: Secondary | ICD-10-CM | POA: Diagnosis not present

## 2017-01-22 DIAGNOSIS — J984 Other disorders of lung: Secondary | ICD-10-CM | POA: Diagnosis not present

## 2017-01-22 DIAGNOSIS — J449 Chronic obstructive pulmonary disease, unspecified: Secondary | ICD-10-CM | POA: Diagnosis not present

## 2017-01-22 DIAGNOSIS — R319 Hematuria, unspecified: Secondary | ICD-10-CM | POA: Diagnosis not present

## 2017-01-22 DIAGNOSIS — J430 Unilateral pulmonary emphysema [MacLeod's syndrome]: Secondary | ICD-10-CM

## 2017-01-22 DIAGNOSIS — I2609 Other pulmonary embolism with acute cor pulmonale: Secondary | ICD-10-CM

## 2017-01-22 DIAGNOSIS — Z86718 Personal history of other venous thrombosis and embolism: Secondary | ICD-10-CM

## 2017-01-22 DIAGNOSIS — K909 Intestinal malabsorption, unspecified: Secondary | ICD-10-CM

## 2017-01-22 DIAGNOSIS — J9819 Other pulmonary collapse: Secondary | ICD-10-CM | POA: Insufficient documentation

## 2017-01-22 DIAGNOSIS — D500 Iron deficiency anemia secondary to blood loss (chronic): Secondary | ICD-10-CM

## 2017-01-22 LAB — CBC WITH DIFFERENTIAL (CANCER CENTER ONLY)
BASO#: 0 10*3/uL (ref 0.0–0.2)
BASO%: 0.2 % (ref 0.0–2.0)
EOS ABS: 0.1 10*3/uL (ref 0.0–0.5)
EOS%: 0.7 % (ref 0.0–7.0)
HEMATOCRIT: 35.2 % (ref 34.8–46.6)
HEMOGLOBIN: 10.3 g/dL — AB (ref 11.6–15.9)
LYMPH#: 2.1 10*3/uL (ref 0.9–3.3)
LYMPH%: 25.7 % (ref 14.0–48.0)
MCH: 24 pg — AB (ref 26.0–34.0)
MCHC: 29.3 g/dL — AB (ref 32.0–36.0)
MCV: 82 fL (ref 81–101)
MONO#: 0.8 10*3/uL (ref 0.1–0.9)
MONO%: 9.2 % (ref 0.0–13.0)
NEUT%: 64.2 % (ref 39.6–80.0)
NEUTROS ABS: 5.2 10*3/uL (ref 1.5–6.5)
Platelets: 308 10*3/uL (ref 145–400)
RBC: 4.3 10*6/uL (ref 3.70–5.32)
RDW: 16.8 % — ABNORMAL HIGH (ref 11.1–15.7)
WBC: 8.2 10*3/uL (ref 3.9–10.0)

## 2017-01-22 LAB — CMP (CANCER CENTER ONLY)
ALT(SGPT): 23 U/L (ref 10–47)
AST: 50 U/L — ABNORMAL HIGH (ref 11–38)
Albumin: 3.4 g/dL (ref 3.3–5.5)
Alkaline Phosphatase: 56 U/L (ref 26–84)
BUN: 8 mg/dL (ref 7–22)
CHLORIDE: 94 meq/L — AB (ref 98–108)
CO2: 36 mEq/L — ABNORMAL HIGH (ref 18–33)
Calcium: 9.9 mg/dL (ref 8.0–10.3)
Creat: 0.7 mg/dl (ref 0.6–1.2)
GLUCOSE: 135 mg/dL — AB (ref 73–118)
POTASSIUM: 3.8 meq/L (ref 3.3–4.7)
Sodium: 146 mEq/L — ABNORMAL HIGH (ref 128–145)
Total Bilirubin: 0.6 mg/dl (ref 0.20–1.60)
Total Protein: 6.4 g/dL (ref 6.4–8.1)

## 2017-01-22 MED ORDER — APIXABAN 5 MG PO TABS: 5.0000 mg | ORAL_TABLET | Freq: Two times a day (BID) | ORAL | 11 refills | Status: DC

## 2017-01-22 NOTE — Patient Outreach (Signed)
[facility]) Care Management  01/22/2017  Holly Page 05/18/1936 [id number]   CSW made a third and final attempt to try and contact patient today to perform phone assessment, as well as assess and assist with social work needs and services, without success.  A HIPAA compliant message was left for patient on voicemail.  CSW  continues to await a return call.  CSW will mail an outreach letter to patient's home, encouraging patient to contact CSW at their earliest convenience, if patient is interested in receiving social work services through [place] with [facility].  If CSW does not receive a return call from patient within the next 10 business days, CSW will proceed with case closure.  Required number of phone attempts will have been made and outreach letter mailed.   [name], BSW, MSW, LCSW  Licensed Education officer, environmental [facility]  Mailing [place] [address], [place], [place] [address] Physical Address-[address], [place], [place] [address] Toll Free Main # [phone] Fax # [phone] Cell # [phone]  Office # [phone] [email or web address]

## 2017-01-22 NOTE — Progress Notes (Signed)
Hematology and Oncology Follow Up Visit  Holly Page [id number] 01-03-1936 81 y.o. 01/22/2017   Principle Diagnosis:  Thromboembolic disease of the right thigh Superficial thrombus of the left arm  Pulmonary Embolism - dx in 11/2016  Current Therapy:   Eliquis 5 mg po BID    Interim History:  Holly Page is here today for follow-up. Holly Page is doing which better. Holly Page feels better.  Holly Page was seen by Holly Page urologist yesterday. He confirmed that Holly Page may have intermittent blood in the urine because of Holly Page kidney stones.  We last saw Holly Page, Holly Page iron studies look okay. Holly Page ferritin was 496 with an iron saturation of 32%.  Holly Page still is on oxygen. Holly Page is O2 dependent.  Holly Page's had no chest pain. Holly Page's had no increased shortness of breath.  Holly Page is eating pretty well. Holly Page's had no nausea or vomiting.  Overall, Holly Page performance status is ECOG 3.  Medications:  Allergies as of 01/22/2017      Reactions   Aspirin Other (See Comments)   Other reaction(s): PALPITATIONS Other reaction(s): DIFFICULTY BREATHING Heart flutter   Incruse Ellipta [umeclidinium Bromide] Shortness Of Breath   Tramadol Shortness Of Breath   Amlodipine Other (See Comments)   Other reaction(s): SWELLING   Codeine Other (See Comments)   Hallucinations, can take Tylenol #3 now   Doxycycline Other (See Comments)   Other reaction(s): NAUSEA,VOMITING   Gabapentin Other (See Comments)   Other reaction(s): Other (See Comments) Holly Page could not swallow the large pill   Hydrocodone Other (See Comments)   hallucinations   Losartan Other (See Comments)   unknown   Propoxyphene Nausea Only   Tiotropium Itching, Rash      Medication List       Accurate as of 01/22/17  1:31 PM. Always use your most recent med list.          acetaminophen-codeine 300-30 MG tablet Commonly known as:  TYLENOL #3 Take 1 tablet by mouth every 6 (six) hours as needed for moderate pain.   Acidophilus Lactobacillus Caps Take 1 capsule by  mouth daily with breakfast.   albuterol (2.5 MG/3ML) 0.083% nebulizer solution Commonly known as:  PROVENTIL Take 3 mLs (2.5 mg total) by nebulization every 6 (six) hours as needed for wheezing or shortness of breath.   albuterol 108 (90 Base) MCG/ACT inhaler Commonly known as:  PROVENTIL HFA;VENTOLIN HFA Inhale 2 puffs into the lungs every 4 (four) hours as needed for wheezing or shortness of breath.   apixaban 5 MG Tabs tablet Commonly known as:  ELIQUIS Take 1 tablet (5 mg total) by mouth 2 (two) times daily.   cholecalciferol 1000 units tablet Commonly known as:  VITAMIN D Take 1,000 Units by mouth daily.   ENSURE ENLIVE PO Take 237 mLs by mouth 2 (two) times daily.   hydrALAZINE 10 MG tablet Commonly known as:  APRESOLINE Take 10 mg by mouth every 8 (eight) hours.   Lactobacillus Acidophilus Powd Take by mouth.   MULTIVITAMIN & MINERAL PO Take 1 tablet by mouth daily.   OXYGEN Inhale 2 L into the lungs daily as needed. To maintain o2 saturation of 90%   potassium chloride 10 MEQ tablet Commonly known as:  K-DUR,KLOR-CON Take 10 mEq by mouth daily. Patient takes 10 mEq once a day.   predniSONE 10 MG tablet Commonly known as:  DELTASONE Take 1 tablet (10 mg total) by mouth daily with breakfast.   ranitidine 300 MG capsule Commonly known as:  ZANTAC Take 1  capsule (300 mg total) by mouth every evening.   verapamil 240 MG CR tablet Commonly known as:  CALAN-SR Take 1 tablet (240 mg total) by mouth daily.       Allergies:  Allergies  Allergen Reactions  . Aspirin Other (See Comments)    Other reaction(s): PALPITATIONS Other reaction(s): DIFFICULTY BREATHING Heart flutter  . Incruse Ellipta [Umeclidinium Bromide] Shortness Of Breath  . Tramadol Shortness Of Breath  . Amlodipine Other (See Comments)    Other reaction(s): SWELLING  . Codeine Other (See Comments)    Hallucinations, can take Tylenol #3 now  . Doxycycline Other (See Comments)    Other  reaction(s): NAUSEA,VOMITING  . Gabapentin Other (See Comments)    Other reaction(s): Other (See Comments) Holly Page could not swallow the large pill  . Hydrocodone Other (See Comments)    hallucinations  . Losartan Other (See Comments)    unknown  . Propoxyphene Nausea Only  . Tiotropium Itching and Rash    Past Medical History, Surgical history, Social history, and Family History were reviewed and updated.  Review of Systems: All other 10 point review of systems is negative.   Physical Exam:  weight is 92 lb 12.8 oz (42.1 kg). Holly Page oral temperature is 98.3 F (36.8 C). Holly Page blood pressure is 103/49 (abnormal) and Holly Page pulse is 73. Holly Page respiration is 16 and oxygen saturation is 100%.   Wt Readings from Last 3 Encounters:  01/22/17 92 lb 12.8 oz (42.1 kg)  01/12/17 86 lb 9.6 oz (39.3 kg)  12/30/16 88 lb 8 oz (40.1 kg)    Thin African-American female. Head and neck exam shows no ocular or oral lesions. Holly Page has no palpable cervical or supraclavicular lymph nodes. Lungs are with decreased breath sounds throughout both lung fields. Holly Page has some slight expiratory wheezes. Holly Page has some scattered rhonchi. Cardiac exam tachycardic but regular. Holly Page has occasional extra beat. Abdomen is soft. Bowel sounds are slightly decreased. Holly Page has no guarding or rebound tenderness. Extremities shows marked muscle atrophy in the upper and lower extremities. Holly Page has no palpable venous cord in the legs.   Lab Results  Component Value Date   WBC 8.2 01/22/2017   HGB 10.3 (L) 01/22/2017   HCT 35.2 01/22/2017   MCV 82 01/22/2017   PLT 308 01/22/2017   Lab Results  Component Value Date   FERRITIN 496 (H) 12/30/2016   IRON 84 12/30/2016   TIBC 264 12/30/2016   UIBC 180 12/30/2016   IRONPCTSAT 32 12/30/2016   Lab Results  Component Value Date   RBC 4.30 01/22/2017   No results found for: KPAFRELGTCHN, LAMBDASER, KAPLAMBRATIO No results found for: IGGSERUM, IGA, IGMSERUM No results found for:  Kathrynn Ducking, MSPIKE, SPEI   Chemistry      Component Value Date/Time   NA 146 (H) 01/12/2017 1218   NA 148 (H) 12/30/2016 0803   K 4.1 01/12/2017 1218   K 4.1 12/30/2016 0803   CL 95 (L) 01/12/2017 1218   CL 92 (L) 10/21/2016 1117   CO2 45 (H) 01/12/2017 1218   CO2 41 (H) 12/30/2016 0803   BUN 12 01/12/2017 1218   BUN 8.2 12/30/2016 0803   CREATININE 0.50 01/12/2017 1218   CREATININE 0.6 12/30/2016 0803   GLU 105 12/25/2016      Component Value Date/Time   CALCIUM 10.5 01/12/2017 1218   CALCIUM 10.0 12/30/2016 0803   ALKPHOS 52 01/12/2017 1218   ALKPHOS 67 12/30/2016 0803   AST  33 01/12/2017 1218   AST 44 (H) 12/30/2016 0803   ALT 13 01/12/2017 1218   ALT 24 12/30/2016 0803   BILITOT 0.4 01/12/2017 1218   BILITOT 0.39 12/30/2016 0803     Impression and Plan: Holly Page is 81 yo African American female with history of multiple DVT's and a superficial blood clot in Holly Page left arm. Holly Page now has pulmonary emboli. Again, Holly Page will need lifelong anticoagulation.  I told Holly Page that Holly Page may have intermittent hematuria because of the kidney stones. I know that Holly Page has had lithotripsy. I know Holly Page sees urology.  I just feel bad for Holly Page. Holly Page really has a tough situation with Holly Page lungs being so poor because of COPD.  I probably would repeat Holly Page CT angiogram of the chest in 3 months just to see that these blood clots are better.  I think we can get Holly Page back in 6 weeks now. I probably would not do another CT angiogram on Holly Page until late summer.    Volanda Napoleon, MD 4/26/20181:31 PM

## 2017-01-23 ENCOUNTER — Encounter: Payer: Self-pay | Admitting: Hematology & Oncology

## 2017-01-23 DIAGNOSIS — D329 Benign neoplasm of meninges, unspecified: Secondary | ICD-10-CM | POA: Diagnosis not present

## 2017-01-23 DIAGNOSIS — D500 Iron deficiency anemia secondary to blood loss (chronic): Secondary | ICD-10-CM

## 2017-01-23 DIAGNOSIS — J9612 Chronic respiratory failure with hypercapnia: Secondary | ICD-10-CM | POA: Diagnosis not present

## 2017-01-23 DIAGNOSIS — I2609 Other pulmonary embolism with acute cor pulmonale: Secondary | ICD-10-CM | POA: Diagnosis not present

## 2017-01-23 DIAGNOSIS — R2681 Unsteadiness on feet: Secondary | ICD-10-CM | POA: Diagnosis not present

## 2017-01-23 DIAGNOSIS — I2510 Atherosclerotic heart disease of native coronary artery without angina pectoris: Secondary | ICD-10-CM | POA: Diagnosis not present

## 2017-01-23 DIAGNOSIS — R1312 Dysphagia, oropharyngeal phase: Secondary | ICD-10-CM | POA: Diagnosis not present

## 2017-01-23 DIAGNOSIS — I10 Essential (primary) hypertension: Secondary | ICD-10-CM | POA: Diagnosis not present

## 2017-01-23 DIAGNOSIS — R488 Other symbolic dysfunctions: Secondary | ICD-10-CM | POA: Diagnosis not present

## 2017-01-23 DIAGNOSIS — M6281 Muscle weakness (generalized): Secondary | ICD-10-CM | POA: Diagnosis not present

## 2017-01-23 DIAGNOSIS — Z9981 Dependence on supplemental oxygen: Secondary | ICD-10-CM | POA: Diagnosis not present

## 2017-01-23 DIAGNOSIS — J449 Chronic obstructive pulmonary disease, unspecified: Secondary | ICD-10-CM | POA: Diagnosis not present

## 2017-01-23 DIAGNOSIS — K909 Intestinal malabsorption, unspecified: Secondary | ICD-10-CM

## 2017-01-23 DIAGNOSIS — J9611 Chronic respiratory failure with hypoxia: Secondary | ICD-10-CM | POA: Diagnosis not present

## 2017-01-23 DIAGNOSIS — D689 Coagulation defect, unspecified: Secondary | ICD-10-CM | POA: Diagnosis not present

## 2017-01-23 HISTORY — DX: Iron deficiency anemia secondary to blood loss (chronic): D50.0

## 2017-01-23 HISTORY — DX: Intestinal malabsorption, unspecified: K90.9

## 2017-01-23 LAB — FERRITIN: FERRITIN: 253 ng/mL (ref 9–269)

## 2017-01-23 LAB — IRON AND TIBC
%SAT: 12 % — AB (ref 21–57)
IRON: 35 ug/dL — AB (ref 41–142)
TIBC: 299 ug/dL (ref 236–444)
UIBC: 263 ug/dL (ref 120–384)

## 2017-01-23 LAB — D-DIMER, QUANTITATIVE: D-DIMER: <0.2 mg/L FEU (ref 0.00–0.49)

## 2017-01-23 NOTE — Addendum Note (Signed)
Addended by: Burney Gauze R on: 01/23/2017 12:50 PM   Modules accepted: Orders

## 2017-01-24 DIAGNOSIS — J449 Chronic obstructive pulmonary disease, unspecified: Secondary | ICD-10-CM | POA: Diagnosis not present

## 2017-01-26 ENCOUNTER — Encounter: Payer: Self-pay | Admitting: Internal Medicine

## 2017-01-26 DIAGNOSIS — R1312 Dysphagia, oropharyngeal phase: Secondary | ICD-10-CM | POA: Diagnosis not present

## 2017-01-26 DIAGNOSIS — J449 Chronic obstructive pulmonary disease, unspecified: Secondary | ICD-10-CM | POA: Diagnosis not present

## 2017-01-26 DIAGNOSIS — Z9981 Dependence on supplemental oxygen: Secondary | ICD-10-CM | POA: Diagnosis not present

## 2017-01-26 DIAGNOSIS — R2681 Unsteadiness on feet: Secondary | ICD-10-CM | POA: Diagnosis not present

## 2017-01-26 DIAGNOSIS — I10 Essential (primary) hypertension: Secondary | ICD-10-CM | POA: Diagnosis not present

## 2017-01-26 DIAGNOSIS — J9612 Chronic respiratory failure with hypercapnia: Secondary | ICD-10-CM | POA: Diagnosis not present

## 2017-01-26 DIAGNOSIS — J9611 Chronic respiratory failure with hypoxia: Secondary | ICD-10-CM | POA: Diagnosis not present

## 2017-01-26 DIAGNOSIS — I2609 Other pulmonary embolism with acute cor pulmonale: Secondary | ICD-10-CM | POA: Diagnosis not present

## 2017-01-26 DIAGNOSIS — M6281 Muscle weakness (generalized): Secondary | ICD-10-CM | POA: Diagnosis not present

## 2017-01-26 DIAGNOSIS — D329 Benign neoplasm of meninges, unspecified: Secondary | ICD-10-CM | POA: Diagnosis not present

## 2017-01-26 DIAGNOSIS — R488 Other symbolic dysfunctions: Secondary | ICD-10-CM | POA: Diagnosis not present

## 2017-01-26 DIAGNOSIS — D689 Coagulation defect, unspecified: Secondary | ICD-10-CM | POA: Diagnosis not present

## 2017-01-26 DIAGNOSIS — I2510 Atherosclerotic heart disease of native coronary artery without angina pectoris: Secondary | ICD-10-CM | POA: Diagnosis not present

## 2017-01-26 NOTE — Progress Notes (Signed)
Location:  [place] Room Number: 111P Place of Service:  SNF (31) Noah Delaine. Sheppard Coil, MD  PCP: Darreld Mclean, MD Patient Care Team: Copland, Gay Filler, MD as PCP - General (Family Medicine) Elsie Stain, MD as Consulting Physician (Pulmonary Disease)  Extended Emergency Contact Information Primary Emergency Contact: [facility] Address: [address]          [place], [place] [address] [place] Phone: [phone] Relation: Son Secondary Emergency Contact: Withers,Martin Address: [address]          [place], [place] [address] [place] Phone: [phone] Mobile Phone: [phone] Relation: Son  Allergies  Allergen Reactions  . Aspirin Other (See Comments)    Other reaction(s): PALPITATIONS Other reaction(s): DIFFICULTY BREATHING Heart flutter  . Incruse Ellipta [Umeclidinium Bromide] Shortness Of Breath  . Tramadol Shortness Of Breath  . Amlodipine Other (See Comments)    Other reaction(s): SWELLING  . Codeine Other (See Comments)    Hallucinations, can take Tylenol #3 now  . Doxycycline Other (See Comments)    Other reaction(s): NAUSEA,VOMITING  . Gabapentin Other (See Comments)    Other reaction(s): Other (See Comments) She could not swallow the large pill  . Hydrocodone Other (See Comments)    hallucinations  . Losartan Other (See Comments)    unknown  . Propoxyphene Nausea Only  . Tiotropium Itching and Rash    Chief Complaint  Patient presents with  . Discharge Note    Discharged from SNF    HPI:  81 y.o. female  with Very severe COPD on 4 L of oxygen at home, hypertension, tachycardia, who was admitted to [facility] from 12/11/2018 third for severe sepsis due to influenza B and pneumonia. She had been having progressively worsening breathing difficulty for one week and O2 sats in the 50s. When EMS arrived she was placed on BiPAP and brought to the emergency  department. Chest x-ray showed airspace consolidation consistent with pneumonia in the right lower lobe and emphysema. Her PCO2 was greater than 100. She required BiPAP for several days but was able to be weaned off by BiPAP. Continue to have respiratory  difficulty and a CTA was done of her chest which was positive for submassive bilateral PEs. She was treated with a heparin drip and then switched Eliquis. History of recurrent DVTs and has been off her anticoagulation and followed with rheumatology. At this time is considered she'll need to be on anticoagulation for life as this is her fifth   episode of VTE Patient's influenza B was treated with Tamiflu and she was treated with prophylactic antibiotics. Hospital course was further complicated by acute urinary retention, now resolved and acute COPD exacerbation. Pt was admitted to SNF with generalized weakness for OT/PTand is now ready to be d/c to home.    Past Medical History:  Diagnosis Date  . Anemia   . Arthritis   . CAD (coronary artery disease)   . Clotting disorder ([place])   . COPD (chronic obstructive pulmonary disease) ([place])   . DVT (deep venous thrombosis) ([place]) 1980s, recurrent 2015   Right DVT 2015  on xarelto  . Fibroid   . GERD (gastroesophageal reflux disease)   . Hypertension   . Hypertension   . Iron deficiency anemia due to chronic blood loss 01/23/2017  . 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 Kidney stone   . Lactose intolerance   . Meningioma ([place])  followed by Dr Christella Noa  . Pneumonia   . Spinal stenosis     Past Surgical History:  Procedure Laterality Date  . CHOLECYSTECTOMY    . FOOT SURGERY    . INCISION AND DRAINAGE FOOT    . TONSILLECTOMY AND ADENOIDECTOMY    . TREATMENT FISTULA ANAL    . TUBAL LIGATION       reports that she quit smoking about 4 years ago. Her smoking use included Cigarettes. She has a 84.00 pack-year smoking history. She has never used smokeless tobacco. She reports that she does not drink  alcohol or use drugs. Social History   Social History  . Marital status: Divorced    Spouse name: N/A  . Number of children: 4  . Years of education: college   Occupational History  . Retired    Social History Main Topics  . Smoking status: Former Smoker    Packs/day: 1.50    Years: 56.00    Types: Cigarettes    Quit date: 04/29/2012  . Smokeless tobacco: Never Used     Comment: quit smoking 3 years ago  . Alcohol use No     Comment: quit drinking beer 2005  . Drug use: No  . Sexual activity: Not on file   Other Topics Concern  . Not on file   Social History Narrative   Patient lives at home alone- divorced, no pets.     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 DEXA SCAN  06/09/2003  . MAMMOGRAM  09/29/2013  . INFLUENZA VACCINE  04/29/2017  . PNA vac Low Risk Adult  Completed    Medications: Allergies as of 01/09/2017      Reactions   Aspirin Other (See Comments)   Other reaction(s): PALPITATIONS Other reaction(s): DIFFICULTY BREATHING Heart flutter   Incruse Ellipta [umeclidinium Bromide] Shortness Of Breath   Tramadol Shortness Of Breath   Amlodipine Other (See Comments)   Other reaction(s): SWELLING   Codeine Other (See Comments)   Hallucinations, can take Tylenol #3 now   Doxycycline Other (See Comments)   Other reaction(s): NAUSEA,VOMITING   Gabapentin Other (See Comments)   Other reaction(s): Other (See Comments) She could not swallow the large pill   Hydrocodone Other (See Comments)   hallucinations   Losartan Other (See Comments)   unknown   Propoxyphene Nausea Only   Tiotropium Itching, Rash      Medication List       Accurate as of 01/09/17 11:59 PM. Always use your most recent med list.            acetaminophen-codeine 300-30 MG tablet Commonly known as:  TYLENOL #3 Take 1 tablet by mouth every 6 (six) hours as needed for moderate pain.   Acidophilus Lactobacillus Caps Take 1 capsule by mouth daily with breakfast.   albuterol 108 (90 Base) MCG/ACT inhaler Commonly known as:  PROVENTIL HFA;VENTOLIN HFA Inhale 2 puffs into the lungs every 4 (four) hours as needed for wheezing or shortness of breath.   albuterol (2.5 MG/3ML) 0.083% nebulizer solution Commonly known as:  PROVENTIL Take 2.5 mg by nebulization every 6 (six) hours as needed for wheezing  or shortness of breath.   albuterol (2.5 MG/3ML) 0.083% nebulizer solution Commonly known as:  PROVENTIL Take 3 mLs (2.5 mg total) by nebulization every 6 (six) hours as needed for wheezing or shortness of breath.   albuterol 108 (90 Base) MCG/ACT inhaler Commonly known as:  PROVENTIL HFA;VENTOLIN HFA Inhale 2 puffs into the lungs every 4 (four) hours as needed for wheezing or shortness of breath.   aluminum-magnesium hydroxide-simethicone 856-314-97 MG/5ML Susp Commonly known as:  MAALOX Take 30 mLs by mouth every 4 (four) hours as needed.   apixaban 5 MG Tabs tablet Commonly known as:  ELIQUIS Take 5 mg by mouth 2 (two) times daily.   apixaban 5 MG Tabs tablet Commonly known as:  ELIQUIS Take 1 tablet (5 mg total) by mouth 2 (two) times daily.   bisacodyl 5 MG EC tablet Commonly known as:  DULCOLAX Take 10 mg by mouth daily as needed.   cholecalciferol 1000 units tablet Commonly known as:  VITAMIN D Take 1,000 Units by mouth daily.   ENSURE ENLIVE PO Take 237 mLs by mouth 2 (two) times daily.   gabapentin 100 MG capsule Commonly known as:  NEURONTIN Take 200 mg by mouth daily. 2  caps   hydrALAZINE 10 MG tablet Commonly known as:  APRESOLINE Take 10 mg by mouth every 8 (eight) hours.   Lactobacillus Acidophilus Powd Take by mouth.   MULTIVITAMIN & MINERAL PO Take 1 tablet by mouth daily.   omeprazole 40  MG capsule Commonly known as:  PRILOSEC Take 40 mg by mouth daily.   OXYGEN Inhale 2 L into the lungs daily as needed. To maintain o2 saturation of 90%   potassium chloride 10 MEQ tablet Commonly known as:  K-DUR,KLOR-CON Take 10 mEq by mouth daily. Patient takes 10 mEq once a day.   predniSONE 10 MG tablet Commonly known as:  DELTASONE Take 10 mg by mouth daily with breakfast.   predniSONE 10 MG tablet Commonly known as:  DELTASONE Take 1 tablet (10 mg total) by mouth daily with breakfast.   ranitidine 300 MG capsule Commonly known as:  ZANTAC Take 300 mg by mouth every evening.   ranitidine 300 MG capsule Commonly known as:  ZANTAC Take 1 capsule (300 mg total) by mouth every evening.   sodium chloride 0.65 % Soln nasal spray Commonly known as:  OCEAN Place 1 spray into both nostrils as needed for congestion. Reported on 12/31/2015   verapamil 240 MG CR tablet Commonly known as:  CALAN-SR Take 240 mg by mouth daily.   verapamil 240 MG CR tablet Commonly known as:  CALAN-SR Take 1 tablet (240 mg total) by mouth daily.        Vitals:   01/09/17 1548  BP: (!) 161/73  Pulse: 94  Resp: (!) 22  Temp: 98.1 F (36.7 C)  SpO2: 97%  Weight: 88 lb (39.9 kg)  Height: 5' (1.524 m)   Body mass index is 17.19 kg/m.  Physical Exam  GENERAL APPEARANCE: Alert, conversant. No acute distress.  HEENT: Unremarkable. RESPIRATORY: Breathing is even, unlabored. Lung sounds are clear   CARDIOVASCULAR: Heart RRR no murmurs, rubs or gallops. No peripheral edema.  GASTROINTESTINAL: Abdomen is soft, non-tender, not distended w/ normal bowel sounds.  NEUROLOGIC: Cranial nerves 2-12 grossly intact. Moves all extremities   Labs reviewed: Basic Metabolic Panel:  Recent Labs  11/27/16 1503  12/30/16 0803 01/12/17 1218 01/22/17 1301  NA 142  < > 148* 146* 146*  K 4.0  < >  4.1 4.1 3.8  CL 93*  --   --  95* 94*  CO2 44*  < > 41* 45* 36*  GLUCOSE 91  < > 172* 133* 135*  BUN  11  < > 8.2 12 8   CREATININE 0.57  < > 0.6 0.50 0.7  CALCIUM 9.9  < > 10.0 10.5 9.9  < > = values in this interval not displayed. No results found for: [facility] Liver Function Tests:  Recent Labs  12/30/16 0803 01/12/17 1218 01/22/17 1301  AST 44* 33 50*  ALT 24 13 23   ALKPHOS 67 52 56  BILITOT 0.39 0.4 0.60  PROT 6.5 6.5 6.4  ALBUMIN 3.5 3.9 3.4   No results for input(s): LIPASE, AMYLASE in the last 8760 hours. No results for input(s): AMMONIA in the last 8760 hours. CBC:  Recent Labs  12/05/16 0944  12/30/16 0803 01/06/17 01/12/17 1218 01/22/17 1301  WBC 5.8  < > 7.0 6.0 7.9 8.2  NEUTROABS 4.2  --  4.8  --   --  5.2  HGB 10.7*  < > 10.0* 10.0* 9.8* 10.3*  HCT 38.8  < > 36.9 36 33.3* 35.2  MCV 83  --  85  --  79.1 82  PLT 248  < > 239 280 332.0 308  < > = values in this interval not displayed. Lipid No results for input(s): CHOL, HDL, LDLCALC, TRIG in the last 8760 hours. Cardiac Enzymes:  Recent Labs  07/21/16 1145 07/21/16 1946 07/22/16 0305  TROPONINI <0.03 0.13* <0.03   BNP:  Recent Labs  03/19/16 1140  BNP 35.2   CBG:  Recent Labs  03/20/16 0747 03/21/16 0727  GLUCAP 133* 123*    Procedures and Imaging Studies During Stay: Dg Chest 2 View  Result Date: 01/22/2017 CLINICAL DATA:  Recent hospitalization. Severe COPD. Evaluate for pneumonia EXAM: CHEST  2 VIEW COMPARISON:  12/18/2016 FINDINGS: Chronic right middle lobe collapse. Advanced emphysema. No acute airspace disease, effusion, or pneumothorax. Normal heart size and stable mediastinal contours. IMPRESSION: 1. No evidence of active disease. 2. Bullous emphysema and chronic right middle lobe collapse. 3. Right lower lobe airspace disease seen on chest CT 12/12/2016 cleared by the time of abdominal CT 12/21/2016. Electronically Signed   By: Monte Fantasia M.D.   On: 01/22/2017 14:18    Assessment/Plan:   Severe sepsis (HCC)  Influenza B  Acute on chronic respiratory failure with  hypoxia and hypercapnia (HCC)  Pneumonia of right lower lobe due to infectious organism [facility])  COPD with acute exacerbation (HCC)  PE (pulmonary thromboembolism) ([place])  History of DVT (deep vein thrombosis)  Acute urinary retention  Metabolic acidosis  Oropharyngeal dysphagia  Polyneuropathy  Gastroesophageal reflux disease without esophagitis  Essential hypertension   Patient is being discharged with the following home health services:  OT/PT/Nursing  Patient is being discharged with the following durable medical equipment:  none  Patient has been advised to f/u with their PCP in 1-2 weeks to bring them up to date on their rehab stay.  Social services at facility was responsible for arranging this appointment.  Pt was provided with a 30 day supply of prescriptions for medications and refills must be obtained from their PCP.  For controlled substances, a more limited supply may be provided adequate until PCP appointment only.  MEDICATIONS HAVE BEEN RECONCILED.    Time spent > 30 min;> 50% of time with patient was spent reviewing records, labs, tests and studies, counseling and developing plan of care  Noah Delaine. Sheppard Coil, MD

## 2017-01-27 ENCOUNTER — Telehealth: Payer: Self-pay | Admitting: *Deleted

## 2017-01-27 DIAGNOSIS — J449 Chronic obstructive pulmonary disease, unspecified: Secondary | ICD-10-CM | POA: Diagnosis not present

## 2017-01-27 DIAGNOSIS — J9611 Chronic respiratory failure with hypoxia: Secondary | ICD-10-CM | POA: Diagnosis not present

## 2017-01-27 DIAGNOSIS — I10 Essential (primary) hypertension: Secondary | ICD-10-CM | POA: Diagnosis not present

## 2017-01-27 DIAGNOSIS — J9612 Chronic respiratory failure with hypercapnia: Secondary | ICD-10-CM | POA: Diagnosis not present

## 2017-01-27 DIAGNOSIS — I2510 Atherosclerotic heart disease of native coronary artery without angina pectoris: Secondary | ICD-10-CM | POA: Diagnosis not present

## 2017-01-27 DIAGNOSIS — R1312 Dysphagia, oropharyngeal phase: Secondary | ICD-10-CM | POA: Diagnosis not present

## 2017-01-27 DIAGNOSIS — R2681 Unsteadiness on feet: Secondary | ICD-10-CM | POA: Diagnosis not present

## 2017-01-27 DIAGNOSIS — D689 Coagulation defect, unspecified: Secondary | ICD-10-CM | POA: Diagnosis not present

## 2017-01-27 DIAGNOSIS — I2609 Other pulmonary embolism with acute cor pulmonale: Secondary | ICD-10-CM | POA: Diagnosis not present

## 2017-01-27 DIAGNOSIS — R488 Other symbolic dysfunctions: Secondary | ICD-10-CM | POA: Diagnosis not present

## 2017-01-27 DIAGNOSIS — D329 Benign neoplasm of meninges, unspecified: Secondary | ICD-10-CM | POA: Diagnosis not present

## 2017-01-27 DIAGNOSIS — M6281 Muscle weakness (generalized): Secondary | ICD-10-CM | POA: Diagnosis not present

## 2017-01-27 DIAGNOSIS — Z9981 Dependence on supplemental oxygen: Secondary | ICD-10-CM | POA: Diagnosis not present

## 2017-01-27 NOTE — Telephone Encounter (Addendum)
Message left on voice mail. Message sent to scheduler  ----- Message from Volanda Napoleon, MD sent at 01/23/2017 12:47 PM EDT ----- Call - iron is low!!!  She needs 1 dose of Feraheme.  Please set up!!!  Holly Page

## 2017-01-28 DIAGNOSIS — I10 Essential (primary) hypertension: Secondary | ICD-10-CM | POA: Diagnosis not present

## 2017-01-28 DIAGNOSIS — J9611 Chronic respiratory failure with hypoxia: Secondary | ICD-10-CM | POA: Diagnosis not present

## 2017-01-28 DIAGNOSIS — I2609 Other pulmonary embolism with acute cor pulmonale: Secondary | ICD-10-CM | POA: Diagnosis not present

## 2017-01-28 DIAGNOSIS — I2510 Atherosclerotic heart disease of native coronary artery without angina pectoris: Secondary | ICD-10-CM | POA: Diagnosis not present

## 2017-01-28 DIAGNOSIS — R2681 Unsteadiness on feet: Secondary | ICD-10-CM | POA: Diagnosis not present

## 2017-01-28 DIAGNOSIS — R488 Other symbolic dysfunctions: Secondary | ICD-10-CM | POA: Diagnosis not present

## 2017-01-28 DIAGNOSIS — J9612 Chronic respiratory failure with hypercapnia: Secondary | ICD-10-CM | POA: Diagnosis not present

## 2017-01-28 DIAGNOSIS — Z9981 Dependence on supplemental oxygen: Secondary | ICD-10-CM | POA: Diagnosis not present

## 2017-01-28 DIAGNOSIS — D689 Coagulation defect, unspecified: Secondary | ICD-10-CM | POA: Diagnosis not present

## 2017-01-28 DIAGNOSIS — M6281 Muscle weakness (generalized): Secondary | ICD-10-CM | POA: Diagnosis not present

## 2017-01-28 DIAGNOSIS — R1312 Dysphagia, oropharyngeal phase: Secondary | ICD-10-CM | POA: Diagnosis not present

## 2017-01-28 DIAGNOSIS — D329 Benign neoplasm of meninges, unspecified: Secondary | ICD-10-CM | POA: Diagnosis not present

## 2017-01-28 DIAGNOSIS — J449 Chronic obstructive pulmonary disease, unspecified: Secondary | ICD-10-CM | POA: Diagnosis not present

## 2017-01-29 ENCOUNTER — Ambulatory Visit: Payer: Medicare Other | Admitting: Family Medicine

## 2017-01-29 ENCOUNTER — Ambulatory Visit (INDEPENDENT_AMBULATORY_CARE_PROVIDER_SITE_OTHER): Payer: Medicare Other | Admitting: Family Medicine

## 2017-01-29 VITALS — BP 132/70 | HR 87 | Temp 98.6°F | Ht 65.0 in | Wt 90.8 lb

## 2017-01-29 DIAGNOSIS — M25512 Pain in left shoulder: Secondary | ICD-10-CM

## 2017-01-29 DIAGNOSIS — M6281 Muscle weakness (generalized): Secondary | ICD-10-CM | POA: Diagnosis not present

## 2017-01-29 DIAGNOSIS — M79622 Pain in left upper arm: Secondary | ICD-10-CM

## 2017-01-29 DIAGNOSIS — D689 Coagulation defect, unspecified: Secondary | ICD-10-CM | POA: Diagnosis not present

## 2017-01-29 DIAGNOSIS — J449 Chronic obstructive pulmonary disease, unspecified: Secondary | ICD-10-CM | POA: Diagnosis not present

## 2017-01-29 DIAGNOSIS — J9612 Chronic respiratory failure with hypercapnia: Secondary | ICD-10-CM | POA: Diagnosis not present

## 2017-01-29 DIAGNOSIS — Z9981 Dependence on supplemental oxygen: Secondary | ICD-10-CM | POA: Diagnosis not present

## 2017-01-29 DIAGNOSIS — G8929 Other chronic pain: Secondary | ICD-10-CM

## 2017-01-29 DIAGNOSIS — J439 Emphysema, unspecified: Secondary | ICD-10-CM

## 2017-01-29 DIAGNOSIS — R54 Age-related physical debility: Secondary | ICD-10-CM

## 2017-01-29 DIAGNOSIS — R5381 Other malaise: Secondary | ICD-10-CM

## 2017-01-29 DIAGNOSIS — R1312 Dysphagia, oropharyngeal phase: Secondary | ICD-10-CM | POA: Diagnosis not present

## 2017-01-29 DIAGNOSIS — R2681 Unsteadiness on feet: Secondary | ICD-10-CM | POA: Diagnosis not present

## 2017-01-29 DIAGNOSIS — I2510 Atherosclerotic heart disease of native coronary artery without angina pectoris: Secondary | ICD-10-CM | POA: Diagnosis not present

## 2017-01-29 DIAGNOSIS — R488 Other symbolic dysfunctions: Secondary | ICD-10-CM | POA: Diagnosis not present

## 2017-01-29 DIAGNOSIS — I2609 Other pulmonary embolism with acute cor pulmonale: Secondary | ICD-10-CM | POA: Diagnosis not present

## 2017-01-29 DIAGNOSIS — I10 Essential (primary) hypertension: Secondary | ICD-10-CM | POA: Diagnosis not present

## 2017-01-29 DIAGNOSIS — D329 Benign neoplasm of meninges, unspecified: Secondary | ICD-10-CM | POA: Diagnosis not present

## 2017-01-29 DIAGNOSIS — J9611 Chronic respiratory failure with hypoxia: Secondary | ICD-10-CM | POA: Diagnosis not present

## 2017-01-29 NOTE — Progress Notes (Signed)
Philip at [facility] [address] [place], [place] [address] [phone]  Date:  01/29/2017   Name:  Holly Page   DOB:  1936/04/06   MRN:  [id number]  PCP:  Lamar Blinks, MD    Chief Complaint: Follow-up (Pt here for follow up viist and would like to discuss Lactobacillus. )   History of Present Illness:  Holly Page is a 81 y.o. very pleasant female patient who presents with the following:  Last seen by myself on 4/16 for follow-up after a hospital and rehab stay.  Here today to follow-up  History of anemia, PE, advanced COPD on oxygen, malnutrition  She has noted" little shooting pains in my legs" for several months- maybe up to a year.  Also has complaint of left shoulder pain for over a year.  She notes that it may hurt her at night  Wt Readings from Last 3 Encounters:  01/29/17 90 lb 12.8 oz (41.2 kg)  01/22/17 92 lb 12.8 oz (42.1 kg)  01/12/17 86 lb 9.6 oz (39.3 kg)   She is eating well- her appetite is fine.  She does not seem to gain weight however She has a question about her probiotics- she was not sure if she was still supposed to be taking this or not.  Advised her that she can continue these or not per her prefarance Her breathing is ok today- yesterday she felt like her breathing was more labored but back to normal today She is on 2L of oxygen 24/7.  She feels like she can breathe better and talk better than when she was in the hospital  No fevers No falls since she got home She has daily visits from various "nurses" who help her with her exercises Former smoker- quit approx 5 years ago  Patient Active Problem List   Diagnosis Date Noted  . Iron deficiency anemia due to chronic blood loss 01/23/2017  . 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 Pulmonary embolus ([place]) 12/30/2016  . Severe sepsis ([place]) 12/26/2016  . Influenza B 12/26/2016  . PE (pulmonary thromboembolism) ([place]) 12/26/2016  . Acute  urinary retention 12/26/2016  . Oropharyngeal dysphagia 12/26/2016  . Metabolic acidosis 25/36/6440  . Polyneuropathy 12/26/2016  . Arthritis   . Clotting disorder ([place])   . CAD (coronary artery disease)   . Fibroid   . Hypertension   . Kidney stone   . Protein-calorie malnutrition, severe 07/23/2016  . Shortness of breath 07/23/2016  . SOB (shortness of breath) 07/21/2016  . Acute on chronic respiratory failure with hypoxia and hypercapnia ([place]) 07/21/2016  . Chronic respiratory failure ([place]) 04/10/2016  . COPD with exacerbation ([place]) 03/19/2016  . RBC microcytosis 03/19/2016  . COPD with acute exacerbation ([place]) 03/19/2016  . Oxygen dependent 12/31/2015  . Anxiety 11/08/2015  . Hyperthyroidism 09/07/2015  . Malnutrition of moderate degree 08/31/2015  . Abnormal LFTs 03/19/2015  . Back pain 03/19/2015  . HTN (hypertension) 03/01/2015  . Anemia 12/19/2014  . Calculus of kidney 08/30/2014  . Hematuria 08/11/2014  . History of DVT (deep vein thrombosis)   . Neuralgia neuritis, sciatic nerve 05/22/2014  . 1st degree AV block 04/24/2014  . History of colon polyps 04/24/2014  . Adaptive colitis 04/24/2014  . APC (atrial premature contractions) 04/24/2014  . Benign neoplasm of meninges ([place]) 02/10/2014  . Barrett esophagus 02/03/2014  . Spinal stenosis 12/15/2013  . Allergic rhinitis 11/21/2013  . COPD with emphysema Gold D   .  GERD (gastroesophageal reflux disease)   . Lactose intolerance   . DDD (degenerative disc disease), lumbosacral 06/11/2010  . Diffuse cerebrovascular disease 06/11/2010    Past Medical History:  Diagnosis Date  . Anemia   . Arthritis   . CAD (coronary artery disease)   . Clotting disorder ([place])   . COPD (chronic obstructive pulmonary disease) ([place])   . DVT (deep venous thrombosis) ([place]) 1980s, recurrent 2015   Right DVT 2015  on xarelto  . Fibroid   . GERD (gastroesophageal reflux disease)   . Hypertension   . Hypertension   . Iron  deficiency anemia due to chronic blood loss 01/23/2017  . 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 Kidney stone   . Lactose intolerance   . Meningioma ([place])    followed by Dr Christella Noa  . Pneumonia   . Spinal stenosis     Past Surgical History:  Procedure Laterality Date  . CHOLECYSTECTOMY    . FOOT SURGERY    . INCISION AND DRAINAGE FOOT    . TONSILLECTOMY AND ADENOIDECTOMY    . TREATMENT FISTULA ANAL    . TUBAL LIGATION      Social History  Substance Use Topics  . Smoking status: Former Smoker    Packs/day: 1.50    Years: 56.00    Types: Cigarettes    Quit date: 04/29/2012  . Smokeless tobacco: Never Used     Comment: quit smoking 3 years ago  . Alcohol use No     Comment: quit drinking beer 2005    Family History  Problem Relation Age of Onset  . COPD Father     smoker deceased.   . Emphysema Father   . Lupus Sister   . Heart attack Son   . Heart disease Son   . Hypertension Son   . Hypertension Son     Allergies  Allergen Reactions  . Aspirin Other (See Comments)    Other reaction(s): PALPITATIONS Other reaction(s): DIFFICULTY BREATHING Heart flutter  . Incruse Ellipta [Umeclidinium Bromide] Shortness Of Breath  . Tramadol Shortness Of Breath  . Amlodipine Other (See Comments)    Other reaction(s): SWELLING  . Codeine Other (See Comments)    Hallucinations, can take Tylenol #3 now  . Doxycycline Other (See Comments)    Other reaction(s): NAUSEA,VOMITING  . Gabapentin Other (See Comments)    Other reaction(s): Other (See Comments) She could not swallow the large pill  . Hydrocodone Other (See Comments)    hallucinations  . Losartan Other (See Comments)    unknown  . Propoxyphene Nausea Only  . Tiotropium Itching and Rash    Medication list has been reviewed and updated.  Current Outpatient Prescriptions on File Prior to Visit  Medication Sig Dispense Refill  . acetaminophen-codeine (TYLENOL #3) 300-30 MG tablet Take 1 tablet by mouth every 6 (six) hours  as needed for moderate pain. 30 tablet 1  . Acidophilus Lactobacillus CAPS Take 1 capsule by mouth daily with breakfast.    . albuterol (PROVENTIL HFA;VENTOLIN HFA) 108 (90 Base) MCG/ACT inhaler Inhale 2 puffs into the lungs every 4 (four) hours as needed for wheezing or shortness of breath. 2 Inhaler 6  . albuterol (PROVENTIL) (2.5 MG/3ML) 0.083% nebulizer solution Take 3 mLs (2.5 mg total) by nebulization every 6 (six) hours as needed for wheezing or shortness of breath. 75 mL 3  . apixaban (ELIQUIS) 5 MG TABS tablet Take 1 tablet (5 mg total) by mouth 2 (two) times daily. 60 tablet 11  .  cholecalciferol (VITAMIN D) 1000 units tablet Take 1,000 Units by mouth daily.    . hydrALAZINE (APRESOLINE) 10 MG tablet Take 10 mg by mouth every 8 (eight) hours.    . Lactobacillus Acidophilus POWD Take by mouth.    . Multiple Vitamins-Minerals (MULTIVITAMIN & MINERAL PO) Take 1 tablet by mouth daily.    . Nutritional Supplements (ENSURE ENLIVE PO) Take 237 mLs by mouth 2 (two) times daily.    . OXYGEN Inhale 2 L into the lungs daily as needed. To maintain o2 saturation of 90%    . potassium chloride (K-DUR,KLOR-CON) 10 MEQ tablet Take 10 mEq by mouth daily. Patient takes 10 mEq once a day.    . ranitidine (ZANTAC) 300 MG capsule Take 1 capsule (300 mg total) by mouth every evening. 30 capsule 11  . verapamil (CALAN-SR) 240 MG CR tablet Take 1 tablet (240 mg total) by mouth daily. 30 tablet 11   No current facility-administered medications on file prior to visit.     Review of Systems:  As per HPI- otherwise negative.   Physical Examination: Vitals:   01/29/17 1012  BP: 132/70  Pulse: 87  Temp: 98.6 F (37 C)   Vitals:   01/29/17 1012  Weight: 90 lb 12.8 oz (41.2 kg)  Height: 5\' 5"  (1.651 m)   Body mass index is 15.11 kg/m. Ideal Body Weight: Weight in (lb) to have BMI = 25: 149.9  GEN: WDWN, NAD, Non-toxic, A & O x 3, severely underweight, sitting in WC, on oxygen via [place] HEENT:  Atraumatic, Normocephalic. Neck supple. No masses, No LAD. Ears and Nose: No external deformity. CV: RRR, No M/G/R. No JVD. No thrill. No extra heart sounds. PULM: CTA B, no wheezes, crackles, rhonchi. No retractions. No resp. distress. No accessory muscle use. EXTR: No c/c/e NEURO in [facility] PSYCH: Normally interactive. Conversant. Not depressed or anxious appearing.  Calm demeanor.  Left shoulder: she notes non-specific pain with ROM of the shoulder.  No redness or swelling.  symetrical weakness of the BUE due to her general medical condition   Assessment and Plan: Physical deconditioning  COPD, severe ([place])  Oxygen dependent  Left upper arm pain  Chronic left shoulder pain  Frailty  Pulmonary emphysema, unspecified emphysema type ([place])  Here today for a recheck visit She is mostly concerned about pain in her left shoulder; does not necessarily appreciate the severity of her overall health problems.  Had planned to order a left shoulder x-ray, but then she saw Dr. Nani Ravens for her left shoulder back in February and had x-rays which were benign. Offered to have her see orthopedics but she does not wish to see any other doctors at this time Encouraged her to try and gain weight to improve her strength Continue oxygen as usual She will see me next month for a recheck  Continue her eliquis for recent PE- she is seeing hematology also in about one month BP is under control  Signed Lamar Blinks, MD

## 2017-01-29 NOTE — Patient Instructions (Addendum)
It was good to see you today!  I am sorry that your shoulder is bothering you. When you regain some of your strength we can certainly look into this more for you Continue eating and try to gain some weight if you can I will see you next month- if you are feeling stronger we can try to work on your shoulder more As far as the lactobacillus- this is a pro-biotic, or "good bacteria" supplement.  You can certainly continue to take this but you do not have have to at this point

## 2017-01-30 ENCOUNTER — Telehealth: Payer: Self-pay | Admitting: Family Medicine

## 2017-01-30 ENCOUNTER — Encounter: Payer: Self-pay | Admitting: *Deleted

## 2017-01-30 ENCOUNTER — Other Ambulatory Visit: Payer: Self-pay | Admitting: *Deleted

## 2017-01-30 NOTE — Telephone Encounter (Signed)
Caller name: Arbie Cookey with [facility] Can be reached: provider line [phone]  Reason for call: Pt with [facility] for oxygen but they are going out of network. We can send to an alternate provider that will be in network such as [facility]. Provider needs to be changed over before June 1st when [facility] will be out of network.

## 2017-01-30 NOTE — Patient Outreach (Signed)
[facility]) Care Management  01/30/2017  PHYLICIA MCGAUGH 05-Apr-1936 [id number]   Based upon the unsuccessful contacts and outreach calls case will be closed via [facility] services. Will alert the social worker and primary provider of case closure.   [name], RN Care Management Coordinator [place] Office [phone]

## 2017-01-30 NOTE — Progress Notes (Signed)
Opened in error; Disregard.

## 2017-02-02 ENCOUNTER — Other Ambulatory Visit: Payer: Self-pay | Admitting: *Deleted

## 2017-02-02 ENCOUNTER — Telehealth: Payer: Self-pay | Admitting: *Deleted

## 2017-02-02 ENCOUNTER — Ambulatory Visit (HOSPITAL_BASED_OUTPATIENT_CLINIC_OR_DEPARTMENT_OTHER): Payer: Medicare Other

## 2017-02-02 ENCOUNTER — Encounter: Payer: Self-pay | Admitting: *Deleted

## 2017-02-02 ENCOUNTER — Telehealth: Payer: Self-pay

## 2017-02-02 DIAGNOSIS — J9611 Chronic respiratory failure with hypoxia: Secondary | ICD-10-CM | POA: Diagnosis not present

## 2017-02-02 DIAGNOSIS — J449 Chronic obstructive pulmonary disease, unspecified: Secondary | ICD-10-CM | POA: Diagnosis not present

## 2017-02-02 DIAGNOSIS — I2609 Other pulmonary embolism with acute cor pulmonale: Secondary | ICD-10-CM | POA: Diagnosis not present

## 2017-02-02 DIAGNOSIS — Z9981 Dependence on supplemental oxygen: Secondary | ICD-10-CM | POA: Diagnosis not present

## 2017-02-02 DIAGNOSIS — J9612 Chronic respiratory failure with hypercapnia: Secondary | ICD-10-CM | POA: Diagnosis not present

## 2017-02-02 DIAGNOSIS — R1312 Dysphagia, oropharyngeal phase: Secondary | ICD-10-CM | POA: Diagnosis not present

## 2017-02-02 DIAGNOSIS — R319 Hematuria, unspecified: Secondary | ICD-10-CM

## 2017-02-02 DIAGNOSIS — R2681 Unsteadiness on feet: Secondary | ICD-10-CM | POA: Diagnosis not present

## 2017-02-02 DIAGNOSIS — I10 Essential (primary) hypertension: Secondary | ICD-10-CM | POA: Diagnosis not present

## 2017-02-02 DIAGNOSIS — D689 Coagulation defect, unspecified: Secondary | ICD-10-CM | POA: Diagnosis not present

## 2017-02-02 DIAGNOSIS — R488 Other symbolic dysfunctions: Secondary | ICD-10-CM | POA: Diagnosis not present

## 2017-02-02 DIAGNOSIS — D329 Benign neoplasm of meninges, unspecified: Secondary | ICD-10-CM | POA: Diagnosis not present

## 2017-02-02 DIAGNOSIS — M6281 Muscle weakness (generalized): Secondary | ICD-10-CM | POA: Diagnosis not present

## 2017-02-02 DIAGNOSIS — I2510 Atherosclerotic heart disease of native coronary artery without angina pectoris: Secondary | ICD-10-CM | POA: Diagnosis not present

## 2017-02-02 LAB — URINALYSIS, MICROSCOPIC (CHCC SATELLITE)
BILIRUBIN (URINE): NEGATIVE
Glucose: NEGATIVE mg/dL
Ketones: NEGATIVE mg/dL
NITRITE: NEGATIVE
Protein: 100 mg/dL
Specific Gravity, Urine: 1.015 (ref 1.003–1.035)
Urobilinogen, UR: 0.2 mg/dL (ref 0.2–1)
pH: 8 (ref 4.60–8.00)

## 2017-02-02 MED ORDER — AMOXICILLIN-POT CLAVULANATE 875-125 MG PO TABS: 1.0000 | ORAL_TABLET | Freq: Two times a day (BID) | ORAL | 0 refills | Status: DC

## 2017-02-02 NOTE — Patient Outreach (Signed)
[facility]) Care Management  02/02/2017  Holly Page 04-Jul-1936 [id number]   CSW received an In Basket message from patient's RNCM with [facility], Holly Page indicating that she is closing patient's case on behalf of herself and CSW, due to inability to establish/maintain phone contact with patient.  [name] agreed to mail a case closure letter to patient's Primary Care Physician, Holly Page, as well as notify Care Management Assistant, Holly Page. Holly Page, BSW, MSW, LCSW  Licensed Education officer, [facility]  Mailing [place] [address] [place], [place] [address] Physical Address-[address], [place], [place] [address] Toll Free Main # [phone] Fax # [phone] Cell # [phone]  Office # [phone] [email or web address]

## 2017-02-02 NOTE — Telephone Encounter (Signed)
Patient c/o hematuria since Thursday. She is on Eliquis.  Spoke to Dr Marin Olp. He would like patient to come in for a UA.   Patient will come in today and provide a urine specimen.

## 2017-02-02 NOTE — Telephone Encounter (Signed)
Pt called with c/o hematuria. This RN left voice message with Dr Antonieta Pert nurse.

## 2017-02-03 ENCOUNTER — Telehealth: Payer: Self-pay | Admitting: *Deleted

## 2017-02-03 DIAGNOSIS — R2681 Unsteadiness on feet: Secondary | ICD-10-CM | POA: Diagnosis not present

## 2017-02-03 DIAGNOSIS — Z9981 Dependence on supplemental oxygen: Secondary | ICD-10-CM | POA: Diagnosis not present

## 2017-02-03 DIAGNOSIS — J9612 Chronic respiratory failure with hypercapnia: Secondary | ICD-10-CM | POA: Diagnosis not present

## 2017-02-03 DIAGNOSIS — D329 Benign neoplasm of meninges, unspecified: Secondary | ICD-10-CM | POA: Diagnosis not present

## 2017-02-03 DIAGNOSIS — J449 Chronic obstructive pulmonary disease, unspecified: Secondary | ICD-10-CM | POA: Diagnosis not present

## 2017-02-03 DIAGNOSIS — J9611 Chronic respiratory failure with hypoxia: Secondary | ICD-10-CM | POA: Diagnosis not present

## 2017-02-03 DIAGNOSIS — D689 Coagulation defect, unspecified: Secondary | ICD-10-CM | POA: Diagnosis not present

## 2017-02-03 DIAGNOSIS — I10 Essential (primary) hypertension: Secondary | ICD-10-CM | POA: Diagnosis not present

## 2017-02-03 DIAGNOSIS — M6281 Muscle weakness (generalized): Secondary | ICD-10-CM | POA: Diagnosis not present

## 2017-02-03 DIAGNOSIS — R1312 Dysphagia, oropharyngeal phase: Secondary | ICD-10-CM | POA: Diagnosis not present

## 2017-02-03 DIAGNOSIS — I2510 Atherosclerotic heart disease of native coronary artery without angina pectoris: Secondary | ICD-10-CM | POA: Diagnosis not present

## 2017-02-03 DIAGNOSIS — R488 Other symbolic dysfunctions: Secondary | ICD-10-CM | POA: Diagnosis not present

## 2017-02-03 DIAGNOSIS — I2609 Other pulmonary embolism with acute cor pulmonale: Secondary | ICD-10-CM | POA: Diagnosis not present

## 2017-02-03 LAB — URINE CULTURE

## 2017-02-03 NOTE — Telephone Encounter (Signed)
Patient was called to schedule feraheme infusion. She is very confused at why she would be prescribed this treatment. She states no one has ever talked to her about this. She doesn't like needles and is upset that this is the treatment option that was chosen. Attempted to answer all patient's questions, however she states she needs to speak to a provider.  Feraheme infusion will be held until she can speak to the provider at her next appointment on June 8th. Patient is comfortable with this plan.

## 2017-02-03 NOTE — Telephone Encounter (Signed)
Called- the agent I spoke with really could not help me.  They suggested that we have [place] call member services and find out what needs to be done

## 2017-02-04 DIAGNOSIS — M6281 Muscle weakness (generalized): Secondary | ICD-10-CM | POA: Diagnosis not present

## 2017-02-04 DIAGNOSIS — I2510 Atherosclerotic heart disease of native coronary artery without angina pectoris: Secondary | ICD-10-CM | POA: Diagnosis not present

## 2017-02-04 DIAGNOSIS — J449 Chronic obstructive pulmonary disease, unspecified: Secondary | ICD-10-CM | POA: Diagnosis not present

## 2017-02-04 DIAGNOSIS — J9611 Chronic respiratory failure with hypoxia: Secondary | ICD-10-CM | POA: Diagnosis not present

## 2017-02-04 DIAGNOSIS — J9612 Chronic respiratory failure with hypercapnia: Secondary | ICD-10-CM | POA: Diagnosis not present

## 2017-02-04 DIAGNOSIS — D329 Benign neoplasm of meninges, unspecified: Secondary | ICD-10-CM | POA: Diagnosis not present

## 2017-02-04 DIAGNOSIS — R2681 Unsteadiness on feet: Secondary | ICD-10-CM | POA: Diagnosis not present

## 2017-02-04 DIAGNOSIS — R488 Other symbolic dysfunctions: Secondary | ICD-10-CM | POA: Diagnosis not present

## 2017-02-04 DIAGNOSIS — I10 Essential (primary) hypertension: Secondary | ICD-10-CM | POA: Diagnosis not present

## 2017-02-04 DIAGNOSIS — I2609 Other pulmonary embolism with acute cor pulmonale: Secondary | ICD-10-CM | POA: Diagnosis not present

## 2017-02-04 DIAGNOSIS — Z9981 Dependence on supplemental oxygen: Secondary | ICD-10-CM | POA: Diagnosis not present

## 2017-02-04 DIAGNOSIS — D689 Coagulation defect, unspecified: Secondary | ICD-10-CM | POA: Diagnosis not present

## 2017-02-04 DIAGNOSIS — R1312 Dysphagia, oropharyngeal phase: Secondary | ICD-10-CM | POA: Diagnosis not present

## 2017-02-05 ENCOUNTER — Ambulatory Visit: Payer: Self-pay | Admitting: *Deleted

## 2017-02-05 ENCOUNTER — Telehealth: Payer: Self-pay | Admitting: *Deleted

## 2017-02-05 NOTE — Telephone Encounter (Signed)
Patient notifying the office that she is still having bloody urine. She was seen here Monday and treatment was initiated for a UTI. A urine culture has since come back negative for UTI. Patient has a history of hematuria with kidney stones and is under the care of a urologist.  Spoke with Dr Marin Olp. He wants patient to continue to well hydrate and finish the antibiotics he prescribed on Monday. He DOES NOT want the patient to stop the Eliquis. He also wants the patient to follow up with urology since she has a hematuria history with them.   Patient understands to call them to see if they have any further treatment suggestions.

## 2017-02-06 ENCOUNTER — Telehealth: Payer: Self-pay | Admitting: Family Medicine

## 2017-02-06 NOTE — Telephone Encounter (Signed)
Caller name: Romelle Starcher Relationship to patient: [place] Can be reached: [phone] Pharmacy:  Reason for call: Called to inform provider that patient cancelled her PT today because she is moving back into her residence from her son's house.

## 2017-02-08 ENCOUNTER — Encounter: Payer: Self-pay | Admitting: Internal Medicine

## 2017-02-08 DIAGNOSIS — I2510 Atherosclerotic heart disease of native coronary artery without angina pectoris: Secondary | ICD-10-CM | POA: Diagnosis not present

## 2017-02-08 DIAGNOSIS — Z79899 Other long term (current) drug therapy: Secondary | ICD-10-CM | POA: Diagnosis not present

## 2017-02-08 DIAGNOSIS — J9602 Acute respiratory failure with hypercapnia: Secondary | ICD-10-CM | POA: Diagnosis not present

## 2017-02-08 DIAGNOSIS — N200 Calculus of kidney: Secondary | ICD-10-CM | POA: Diagnosis not present

## 2017-02-08 DIAGNOSIS — Z780 Asymptomatic menopausal state: Secondary | ICD-10-CM | POA: Diagnosis not present

## 2017-02-08 DIAGNOSIS — J9612 Chronic respiratory failure with hypercapnia: Secondary | ICD-10-CM | POA: Diagnosis not present

## 2017-02-08 DIAGNOSIS — K219 Gastro-esophageal reflux disease without esophagitis: Secondary | ICD-10-CM | POA: Diagnosis not present

## 2017-02-08 DIAGNOSIS — R0602 Shortness of breath: Secondary | ICD-10-CM | POA: Diagnosis not present

## 2017-02-08 DIAGNOSIS — M1990 Unspecified osteoarthritis, unspecified site: Secondary | ICD-10-CM | POA: Diagnosis not present

## 2017-02-08 DIAGNOSIS — I10 Essential (primary) hypertension: Secondary | ICD-10-CM | POA: Diagnosis not present

## 2017-02-08 DIAGNOSIS — R319 Hematuria, unspecified: Secondary | ICD-10-CM | POA: Diagnosis not present

## 2017-02-08 DIAGNOSIS — J189 Pneumonia, unspecified organism: Secondary | ICD-10-CM | POA: Diagnosis not present

## 2017-02-08 DIAGNOSIS — Z9981 Dependence on supplemental oxygen: Secondary | ICD-10-CM | POA: Diagnosis not present

## 2017-02-08 DIAGNOSIS — J9610 Chronic respiratory failure, unspecified whether with hypoxia or hypercapnia: Secondary | ICD-10-CM | POA: Diagnosis not present

## 2017-02-08 DIAGNOSIS — Z87891 Personal history of nicotine dependence: Secondary | ICD-10-CM | POA: Diagnosis not present

## 2017-02-08 DIAGNOSIS — N281 Cyst of kidney, acquired: Secondary | ICD-10-CM | POA: Diagnosis not present

## 2017-02-08 DIAGNOSIS — E43 Unspecified severe protein-calorie malnutrition: Secondary | ICD-10-CM | POA: Diagnosis not present

## 2017-02-08 DIAGNOSIS — R310 Gross hematuria: Secondary | ICD-10-CM | POA: Diagnosis not present

## 2017-02-08 DIAGNOSIS — J9611 Chronic respiratory failure with hypoxia: Secondary | ICD-10-CM | POA: Diagnosis not present

## 2017-02-08 DIAGNOSIS — J9601 Acute respiratory failure with hypoxia: Secondary | ICD-10-CM | POA: Diagnosis not present

## 2017-02-08 DIAGNOSIS — J440 Chronic obstructive pulmonary disease with acute lower respiratory infection: Secondary | ICD-10-CM | POA: Diagnosis not present

## 2017-02-08 DIAGNOSIS — J181 Lobar pneumonia, unspecified organism: Secondary | ICD-10-CM | POA: Diagnosis not present

## 2017-02-08 DIAGNOSIS — J441 Chronic obstructive pulmonary disease with (acute) exacerbation: Secondary | ICD-10-CM | POA: Diagnosis not present

## 2017-02-08 DIAGNOSIS — R079 Chest pain, unspecified: Secondary | ICD-10-CM | POA: Diagnosis not present

## 2017-02-08 DIAGNOSIS — Z7901 Long term (current) use of anticoagulants: Secondary | ICD-10-CM | POA: Diagnosis not present

## 2017-02-10 NOTE — Telephone Encounter (Signed)
Called Holly Page and left detailed message. Please call [employer] and speak with member services about her oxygen provider.  I tried to do this for her but was not able to take care of it.

## 2017-02-11 ENCOUNTER — Other Ambulatory Visit: Payer: Self-pay | Admitting: Emergency Medicine

## 2017-02-11 DIAGNOSIS — J449 Chronic obstructive pulmonary disease, unspecified: Secondary | ICD-10-CM

## 2017-02-11 NOTE — Telephone Encounter (Signed)
Patient returned call and stated please call her back at [phone]

## 2017-02-11 NOTE — Telephone Encounter (Signed)
Called pt back. Pt requesting refill on nebulizer solution. Called pharmacy for pt because we show in the system that she should have refills. [facility] been [facility] and is ready for pt to pick up. Pt informed and verbalized understanding.

## 2017-02-12 ENCOUNTER — Other Ambulatory Visit: Payer: Self-pay | Admitting: Emergency Medicine

## 2017-02-12 ENCOUNTER — Telehealth: Payer: Self-pay | Admitting: Family Medicine

## 2017-02-12 DIAGNOSIS — J449 Chronic obstructive pulmonary disease, unspecified: Secondary | ICD-10-CM

## 2017-02-12 MED ORDER — ALBUTEROL SULFATE (2.5 MG/3ML) 0.083% IN NEBU: 2.5000 mg | INHALATION_SOLUTION | Freq: Four times a day (QID) | RESPIRATORY_TRACT | 0 refills | Status: DC | PRN

## 2017-02-12 NOTE — Telephone Encounter (Signed)
Called pharmacy to authorize filling 5 boxes of 25 per pt's request. Per pharmacy since pt just picked up a prescription she will need to wait at least 5 days before picking up the new refills. Called pt to info that refill for additional boxes have been sent to the pharmacy but per the pharmacy she will have to wait at least 5 days to pick up the refills that I just sent over so that her insurance will pay for it.   Pt verbalized understanding.

## 2017-02-12 NOTE — Telephone Encounter (Signed)
Caller name: Hartley Wyke Relationship to patient: self Can be reached: [phone] [id number] [place] [place] [address] [facility] [address]  Reason for call: Pt called stating she just picked up albuteral nebulizer meds from pharmacy and only got 1 box of 25. Pt normally gets 5 boxes of 25 per month as she uses neb machine 3-4 times per day. Pt requesting we send in updated RX for meds.

## 2017-02-12 NOTE — Telephone Encounter (Signed)
Opened in error

## 2017-02-16 DIAGNOSIS — J449 Chronic obstructive pulmonary disease, unspecified: Secondary | ICD-10-CM | POA: Diagnosis not present

## 2017-02-16 DIAGNOSIS — D329 Benign neoplasm of meninges, unspecified: Secondary | ICD-10-CM | POA: Diagnosis not present

## 2017-02-16 DIAGNOSIS — I2510 Atherosclerotic heart disease of native coronary artery without angina pectoris: Secondary | ICD-10-CM | POA: Diagnosis not present

## 2017-02-16 DIAGNOSIS — J9612 Chronic respiratory failure with hypercapnia: Secondary | ICD-10-CM | POA: Diagnosis not present

## 2017-02-16 DIAGNOSIS — R1312 Dysphagia, oropharyngeal phase: Secondary | ICD-10-CM | POA: Diagnosis not present

## 2017-02-16 DIAGNOSIS — M6281 Muscle weakness (generalized): Secondary | ICD-10-CM | POA: Diagnosis not present

## 2017-02-16 DIAGNOSIS — D689 Coagulation defect, unspecified: Secondary | ICD-10-CM | POA: Diagnosis not present

## 2017-02-16 DIAGNOSIS — I10 Essential (primary) hypertension: Secondary | ICD-10-CM | POA: Diagnosis not present

## 2017-02-16 DIAGNOSIS — R488 Other symbolic dysfunctions: Secondary | ICD-10-CM | POA: Diagnosis not present

## 2017-02-16 DIAGNOSIS — J9611 Chronic respiratory failure with hypoxia: Secondary | ICD-10-CM | POA: Diagnosis not present

## 2017-02-16 DIAGNOSIS — I2609 Other pulmonary embolism with acute cor pulmonale: Secondary | ICD-10-CM | POA: Diagnosis not present

## 2017-02-16 DIAGNOSIS — Z9981 Dependence on supplemental oxygen: Secondary | ICD-10-CM | POA: Diagnosis not present

## 2017-02-16 DIAGNOSIS — R2681 Unsteadiness on feet: Secondary | ICD-10-CM | POA: Diagnosis not present

## 2017-02-17 ENCOUNTER — Telehealth: Payer: Self-pay | Admitting: Family Medicine

## 2017-02-17 DIAGNOSIS — R3129 Other microscopic hematuria: Secondary | ICD-10-CM | POA: Diagnosis not present

## 2017-02-17 NOTE — Telephone Encounter (Signed)
Patient Name: Holly Page  DOB: May 10, 1936    Initial Comment Caller states her BP is 75/98.    Nurse Assessment  Nurse: Julien Girt, RN, Almyra Free Date/Time Eilene Ghazi Time): 02/17/2017 11:01:13 AM  Confirm and document reason for call. If symptomatic, describe symptoms. ---Caller states her BP was 75/98. I ask to recheck, is 123/71. Adds she was hospitalized last week for PE/COPD.  Does the patient have any new or worsening symptoms? ---Yes  Will a triage be completed? ---Yes  Related visit to physician within the last 2 weeks? ---No  Does the PT have any chronic conditions? (i.e. diabetes, asthma, etc.) ---Yes  List chronic conditions. ---Htn, PE, CVD/CAD , GERD , Hypothyroid, Neuropathy, Kidney Stone  Is this a behavioral health or substance abuse call? ---No     Guidelines    Guideline Title Affirmed Question Affirmed Notes  Low Blood Pressure [6] Fall in systolic BP > 20 mm Hg from normal AND [2] NOT dizzy, lightheaded, or weak    Final Disposition User   See PCP When Office is Open (within 3 days) Julien Girt, RN, Almyra Free    Referrals  REFERRED TO PCP OFFICE   Disagree/Comply: Comply   I had a very hard time triaging caller, states she wants to be seen today but ONLY by Dr. Edilia Bo. Advised that no appts with Dr. Edilia Bo today or tomorrow. States she needs to know whether to take her BP meds.

## 2017-02-17 NOTE — Telephone Encounter (Signed)
Patient scheduled for BP check with Dr. Lorelei Pont for 02/19/17. Triage nurse advised for RN from the office to call patient to discuss BP medication, please advise

## 2017-02-17 NOTE — Telephone Encounter (Signed)
Ok to hold bp med since bp low

## 2017-02-17 NOTE — Telephone Encounter (Signed)
Called patient to review meds.  She is taking only verapamil 240 mg daily for BP. She has held med for the last 2 days.  She was recently discharged from [facility] for PNA.  HFU appt is scheduled tomorrow w/ PCP.  Please advise if patient should take BP medication today or hold until appt (see BP readings below).

## 2017-02-17 NOTE — Telephone Encounter (Addendum)
Patient has appointment scheduled for tomorrow regarding drop in BP per [facility].

## 2017-02-17 NOTE — Telephone Encounter (Signed)
Pt notified of instructions and verbalized understanding. 

## 2017-02-18 ENCOUNTER — Ambulatory Visit (INDEPENDENT_AMBULATORY_CARE_PROVIDER_SITE_OTHER): Payer: Medicare Other | Admitting: Family Medicine

## 2017-02-18 ENCOUNTER — Telehealth: Payer: Self-pay | Admitting: Family Medicine

## 2017-02-18 VITALS — BP 132/82 | HR 90 | Temp 98.4°F | Ht 65.0 in | Wt 90.4 lb

## 2017-02-18 DIAGNOSIS — Z09 Encounter for follow-up examination after completed treatment for conditions other than malignant neoplasm: Secondary | ICD-10-CM

## 2017-02-18 DIAGNOSIS — R5381 Other malaise: Secondary | ICD-10-CM

## 2017-02-18 DIAGNOSIS — J189 Pneumonia, unspecified organism: Secondary | ICD-10-CM | POA: Diagnosis not present

## 2017-02-18 DIAGNOSIS — M79622 Pain in left upper arm: Secondary | ICD-10-CM

## 2017-02-18 DIAGNOSIS — J449 Chronic obstructive pulmonary disease, unspecified: Secondary | ICD-10-CM

## 2017-02-18 DIAGNOSIS — Z9981 Dependence on supplemental oxygen: Secondary | ICD-10-CM | POA: Diagnosis not present

## 2017-02-18 NOTE — Progress Notes (Signed)
Holly Page at [facility] [address], [place], [place] [address] [phone]  Date:  02/18/2017   Name:  Holly Page   DOB:  1936-06-16   MRN:  [id number]  PCP:  Darreld Mclean, MD    Chief Complaint: Hospitalization Follow-up (Pt here for hosp f/u from 02/08/17 for pneumonia. )   History of Present Illness:  Holly Page is a 81 y.o. very pleasant female patient who presents with the following:  She was admitted to [facility] from 5/13 to 5/15 with COPD exacerbation and pneumonia  She presented with SOB and chest pain.  She was treated with IV steroids, abx and breathing treatments She is done with her abx and oral steroid at this time She has not noted any fever- she will still feel hot and cold however  Wt Readings from Last 3 Encounters:  02/18/17 90 lb 6.4 oz (41 kg)  01/29/17 90 lb 12.8 oz (41.2 kg)  01/22/17 92 lb 12.8 oz (42.1 kg)   She does get meals on wheels once a day.  Otherwise she notes that she has a hard time getting meals that appeal to her Blood in her urine has resolved and she is back on her eliquis  She did see Dr. Junious Silk yesterday-urology.  For the time being her microhematuria is clear and she is ok to be back on blood thinners  She notes that her left shoulder will throb at night, and that she will have pain into her left humerus, elbow and wrist.  We did x-rays of her shoulder earlier this year which did not reveal any cause of her pain Despite her other very serious health problems, her shoulder pain seems to be her main concern  Pulse Readings from Last 3 Encounters:  02/18/17 (!) 105  01/29/17 87  01/22/17 73   BP Readings from Last 3 Encounters:  02/18/17 132/82  01/29/17 132/70  01/22/17 (!) 103/49   She is currently using a metal, green oxygen tank which is really too heavy for her to maneuver.  She would like for me to get her a different kind of oxygen tank from [facility]  Patient Active Problem List   Diagnosis Date Noted  . PNA (pneumonia) 02/08/2017  . Iron deficiency anemia due to chronic blood loss 01/23/2017  . 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 Pulmonary embolus ([place]) 12/30/2016  . Severe sepsis ([place]) 12/26/2016  . Influenza B 12/26/2016  . PE (pulmonary thromboembolism) ([place]) 12/26/2016  . Acute urinary retention 12/26/2016  . Oropharyngeal dysphagia 12/26/2016  . Metabolic acidosis 50/05/3817  . Polyneuropathy 12/26/2016  . Arthritis   . Clotting disorder ([place])   . CAD (coronary artery disease)   . Fibroid   . Hypertension   . Kidney stone   . Protein-calorie malnutrition, severe 07/23/2016  . Shortness of breath 07/23/2016  . SOB (shortness of breath) 07/21/2016  . Acute on chronic respiratory failure with hypoxia and hypercapnia ([place]) 07/21/2016  . Chronic respiratory failure ([place]) 04/10/2016  . COPD with exacerbation ([place]) 03/19/2016  . RBC microcytosis 03/19/2016  . COPD with acute exacerbation ([place]) 03/19/2016  . Oxygen dependent 12/31/2015  . Anxiety 11/08/2015  . Hyperthyroidism 09/07/2015  . Malnutrition of moderate degree 08/31/2015  . Abnormal LFTs 03/19/2015  . Back pain 03/19/2015  . HTN (hypertension) 03/01/2015  . Anemia 12/19/2014  . Calculus of kidney 08/30/2014  . Hematuria 08/11/2014  . History of DVT (deep  vein thrombosis)   . Neuralgia neuritis, sciatic nerve 05/22/2014  . 1st degree AV block 04/24/2014  . History of colon polyps 04/24/2014  . Adaptive colitis 04/24/2014  . APC (atrial premature contractions) 04/24/2014  . Benign neoplasm of meninges ([place]) 02/10/2014  . Barrett esophagus 02/03/2014  . Spinal stenosis 12/15/2013  . Allergic rhinitis 11/21/2013  . COPD with emphysema Gold D   . GERD (gastroesophageal reflux disease)   . Lactose intolerance   . DDD (degenerative disc disease), lumbosacral 06/11/2010  . Diffuse cerebrovascular disease 06/11/2010    Past Medical History:  Diagnosis  Date  . Anemia   . Arthritis   . CAD (coronary artery disease)   . Clotting disorder ([place])   . COPD (chronic obstructive pulmonary disease) ([place])   . DVT (deep venous thrombosis) ([place]) 1980s, recurrent 2015   Right DVT 2015  on xarelto  . Fibroid   . GERD (gastroesophageal reflux disease)   . Hypertension   . Hypertension   . Iron deficiency anemia due to chronic blood loss 01/23/2017  . 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 Kidney stone   . Lactose intolerance   . Meningioma ([place])    followed by Dr Christella Noa  . Pneumonia   . Spinal stenosis     Past Surgical History:  Procedure Laterality Date  . CHOLECYSTECTOMY    . FOOT SURGERY    . INCISION AND DRAINAGE FOOT    . TONSILLECTOMY AND ADENOIDECTOMY    . TREATMENT FISTULA ANAL    . TUBAL LIGATION      Social History  Substance Use Topics  . Smoking status: Former Smoker    Packs/day: 1.50    Years: 56.00    Types: Cigarettes    Quit date: 04/29/2012  . Smokeless tobacco: Never Used     Comment: quit smoking 3 years ago  . Alcohol use No     Comment: quit drinking beer 2005    Family History  Problem Relation Age of Onset  . COPD Father        smoker deceased.   . Emphysema Father   . Lupus Sister   . Heart attack Son   . Heart disease Son   . Hypertension Son   . Hypertension Son     Allergies  Allergen Reactions  . Aspirin Other (See Comments)    Other reaction(s): PALPITATIONS Other reaction(s): DIFFICULTY BREATHING Heart flutter  . Incruse Ellipta [Umeclidinium Bromide] Shortness Of Breath  . Tramadol Shortness Of Breath  . Amlodipine Other (See Comments)    Other reaction(s): SWELLING  . Codeine Other (See Comments)    Hallucinations, can take Tylenol #3 now  . Doxycycline Other (See Comments)    Other reaction(s): NAUSEA,VOMITING  . Gabapentin Other (See Comments)    Other reaction(s): Other (See Comments) She could not swallow the large pill  . Hydrocodone Other (See Comments)    hallucinations  .  Losartan Other (See Comments)    unknown  . Propoxyphene Nausea Only  . Tiotropium Itching and Rash    Medication list has been reviewed and updated.  Current Outpatient Prescriptions on File Prior to Visit  Medication Sig Dispense Refill  . acetaminophen-codeine (TYLENOL #3) 300-30 MG tablet Take 1 tablet by mouth every 6 (six) hours as needed for moderate pain. 30 tablet 1  . Acidophilus Lactobacillus CAPS Take 1 capsule by mouth daily with breakfast.    . albuterol (PROVENTIL HFA;VENTOLIN HFA) 108 (90 Base) MCG/ACT inhaler Inhale 2 puffs into the  lungs every 4 (four) hours as needed for wheezing or shortness of breath. 2 Inhaler 6  . albuterol (PROVENTIL) (2.5 MG/3ML) 0.083% nebulizer solution Take 3 mLs (2.5 mg total) by nebulization every 6 (six) hours as needed for wheezing or shortness of breath. 375 mL 0  . apixaban (ELIQUIS) 5 MG TABS tablet Take 1 tablet (5 mg total) by mouth 2 (two) times daily. 60 tablet 11  . cholecalciferol (VITAMIN D) 1000 units tablet Take 1,000 Units by mouth daily.    . hydrALAZINE (APRESOLINE) 10 MG tablet Take 10 mg by mouth every 8 (eight) hours.    . Lactobacillus Acidophilus POWD Take by mouth.    . Multiple Vitamins-Minerals (MULTIVITAMIN & MINERAL PO) Take 1 tablet by mouth daily.    . Nutritional Supplements (ENSURE ENLIVE PO) Take 237 mLs by mouth 2 (two) times daily.    . OXYGEN Inhale 2 L into the lungs daily as needed. To maintain o2 saturation of 90%    . potassium chloride (K-DUR,KLOR-CON) 10 MEQ tablet Take 10 mEq by mouth daily. Patient takes 10 mEq once a day.    . ranitidine (ZANTAC) 300 MG capsule Take 1 capsule (300 mg total) by mouth every evening. 30 capsule 11  . verapamil (CALAN-SR) 240 MG CR tablet Take 1 tablet (240 mg total) by mouth daily. 30 tablet 11   No current facility-administered medications on file prior to visit.     Review of Systems:  As per HPI- otherwise negative.   Physical Examination: Vitals:   02/18/17  1137  BP: 132/82  Pulse: (!) 105  Temp: 98.4 F (36.9 C)   Vitals:   02/18/17 1137  Weight: 90 lb 6.4 oz (41 kg)  Height: 5\' 5"  (1.651 m)   Body mass index is 15.04 kg/m. Ideal Body Weight: Weight in (lb) to have BMI = 25: 149.9  GEN: NAD, Non-toxic, A & O x 3, using 2 liters of oxygen via [place] right now.  cachectic HEENT: Atraumatic, Normocephalic. Neck supple. No masses, No LAD. Ears and Nose: No external deformity. CV: RRR, No M/G/R. No JVD. No thrill. No extra heart sounds. PULM: CTA B, mild wheezes, no crackles, no rhonchi. No retractions. No resp. distress. No accessory muscle use. EXTR: No c/c/e NEURO sitting in WC.  Very underweight and frail  She indicates pain in her entire left arm.  However I cannot reproduce this pain with ROM of the shoulder, elbow or wrist joins or with palpation of the arm  PSYCH: Normally interactive. Conversant. Not depressed or anxious appearing.  Calm demeanor.    Assessment and Plan: COPD, severe ([place])  Physical deconditioning  Oxygen dependent  Left upper arm pain - Plan: Ambulatory referral to Orthopedic Surgery  Community acquired pneumonia, unspecified laterality  Hospital discharge follow-up  Here today to follow-up from a recent admission for community acquired pneumonia.  She is overall improved.  Complaining about her left arm pain still- I cannot determien a cause for her pain.  Will have her see orthopedics per her request Discussed need to improve her nutritional status She needs a follow-up CXR but too soon today She has an appt to see me in about 2 weeks- will repeat her CXR at that visit  Signed Lamar Blinks, MD

## 2017-02-18 NOTE — Patient Instructions (Addendum)
I am glad that you are doing better from your recent pneumonia I will refer you to see an orthopedist about your left arm pain I will also contact [facility] regarding your oxygen and try to get you a smaller, lighter tank  Please come and see me in 3 weeks so we can check on how you are doing and repeat your chest x-ray

## 2017-02-18 NOTE — Telephone Encounter (Signed)
Caller name: Olin Hauser Relationship to patient: Reader Can be reached: [phone] Pharmacy:  Reason for call: Need verbal order to continue home health nursing following hospital stay.

## 2017-02-19 ENCOUNTER — Ambulatory Visit: Payer: Medicare Other | Admitting: Family Medicine

## 2017-02-19 ENCOUNTER — Telehealth: Payer: Self-pay | Admitting: Family Medicine

## 2017-02-19 ENCOUNTER — Inpatient Hospital Stay: Payer: Medicare Other | Admitting: Medical

## 2017-02-19 DIAGNOSIS — M6281 Muscle weakness (generalized): Secondary | ICD-10-CM | POA: Diagnosis not present

## 2017-02-19 DIAGNOSIS — D689 Coagulation defect, unspecified: Secondary | ICD-10-CM | POA: Diagnosis not present

## 2017-02-19 DIAGNOSIS — J449 Chronic obstructive pulmonary disease, unspecified: Secondary | ICD-10-CM | POA: Diagnosis not present

## 2017-02-19 DIAGNOSIS — I10 Essential (primary) hypertension: Secondary | ICD-10-CM | POA: Diagnosis not present

## 2017-02-19 DIAGNOSIS — D329 Benign neoplasm of meninges, unspecified: Secondary | ICD-10-CM | POA: Diagnosis not present

## 2017-02-19 DIAGNOSIS — J9612 Chronic respiratory failure with hypercapnia: Secondary | ICD-10-CM | POA: Diagnosis not present

## 2017-02-19 DIAGNOSIS — R1312 Dysphagia, oropharyngeal phase: Secondary | ICD-10-CM | POA: Diagnosis not present

## 2017-02-19 DIAGNOSIS — I2510 Atherosclerotic heart disease of native coronary artery without angina pectoris: Secondary | ICD-10-CM | POA: Diagnosis not present

## 2017-02-19 DIAGNOSIS — R2681 Unsteadiness on feet: Secondary | ICD-10-CM | POA: Diagnosis not present

## 2017-02-19 DIAGNOSIS — J9611 Chronic respiratory failure with hypoxia: Secondary | ICD-10-CM | POA: Diagnosis not present

## 2017-02-19 DIAGNOSIS — R488 Other symbolic dysfunctions: Secondary | ICD-10-CM | POA: Diagnosis not present

## 2017-02-19 DIAGNOSIS — I2609 Other pulmonary embolism with acute cor pulmonale: Secondary | ICD-10-CM | POA: Diagnosis not present

## 2017-02-19 DIAGNOSIS — Z9981 Dependence on supplemental oxygen: Secondary | ICD-10-CM | POA: Diagnosis not present

## 2017-02-19 NOTE — Telephone Encounter (Signed)
Referral faxed to [facility], awaiting appt

## 2017-02-19 NOTE — Telephone Encounter (Signed)
[facility] regarding her oxygen - she got the big green tank because she needed continuous O2 flow and the other smaller tank is a "puffer" oxygen delivery system.  The order for continuous O2 was given during her recent stay at [facility] with pneumonia   Called her back about her BP concerns- her BP yesterday was fine.  Today his SBP was in the 120. She has been holding her verapamil for the last few days and was not sure if she should go back on this.  Advised her that as long as her BP is low she can continue to hold this medication.  Also explained issue with her oxygen above.  She states that she still has the puffer tank at home and plans to switch back to this.  Advised her that continuous flow oxygen may be preferable for her - however then call was disconnected and I was not able to get her back on the line despite calling back several times   BP Readings from Last 3 Encounters:  02/18/17 132/82  01/29/17 132/70  01/22/17 (!) 103/49

## 2017-02-19 NOTE — Telephone Encounter (Addendum)
°  Relation to pt: self  Call back number: [phone]   Reason for call:  Patient states BP was 116/80 this morning and patient d/c BP medication for 3 days in need of clinical advice unsure if she should resume BP medication, patient is concern her BP is dropping, last seen 02/18/17, please advise.  Patient would like to be referred to [facility] orthopedic due to not having transportation to [facility] [employer], please advise

## 2017-02-19 NOTE — Telephone Encounter (Signed)
Called and gave her this verbal order

## 2017-02-23 DIAGNOSIS — J449 Chronic obstructive pulmonary disease, unspecified: Secondary | ICD-10-CM | POA: Diagnosis not present

## 2017-02-24 DIAGNOSIS — R1312 Dysphagia, oropharyngeal phase: Secondary | ICD-10-CM | POA: Diagnosis not present

## 2017-02-24 DIAGNOSIS — I2609 Other pulmonary embolism with acute cor pulmonale: Secondary | ICD-10-CM | POA: Diagnosis not present

## 2017-02-24 DIAGNOSIS — R2681 Unsteadiness on feet: Secondary | ICD-10-CM | POA: Diagnosis not present

## 2017-02-24 DIAGNOSIS — I2510 Atherosclerotic heart disease of native coronary artery without angina pectoris: Secondary | ICD-10-CM | POA: Diagnosis not present

## 2017-02-24 DIAGNOSIS — R488 Other symbolic dysfunctions: Secondary | ICD-10-CM | POA: Diagnosis not present

## 2017-02-24 DIAGNOSIS — J9611 Chronic respiratory failure with hypoxia: Secondary | ICD-10-CM | POA: Diagnosis not present

## 2017-02-24 DIAGNOSIS — J9612 Chronic respiratory failure with hypercapnia: Secondary | ICD-10-CM | POA: Diagnosis not present

## 2017-02-24 DIAGNOSIS — D329 Benign neoplasm of meninges, unspecified: Secondary | ICD-10-CM | POA: Diagnosis not present

## 2017-02-24 DIAGNOSIS — J449 Chronic obstructive pulmonary disease, unspecified: Secondary | ICD-10-CM | POA: Diagnosis not present

## 2017-02-24 DIAGNOSIS — M6281 Muscle weakness (generalized): Secondary | ICD-10-CM | POA: Diagnosis not present

## 2017-02-24 DIAGNOSIS — I10 Essential (primary) hypertension: Secondary | ICD-10-CM | POA: Diagnosis not present

## 2017-02-24 DIAGNOSIS — Z9981 Dependence on supplemental oxygen: Secondary | ICD-10-CM | POA: Diagnosis not present

## 2017-02-24 DIAGNOSIS — D689 Coagulation defect, unspecified: Secondary | ICD-10-CM | POA: Diagnosis not present

## 2017-02-25 DIAGNOSIS — M19012 Primary osteoarthritis, left shoulder: Secondary | ICD-10-CM | POA: Diagnosis not present

## 2017-02-25 DIAGNOSIS — M7542 Impingement syndrome of left shoulder: Secondary | ICD-10-CM | POA: Diagnosis not present

## 2017-02-25 DIAGNOSIS — M25522 Pain in left elbow: Secondary | ICD-10-CM | POA: Diagnosis not present

## 2017-02-26 ENCOUNTER — Encounter: Payer: Self-pay | Admitting: Pulmonary Disease

## 2017-02-26 ENCOUNTER — Telehealth: Payer: Self-pay | Admitting: *Deleted

## 2017-02-26 ENCOUNTER — Ambulatory Visit (INDEPENDENT_AMBULATORY_CARE_PROVIDER_SITE_OTHER): Payer: Medicare Other | Admitting: Pulmonary Disease

## 2017-02-26 DIAGNOSIS — J449 Chronic obstructive pulmonary disease, unspecified: Secondary | ICD-10-CM | POA: Diagnosis not present

## 2017-02-26 DIAGNOSIS — J9611 Chronic respiratory failure with hypoxia: Secondary | ICD-10-CM

## 2017-02-26 DIAGNOSIS — R488 Other symbolic dysfunctions: Secondary | ICD-10-CM | POA: Diagnosis not present

## 2017-02-26 DIAGNOSIS — I10 Essential (primary) hypertension: Secondary | ICD-10-CM | POA: Diagnosis not present

## 2017-02-26 DIAGNOSIS — I2782 Chronic pulmonary embolism: Secondary | ICD-10-CM

## 2017-02-26 DIAGNOSIS — J9612 Chronic respiratory failure with hypercapnia: Secondary | ICD-10-CM | POA: Diagnosis not present

## 2017-02-26 DIAGNOSIS — I2609 Other pulmonary embolism with acute cor pulmonale: Secondary | ICD-10-CM | POA: Diagnosis not present

## 2017-02-26 DIAGNOSIS — M6281 Muscle weakness (generalized): Secondary | ICD-10-CM | POA: Diagnosis not present

## 2017-02-26 DIAGNOSIS — J439 Emphysema, unspecified: Secondary | ICD-10-CM

## 2017-02-26 DIAGNOSIS — Z9981 Dependence on supplemental oxygen: Secondary | ICD-10-CM | POA: Diagnosis not present

## 2017-02-26 DIAGNOSIS — D329 Benign neoplasm of meninges, unspecified: Secondary | ICD-10-CM | POA: Diagnosis not present

## 2017-02-26 DIAGNOSIS — R1312 Dysphagia, oropharyngeal phase: Secondary | ICD-10-CM | POA: Diagnosis not present

## 2017-02-26 DIAGNOSIS — D689 Coagulation defect, unspecified: Secondary | ICD-10-CM | POA: Diagnosis not present

## 2017-02-26 DIAGNOSIS — I2510 Atherosclerotic heart disease of native coronary artery without angina pectoris: Secondary | ICD-10-CM | POA: Diagnosis not present

## 2017-02-26 DIAGNOSIS — R2681 Unsteadiness on feet: Secondary | ICD-10-CM | POA: Diagnosis not present

## 2017-02-26 MED ORDER — FLUTICASONE FUROATE-VILANTEROL 200-25 MCG/INH IN AEPB: 1.0000 | INHALATION_SPRAY | Freq: Every day | RESPIRATORY_TRACT | 0 refills | Status: DC

## 2017-02-26 NOTE — Progress Notes (Signed)
   Subjective:    Patient ID: Holly Page, female    DOB: 1936-05-29, 81 y.o.   MRN: [id number]  HPI  81 y.o. F with Gold D Copd primary emphysema on O2   Hx of DVT -05/2014 (tx w/ xarelto until 12/2014 ) -followed by hematology  Left arm superficial venous thrombus , Xarelto 01/25/15 >stopped 07/2015  She has chronic diarrhea and protein calorie malnutrition  Atrovent and long acting Anticholinergics are listed as allergies    Chief Complaint  Patient presents with  . Acute Visit    Increased SOB for the past week.     She is accompanied by her son today, in a wheelchair on oxygen. She complains about increased frequency of urination, was given basic airway urology but this is not working. She is current on her fluid intake in the evenings. She is unable to gain weight. She is frustrated about frequent urination and feels that repeated trips to her bathroom is causing her pneumonia  She is compliant with oxygen. I note that [place] [facility] has fallen off her medication list. She uses albuterol nebs 4 times daily.   She was hospitalized 11/2016 for influenza and submassive bilateral pulmonary embolism, on superficial left arm blood clot and then again in 01/2017  Significant tests/ events  CT chest, on March 19 2015 >that showed no evidence of pulmonary embolism and stable. Severe COPD changes. 2015 spirometry showed FEV1 at 30%  She has been tried on Spiriva , Incruse , Anoro and [place] but was unable to tolerate.   Review of Systems     Objective:   Physical Exam        Assessment & Plan:

## 2017-02-26 NOTE — Patient Instructions (Signed)
Start back on Breo once daily - rinse mouth after use.  Check with urology about your urinary problems

## 2017-02-26 NOTE — Assessment & Plan Note (Signed)
Start back on Breo once daily - rinse mouth after use. Hopefully this will decrease her use of daily Unfortunately she has allergy to anticholinergic

## 2017-02-26 NOTE — Telephone Encounter (Signed)
Received Physician Orders /Case Communication from [facility], forwarded to provider/SLS 05/31

## 2017-02-26 NOTE — Assessment & Plan Note (Signed)
Will need lifelong anticoagulation

## 2017-02-26 NOTE — Assessment & Plan Note (Signed)
Continue 2L oxygen. 

## 2017-02-28 ENCOUNTER — Other Ambulatory Visit: Payer: Self-pay | Admitting: Adult Health

## 2017-03-02 ENCOUNTER — Ambulatory Visit (INDEPENDENT_AMBULATORY_CARE_PROVIDER_SITE_OTHER): Payer: Medicare Other | Admitting: Family Medicine

## 2017-03-02 VITALS — BP 132/82 | HR 83 | Temp 98.2°F | Ht 65.5 in

## 2017-03-02 DIAGNOSIS — Z9981 Dependence on supplemental oxygen: Secondary | ICD-10-CM

## 2017-03-02 DIAGNOSIS — E876 Hypokalemia: Secondary | ICD-10-CM | POA: Diagnosis not present

## 2017-03-02 DIAGNOSIS — Z5181 Encounter for therapeutic drug level monitoring: Secondary | ICD-10-CM

## 2017-03-02 DIAGNOSIS — I10 Essential (primary) hypertension: Secondary | ICD-10-CM | POA: Diagnosis not present

## 2017-03-02 DIAGNOSIS — J449 Chronic obstructive pulmonary disease, unspecified: Secondary | ICD-10-CM

## 2017-03-02 NOTE — Patient Instructions (Addendum)
It was good to see you today- please come in for a lab visit in 3-4 weeks to check your potasium level.  We can stop the verapamil at this point- I agree that you do not need it. If your blood pressure starts running higher than 145/90 please let me know  Take care and let me know if you need anything!

## 2017-03-02 NOTE — Progress Notes (Signed)
[place] at [facility] [address], [place], [place] [address] [phone]  Date:  03/02/2017   Name:  Holly Page   DOB:  [birth year]   MRN:  [id number]  PCP:  Holly Mclean, MD    Chief Complaint: Follow-up (Pt here for 6-8 week follow up. )   History of Present Illness:  Holly Page is a 81 y.o. very pleasant female patient who presents with the following:  Here today for a recheck visit-  Last visit with me about 2 weeks ago with the following partial HPI:  She was admitted to [facility] from 5/13 to 5/15 with COPD exacerbation and pneumonia  She presented with SOB and chest pain.  She was treated with IV steroids, abx and breathing treatments She is done with her abx and oral steroid at this time She has not noted any fever- she will still feel hot and cold however     Wt Readings from Last 3 Encounters:  02/18/17 90 lb 6.4 oz (41 kg)  01/29/17 90 lb 12.8 oz (41.2 kg)  01/22/17 92 lb 12.8 oz (42.1 kg)   She does get meals on wheels once a day.  Otherwise she notes that she has a hard time getting meals that appeal to her Blood in her urine has resolved and she is back on her eliquis  She did see Dr. Junious Silk yesterday-urology.  For the time being her microhematuria is clear and she is ok to be back on blood thinners  She also saw Dr. Elsworth Soho- pulmonology- a week ago They put her back on the Breo inhaler- she had just forgotten to use it while she was in the hospital She is now back on the "puffer" O2 instead of her continuous tank- this is much smaller and easier to her to use although perhaps not as good for her breathing.  She notes that she often will feel short of breath and still has some wheezing.  She understands that these sx are not unexpected given her end stage COPD  Wt Readings from Last 3 Encounters:  02/26/17 89 lb (40.4 kg)  02/18/17 90 lb 6.4 oz (41 kg)  01/29/17 90 lb 12.8 oz (41.2 kg)   BP  Readings from Last 3 Encounters:  03/02/17 132/82  02/26/17 (!) 148/79  02/18/17 132/82   She has been off her verapamil for at least a couple of weeks. However, her BP looks fine.  Would like to come off this med for good if ok She is not taking hydralazine per her report.  However she does not have her medications with her so we are not absolutely certain.  She will bring all her meds to her next appt  She had been taking klor-con 10 meq; she ran out a few days ago, but we are not sure if she needs to continue taking this either   Chemistry      Component Value Date/Time   NA 146 (H) 01/22/2017 1301   NA 148 (H) 12/30/2016 0803   K 3.8 01/22/2017 1301   K 4.1 12/30/2016 0803   CL 94 (L) 01/22/2017 1301   CO2 36 (H) 01/22/2017 1301   CO2 41 (H) 12/30/2016 0803   BUN 8 01/22/2017 1301   BUN 8.2 12/30/2016 0803   CREATININE 0.7 01/22/2017 1301   CREATININE 0.6 12/30/2016 0803   GLU 105 12/25/2016      Component Value Date/Time  CALCIUM 9.9 01/22/2017 1301   CALCIUM 10.0 12/30/2016 0803   ALKPHOS 56 01/22/2017 1301   ALKPHOS 67 12/30/2016 0803   AST 50 (H) 01/22/2017 1301   AST 44 (H) 12/30/2016 0803   ALT 23 01/22/2017 1301   ALT 24 12/30/2016 0803   BILITOT 0.60 01/22/2017 1301   BILITOT 0.39 12/30/2016 0803       Today her main concern seems to be frequent urination which she feels is causing her to have pneumonia.   She has seen urology and they tried vesicare- however this medication was expensive and did not seem to help her.  She has not followed-up with her urologist but plans to do so  Here today with her son Former smoker  Patient Active Problem List   Diagnosis Date Noted  . Iron deficiency anemia due to chronic blood loss 01/23/2017  . 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 Pulmonary embolus ([place]) 12/30/2016  . Severe sepsis ([place]) 12/26/2016  . Influenza B 12/26/2016  . Acute urinary retention 12/26/2016  . Oropharyngeal dysphagia 12/26/2016  . Metabolic  acidosis 82/50/0370  . Polyneuropathy 12/26/2016  . Arthritis   . Clotting disorder ([place])   . CAD (coronary artery disease)   . Fibroid   . Hypertension   . Kidney stone   . Protein-calorie malnutrition, severe 07/23/2016  . Shortness of breath 07/23/2016  . SOB (shortness of breath) 07/21/2016  . Acute on chronic respiratory failure with hypoxia and hypercapnia ([place]) 07/21/2016  . Chronic respiratory failure ([place]) 04/10/2016  . COPD with exacerbation ([place]) 03/19/2016  . RBC microcytosis 03/19/2016  . COPD with acute exacerbation ([place]) 03/19/2016  . Oxygen dependent 12/31/2015  . Anxiety 11/08/2015  . Hyperthyroidism 09/07/2015  . Malnutrition of moderate degree 08/31/2015  . Abnormal LFTs 03/19/2015  . Back pain 03/19/2015  . HTN (hypertension) 03/01/2015  . Anemia 12/19/2014  . Calculus of kidney 08/30/2014  . Hematuria 08/11/2014  . History of DVT (deep vein thrombosis)   . Neuralgia neuritis, sciatic nerve 05/22/2014  . 1st degree AV block 04/24/2014  . History of colon polyps 04/24/2014  . Adaptive colitis 04/24/2014  . APC (atrial premature contractions) 04/24/2014  . Benign neoplasm of meninges ([place]) 02/10/2014  . Barrett esophagus 02/03/2014  . Spinal stenosis 12/15/2013  . Allergic rhinitis 11/21/2013  . COPD with emphysema Gold D   . GERD (gastroesophageal reflux disease)   . Lactose intolerance   . DDD (degenerative disc disease), lumbosacral 06/11/2010  . Diffuse cerebrovascular disease 06/11/2010    Past Medical History:  Diagnosis Date  . Anemia   . Arthritis   . CAD (coronary artery disease)   . Clotting disorder ([place])   . COPD (chronic obstructive pulmonary disease) ([place])   . DVT (deep venous thrombosis) ([place]) 1980s, recurrent 2015   Right DVT 2015  on xarelto  . Fibroid   . GERD (gastroesophageal reflux disease)   . Hypertension   . Hypertension   . Iron deficiency anemia due to chronic blood loss 01/23/2017  . 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  Kidney stone   . Lactose intolerance   . Meningioma ([place])    followed by Dr Christella Noa  . Pneumonia   . Spinal stenosis     Past Surgical History:  Procedure Laterality Date  . CHOLECYSTECTOMY    . FOOT SURGERY    . INCISION AND DRAINAGE FOOT    . TONSILLECTOMY AND ADENOIDECTOMY    . TREATMENT FISTULA ANAL    . TUBAL LIGATION  Social History  Substance Use Topics  . Smoking status: Former Smoker    Packs/day: 1.50    Years: 56.00    Types: Cigarettes    Quit date: 04/29/2012  . Smokeless tobacco: Never Used     Comment: quit smoking 3 years ago  . Alcohol use No     Comment: quit drinking beer 2005    Family History  Problem Relation Age of Onset  . COPD Father        smoker deceased.   . Emphysema Father   . Lupus Sister   . Heart attack Son   . Heart disease Son   . Hypertension Son   . Hypertension Son     Allergies  Allergen Reactions  . Aspirin Other (See Comments)    Other reaction(s): PALPITATIONS Other reaction(s): DIFFICULTY BREATHING Heart flutter  . Incruse Ellipta [Umeclidinium Bromide] Shortness Of Breath  . Tramadol Shortness Of Breath  . Amlodipine Other (See Comments)    Other reaction(s): SWELLING  . Codeine Other (See Comments)    Hallucinations, can take Tylenol #3 now  . Doxycycline Other (See Comments)    Other reaction(s): NAUSEA,VOMITING  . Gabapentin Other (See Comments)    Other reaction(s): Other (See Comments) She could not swallow the large pill  . Hydrocodone Other (See Comments)    hallucinations  . Losartan Other (See Comments)    unknown  . Propoxyphene Nausea Only  . Tiotropium Itching and Rash    Medication list has been reviewed and updated.  Current Outpatient Prescriptions on File Prior to Visit  Medication Sig Dispense Refill  . acetaminophen-codeine (TYLENOL #3) 300-30 MG tablet Take 1 tablet by mouth every 6 (six) hours as needed for moderate pain. 30 tablet 1  . Acidophilus Lactobacillus CAPS Take 1  capsule by mouth daily with breakfast.    . albuterol (PROVENTIL HFA;VENTOLIN HFA) 108 (90 Base) MCG/ACT inhaler Inhale 2 puffs into the lungs every 4 (four) hours as needed for wheezing or shortness of breath. 2 Inhaler 6  . albuterol (PROVENTIL) (2.5 MG/3ML) 0.083% nebulizer solution Take 3 mLs (2.5 mg total) by nebulization every 6 (six) hours as needed for wheezing or shortness of breath. 375 mL 0  . apixaban (ELIQUIS) 5 MG TABS tablet Take 1 tablet (5 mg total) by mouth 2 (two) times daily. 60 tablet 11  . cholecalciferol (VITAMIN D) 1000 units tablet Take 1,000 Units by mouth daily.    . fluticasone furoate-vilanterol (BREO ELLIPTA) 200-25 MCG/INH AEPB Inhale 1 puff into the lungs daily. 1 each 0  . hydrALAZINE (APRESOLINE) 10 MG tablet Take 10 mg by mouth every 8 (eight) hours.    . Lactobacillus Acidophilus POWD Take by mouth.    . Multiple Vitamins-Minerals (MULTIVITAMIN & MINERAL PO) Take 1 tablet by mouth daily.    . Nutritional Supplements (ENSURE ENLIVE PO) Take 237 mLs by mouth 2 (two) times daily.    . OXYGEN Inhale 2 L into the lungs daily as needed. To maintain o2 saturation of 90%    . potassium chloride (K-DUR,KLOR-CON) 10 MEQ tablet Take 10 mEq by mouth daily. Patient takes 10 mEq once a day.    . ranitidine (ZANTAC) 300 MG capsule Take 1 capsule (300 mg total) by mouth every evening. 30 capsule 11   No current facility-administered medications on file prior to visit.     Review of Systems:  As per HPI- otherwise negative.   Physical Examination: Vitals:   03/02/17 0956  BP: 132/82  Pulse: 83  Temp: 98.2 F (36.8 C)   Vitals:   03/02/17 0956  Height: 5' 5.5" (1.664 m)   There is no height or weight on file to calculate BMI. Ideal Body Weight: Weight in (lb) to have BMI = 25: 152.2  GEN: WDWN, NAD, Non-toxic, A & O x 3, very thin, sitting in WC today.  Her son with with her.  Appears her normal self  HEENT: Atraumatic, Normocephalic. Neck supple. No masses, No  LAD. Ears and Nose: No external deformity. CV: RRR, No M/G/R. No JVD. No thrill. No extra heart sounds. PULM: CTA B, minimal wheezing bilaterally, nocrackles, rhonchi. No retractions. No resp. distress. No accessory muscle use. EXTR: No c/c/e NEURO normal movement of limbs,  Using WC due to weakness and fatigue with walking  PSYCH: Normally interactive. Conversant. Not depressed or anxious appearing.  Calm demeanor.    Assessment and Plan: Medication monitoring encounter - Plan: Basic metabolic panel  COPD, severe ([place])  Oxygen dependent  Hypokalemia  Essential hypertension  Will have her come in for a K level in 3-4 weeks as she is out of her K supplement.  We are not sure if she needs to continue to take this.  She is not on any diuretic  Ok to stop her BP medication at this time- she will monitor her BP at home and will let me know if high. As she becomes thinner and weaker we expect her to need less BP control  Will plan further follow- up pending labs.   Signed Lamar Blinks, MD

## 2017-03-03 DIAGNOSIS — M6281 Muscle weakness (generalized): Secondary | ICD-10-CM | POA: Diagnosis not present

## 2017-03-03 DIAGNOSIS — D329 Benign neoplasm of meninges, unspecified: Secondary | ICD-10-CM | POA: Diagnosis not present

## 2017-03-03 DIAGNOSIS — R1312 Dysphagia, oropharyngeal phase: Secondary | ICD-10-CM | POA: Diagnosis not present

## 2017-03-03 DIAGNOSIS — I2609 Other pulmonary embolism with acute cor pulmonale: Secondary | ICD-10-CM | POA: Diagnosis not present

## 2017-03-03 DIAGNOSIS — D689 Coagulation defect, unspecified: Secondary | ICD-10-CM | POA: Diagnosis not present

## 2017-03-03 DIAGNOSIS — J9611 Chronic respiratory failure with hypoxia: Secondary | ICD-10-CM | POA: Diagnosis not present

## 2017-03-03 DIAGNOSIS — R488 Other symbolic dysfunctions: Secondary | ICD-10-CM | POA: Diagnosis not present

## 2017-03-03 DIAGNOSIS — Z9981 Dependence on supplemental oxygen: Secondary | ICD-10-CM | POA: Diagnosis not present

## 2017-03-03 DIAGNOSIS — I10 Essential (primary) hypertension: Secondary | ICD-10-CM | POA: Diagnosis not present

## 2017-03-03 DIAGNOSIS — J449 Chronic obstructive pulmonary disease, unspecified: Secondary | ICD-10-CM | POA: Diagnosis not present

## 2017-03-03 DIAGNOSIS — R2681 Unsteadiness on feet: Secondary | ICD-10-CM | POA: Diagnosis not present

## 2017-03-03 DIAGNOSIS — J9612 Chronic respiratory failure with hypercapnia: Secondary | ICD-10-CM | POA: Diagnosis not present

## 2017-03-03 DIAGNOSIS — I2510 Atherosclerotic heart disease of native coronary artery without angina pectoris: Secondary | ICD-10-CM | POA: Diagnosis not present

## 2017-03-04 DIAGNOSIS — I2510 Atherosclerotic heart disease of native coronary artery without angina pectoris: Secondary | ICD-10-CM | POA: Diagnosis not present

## 2017-03-04 DIAGNOSIS — R2681 Unsteadiness on feet: Secondary | ICD-10-CM | POA: Diagnosis not present

## 2017-03-04 DIAGNOSIS — I10 Essential (primary) hypertension: Secondary | ICD-10-CM | POA: Diagnosis not present

## 2017-03-04 DIAGNOSIS — M6281 Muscle weakness (generalized): Secondary | ICD-10-CM | POA: Diagnosis not present

## 2017-03-04 DIAGNOSIS — Z9981 Dependence on supplemental oxygen: Secondary | ICD-10-CM | POA: Diagnosis not present

## 2017-03-04 DIAGNOSIS — R488 Other symbolic dysfunctions: Secondary | ICD-10-CM | POA: Diagnosis not present

## 2017-03-04 DIAGNOSIS — J449 Chronic obstructive pulmonary disease, unspecified: Secondary | ICD-10-CM | POA: Diagnosis not present

## 2017-03-04 DIAGNOSIS — J9611 Chronic respiratory failure with hypoxia: Secondary | ICD-10-CM | POA: Diagnosis not present

## 2017-03-04 DIAGNOSIS — R1312 Dysphagia, oropharyngeal phase: Secondary | ICD-10-CM | POA: Diagnosis not present

## 2017-03-04 DIAGNOSIS — D689 Coagulation defect, unspecified: Secondary | ICD-10-CM | POA: Diagnosis not present

## 2017-03-04 DIAGNOSIS — D329 Benign neoplasm of meninges, unspecified: Secondary | ICD-10-CM | POA: Diagnosis not present

## 2017-03-04 DIAGNOSIS — I2609 Other pulmonary embolism with acute cor pulmonale: Secondary | ICD-10-CM | POA: Diagnosis not present

## 2017-03-04 DIAGNOSIS — J9612 Chronic respiratory failure with hypercapnia: Secondary | ICD-10-CM | POA: Diagnosis not present

## 2017-03-05 DIAGNOSIS — R488 Other symbolic dysfunctions: Secondary | ICD-10-CM | POA: Diagnosis not present

## 2017-03-05 DIAGNOSIS — I2609 Other pulmonary embolism with acute cor pulmonale: Secondary | ICD-10-CM | POA: Diagnosis not present

## 2017-03-05 DIAGNOSIS — R2681 Unsteadiness on feet: Secondary | ICD-10-CM | POA: Diagnosis not present

## 2017-03-05 DIAGNOSIS — M6281 Muscle weakness (generalized): Secondary | ICD-10-CM | POA: Diagnosis not present

## 2017-03-05 DIAGNOSIS — D329 Benign neoplasm of meninges, unspecified: Secondary | ICD-10-CM | POA: Diagnosis not present

## 2017-03-05 DIAGNOSIS — R1312 Dysphagia, oropharyngeal phase: Secondary | ICD-10-CM | POA: Diagnosis not present

## 2017-03-05 DIAGNOSIS — J9611 Chronic respiratory failure with hypoxia: Secondary | ICD-10-CM | POA: Diagnosis not present

## 2017-03-05 DIAGNOSIS — I10 Essential (primary) hypertension: Secondary | ICD-10-CM | POA: Diagnosis not present

## 2017-03-05 DIAGNOSIS — J449 Chronic obstructive pulmonary disease, unspecified: Secondary | ICD-10-CM | POA: Diagnosis not present

## 2017-03-05 DIAGNOSIS — D689 Coagulation defect, unspecified: Secondary | ICD-10-CM | POA: Diagnosis not present

## 2017-03-05 DIAGNOSIS — Z9981 Dependence on supplemental oxygen: Secondary | ICD-10-CM | POA: Diagnosis not present

## 2017-03-05 DIAGNOSIS — I2510 Atherosclerotic heart disease of native coronary artery without angina pectoris: Secondary | ICD-10-CM | POA: Diagnosis not present

## 2017-03-05 DIAGNOSIS — J9612 Chronic respiratory failure with hypercapnia: Secondary | ICD-10-CM | POA: Diagnosis not present

## 2017-03-06 ENCOUNTER — Ambulatory Visit (HOSPITAL_BASED_OUTPATIENT_CLINIC_OR_DEPARTMENT_OTHER): Payer: Medicare Other | Admitting: Family

## 2017-03-06 ENCOUNTER — Other Ambulatory Visit (HOSPITAL_BASED_OUTPATIENT_CLINIC_OR_DEPARTMENT_OTHER): Payer: Medicare Other

## 2017-03-06 VITALS — BP 141/70 | HR 93 | Temp 98.4°F | Resp 20 | Wt 89.0 lb

## 2017-03-06 DIAGNOSIS — I2510 Atherosclerotic heart disease of native coronary artery without angina pectoris: Secondary | ICD-10-CM | POA: Diagnosis not present

## 2017-03-06 DIAGNOSIS — R2681 Unsteadiness on feet: Secondary | ICD-10-CM | POA: Diagnosis not present

## 2017-03-06 DIAGNOSIS — Z9981 Dependence on supplemental oxygen: Secondary | ICD-10-CM | POA: Diagnosis not present

## 2017-03-06 DIAGNOSIS — I2609 Other pulmonary embolism with acute cor pulmonale: Secondary | ICD-10-CM | POA: Diagnosis not present

## 2017-03-06 DIAGNOSIS — I10 Essential (primary) hypertension: Secondary | ICD-10-CM | POA: Diagnosis not present

## 2017-03-06 DIAGNOSIS — Z7901 Long term (current) use of anticoagulants: Secondary | ICD-10-CM

## 2017-03-06 DIAGNOSIS — I2782 Chronic pulmonary embolism: Secondary | ICD-10-CM | POA: Diagnosis not present

## 2017-03-06 DIAGNOSIS — J9612 Chronic respiratory failure with hypercapnia: Secondary | ICD-10-CM | POA: Diagnosis not present

## 2017-03-06 DIAGNOSIS — J449 Chronic obstructive pulmonary disease, unspecified: Secondary | ICD-10-CM | POA: Diagnosis not present

## 2017-03-06 DIAGNOSIS — Z86718 Personal history of other venous thrombosis and embolism: Secondary | ICD-10-CM

## 2017-03-06 DIAGNOSIS — R319 Hematuria, unspecified: Secondary | ICD-10-CM

## 2017-03-06 DIAGNOSIS — D508 Other iron deficiency anemias: Secondary | ICD-10-CM | POA: Diagnosis not present

## 2017-03-06 DIAGNOSIS — D689 Coagulation defect, unspecified: Secondary | ICD-10-CM | POA: Diagnosis not present

## 2017-03-06 DIAGNOSIS — R1312 Dysphagia, oropharyngeal phase: Secondary | ICD-10-CM | POA: Diagnosis not present

## 2017-03-06 DIAGNOSIS — D329 Benign neoplasm of meninges, unspecified: Secondary | ICD-10-CM | POA: Diagnosis not present

## 2017-03-06 DIAGNOSIS — D500 Iron deficiency anemia secondary to blood loss (chronic): Secondary | ICD-10-CM

## 2017-03-06 DIAGNOSIS — R488 Other symbolic dysfunctions: Secondary | ICD-10-CM | POA: Diagnosis not present

## 2017-03-06 DIAGNOSIS — J9611 Chronic respiratory failure with hypoxia: Secondary | ICD-10-CM | POA: Diagnosis not present

## 2017-03-06 DIAGNOSIS — M6281 Muscle weakness (generalized): Secondary | ICD-10-CM | POA: Diagnosis not present

## 2017-03-06 LAB — CBC WITH DIFFERENTIAL (CANCER CENTER ONLY)
BASO#: 0 10*3/uL (ref 0.0–0.2)
BASO%: 0.5 % (ref 0.0–2.0)
EOS ABS: 0.2 10*3/uL (ref 0.0–0.5)
EOS%: 3.6 % (ref 0.0–7.0)
HCT: 35.9 % (ref 34.8–46.6)
HGB: 10.6 g/dL — ABNORMAL LOW (ref 11.6–15.9)
LYMPH#: 1.9 10*3/uL (ref 0.9–3.3)
LYMPH%: 34.9 % (ref 14.0–48.0)
MCH: 22.8 pg — AB (ref 26.0–34.0)
MCHC: 29.5 g/dL — AB (ref 32.0–36.0)
MCV: 77 fL — ABNORMAL LOW (ref 81–101)
MONO#: 0.6 10*3/uL (ref 0.1–0.9)
MONO%: 10.3 % (ref 0.0–13.0)
NEUT#: 2.8 10*3/uL (ref 1.5–6.5)
NEUT%: 50.7 % (ref 39.6–80.0)
PLATELETS: 290 10*3/uL (ref 145–400)
RBC: 4.65 10*6/uL (ref 3.70–5.32)
RDW: 15.5 % (ref 11.1–15.7)
WBC: 5.6 10*3/uL (ref 3.9–10.0)

## 2017-03-06 LAB — CMP (CANCER CENTER ONLY)
ALT(SGPT): 24 U/L (ref 10–47)
AST: 49 U/L — ABNORMAL HIGH (ref 11–38)
Albumin: 3.6 g/dL (ref 3.3–5.5)
Alkaline Phosphatase: 75 U/L (ref 26–84)
BUN: 10 mg/dL (ref 7–22)
CO2: 36 meq/L — AB (ref 18–33)
CREATININE: 0.5 mg/dL — AB (ref 0.6–1.2)
Calcium: 9.7 mg/dL (ref 8.0–10.3)
Chloride: 102 mEq/L (ref 98–108)
Glucose, Bld: 92 mg/dL (ref 73–118)
Potassium: 3.7 mEq/L (ref 3.3–4.7)
SODIUM: 145 meq/L (ref 128–145)
Total Bilirubin: 0.7 mg/dl (ref 0.20–1.60)
Total Protein: 7 g/dL (ref 6.4–8.1)

## 2017-03-06 NOTE — Progress Notes (Signed)
Hematology and Oncology Follow Up Visit  Holly Page [id number] 07/19/1936 81 y.o. 03/06/2017   Principle Diagnosis:  Thromboembolic disease of the right thigh Superficial thrombus of the left arm  Pulmonary Embolism - dx in 11/2016  Current Therapy:   Eliquis 5 mg po BID   Interim History:  Holly Page is here today for follow-up. She is doing a little better today. Her SOB is not as bad. She states that yesterday was a bad day for her. She has shoulder pain that has not improved with steroid injection. She has had issues with kidney stones and urinary frequency at night. Her PCP had her start Oxytrol to help with the urinary frequency.  She is on supplemental O2 2L [place] 24 hours a day. She is doing well on Eliquis and verbalized that she is taking 5 mg PO BID as prescribed.   She has had no episodes of bleeding, bruising or petechiae.  No lymphadenopathy found on exam.  No fever, n/v, cough, rash, dizziness, chest pain, palpitations, abdominal pain or changes in bowel habits.  No swelling, numbness or tingling in her extremities.  She is still doing PT 2-3 times a week at home. She enjoys this but she does have some fatigue after.   ECOG Performance Status: 1 - Symptomatic but completely ambulatory  Medications:  Allergies as of 03/06/2017      Reactions   Aspirin Other (See Comments)   Other reaction(s): PALPITATIONS Other reaction(s): DIFFICULTY BREATHING Heart flutter   Incruse Ellipta [umeclidinium Bromide] Shortness Of Breath   Tramadol Shortness Of Breath   Amlodipine Other (See Comments)   Other reaction(s): SWELLING   Codeine Other (See Comments)   Hallucinations, can take Tylenol #3 now   Doxycycline Other (See Comments)   Other reaction(s): NAUSEA,VOMITING   Gabapentin Other (See Comments)   Other reaction(s): Other (See Comments) She could not swallow the large pill   Hydrocodone Other (See Comments)   hallucinations   Losartan Other (See Comments)   unknown   Propoxyphene Nausea Only   Tiotropium Itching, Rash      Medication List       Accurate as of 03/06/17  1:31 PM. Always use your most recent med list.          acetaminophen-codeine 300-30 MG tablet Commonly known as:  TYLENOL #3 Take 1 tablet by mouth every 6 (six) hours as needed for moderate pain.   Acidophilus Lactobacillus Caps Take 1 capsule by mouth daily with breakfast.   albuterol 108 (90 Base) MCG/ACT inhaler Commonly known as:  PROVENTIL HFA;VENTOLIN HFA Inhale 2 puffs into the lungs every 4 (four) hours as needed for wheezing or shortness of breath.   albuterol (2.5 MG/3ML) 0.083% nebulizer solution Commonly known as:  PROVENTIL Take 3 mLs (2.5 mg total) by nebulization every 6 (six) hours as needed for wheezing or shortness of breath.   apixaban 5 MG Tabs tablet Commonly known as:  ELIQUIS Take 1 tablet (5 mg total) by mouth 2 (two) times daily.   cholecalciferol 1000 units tablet Commonly known as:  VITAMIN D Take 1,000 Units by mouth daily.   ENSURE ENLIVE PO Take 237 mLs by mouth 2 (two) times daily.   fluticasone furoate-vilanterol 200-25 MCG/INH Aepb Commonly known as:  BREO ELLIPTA Inhale 1 puff into the lungs daily.   hydrALAZINE 10 MG tablet Commonly known as:  APRESOLINE Take 10 mg by mouth every 8 (eight) hours.   Lactobacillus Acidophilus Powd Take by mouth.  MULTIVITAMIN & MINERAL PO Take 1 tablet by mouth daily.   OXYGEN Inhale 2 L into the lungs daily as needed. To maintain o2 saturation of 90%   ranitidine 300 MG capsule Commonly known as:  ZANTAC Take 1 capsule (300 mg total) by mouth every evening.       Allergies:  Allergies  Allergen Reactions  . Aspirin Other (See Comments)    Other reaction(s): PALPITATIONS Other reaction(s): DIFFICULTY BREATHING Heart flutter  . Incruse Ellipta [Umeclidinium Bromide] Shortness Of Breath  . Tramadol Shortness Of Breath  . Amlodipine Other (See Comments)    Other  reaction(s): SWELLING  . Codeine Other (See Comments)    Hallucinations, can take Tylenol #3 now  . Doxycycline Other (See Comments)    Other reaction(s): NAUSEA,VOMITING  . Gabapentin Other (See Comments)    Other reaction(s): Other (See Comments) She could not swallow the large pill  . Hydrocodone Other (See Comments)    hallucinations  . Losartan Other (See Comments)    unknown  . Propoxyphene Nausea Only  . Tiotropium Itching and Rash    Past Medical History, Surgical history, Social history, and Family History were reviewed and updated.  Review of Systems: All other 10 point review of systems is negative.   Physical Exam:  vitals were not taken for this visit.  Wt Readings from Last 3 Encounters:  02/26/17 89 lb (40.4 kg)  02/18/17 90 lb 6.4 oz (41 kg)  01/29/17 90 lb 12.8 oz (41.2 kg)    Ocular: Sclerae unicteric, pupils equal, round and reactive to light Ear-nose-throat: Oropharynx clear, dentition fair Lymphatic: No cervical, supraclavicular or axillary adenopathy Lungs no rales or rhonchi, good excursion bilaterally Heart regular rate and rhythm, no murmur appreciated Abd soft, nontender, positive bowel sounds, no liver or spleen tip palpated on exam, no fluid wave MSK no focal spinal tenderness, no joint edema Neuro: non-focal, well-oriented, appropriate affect Breasts: Deferred   Lab Results  Component Value Date   WBC 5.6 03/06/2017   HGB 10.6 (L) 03/06/2017   HCT 35.9 03/06/2017   MCV 77 (L) 03/06/2017   PLT 290 03/06/2017   Lab Results  Component Value Date   FERRITIN 253 01/22/2017   IRON 35 (L) 01/22/2017   TIBC 299 01/22/2017   UIBC 263 01/22/2017   IRONPCTSAT 12 (L) 01/22/2017   Lab Results  Component Value Date   RBC 4.65 03/06/2017   No results found for: KPAFRELGTCHN, LAMBDASER, KAPLAMBRATIO No results found for: IGGSERUM, IGA, IGMSERUM No results found for: Kathrynn Ducking, MSPIKE, SPEI    Chemistry      Component Value Date/Time   NA 146 (H) 01/22/2017 1301   NA 148 (H) 12/30/2016 0803   K 3.8 01/22/2017 1301   K 4.1 12/30/2016 0803   CL 94 (L) 01/22/2017 1301   CO2 36 (H) 01/22/2017 1301   CO2 41 (H) 12/30/2016 0803   BUN 8 01/22/2017 1301   BUN 8.2 12/30/2016 0803   CREATININE 0.7 01/22/2017 1301   CREATININE 0.6 12/30/2016 0803   GLU 105 12/25/2016      Component Value Date/Time   CALCIUM 9.9 01/22/2017 1301   CALCIUM 10.0 12/30/2016 0803   ALKPHOS 56 01/22/2017 1301   ALKPHOS 67 12/30/2016 0803   AST 50 (H) 01/22/2017 1301   AST 44 (H) 12/30/2016 0803   ALT 23 01/22/2017 1301   ALT 24 12/30/2016 0803   BILITOT 0.60 01/22/2017 1301   BILITOT 0.39 12/30/2016 0803  Impression and Plan: Holly Page is a pleasant 82 yo [place] American female with multiple health issues. She has had multiple DVT's and a superficial blood clot in her left arm. She now has bilateral pulmonary emboli and life long anticoagulation with Eliquis 5 mg PO BID. No episodes of bleeding.  She is on supplemental O2 at 2 L [place] 24 hours a day.  We will repeat another CT angio in 2 weeks and then plan to see her back for follow-up in 6 weeks.  Both she and her son know to contact our office with any questions or concerns. We can certainly see her sooner if need be.   Eliezer Bottom, NP 6/8/20181:31 PM

## 2017-03-09 ENCOUNTER — Ambulatory Visit (INDEPENDENT_AMBULATORY_CARE_PROVIDER_SITE_OTHER): Payer: Medicare Other | Admitting: Orthopaedic Surgery

## 2017-03-09 LAB — FERRITIN: FERRITIN: 144 ng/mL (ref 9–269)

## 2017-03-09 LAB — IRON AND TIBC
%SAT: 19 % — AB (ref 21–57)
IRON: 72 ug/dL (ref 41–142)
TIBC: 378 ug/dL (ref 236–444)
UIBC: 305 ug/dL (ref 120–384)

## 2017-03-10 ENCOUNTER — Telehealth: Payer: Self-pay | Admitting: *Deleted

## 2017-03-10 DIAGNOSIS — I700 Atherosclerosis of aorta: Secondary | ICD-10-CM | POA: Diagnosis not present

## 2017-03-10 DIAGNOSIS — J9612 Chronic respiratory failure with hypercapnia: Secondary | ICD-10-CM | POA: Diagnosis not present

## 2017-03-10 DIAGNOSIS — I2510 Atherosclerotic heart disease of native coronary artery without angina pectoris: Secondary | ICD-10-CM | POA: Diagnosis not present

## 2017-03-10 DIAGNOSIS — J439 Emphysema, unspecified: Secondary | ICD-10-CM | POA: Diagnosis not present

## 2017-03-10 DIAGNOSIS — D329 Benign neoplasm of meninges, unspecified: Secondary | ICD-10-CM | POA: Diagnosis not present

## 2017-03-10 DIAGNOSIS — E43 Unspecified severe protein-calorie malnutrition: Secondary | ICD-10-CM | POA: Diagnosis not present

## 2017-03-10 DIAGNOSIS — J180 Bronchopneumonia, unspecified organism: Secondary | ICD-10-CM | POA: Diagnosis not present

## 2017-03-10 DIAGNOSIS — R1312 Dysphagia, oropharyngeal phase: Secondary | ICD-10-CM | POA: Diagnosis not present

## 2017-03-10 DIAGNOSIS — J9611 Chronic respiratory failure with hypoxia: Secondary | ICD-10-CM | POA: Diagnosis not present

## 2017-03-10 DIAGNOSIS — D509 Iron deficiency anemia, unspecified: Secondary | ICD-10-CM | POA: Diagnosis not present

## 2017-03-10 DIAGNOSIS — M5137 Other intervertebral disc degeneration, lumbosacral region: Secondary | ICD-10-CM | POA: Diagnosis not present

## 2017-03-10 DIAGNOSIS — I10 Essential (primary) hypertension: Secondary | ICD-10-CM | POA: Diagnosis not present

## 2017-03-10 DIAGNOSIS — I2609 Other pulmonary embolism with acute cor pulmonale: Secondary | ICD-10-CM | POA: Diagnosis not present

## 2017-03-10 NOTE — Telephone Encounter (Addendum)
Patient aware of results. Patient refuses IV iron. She will take oral iron and see if that helps. Laverna Peace NP aware of patient decision.   ----- Message from Eliezer Bottom, NP sent at 03/10/2017  9:54 AM EDT ----- Regarding: Iron  She will need one dose of IV iron this week. LOS sent to [facility]. Thank you!  [name]  ----- Message ----- From: Interface, Lab In Three Zero One Sent: 03/06/2017   1:21 PM To: Eliezer Bottom, NP

## 2017-03-11 ENCOUNTER — Telehealth: Payer: Self-pay | Admitting: Family Medicine

## 2017-03-11 DIAGNOSIS — D509 Iron deficiency anemia, unspecified: Secondary | ICD-10-CM | POA: Diagnosis not present

## 2017-03-11 DIAGNOSIS — J9611 Chronic respiratory failure with hypoxia: Secondary | ICD-10-CM | POA: Diagnosis not present

## 2017-03-11 DIAGNOSIS — R1312 Dysphagia, oropharyngeal phase: Secondary | ICD-10-CM | POA: Diagnosis not present

## 2017-03-11 DIAGNOSIS — I2609 Other pulmonary embolism with acute cor pulmonale: Secondary | ICD-10-CM | POA: Diagnosis not present

## 2017-03-11 DIAGNOSIS — J439 Emphysema, unspecified: Secondary | ICD-10-CM | POA: Diagnosis not present

## 2017-03-11 DIAGNOSIS — D329 Benign neoplasm of meninges, unspecified: Secondary | ICD-10-CM | POA: Diagnosis not present

## 2017-03-11 DIAGNOSIS — M5137 Other intervertebral disc degeneration, lumbosacral region: Secondary | ICD-10-CM | POA: Diagnosis not present

## 2017-03-11 DIAGNOSIS — I700 Atherosclerosis of aorta: Secondary | ICD-10-CM | POA: Diagnosis not present

## 2017-03-11 DIAGNOSIS — J9612 Chronic respiratory failure with hypercapnia: Secondary | ICD-10-CM | POA: Diagnosis not present

## 2017-03-11 DIAGNOSIS — I2510 Atherosclerotic heart disease of native coronary artery without angina pectoris: Secondary | ICD-10-CM | POA: Diagnosis not present

## 2017-03-11 DIAGNOSIS — I10 Essential (primary) hypertension: Secondary | ICD-10-CM | POA: Diagnosis not present

## 2017-03-11 DIAGNOSIS — E43 Unspecified severe protein-calorie malnutrition: Secondary | ICD-10-CM | POA: Diagnosis not present

## 2017-03-11 DIAGNOSIS — J180 Bronchopneumonia, unspecified organism: Secondary | ICD-10-CM | POA: Diagnosis not present

## 2017-03-11 NOTE — Telephone Encounter (Signed)
Caller name: Romelle Starcher Relationship to patient: [facility] Can be reached: [phone] Pharmacy:  Reason for call: Verbal orders for PT 2 times a week for 4 weeks beginning 6/10 thru 7/6 for balancing, endurance and ambulation

## 2017-03-11 NOTE — Telephone Encounter (Signed)
Verbal orders given  

## 2017-03-11 NOTE — Telephone Encounter (Signed)
° °  Also need verbal orders for [facility] nursing once a week for 9 weeks.  May leave message on vm. Olin Hauser  [phone]

## 2017-03-11 NOTE — Telephone Encounter (Signed)
Tried to Licensed conveyancer at [place]. No answer, left message for return call.

## 2017-03-12 ENCOUNTER — Encounter: Payer: Self-pay | Admitting: Adult Health

## 2017-03-12 ENCOUNTER — Ambulatory Visit (INDEPENDENT_AMBULATORY_CARE_PROVIDER_SITE_OTHER): Payer: Medicare Other | Admitting: Adult Health

## 2017-03-12 DIAGNOSIS — J9612 Chronic respiratory failure with hypercapnia: Secondary | ICD-10-CM | POA: Diagnosis not present

## 2017-03-12 DIAGNOSIS — M5137 Other intervertebral disc degeneration, lumbosacral region: Secondary | ICD-10-CM | POA: Diagnosis not present

## 2017-03-12 DIAGNOSIS — R1312 Dysphagia, oropharyngeal phase: Secondary | ICD-10-CM | POA: Diagnosis not present

## 2017-03-12 DIAGNOSIS — I2510 Atherosclerotic heart disease of native coronary artery without angina pectoris: Secondary | ICD-10-CM | POA: Diagnosis not present

## 2017-03-12 DIAGNOSIS — J439 Emphysema, unspecified: Secondary | ICD-10-CM | POA: Diagnosis not present

## 2017-03-12 DIAGNOSIS — J9611 Chronic respiratory failure with hypoxia: Secondary | ICD-10-CM | POA: Diagnosis not present

## 2017-03-12 DIAGNOSIS — D509 Iron deficiency anemia, unspecified: Secondary | ICD-10-CM | POA: Diagnosis not present

## 2017-03-12 DIAGNOSIS — E43 Unspecified severe protein-calorie malnutrition: Secondary | ICD-10-CM | POA: Diagnosis not present

## 2017-03-12 DIAGNOSIS — J180 Bronchopneumonia, unspecified organism: Secondary | ICD-10-CM | POA: Diagnosis not present

## 2017-03-12 DIAGNOSIS — D329 Benign neoplasm of meninges, unspecified: Secondary | ICD-10-CM | POA: Diagnosis not present

## 2017-03-12 DIAGNOSIS — I10 Essential (primary) hypertension: Secondary | ICD-10-CM

## 2017-03-12 DIAGNOSIS — I2609 Other pulmonary embolism with acute cor pulmonale: Secondary | ICD-10-CM | POA: Diagnosis not present

## 2017-03-12 DIAGNOSIS — I700 Atherosclerosis of aorta: Secondary | ICD-10-CM | POA: Diagnosis not present

## 2017-03-12 MED ORDER — FLUTICASONE FUROATE-VILANTEROL 200-25 MCG/INH IN AEPB: 1.0000 | INHALATION_SPRAY | Freq: Every day | RESPIRATORY_TRACT | 0 refills | Status: AC

## 2017-03-12 NOTE — Assessment & Plan Note (Signed)
Not at goal  No apparent sx  Need PCP follow up .

## 2017-03-12 NOTE — Assessment & Plan Note (Signed)
Cont on O2 to keep O2 sats >88-90%.

## 2017-03-12 NOTE — Assessment & Plan Note (Signed)
Very severe COPD /GOLD D  Compensated without flare   Plan  Patient Instructions  Continue on BREO 1 puff daily , rinse after use.   Continue on Albuterol Neb As needed   Continue on Oxygen 2l/m .  Add ensure Twice daily  Between meals.  Your blood pressure is elevated, please follow up with Primary doctor for this.  Follow up in 6-8 as planned and As needed    Please contact office for sooner follow up if symptoms do not improve or worsen or seek emergency care

## 2017-03-12 NOTE — Patient Instructions (Addendum)
Continue on BREO 1 puff daily , rinse after use.   Continue on Albuterol Neb As needed   Continue on Oxygen 2l/m .  Add ensure Twice daily  Between meals.  Your blood pressure is elevated, please follow up with Primary doctor for this.  Follow up in 6-8 weeks  as planned and As needed    Please contact office for sooner follow up if symptoms do not improve or worsen or seek emergency care

## 2017-03-12 NOTE — Progress Notes (Signed)
@Patient  ID: Holly Page, female    DOB: Oct 30, 1935, 81 y.o.   MRN: [id number]  Chief Complaint  Patient presents with  . Follow-up    COPD     Referring provider: Copland, Gay Filler, MD  HPI: 81y.o. F with Gold D Copd primary emphysema on O2 >GOLD card patient  Hx of DVT (2015) , superficial Vein thrombus 2016 , on xarelto , followed by Hematology  PE 11/2016 (with Influenza and PNA) -life long anticoagulation  TEST /Events  Hx of DVT -05/2014 (tx w/ xarelto until 12/2014 ) -followed by hematology  Left arm superficial venous thrombus , Xarelto 01/25/15 >followed by Hematology >stopped 07/2015  CT chest, on March 19 2015 >that showed no evidence of pulmonary embolism and stable. Severe COPD changes. Last spirometry showed FEV1 at 30% in 2015.  Intolerant to [place] ,[place]  Incruse and tudorza , Psychologist, forensic  Oxygen does drop with pulsing O2 on 2 L was able to keep above 90% on continuous flow  Cardiology eval HP per pt with a negative stress test recently.  Pulmonary rehab 2015 at [facility]   03/12/2017 Follow up : COPD , O2 RF , PE  81y.o. Patient presents for a two-week follow-up. Patient says that she continues to be short of breath with minimal activity. She remains on BREO daily. She uses albuterol nebs at least 3-4 times a day.  Patient had a  hospitalization in March 2018 with pulmonary embolism. In the setting of influenza and pneumonia. She remains on Xarelto and will need lifelong anticoagulation. She denies any known bleeding. Says that since this hospitalization. She has been weaker. She is currently undergoing physical therapy. She is with her son today who helps with her. She remains on oxygen and has been able to decrease her oxygen to 2 L. Previous it was on 3-4 L. She denies any hemoptysis, chest pain, orthopnea, PND, or increased leg swelling. She continues to struggle to gain weight. She is trying ensure .     Allergies  Allergen Reactions  . Aspirin Other  (See Comments)    Other reaction(s): PALPITATIONS Other reaction(s): DIFFICULTY BREATHING Heart flutter  . Incruse Ellipta [Umeclidinium Bromide] Shortness Of Breath  . Tramadol Shortness Of Breath  . Amlodipine Other (See Comments)    Other reaction(s): SWELLING  . Codeine Other (See Comments)    Hallucinations, can take Tylenol #3 now  . Doxycycline Other (See Comments)    Other reaction(s): NAUSEA,VOMITING  . Gabapentin Other (See Comments)    Other reaction(s): Other (See Comments) She could not swallow the large pill  . Hydrocodone Other (See Comments)    hallucinations  . Losartan Other (See Comments)    unknown  . Propoxyphene Nausea Only  . Tiotropium Itching and Rash    Immunization History  Administered Date(s) Administered  . Pneumococcal Conjugate-13 08/09/2015  . Pneumococcal Polysaccharide-23 09/29/2001, 09/29/2008, 04/26/2012  . Td 03/28/2009  . Tdap 04/04/2008    Past Medical History:  Diagnosis Date  . Anemia   . Arthritis   . CAD (coronary artery disease)   . Clotting disorder ([place])   . COPD (chronic obstructive pulmonary disease) ([place])   . DVT (deep venous thrombosis) ([place]) 1980s, recurrent 2015   Right DVT 2015  on xarelto  . Fibroid   . GERD (gastroesophageal reflux disease)   . Hypertension   . Hypertension   . Iron deficiency anemia due to chronic blood loss 01/23/2017  . 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 Kidney stone   .  Lactose intolerance   . Meningioma ([place])    followed by Dr Christella Noa  . Pneumonia   . Spinal stenosis     Tobacco History: History  Smoking Status  . Former Smoker  . Packs/day: 1.50  . Years: 56.00  . Types: Cigarettes  . Quit date: 04/29/2012  Smokeless Tobacco  . Never Used    Comment: quit smoking 3 years ago   Counseling given: Not Answered   Outpatient Encounter Prescriptions as of 03/12/2017  Medication Sig  . acetaminophen-codeine (TYLENOL #3) 300-30 MG tablet Take 1 tablet by mouth every 6 (six) hours as  needed for moderate pain.  . Acidophilus Lactobacillus CAPS Take 1 capsule by mouth daily with breakfast.  . albuterol (PROVENTIL HFA;VENTOLIN HFA) 108 (90 Base) MCG/ACT inhaler Inhale 2 puffs into the lungs every 4 (four) hours as needed for wheezing or shortness of breath.  [name] albuterol (PROVENTIL) (2.5 MG/3ML) 0.083% nebulizer solution Take 3 mLs (2.5 mg total) by nebulization every 6 (six) hours as needed for wheezing or shortness of breath.  [name] apixaban (ELIQUIS) 5 MG TABS tablet Take 1 tablet (5 mg total) by mouth 2 (two) times daily.  . cholecalciferol (VITAMIN D) 1000 units tablet Take 1,000 Units by mouth daily.  . fluticasone furoate-vilanterol (BREO ELLIPTA) 200-25 MCG/INH AEPB Inhale 1 puff into the lungs daily.  . hydrALAZINE (APRESOLINE) 10 MG tablet Take 10 mg by mouth every 8 (eight) hours.  . Lactobacillus Acidophilus POWD Take by mouth.  . Multiple Vitamins-Minerals (MULTIVITAMIN & MINERAL PO) Take 1 tablet by mouth daily.  . Nutritional Supplements (ENSURE ENLIVE PO) Take 237 mLs by mouth 2 (two) times daily.  . OXYGEN Inhale 2 L into the lungs daily as needed. To maintain o2 saturation of 90%  . ranitidine (ZANTAC) 300 MG capsule Take 1 capsule (300 mg total) by mouth every evening.  . fluticasone furoate-vilanterol (BREO ELLIPTA) 200-25 MCG/INH AEPB Inhale 1 puff into the lungs daily.   No facility-administered encounter medications on file as of 03/12/2017.      Review of Systems  Constitutional:   No   night sweats,  Fevers, chills,  +fatigue, or  lassitude.  HEENT:   No headaches,  Difficulty swallowing,  Tooth/dental problems, or  Sore throat,                No sneezing, itching, ear ache, nasal congestion, post nasal drip,   CV:  No chest pain,  Orthopnea, PND, swelling in lower extremities, anasarca, dizziness, palpitations, syncope.   GI  No heartburn, indigestion, abdominal pain, nausea, vomiting,   loss of appetite, bloody stools.   Resp:  .  No chest wall  deformity  Skin: no rash or lesions.  GU: no dysuria, change in color of urine, no urgency or frequency.  No flank pain, no hematuria   MS:  No joint pain or swelling.  No decreased range of motion.  No back pain.    Physical Exam  BP (!) 150/84 (BP Location: Left Arm, Patient Position: Sitting, Cuff Size: Normal)   Pulse 79   Temp 98 F (36.7 C) (Oral)   Ht 5' 5.5" (1.664 m)   Wt 89 lb (40.4 kg)   SpO2 92%   BMI 14.59 kg/m   GEN: A/Ox3; pleasant , NAD, thin and frail    HEENT:  [place]/AT,  EACs-clear, TMs-wnl, NOSE-clear, THROAT-clear, no lesions, no postnasal drip or exudate noted.   NECK:  Supple w/ fair ROM; no JVD; normal carotid impulses w/o  bruits; no thyromegaly or nodules palpated; no lymphadenopathy.    RESP  Decreased BS in bases  w/o, wheezes/ rales/ or rhonchi. no accessory muscle use, no dullness to percussion  CARD:  RRR, no m/r/g, no peripheral edema, pulses intact, no cyanosis or clubbing.  GI:   Soft & nt; nml bowel sounds; no organomegaly or masses detected.   Musco: Warm bil, no deformities or joint swelling noted.   Neuro: alert, no focal deficits noted.    Skin: Warm, no lesions or rashes    Lab Results:  CBC    Component Value Date/Time   WBC 5.6 03/06/2017 1310   WBC 7.9 01/12/2017 1218   RBC 4.65 03/06/2017 1310   RBC 4.21 01/12/2017 1218   HGB 10.6 (L) 03/06/2017 1310   HCT 35.9 03/06/2017 1310   PLT 290 03/06/2017 1310   MCV 77 (L) 03/06/2017 1310   MCH 22.8 (L) 03/06/2017 1310   MCH 22.0 (L) 07/24/2016 0408   MCHC 29.5 (L) 03/06/2017 1310   MCHC 29.4 Repeated and verified X2. (L) 01/12/2017 1218   RDW 15.5 03/06/2017 1310   LYMPHSABS 1.9 03/06/2017 1310   MONOABS 0.2 07/21/2016 1145   EOSABS 0.2 03/06/2017 1310   BASOSABS 0.0 03/06/2017 1310    BMET    Component Value Date/Time   NA 145 03/06/2017 1311   NA 148 (H) 12/30/2016 0803   K 3.7 03/06/2017 1311   K 4.1 12/30/2016 0803   CL 102 03/06/2017 1311   CO2 36 (H)  03/06/2017 1311   CO2 41 (H) 12/30/2016 0803   GLUCOSE 92 03/06/2017 1311   BUN 10 03/06/2017 1311   BUN 8.2 12/30/2016 0803   CREATININE 0.5 (L) 03/06/2017 1311   CREATININE 0.6 12/30/2016 0803   CALCIUM 9.7 03/06/2017 1311   CALCIUM 10.0 12/30/2016 0803   GFRNONAA >60 07/24/2016 0408   GFRAA >60 07/24/2016 0408    BNP    Component Value Date/Time   BNP 35.2 03/19/2016 1140    ProBNP    Component Value Date/Time   PROBNP 49.2 09/14/2014 1246    Imaging: No results found.   Assessment & Plan:   COPD with emphysema Gold D Very severe COPD /GOLD D  Compensated without flare   Plan  Patient Instructions  Continue on BREO 1 puff daily , rinse after use.   Continue on Albuterol Neb As needed   Continue on Oxygen 2l/m .  Add ensure Twice daily  Between meals.  Your blood pressure is elevated, please follow up with Primary doctor for this.  Follow up in 6-8 as planned and As needed    Please contact office for sooner follow up if symptoms do not improve or worsen or seek emergency care      Chronic respiratory failure ([place]) Cont on O2 to keep O2 sats >88-90%.   HTN (hypertension) Not at goal  No apparent sx  Need PCP follow up .      Rexene Edison, NP 03/12/2017

## 2017-03-13 ENCOUNTER — Other Ambulatory Visit: Payer: Medicare Other

## 2017-03-13 ENCOUNTER — Telehealth: Payer: Self-pay | Admitting: *Deleted

## 2017-03-13 ENCOUNTER — Ambulatory Visit: Payer: Medicare Other | Admitting: Hematology & Oncology

## 2017-03-13 NOTE — Telephone Encounter (Signed)
Received Physician Orders [2nd] from [facility], forwarded to provider/SLS 06/15

## 2017-03-13 NOTE — Telephone Encounter (Signed)
Received Physician Orders from [facility], forwarded to provider/SLS 06/15

## 2017-03-14 NOTE — Progress Notes (Signed)
Reviewed & agree with plan  

## 2017-03-16 ENCOUNTER — Other Ambulatory Visit (INDEPENDENT_AMBULATORY_CARE_PROVIDER_SITE_OTHER): Payer: Medicare Other

## 2017-03-16 DIAGNOSIS — Z5181 Encounter for therapeutic drug level monitoring: Secondary | ICD-10-CM

## 2017-03-16 LAB — BASIC METABOLIC PANEL
BUN: 14 mg/dL (ref 6–23)
CALCIUM: 9.7 mg/dL (ref 8.4–10.5)
CO2: 39 mEq/L — ABNORMAL HIGH (ref 19–32)
Chloride: 100 mEq/L (ref 96–112)
Creatinine, Ser: 0.5 mg/dL (ref 0.40–1.20)
GFR: 153.15 mL/min (ref >60.00)
GLUCOSE: 93 mg/dL (ref 70–99)
POTASSIUM: 3.6 meq/L (ref 3.5–5.1)
Sodium: 144 mEq/L (ref 135–145)

## 2017-03-17 ENCOUNTER — Encounter: Payer: Self-pay | Admitting: Family Medicine

## 2017-03-17 DIAGNOSIS — E43 Unspecified severe protein-calorie malnutrition: Secondary | ICD-10-CM | POA: Diagnosis not present

## 2017-03-17 DIAGNOSIS — I2609 Other pulmonary embolism with acute cor pulmonale: Secondary | ICD-10-CM | POA: Diagnosis not present

## 2017-03-17 DIAGNOSIS — D329 Benign neoplasm of meninges, unspecified: Secondary | ICD-10-CM | POA: Diagnosis not present

## 2017-03-17 DIAGNOSIS — I2510 Atherosclerotic heart disease of native coronary artery without angina pectoris: Secondary | ICD-10-CM | POA: Diagnosis not present

## 2017-03-17 DIAGNOSIS — J9612 Chronic respiratory failure with hypercapnia: Secondary | ICD-10-CM | POA: Diagnosis not present

## 2017-03-17 DIAGNOSIS — I700 Atherosclerosis of aorta: Secondary | ICD-10-CM | POA: Diagnosis not present

## 2017-03-17 DIAGNOSIS — J9611 Chronic respiratory failure with hypoxia: Secondary | ICD-10-CM | POA: Diagnosis not present

## 2017-03-17 DIAGNOSIS — J439 Emphysema, unspecified: Secondary | ICD-10-CM | POA: Diagnosis not present

## 2017-03-17 DIAGNOSIS — R1312 Dysphagia, oropharyngeal phase: Secondary | ICD-10-CM | POA: Diagnosis not present

## 2017-03-17 DIAGNOSIS — M5137 Other intervertebral disc degeneration, lumbosacral region: Secondary | ICD-10-CM | POA: Diagnosis not present

## 2017-03-17 DIAGNOSIS — J180 Bronchopneumonia, unspecified organism: Secondary | ICD-10-CM | POA: Diagnosis not present

## 2017-03-17 DIAGNOSIS — D509 Iron deficiency anemia, unspecified: Secondary | ICD-10-CM | POA: Diagnosis not present

## 2017-03-17 DIAGNOSIS — I10 Essential (primary) hypertension: Secondary | ICD-10-CM | POA: Diagnosis not present

## 2017-03-18 DIAGNOSIS — I10 Essential (primary) hypertension: Secondary | ICD-10-CM | POA: Diagnosis not present

## 2017-03-18 DIAGNOSIS — J439 Emphysema, unspecified: Secondary | ICD-10-CM | POA: Diagnosis not present

## 2017-03-18 DIAGNOSIS — M5137 Other intervertebral disc degeneration, lumbosacral region: Secondary | ICD-10-CM | POA: Diagnosis not present

## 2017-03-18 DIAGNOSIS — J180 Bronchopneumonia, unspecified organism: Secondary | ICD-10-CM | POA: Diagnosis not present

## 2017-03-18 DIAGNOSIS — J9612 Chronic respiratory failure with hypercapnia: Secondary | ICD-10-CM | POA: Diagnosis not present

## 2017-03-18 DIAGNOSIS — I700 Atherosclerosis of aorta: Secondary | ICD-10-CM | POA: Diagnosis not present

## 2017-03-18 DIAGNOSIS — D329 Benign neoplasm of meninges, unspecified: Secondary | ICD-10-CM | POA: Diagnosis not present

## 2017-03-18 DIAGNOSIS — J9611 Chronic respiratory failure with hypoxia: Secondary | ICD-10-CM | POA: Diagnosis not present

## 2017-03-18 DIAGNOSIS — E43 Unspecified severe protein-calorie malnutrition: Secondary | ICD-10-CM | POA: Diagnosis not present

## 2017-03-18 DIAGNOSIS — I2609 Other pulmonary embolism with acute cor pulmonale: Secondary | ICD-10-CM | POA: Diagnosis not present

## 2017-03-18 DIAGNOSIS — R1312 Dysphagia, oropharyngeal phase: Secondary | ICD-10-CM | POA: Diagnosis not present

## 2017-03-18 DIAGNOSIS — I2510 Atherosclerotic heart disease of native coronary artery without angina pectoris: Secondary | ICD-10-CM | POA: Diagnosis not present

## 2017-03-18 DIAGNOSIS — D509 Iron deficiency anemia, unspecified: Secondary | ICD-10-CM | POA: Diagnosis not present

## 2017-03-19 DIAGNOSIS — E43 Unspecified severe protein-calorie malnutrition: Secondary | ICD-10-CM | POA: Diagnosis not present

## 2017-03-19 DIAGNOSIS — I2609 Other pulmonary embolism with acute cor pulmonale: Secondary | ICD-10-CM | POA: Diagnosis not present

## 2017-03-19 DIAGNOSIS — R1312 Dysphagia, oropharyngeal phase: Secondary | ICD-10-CM | POA: Diagnosis not present

## 2017-03-19 DIAGNOSIS — J180 Bronchopneumonia, unspecified organism: Secondary | ICD-10-CM | POA: Diagnosis not present

## 2017-03-19 DIAGNOSIS — J9611 Chronic respiratory failure with hypoxia: Secondary | ICD-10-CM | POA: Diagnosis not present

## 2017-03-19 DIAGNOSIS — I700 Atherosclerosis of aorta: Secondary | ICD-10-CM | POA: Diagnosis not present

## 2017-03-19 DIAGNOSIS — J439 Emphysema, unspecified: Secondary | ICD-10-CM | POA: Diagnosis not present

## 2017-03-19 DIAGNOSIS — D329 Benign neoplasm of meninges, unspecified: Secondary | ICD-10-CM | POA: Diagnosis not present

## 2017-03-19 DIAGNOSIS — J9612 Chronic respiratory failure with hypercapnia: Secondary | ICD-10-CM | POA: Diagnosis not present

## 2017-03-19 DIAGNOSIS — I10 Essential (primary) hypertension: Secondary | ICD-10-CM | POA: Diagnosis not present

## 2017-03-19 DIAGNOSIS — I2510 Atherosclerotic heart disease of native coronary artery without angina pectoris: Secondary | ICD-10-CM | POA: Diagnosis not present

## 2017-03-19 DIAGNOSIS — M5137 Other intervertebral disc degeneration, lumbosacral region: Secondary | ICD-10-CM | POA: Diagnosis not present

## 2017-03-19 DIAGNOSIS — D509 Iron deficiency anemia, unspecified: Secondary | ICD-10-CM | POA: Diagnosis not present

## 2017-03-20 ENCOUNTER — Telehealth: Payer: Self-pay | Admitting: *Deleted

## 2017-03-20 ENCOUNTER — Encounter (HOSPITAL_BASED_OUTPATIENT_CLINIC_OR_DEPARTMENT_OTHER): Payer: Self-pay

## 2017-03-20 ENCOUNTER — Telehealth: Payer: Self-pay | Admitting: Family Medicine

## 2017-03-20 ENCOUNTER — Ambulatory Visit (HOSPITAL_BASED_OUTPATIENT_CLINIC_OR_DEPARTMENT_OTHER)
Admission: RE | Admit: 2017-03-20 | Discharge: 2017-03-20 | Disposition: A | Discharge status: Home / Self Care | Payer: Medicare Other | Source: Ambulatory Visit | Attending: Family | Admitting: Family

## 2017-03-20 DIAGNOSIS — J9811 Atelectasis: Secondary | ICD-10-CM | POA: Diagnosis not present

## 2017-03-20 DIAGNOSIS — I700 Atherosclerosis of aorta: Secondary | ICD-10-CM | POA: Diagnosis not present

## 2017-03-20 DIAGNOSIS — I2609 Other pulmonary embolism with acute cor pulmonale: Secondary | ICD-10-CM

## 2017-03-20 DIAGNOSIS — D500 Iron deficiency anemia secondary to blood loss (chronic): Secondary | ICD-10-CM | POA: Insufficient documentation

## 2017-03-20 DIAGNOSIS — J439 Emphysema, unspecified: Secondary | ICD-10-CM | POA: Diagnosis not present

## 2017-03-20 DIAGNOSIS — I2699 Other pulmonary embolism without acute cor pulmonale: Secondary | ICD-10-CM | POA: Diagnosis not present

## 2017-03-20 DIAGNOSIS — Z86718 Personal history of other venous thrombosis and embolism: Secondary | ICD-10-CM | POA: Diagnosis not present

## 2017-03-20 DIAGNOSIS — I2782 Chronic pulmonary embolism: Secondary | ICD-10-CM | POA: Diagnosis present

## 2017-03-20 MED ORDER — IOPAMIDOL (ISOVUE-370) INJECTION 76%
100.0000 mL | Freq: Once | INTRAVENOUS | Status: AC | PRN
  Administered 2017-03-20: 100 mL via INTRAVENOUS

## 2017-03-20 NOTE — Telephone Encounter (Signed)
Pt wanted to know lab results for Potassium. Informed pt her Potassium level is  3.6 which is normal. Pt states that she stopped taking her Potassium supplement on June 4th and would like to know if she should start back taking Potassium or not. Pt states if she does need to take it she will need a refill sent in.  Please advise.

## 2017-03-20 NOTE — Telephone Encounter (Signed)
Called her back- she has been off her potassium for about 2 weeks and is doing fine.  Will continue to NOT take potassium.  She will see me in one month for an office visit

## 2017-03-20 NOTE — Telephone Encounter (Signed)
Relation to pt: self  Call back number: [phone] Pharmacy: [id number] - [place], [place] - [address] AT [address] [phone] (Phone) [phone] (Fax)     Reason for call:  Patient inquiring about lab results, patient concerned stating the outcome of her lab results would determine if she should resume her potassium chloride SA (K-DUR,KLOR-CON) CR tablet 40 mEq, informed patient MD is out of the office, patient voice understanding and would like to speak with a nurse today regarding the side effects for being off her potassium chloride,please advise

## 2017-03-20 NOTE — Telephone Encounter (Addendum)
Patient is aware of results  ----- Message from Eliezer Bottom, NP sent at 03/20/2017  4:09 PM EDT ----- Regarding: CT angio PE has resolved. Thank you!  [name]  ----- Message ----- From: Buel Ream, Rad Results In Sent: 03/20/2017   9:39 AM To: Eliezer Bottom, NP

## 2017-03-24 DIAGNOSIS — J9612 Chronic respiratory failure with hypercapnia: Secondary | ICD-10-CM | POA: Diagnosis not present

## 2017-03-24 DIAGNOSIS — D509 Iron deficiency anemia, unspecified: Secondary | ICD-10-CM | POA: Diagnosis not present

## 2017-03-24 DIAGNOSIS — J180 Bronchopneumonia, unspecified organism: Secondary | ICD-10-CM | POA: Diagnosis not present

## 2017-03-24 DIAGNOSIS — I10 Essential (primary) hypertension: Secondary | ICD-10-CM | POA: Diagnosis not present

## 2017-03-24 DIAGNOSIS — I2510 Atherosclerotic heart disease of native coronary artery without angina pectoris: Secondary | ICD-10-CM | POA: Diagnosis not present

## 2017-03-24 DIAGNOSIS — E43 Unspecified severe protein-calorie malnutrition: Secondary | ICD-10-CM | POA: Diagnosis not present

## 2017-03-24 DIAGNOSIS — I700 Atherosclerosis of aorta: Secondary | ICD-10-CM | POA: Diagnosis not present

## 2017-03-24 DIAGNOSIS — J439 Emphysema, unspecified: Secondary | ICD-10-CM | POA: Diagnosis not present

## 2017-03-24 DIAGNOSIS — I2609 Other pulmonary embolism with acute cor pulmonale: Secondary | ICD-10-CM | POA: Diagnosis not present

## 2017-03-24 DIAGNOSIS — J9611 Chronic respiratory failure with hypoxia: Secondary | ICD-10-CM | POA: Diagnosis not present

## 2017-03-24 DIAGNOSIS — M5137 Other intervertebral disc degeneration, lumbosacral region: Secondary | ICD-10-CM | POA: Diagnosis not present

## 2017-03-24 DIAGNOSIS — R1312 Dysphagia, oropharyngeal phase: Secondary | ICD-10-CM | POA: Diagnosis not present

## 2017-03-24 DIAGNOSIS — D329 Benign neoplasm of meninges, unspecified: Secondary | ICD-10-CM | POA: Diagnosis not present

## 2017-03-24 NOTE — Progress Notes (Signed)
Holly Page at [facility] [address] [place], [place] [address] [phone]  Date:  03/25/2017   Name:  Holly Page   DOB:  08-23-1936   MRN:  [id number]  PCP:  Holly Mclean, MD    Chief Complaint: Follow-up (Pt here to f/u on bp. Pt states that bp has been high when checking at home. )   History of Present Illness:  Holly Page is a 81 y.o. very pleasant female patient who presents with the following:  Here today for a follow-up visit Seen by pulmonology earlier this month:  80y.o. F with Gold D Copd primary emphysema on O2 >GOLD card patient  Hx of DVT (2015) , superficial Vein thrombus 2016 , on xarelto , followed by Hematology  PE 11/2016 (with Influenza and PNA) -life long anticoagulation  Pt states that her BP has been "up and down, and my heart rate is up and down too."  Notes that her BP at home may fluctuate which she finds very concerning.  She quotes me some BP and pulse readings from home which are all in an acceptable range  BP Readings from Last 3 Encounters:  03/25/17 127/61  03/12/17 (!) 150/84  03/06/17 (!) 141/70   Pulse Readings from Last 3 Encounters:  03/25/17 89  03/12/17 79  03/06/17 93   She is here today with her son States that she is NOT taking hydralazine but she is taking verapamil CR 240  continues to wonder/ask why she feels bad, why she can't gain weight and why she is weak.  Again explained to her that these sx are likely due to her very severe oxygen dependent COPD and associated malnutrition   NCCSR: no entries  Patient Active Problem List   Diagnosis Date Noted  . Iron deficiency anemia due to chronic blood loss 01/23/2017  . 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 Pulmonary embolus ([place]) 12/30/2016  . Severe sepsis ([place]) 12/26/2016  . Influenza B 12/26/2016  . Acute urinary retention 12/26/2016  . Oropharyngeal dysphagia 12/26/2016  . Metabolic acidosis 76/54/6503   . Polyneuropathy 12/26/2016  . Arthritis   . Clotting disorder ([place])   . CAD (coronary artery disease)   . Fibroid   . Hypertension   . Kidney stone   . Protein-calorie malnutrition, severe 07/23/2016  . Shortness of breath 07/23/2016  . SOB (shortness of breath) 07/21/2016  . Acute on chronic respiratory failure with hypoxia and hypercapnia ([place]) 07/21/2016  . Chronic respiratory failure ([place]) 04/10/2016  . COPD with exacerbation ([place]) 03/19/2016  . RBC microcytosis 03/19/2016  . COPD with acute exacerbation ([place]) 03/19/2016  . Oxygen dependent 12/31/2015  . Anxiety 11/08/2015  . Hyperthyroidism 09/07/2015  . Malnutrition of moderate degree 08/31/2015  . Abnormal LFTs 03/19/2015  . Back pain 03/19/2015  . HTN (hypertension) 03/01/2015  . Anemia 12/19/2014  . Calculus of kidney 08/30/2014  . Hematuria 08/11/2014  . History of DVT (deep vein thrombosis)   . Neuralgia neuritis, sciatic nerve 05/22/2014  . 1st degree AV block 04/24/2014  . History of colon polyps 04/24/2014  . Adaptive colitis 04/24/2014  . APC (atrial premature contractions) 04/24/2014  . Benign neoplasm of meninges ([place]) 02/10/2014  . Barrett esophagus 02/03/2014  . Spinal stenosis 12/15/2013  . Allergic rhinitis 11/21/2013  . COPD with emphysema Gold D   . GERD (gastroesophageal reflux disease)   . Lactose intolerance   . DDD (degenerative disc disease), lumbosacral 06/11/2010  .  Diffuse cerebrovascular disease 06/11/2010    Past Medical History:  Diagnosis Date  . Anemia   . Arthritis   . CAD (coronary artery disease)   . Clotting disorder ([place])   . COPD (chronic obstructive pulmonary disease) ([place])   . DVT (deep venous thrombosis) ([place]) 1980s, recurrent 2015   Right DVT 2015  on xarelto  . Fibroid   . GERD (gastroesophageal reflux disease)   . Hypertension   . Hypertension   . Iron deficiency anemia due to chronic blood loss 01/23/2017  . 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 Kidney stone   .  Lactose intolerance   . Meningioma ([place])    followed by Dr Christella Noa  . Pneumonia   . Spinal stenosis     Past Surgical History:  Procedure Laterality Date  . CHOLECYSTECTOMY    . FOOT SURGERY    . INCISION AND DRAINAGE FOOT    . TONSILLECTOMY AND ADENOIDECTOMY    . TREATMENT FISTULA ANAL    . TUBAL LIGATION      Social History  Substance Use Topics  . Smoking status: Former Smoker    Packs/day: 1.50    Years: 56.00    Types: Cigarettes    Quit date: 04/29/2012  . Smokeless tobacco: Never Used     Comment: quit smoking 3 years ago  . Alcohol use No     Comment: quit drinking beer 2005    Family History  Problem Relation Age of Onset  . COPD Father        smoker deceased.   . Emphysema Father   . Lupus Sister   . Heart attack Son   . Heart disease Son   . Hypertension Son   . Hypertension Son     Allergies  Allergen Reactions  . Aspirin Other (See Comments)    Other reaction(s): PALPITATIONS Other reaction(s): DIFFICULTY BREATHING Heart flutter  . Incruse Ellipta [Umeclidinium Bromide] Shortness Of Breath  . Tramadol Shortness Of Breath  . Amlodipine Other (See Comments)    Other reaction(s): SWELLING  . Codeine Other (See Comments)    Hallucinations, can take Tylenol #3 now  . Doxycycline Other (See Comments)    Other reaction(s): NAUSEA,VOMITING  . Gabapentin Other (See Comments)    Other reaction(s): Other (See Comments) She could not swallow the large pill  . Hydrocodone Other (See Comments)    hallucinations  . Losartan Other (See Comments)    unknown  . Propoxyphene Nausea Only  . Tiotropium Itching and Rash    Medication list has been reviewed and updated.  Current Outpatient Prescriptions on File Prior to Visit  Medication Sig Dispense Refill  . acetaminophen-codeine (TYLENOL #3) 300-30 MG tablet Take 1 tablet by mouth every 6 (six) hours as needed for moderate pain. 30 tablet 1  . Acidophilus Lactobacillus CAPS Take 1 capsule by mouth daily  with breakfast.    . albuterol (PROVENTIL HFA;VENTOLIN HFA) 108 (90 Base) MCG/ACT inhaler Inhale 2 puffs into the lungs every 4 (four) hours as needed for wheezing or shortness of breath. 2 Inhaler 6  . albuterol (PROVENTIL) (2.5 MG/3ML) 0.083% nebulizer solution Take 3 mLs (2.5 mg total) by nebulization every 6 (six) hours as needed for wheezing or shortness of breath. 375 mL 0  . apixaban (ELIQUIS) 5 MG TABS tablet Take 1 tablet (5 mg total) by mouth 2 (two) times daily. 60 tablet 11  . cholecalciferol (VITAMIN D) 1000 units tablet Take 1,000 Units by mouth daily.    [name]  fluticasone furoate-vilanterol (BREO ELLIPTA) 200-25 MCG/INH AEPB Inhale 1 puff into the lungs daily. 1 each 0  . fluticasone furoate-vilanterol (BREO ELLIPTA) 200-25 MCG/INH AEPB Inhale 1 puff into the lungs daily. 2 each 0  . hydrALAZINE (APRESOLINE) 10 MG tablet Take 10 mg by mouth every 8 (eight) hours.    . Lactobacillus Acidophilus POWD Take by mouth.    . Multiple Vitamins-Minerals (MULTIVITAMIN & MINERAL PO) Take 1 tablet by mouth daily.    . Nutritional Supplements (ENSURE ENLIVE PO) Take 237 mLs by mouth 2 (two) times daily.    . OXYGEN Inhale 2 L into the lungs daily as needed. To maintain o2 saturation of 90%    . ranitidine (ZANTAC) 300 MG capsule Take 1 capsule (300 mg total) by mouth every evening. 30 capsule 11   No current facility-administered medications on file prior to visit.     Review of Systems: As per HPI- otherwise negative.    Physical Examination: Vitals:   03/25/17 1233  BP: 127/61  Pulse: 89  Temp: 98.5 F (36.9 C)   Vitals:   03/25/17 1233  Weight: 88 lb 3.2 oz (40 kg)  Height: 5' 5.5" (1.664 m)   Body mass index is 14.45 kg/m. Ideal Body Weight: Weight in (lb) to have BMI = 25: 152.2  GEN: WDWN, NAD, Non-toxic, A & O x 3, very thin, cachetic, in WC. Here today with her son HEENT: Atraumatic, Normocephalic. Neck supple. No masses, No LAD. Ears and Nose: No external  deformity. CV: noted some irregularity today, No M/G/R. No JVD. No thrill. No extra heart sounds. PULM: CTA B, no wheezes, crackles, rhonchi. No retractions. No resp. distress. No accessory muscle use. ABD: S, NT, ND, +BS. No rebound. No HSM. EXTR: No c/c/e NEURO in [facility] PSYCH: Normally interactive. Conversant. Not depressed or anxious appearing.  Calm demeanor.   EKG: sinus rhythm with PACs, t wave abnl which appears stable from previous EKG Assessment and Plan: COPD, severe ([place]) - Plan: acetaminophen-codeine (TYLENOL #3) 300-30 MG tablet  Oxygen dependent  Essential hypertension  Physical deconditioning  Loss of weight  Irregular cardiac rhythm - Plan: EKG 12-Lead  Here today with concern about fluctuating BP and pulse readings at home.  Reassured that the numbers she is quoting to me are within normal variation.  Did EKG today to ensure no arrhythmia PACs are present Dawnetta does not seem to have come to terms with the severity of her COPD still Requests a refill of her pain medication and I did fill her tylenol 3 for her- I last gave her 30 with 1 RF on 4/16  Meds ordered this encounter  Medications  . acetaminophen-codeine (TYLENOL #3) 300-30 MG tablet    Sig: Take 1 tablet by mouth every 6 (six) hours as needed for moderate pain.    Dispense:  30 tablet    Refill:  1      Signed Lamar Blinks, MD

## 2017-03-25 ENCOUNTER — Ambulatory Visit (INDEPENDENT_AMBULATORY_CARE_PROVIDER_SITE_OTHER): Payer: Medicare Other | Admitting: Family Medicine

## 2017-03-25 VITALS — BP 127/61 | HR 89 | Temp 98.5°F | Ht 65.5 in | Wt 88.2 lb

## 2017-03-25 DIAGNOSIS — I2609 Other pulmonary embolism with acute cor pulmonale: Secondary | ICD-10-CM | POA: Diagnosis not present

## 2017-03-25 DIAGNOSIS — E43 Unspecified severe protein-calorie malnutrition: Secondary | ICD-10-CM | POA: Diagnosis not present

## 2017-03-25 DIAGNOSIS — D329 Benign neoplasm of meninges, unspecified: Secondary | ICD-10-CM | POA: Diagnosis not present

## 2017-03-25 DIAGNOSIS — R634 Abnormal weight loss: Secondary | ICD-10-CM

## 2017-03-25 DIAGNOSIS — I10 Essential (primary) hypertension: Secondary | ICD-10-CM | POA: Diagnosis not present

## 2017-03-25 DIAGNOSIS — I499 Cardiac arrhythmia, unspecified: Secondary | ICD-10-CM | POA: Diagnosis not present

## 2017-03-25 DIAGNOSIS — J9612 Chronic respiratory failure with hypercapnia: Secondary | ICD-10-CM | POA: Diagnosis not present

## 2017-03-25 DIAGNOSIS — I2510 Atherosclerotic heart disease of native coronary artery without angina pectoris: Secondary | ICD-10-CM | POA: Diagnosis not present

## 2017-03-25 DIAGNOSIS — J449 Chronic obstructive pulmonary disease, unspecified: Secondary | ICD-10-CM

## 2017-03-25 DIAGNOSIS — J9611 Chronic respiratory failure with hypoxia: Secondary | ICD-10-CM | POA: Diagnosis not present

## 2017-03-25 DIAGNOSIS — J180 Bronchopneumonia, unspecified organism: Secondary | ICD-10-CM | POA: Diagnosis not present

## 2017-03-25 DIAGNOSIS — Z9981 Dependence on supplemental oxygen: Secondary | ICD-10-CM | POA: Diagnosis not present

## 2017-03-25 DIAGNOSIS — R5381 Other malaise: Secondary | ICD-10-CM

## 2017-03-25 DIAGNOSIS — I700 Atherosclerosis of aorta: Secondary | ICD-10-CM | POA: Diagnosis not present

## 2017-03-25 DIAGNOSIS — D509 Iron deficiency anemia, unspecified: Secondary | ICD-10-CM | POA: Diagnosis not present

## 2017-03-25 DIAGNOSIS — R1312 Dysphagia, oropharyngeal phase: Secondary | ICD-10-CM | POA: Diagnosis not present

## 2017-03-25 DIAGNOSIS — J439 Emphysema, unspecified: Secondary | ICD-10-CM | POA: Diagnosis not present

## 2017-03-25 DIAGNOSIS — M5137 Other intervertebral disc degeneration, lumbosacral region: Secondary | ICD-10-CM | POA: Diagnosis not present

## 2017-03-25 MED ORDER — ACETAMINOPHEN-CODEINE #3 300-30 MG PO TABS: 1.0000 | ORAL_TABLET | Freq: Four times a day (QID) | ORAL | 1 refills | Status: DC | PRN

## 2017-03-25 NOTE — Patient Instructions (Addendum)
Your EKG today does not show any abnormal heart rhythm just a few early beats, this is ok  Continue to use your verapamil as you are currently  Please come and see me in about 2 months

## 2017-03-26 DIAGNOSIS — D329 Benign neoplasm of meninges, unspecified: Secondary | ICD-10-CM | POA: Diagnosis not present

## 2017-03-26 DIAGNOSIS — J449 Chronic obstructive pulmonary disease, unspecified: Secondary | ICD-10-CM | POA: Diagnosis not present

## 2017-03-26 DIAGNOSIS — J439 Emphysema, unspecified: Secondary | ICD-10-CM | POA: Diagnosis not present

## 2017-03-26 DIAGNOSIS — J9612 Chronic respiratory failure with hypercapnia: Secondary | ICD-10-CM | POA: Diagnosis not present

## 2017-03-26 DIAGNOSIS — D509 Iron deficiency anemia, unspecified: Secondary | ICD-10-CM | POA: Diagnosis not present

## 2017-03-26 DIAGNOSIS — R1312 Dysphagia, oropharyngeal phase: Secondary | ICD-10-CM | POA: Diagnosis not present

## 2017-03-26 DIAGNOSIS — I2609 Other pulmonary embolism with acute cor pulmonale: Secondary | ICD-10-CM | POA: Diagnosis not present

## 2017-03-26 DIAGNOSIS — J180 Bronchopneumonia, unspecified organism: Secondary | ICD-10-CM | POA: Diagnosis not present

## 2017-03-26 DIAGNOSIS — I700 Atherosclerosis of aorta: Secondary | ICD-10-CM | POA: Diagnosis not present

## 2017-03-26 DIAGNOSIS — E43 Unspecified severe protein-calorie malnutrition: Secondary | ICD-10-CM | POA: Diagnosis not present

## 2017-03-26 DIAGNOSIS — M5137 Other intervertebral disc degeneration, lumbosacral region: Secondary | ICD-10-CM | POA: Diagnosis not present

## 2017-03-26 DIAGNOSIS — I2510 Atherosclerotic heart disease of native coronary artery without angina pectoris: Secondary | ICD-10-CM | POA: Diagnosis not present

## 2017-03-26 DIAGNOSIS — I10 Essential (primary) hypertension: Secondary | ICD-10-CM | POA: Diagnosis not present

## 2017-03-26 DIAGNOSIS — J9611 Chronic respiratory failure with hypoxia: Secondary | ICD-10-CM | POA: Diagnosis not present

## 2017-03-27 ENCOUNTER — Telehealth: Payer: Self-pay | Admitting: Pulmonary Disease

## 2017-03-27 NOTE — Telephone Encounter (Signed)
lmomtcb x1 

## 2017-03-30 ENCOUNTER — Telehealth: Payer: Self-pay | Admitting: *Deleted

## 2017-03-30 NOTE — Telephone Encounter (Signed)
Received Physician Orders from [facility], forwarded to provider/SLS 07/02

## 2017-03-30 NOTE — Telephone Encounter (Signed)
Attempted to contact pt. Call went straight to voicemail. lmtcb x2 for pt.

## 2017-03-31 DIAGNOSIS — J9612 Chronic respiratory failure with hypercapnia: Secondary | ICD-10-CM | POA: Diagnosis not present

## 2017-03-31 DIAGNOSIS — I10 Essential (primary) hypertension: Secondary | ICD-10-CM | POA: Diagnosis not present

## 2017-03-31 DIAGNOSIS — D329 Benign neoplasm of meninges, unspecified: Secondary | ICD-10-CM | POA: Diagnosis not present

## 2017-03-31 DIAGNOSIS — I2609 Other pulmonary embolism with acute cor pulmonale: Secondary | ICD-10-CM | POA: Diagnosis not present

## 2017-03-31 DIAGNOSIS — I700 Atherosclerosis of aorta: Secondary | ICD-10-CM | POA: Diagnosis not present

## 2017-03-31 DIAGNOSIS — J9611 Chronic respiratory failure with hypoxia: Secondary | ICD-10-CM | POA: Diagnosis not present

## 2017-03-31 DIAGNOSIS — J180 Bronchopneumonia, unspecified organism: Secondary | ICD-10-CM | POA: Diagnosis not present

## 2017-03-31 DIAGNOSIS — E43 Unspecified severe protein-calorie malnutrition: Secondary | ICD-10-CM | POA: Diagnosis not present

## 2017-03-31 DIAGNOSIS — D509 Iron deficiency anemia, unspecified: Secondary | ICD-10-CM | POA: Diagnosis not present

## 2017-03-31 DIAGNOSIS — R1312 Dysphagia, oropharyngeal phase: Secondary | ICD-10-CM | POA: Diagnosis not present

## 2017-03-31 DIAGNOSIS — M5137 Other intervertebral disc degeneration, lumbosacral region: Secondary | ICD-10-CM | POA: Diagnosis not present

## 2017-03-31 DIAGNOSIS — J439 Emphysema, unspecified: Secondary | ICD-10-CM | POA: Diagnosis not present

## 2017-03-31 DIAGNOSIS — I2510 Atherosclerotic heart disease of native coronary artery without angina pectoris: Secondary | ICD-10-CM | POA: Diagnosis not present

## 2017-03-31 NOTE — Telephone Encounter (Signed)
Spoke with pt. States that she no longer needs to speak to [place] about her medication. Nothing further was needed.

## 2017-04-02 DIAGNOSIS — J9611 Chronic respiratory failure with hypoxia: Secondary | ICD-10-CM | POA: Diagnosis not present

## 2017-04-02 DIAGNOSIS — I700 Atherosclerosis of aorta: Secondary | ICD-10-CM | POA: Diagnosis not present

## 2017-04-02 DIAGNOSIS — E43 Unspecified severe protein-calorie malnutrition: Secondary | ICD-10-CM | POA: Diagnosis not present

## 2017-04-02 DIAGNOSIS — M5137 Other intervertebral disc degeneration, lumbosacral region: Secondary | ICD-10-CM | POA: Diagnosis not present

## 2017-04-02 DIAGNOSIS — I2510 Atherosclerotic heart disease of native coronary artery without angina pectoris: Secondary | ICD-10-CM | POA: Diagnosis not present

## 2017-04-02 DIAGNOSIS — D329 Benign neoplasm of meninges, unspecified: Secondary | ICD-10-CM | POA: Diagnosis not present

## 2017-04-02 DIAGNOSIS — J180 Bronchopneumonia, unspecified organism: Secondary | ICD-10-CM | POA: Diagnosis not present

## 2017-04-02 DIAGNOSIS — I10 Essential (primary) hypertension: Secondary | ICD-10-CM | POA: Diagnosis not present

## 2017-04-02 DIAGNOSIS — J9612 Chronic respiratory failure with hypercapnia: Secondary | ICD-10-CM | POA: Diagnosis not present

## 2017-04-02 DIAGNOSIS — D509 Iron deficiency anemia, unspecified: Secondary | ICD-10-CM | POA: Diagnosis not present

## 2017-04-02 DIAGNOSIS — J439 Emphysema, unspecified: Secondary | ICD-10-CM | POA: Diagnosis not present

## 2017-04-02 DIAGNOSIS — R1312 Dysphagia, oropharyngeal phase: Secondary | ICD-10-CM | POA: Diagnosis not present

## 2017-04-02 DIAGNOSIS — I2609 Other pulmonary embolism with acute cor pulmonale: Secondary | ICD-10-CM | POA: Diagnosis not present

## 2017-04-07 DIAGNOSIS — J9611 Chronic respiratory failure with hypoxia: Secondary | ICD-10-CM | POA: Diagnosis not present

## 2017-04-07 DIAGNOSIS — I2510 Atherosclerotic heart disease of native coronary artery without angina pectoris: Secondary | ICD-10-CM | POA: Diagnosis not present

## 2017-04-07 DIAGNOSIS — I2609 Other pulmonary embolism with acute cor pulmonale: Secondary | ICD-10-CM | POA: Diagnosis not present

## 2017-04-07 DIAGNOSIS — J439 Emphysema, unspecified: Secondary | ICD-10-CM | POA: Diagnosis not present

## 2017-04-07 DIAGNOSIS — J9612 Chronic respiratory failure with hypercapnia: Secondary | ICD-10-CM | POA: Diagnosis not present

## 2017-04-07 DIAGNOSIS — D329 Benign neoplasm of meninges, unspecified: Secondary | ICD-10-CM | POA: Diagnosis not present

## 2017-04-07 DIAGNOSIS — J180 Bronchopneumonia, unspecified organism: Secondary | ICD-10-CM | POA: Diagnosis not present

## 2017-04-07 DIAGNOSIS — M5137 Other intervertebral disc degeneration, lumbosacral region: Secondary | ICD-10-CM | POA: Diagnosis not present

## 2017-04-07 DIAGNOSIS — I700 Atherosclerosis of aorta: Secondary | ICD-10-CM | POA: Diagnosis not present

## 2017-04-07 DIAGNOSIS — D509 Iron deficiency anemia, unspecified: Secondary | ICD-10-CM | POA: Diagnosis not present

## 2017-04-07 DIAGNOSIS — E43 Unspecified severe protein-calorie malnutrition: Secondary | ICD-10-CM | POA: Diagnosis not present

## 2017-04-07 DIAGNOSIS — R1312 Dysphagia, oropharyngeal phase: Secondary | ICD-10-CM | POA: Diagnosis not present

## 2017-04-07 DIAGNOSIS — I10 Essential (primary) hypertension: Secondary | ICD-10-CM | POA: Diagnosis not present

## 2017-04-08 DIAGNOSIS — N201 Calculus of ureter: Secondary | ICD-10-CM | POA: Diagnosis not present

## 2017-04-08 DIAGNOSIS — R319 Hematuria, unspecified: Secondary | ICD-10-CM | POA: Diagnosis not present

## 2017-04-08 DIAGNOSIS — N200 Calculus of kidney: Secondary | ICD-10-CM | POA: Diagnosis not present

## 2017-04-09 ENCOUNTER — Telehealth: Payer: Self-pay | Admitting: *Deleted

## 2017-04-09 NOTE — Telephone Encounter (Signed)
Received Plan of Care from [facility]; forwarded to provider/SLS 07/12

## 2017-04-13 ENCOUNTER — Telehealth: Payer: Self-pay | Admitting: *Deleted

## 2017-04-13 NOTE — Telephone Encounter (Signed)
Patient is wanting to know if she can take her prescribed Tylenol #3 with her Eliquis. She has been experiencing headaches and wants to take her medication to treat.  Reviewed with Dr Marin Olp and this is okay.  Patient aware that she can take her Tylenol #3 as prescribed while on Eliquis.

## 2017-04-14 DIAGNOSIS — Z886 Allergy status to analgesic agent status: Secondary | ICD-10-CM | POA: Diagnosis not present

## 2017-04-14 DIAGNOSIS — B9789 Other viral agents as the cause of diseases classified elsewhere: Secondary | ICD-10-CM | POA: Diagnosis not present

## 2017-04-14 DIAGNOSIS — J449 Chronic obstructive pulmonary disease, unspecified: Secondary | ICD-10-CM | POA: Diagnosis not present

## 2017-04-14 DIAGNOSIS — R0902 Hypoxemia: Secondary | ICD-10-CM | POA: Diagnosis not present

## 2017-04-14 DIAGNOSIS — Z885 Allergy status to narcotic agent status: Secondary | ICD-10-CM | POA: Diagnosis not present

## 2017-04-14 DIAGNOSIS — Z87891 Personal history of nicotine dependence: Secondary | ICD-10-CM | POA: Diagnosis not present

## 2017-04-14 DIAGNOSIS — I2510 Atherosclerotic heart disease of native coronary artery without angina pectoris: Secondary | ICD-10-CM | POA: Diagnosis not present

## 2017-04-14 DIAGNOSIS — N3001 Acute cystitis with hematuria: Secondary | ICD-10-CM | POA: Diagnosis not present

## 2017-04-14 DIAGNOSIS — Z825 Family history of asthma and other chronic lower respiratory diseases: Secondary | ICD-10-CM | POA: Diagnosis not present

## 2017-04-14 DIAGNOSIS — R51 Headache: Secondary | ICD-10-CM | POA: Diagnosis not present

## 2017-04-14 DIAGNOSIS — R0602 Shortness of breath: Secondary | ICD-10-CM | POA: Diagnosis not present

## 2017-04-14 DIAGNOSIS — I10 Essential (primary) hypertension: Secondary | ICD-10-CM | POA: Diagnosis not present

## 2017-04-15 DIAGNOSIS — D509 Iron deficiency anemia, unspecified: Secondary | ICD-10-CM | POA: Diagnosis not present

## 2017-04-15 DIAGNOSIS — I2510 Atherosclerotic heart disease of native coronary artery without angina pectoris: Secondary | ICD-10-CM | POA: Diagnosis not present

## 2017-04-15 DIAGNOSIS — D329 Benign neoplasm of meninges, unspecified: Secondary | ICD-10-CM | POA: Diagnosis not present

## 2017-04-15 DIAGNOSIS — J9612 Chronic respiratory failure with hypercapnia: Secondary | ICD-10-CM | POA: Diagnosis not present

## 2017-04-15 DIAGNOSIS — I700 Atherosclerosis of aorta: Secondary | ICD-10-CM | POA: Diagnosis not present

## 2017-04-15 DIAGNOSIS — R1312 Dysphagia, oropharyngeal phase: Secondary | ICD-10-CM | POA: Diagnosis not present

## 2017-04-15 DIAGNOSIS — M5137 Other intervertebral disc degeneration, lumbosacral region: Secondary | ICD-10-CM | POA: Diagnosis not present

## 2017-04-15 DIAGNOSIS — I10 Essential (primary) hypertension: Secondary | ICD-10-CM | POA: Diagnosis not present

## 2017-04-15 DIAGNOSIS — J439 Emphysema, unspecified: Secondary | ICD-10-CM | POA: Diagnosis not present

## 2017-04-15 DIAGNOSIS — I2609 Other pulmonary embolism with acute cor pulmonale: Secondary | ICD-10-CM | POA: Diagnosis not present

## 2017-04-15 DIAGNOSIS — J180 Bronchopneumonia, unspecified organism: Secondary | ICD-10-CM | POA: Diagnosis not present

## 2017-04-15 DIAGNOSIS — E43 Unspecified severe protein-calorie malnutrition: Secondary | ICD-10-CM | POA: Diagnosis not present

## 2017-04-15 DIAGNOSIS — J9611 Chronic respiratory failure with hypoxia: Secondary | ICD-10-CM | POA: Diagnosis not present

## 2017-04-16 ENCOUNTER — Ambulatory Visit: Payer: Medicare Other | Admitting: Medical

## 2017-04-17 ENCOUNTER — Ambulatory Visit (HOSPITAL_BASED_OUTPATIENT_CLINIC_OR_DEPARTMENT_OTHER): Payer: Medicare Other | Admitting: Family

## 2017-04-17 ENCOUNTER — Telehealth: Payer: Self-pay | Admitting: *Deleted

## 2017-04-17 ENCOUNTER — Other Ambulatory Visit (HOSPITAL_BASED_OUTPATIENT_CLINIC_OR_DEPARTMENT_OTHER): Payer: Medicare Other

## 2017-04-17 VITALS — BP 132/62 | HR 77 | Temp 98.6°F | Resp 20 | Wt 82.0 lb

## 2017-04-17 DIAGNOSIS — J449 Chronic obstructive pulmonary disease, unspecified: Secondary | ICD-10-CM | POA: Diagnosis not present

## 2017-04-17 DIAGNOSIS — D509 Iron deficiency anemia, unspecified: Secondary | ICD-10-CM | POA: Diagnosis not present

## 2017-04-17 DIAGNOSIS — I2609 Other pulmonary embolism with acute cor pulmonale: Secondary | ICD-10-CM

## 2017-04-17 DIAGNOSIS — D500 Iron deficiency anemia secondary to blood loss (chronic): Secondary | ICD-10-CM

## 2017-04-17 DIAGNOSIS — Z86718 Personal history of other venous thrombosis and embolism: Secondary | ICD-10-CM

## 2017-04-17 DIAGNOSIS — I2782 Chronic pulmonary embolism: Principal | ICD-10-CM

## 2017-04-17 LAB — COMPREHENSIVE METABOLIC PANEL
ALBUMIN: 4.2 g/dL (ref 3.5–5.0)
ALK PHOS: 71 U/L (ref 40–150)
ALT: 22 U/L (ref 0–55)
ANION GAP: 13 meq/L — AB (ref 3–11)
AST: 57 U/L — ABNORMAL HIGH (ref 5–34)
BILIRUBIN TOTAL: 0.45 mg/dL (ref 0.20–1.20)
BUN: 12.9 mg/dL (ref 7.0–26.0)
CO2: 36 mEq/L — ABNORMAL HIGH (ref 22–29)
Calcium: 10.7 mg/dL — ABNORMAL HIGH (ref 8.4–10.4)
Chloride: 95 mEq/L — ABNORMAL LOW (ref 98–109)
Creatinine: 0.6 mg/dL (ref 0.6–1.1)
EGFR: >90 mL/min/{1.73_m2} (ref >90)
Glucose: 104 mg/dl (ref 70–140)
POTASSIUM: 3.7 meq/L (ref 3.5–5.1)
SODIUM: 145 meq/L (ref 136–145)
TOTAL PROTEIN: 7.5 g/dL (ref 6.4–8.3)

## 2017-04-17 LAB — CBC WITH DIFFERENTIAL (CANCER CENTER ONLY)
BASO#: 0 10*3/uL (ref 0.0–0.2)
BASO%: 0.2 % (ref 0.0–2.0)
EOS%: 0.7 % (ref 0.0–7.0)
Eosinophils Absolute: 0 10*3/uL (ref 0.0–0.5)
HCT: 39.9 % (ref 34.8–46.6)
HEMOGLOBIN: 11.6 g/dL (ref 11.6–15.9)
LYMPH#: 1.4 10*3/uL (ref 0.9–3.3)
LYMPH%: 23.3 % (ref 14.0–48.0)
MCH: 22.4 pg — ABNORMAL LOW (ref 26.0–34.0)
MCHC: 29.1 g/dL — AB (ref 32.0–36.0)
MCV: 77 fL — AB (ref 81–101)
MONO#: 0.4 10*3/uL (ref 0.1–0.9)
MONO%: 6.9 % (ref 0.0–13.0)
NEUT#: 4.2 10*3/uL (ref 1.5–6.5)
NEUT%: 68.9 % (ref 39.6–80.0)
Platelets: 296 10*3/uL (ref 145–400)
RBC: 5.19 10*6/uL (ref 3.70–5.32)
RDW: 15.4 % (ref 11.1–15.7)
WBC: 6.1 10*3/uL (ref 3.9–10.0)

## 2017-04-17 LAB — IRON AND TIBC
%SAT: 34 % (ref 21–57)
Iron: 120 ug/dL (ref 41–142)
TIBC: 356 ug/dL (ref 236–444)
UIBC: 237 ug/dL (ref 120–384)

## 2017-04-17 LAB — FERRITIN: Ferritin: 95 ng/ml (ref 9–269)

## 2017-04-17 MED ORDER — APIXABAN 2.5 MG PO TABS: 2.5000 mg | ORAL_TABLET | Freq: Two times a day (BID) | ORAL | 3 refills | Status: DC

## 2017-04-17 NOTE — Telephone Encounter (Signed)
Received Plan of Care from [facility]; forwarded to provider/SLS 07/20

## 2017-04-17 NOTE — Progress Notes (Signed)
Hematology and Oncology Follow Up Visit  Holly Page [id number] Mar 27, 1936 81 y.o. 04/17/2017   Principle Diagnosis:  Thromboembolic disease of the right thigh Superficial thrombus of the left arm  Pulmonary Embolism - dx in 11/2016  Current Therapy:   Eliquis 5 mg po BID - reduced to 2.5 mg PO BID today (04/17/2017)   Interim History:  Holly Page is here today with her grandson for follow-up. She is quite anxious about her health and was recently in the ED at [facility] with exacerbation of COPD. She was given a breathing treatment and steroids and her symptoms improved enough that she was able to go home.  She is concerned that there was blood in her urine. Her stool has also been dark but she states that she has taken an iron supplement off and on. Her Hgb today is improved and stable at 11.6 with an MCV of 77. Platelet count is 296.  No fever, chills, n/v, cough, rash, chest pain, palpitations, abdominal pain or changes in bowel or bladder habits.  No swelling, numbness or tingling in her extremities. She has chronic lower back issues and aching pain in her legs. This is a longstanding issue for her and she will follow-up with her PCP regarding this issue.   She is sedentary at home due to the COPD with SOB and fatigue at times.  She is on 2L supplemental O2 24 hours a day. Her breathing is labored at times.  Her CT angio in June showed resolution of PE. We discussed the fart that her severe COPD and sedentary lifestyle put her at a significantly increased risk of another DVT/PE recurrence. She verbalized understanding.  She states that she is hungry but does not feel like eating. She admits that she needs to drink more fluids. It is hard for her due to urinary frequency. She did have a UTI when in the ED last week.  Her weight is down another 6 lbs since her last visit.   ECOG Performance Status: 2 - Symptomatic, <50% confined to bed  Medications:  Allergies as of 04/17/2017    Reactions   Aspirin Other (See Comments)   Other reaction(s): PALPITATIONS Other reaction(s): DIFFICULTY BREATHING Heart flutter   Incruse Ellipta [umeclidinium Bromide] Shortness Of Breath   Tramadol Shortness Of Breath   Amlodipine Other (See Comments)   Other reaction(s): SWELLING   Codeine Other (See Comments)   Hallucinations, can take Tylenol #3 now   Doxycycline Other (See Comments)   Other reaction(s): NAUSEA,VOMITING   Gabapentin Other (See Comments)   Other reaction(s): Other (See Comments) She could not swallow the large pill   Hydrocodone Other (See Comments)   hallucinations   Losartan Other (See Comments)   unknown   Propoxyphene Nausea Only   Tiotropium Itching, Rash      Medication List       Accurate as of 04/17/17 10:01 AM. Always use your most recent med list.          acetaminophen-codeine 300-30 MG tablet Commonly known as:  TYLENOL #3 Take 1 tablet by mouth every 6 (six) hours as needed for moderate pain.   Acidophilus Lactobacillus Caps Take 1 capsule by mouth daily with breakfast.   albuterol 108 (90 Base) MCG/ACT inhaler Commonly known as:  PROVENTIL HFA;VENTOLIN HFA Inhale 2 puffs into the lungs every 4 (four) hours as needed for wheezing or shortness of breath.   albuterol (2.5 MG/3ML) 0.083% nebulizer solution Commonly known as:  PROVENTIL Take 3 mLs (  2.5 mg total) by nebulization every 6 (six) hours as needed for wheezing or shortness of breath.   apixaban 2.5 MG Tabs tablet Commonly known as:  ELIQUIS Take 1 tablet (2.5 mg total) by mouth 2 (two) times daily.   cefdinir 300 MG capsule Commonly known as:  OMNICEF Take 300 mg by mouth 2 (two) times daily. Take one capsule two times a day for 10 days.   cholecalciferol 1000 units tablet Commonly known as:  VITAMIN D Take 1,000 Units by mouth daily.   ENSURE ENLIVE PO Take 237 mLs by mouth 2 (two) times daily.   fluticasone furoate-vilanterol 200-25 MCG/INH Aepb Commonly known  as:  BREO ELLIPTA Inhale 1 puff into the lungs daily.   fluticasone furoate-vilanterol 200-25 MCG/INH Aepb Commonly known as:  BREO ELLIPTA Inhale 1 puff into the lungs daily.   Lactobacillus Acidophilus Powd Take by mouth.   MULTIVITAMIN & MINERAL PO Take 1 tablet by mouth daily.   OXYGEN Inhale 2 L into the lungs daily as needed. To maintain o2 saturation of 90%   predniSONE 20 MG tablet Commonly known as:  DELTASONE Take 60 mg by mouth daily. Take three 20 mg tablets for 5 days.   ranitidine 300 MG capsule Commonly known as:  ZANTAC Take 1 capsule (300 mg total) by mouth every evening.       Allergies:  Allergies  Allergen Reactions  . Aspirin Other (See Comments)    Other reaction(s): PALPITATIONS Other reaction(s): DIFFICULTY BREATHING Heart flutter  . Incruse Ellipta [Umeclidinium Bromide] Shortness Of Breath  . Tramadol Shortness Of Breath  . Amlodipine Other (See Comments)    Other reaction(s): SWELLING  . Codeine Other (See Comments)    Hallucinations, can take Tylenol #3 now  . Doxycycline Other (See Comments)    Other reaction(s): NAUSEA,VOMITING  . Gabapentin Other (See Comments)    Other reaction(s): Other (See Comments) She could not swallow the large pill  . Hydrocodone Other (See Comments)    hallucinations  . Losartan Other (See Comments)    unknown  . Propoxyphene Nausea Only  . Tiotropium Itching and Rash    Past Medical History, Surgical history, Social history, and Family History were reviewed and updated.  Review of Systems: All other 10 point review of systems is negative.   Physical Exam:  weight is 82 lb (37.2 kg). Her oral temperature is 98.6 F (37 C). Her blood pressure is 132/62 and her pulse is 77. Her respiration is 20 and oxygen saturation is 100%.   Wt Readings from Last 3 Encounters:  04/17/17 82 lb (37.2 kg)  03/25/17 88 lb 3.2 oz (40 kg)  03/12/17 89 lb (40.4 kg)    Ocular: Sclerae unicteric, pupils equal, round  and reactive to light Ear-nose-throat: Oropharynx clear, dentition fair Lymphatic: No cervical, supraclavicular or axillary adenopathy Lungs no rales or rhonchi, good excursion bilaterally Heart regular rate and rhythm, no murmur appreciated Abd soft, nontender, positive bowel sounds, no liver or spleen tip palpated on exam, no fluid wave MSK no focal spinal tenderness, no joint edema Neuro: non-focal, well-oriented, appropriate affect Breasts: Deferred   Lab Results  Component Value Date   WBC 6.1 04/17/2017   HGB 11.6 04/17/2017   HCT 39.9 04/17/2017   MCV 77 (L) 04/17/2017   PLT 296 04/17/2017   Lab Results  Component Value Date   FERRITIN 144 03/06/2017   IRON 72 03/06/2017   TIBC 378 03/06/2017   UIBC 305 03/06/2017   IRONPCTSAT 19 (  L) 03/06/2017   Lab Results  Component Value Date   RBC 5.19 04/17/2017   No results found for: KPAFRELGTCHN, LAMBDASER, KAPLAMBRATIO No results found for: IGGSERUM, IGA, IGMSERUM No results found for: Kathrynn Ducking, MSPIKE, SPEI   Chemistry      Component Value Date/Time   NA 144 03/16/2017 1218   NA 145 03/06/2017 1311   NA 148 (H) 12/30/2016 0803   K 3.6 03/16/2017 1218   K 3.7 03/06/2017 1311   K 4.1 12/30/2016 0803   CL 100 03/16/2017 1218   CL 102 03/06/2017 1311   CO2 39 (H) 03/16/2017 1218   CO2 36 (H) 03/06/2017 1311   CO2 41 (H) 12/30/2016 0803   BUN 14 03/16/2017 1218   BUN 10 03/06/2017 1311   BUN 8.2 12/30/2016 0803   CREATININE 0.50 03/16/2017 1218   CREATININE 0.5 (L) 03/06/2017 1311   CREATININE 0.6 12/30/2016 0803   GLU 105 12/25/2016      Component Value Date/Time   CALCIUM 9.7 03/16/2017 1218   CALCIUM 9.7 03/06/2017 1311   CALCIUM 10.0 12/30/2016 0803   ALKPHOS 75 03/06/2017 1311   ALKPHOS 67 12/30/2016 0803   AST 49 (H) 03/06/2017 1311   AST 44 (H) 12/30/2016 0803   ALT 24 03/06/2017 1311   ALT 24 12/30/2016 0803   BILITOT 0.70 03/06/2017 1311   BILITOT  0.39 12/30/2016 0803      Impression and Plan: Holly Page is a 81 yo African American female with multiple health problems. Her COPD seems to be her biggest issue at this time. She was recently in the ED due to exacerbation. She is on supplemental O2 24 hours a day.  She has a lot of anxiety about her health. She had a UTI last week as well with hematuria. Thankfully, her Hgb and iron studies have remained stable.  CT angio in June showed resolution of her PE.  I spoke with Dr. Marin Olp and since she is at high risk for developing another thrombus we will keep her on Eliquis but reduce her dose to 2.5 mg PO BID. We discussed how she will take this and to stop the 5 mg tablets today starting the 2.5 mg Eliquis this evening with dinner. She verbalized understanding and will contact our office with any questions or concerns. We can certainly see her sooner if need be.  We will plan to see her back again in 4 weeks for repeat labs and provider visit.  Greater than 50% of her 15 minute face to face visit was spent counseling and coordinating care.   Eliezer Bottom, NP 7/20/201810:01 AM

## 2017-04-21 DIAGNOSIS — D329 Benign neoplasm of meninges, unspecified: Secondary | ICD-10-CM | POA: Diagnosis not present

## 2017-04-21 DIAGNOSIS — J439 Emphysema, unspecified: Secondary | ICD-10-CM | POA: Diagnosis not present

## 2017-04-21 DIAGNOSIS — I2510 Atherosclerotic heart disease of native coronary artery without angina pectoris: Secondary | ICD-10-CM | POA: Diagnosis not present

## 2017-04-21 DIAGNOSIS — J9612 Chronic respiratory failure with hypercapnia: Secondary | ICD-10-CM | POA: Diagnosis not present

## 2017-04-21 DIAGNOSIS — R1312 Dysphagia, oropharyngeal phase: Secondary | ICD-10-CM | POA: Diagnosis not present

## 2017-04-21 DIAGNOSIS — I700 Atherosclerosis of aorta: Secondary | ICD-10-CM | POA: Diagnosis not present

## 2017-04-21 DIAGNOSIS — I10 Essential (primary) hypertension: Secondary | ICD-10-CM | POA: Diagnosis not present

## 2017-04-21 DIAGNOSIS — M5137 Other intervertebral disc degeneration, lumbosacral region: Secondary | ICD-10-CM | POA: Diagnosis not present

## 2017-04-21 DIAGNOSIS — J9611 Chronic respiratory failure with hypoxia: Secondary | ICD-10-CM | POA: Diagnosis not present

## 2017-04-21 DIAGNOSIS — D509 Iron deficiency anemia, unspecified: Secondary | ICD-10-CM | POA: Diagnosis not present

## 2017-04-21 DIAGNOSIS — J180 Bronchopneumonia, unspecified organism: Secondary | ICD-10-CM | POA: Diagnosis not present

## 2017-04-21 DIAGNOSIS — I2609 Other pulmonary embolism with acute cor pulmonale: Secondary | ICD-10-CM | POA: Diagnosis not present

## 2017-04-21 DIAGNOSIS — E43 Unspecified severe protein-calorie malnutrition: Secondary | ICD-10-CM | POA: Diagnosis not present

## 2017-04-24 ENCOUNTER — Telehealth: Payer: Self-pay | Admitting: *Deleted

## 2017-04-24 NOTE — Telephone Encounter (Signed)
Plan of Care signed by Dr. Larose Kells in PCP absence, form faxed to [facility] at [phone]. Form sent for scanning.

## 2017-04-24 NOTE — Telephone Encounter (Signed)
Received [place]; forwarded to covering provider/SLS [phone]

## 2017-04-25 DIAGNOSIS — J449 Chronic obstructive pulmonary disease, unspecified: Secondary | ICD-10-CM | POA: Diagnosis not present

## 2017-04-29 DIAGNOSIS — I2609 Other pulmonary embolism with acute cor pulmonale: Secondary | ICD-10-CM | POA: Diagnosis not present

## 2017-04-29 DIAGNOSIS — I2510 Atherosclerotic heart disease of native coronary artery without angina pectoris: Secondary | ICD-10-CM | POA: Diagnosis not present

## 2017-04-29 DIAGNOSIS — M5137 Other intervertebral disc degeneration, lumbosacral region: Secondary | ICD-10-CM | POA: Diagnosis not present

## 2017-04-29 DIAGNOSIS — E43 Unspecified severe protein-calorie malnutrition: Secondary | ICD-10-CM | POA: Diagnosis not present

## 2017-04-29 DIAGNOSIS — I700 Atherosclerosis of aorta: Secondary | ICD-10-CM | POA: Diagnosis not present

## 2017-04-29 DIAGNOSIS — D509 Iron deficiency anemia, unspecified: Secondary | ICD-10-CM | POA: Diagnosis not present

## 2017-04-29 DIAGNOSIS — J9612 Chronic respiratory failure with hypercapnia: Secondary | ICD-10-CM | POA: Diagnosis not present

## 2017-04-29 DIAGNOSIS — D329 Benign neoplasm of meninges, unspecified: Secondary | ICD-10-CM | POA: Diagnosis not present

## 2017-04-29 DIAGNOSIS — I10 Essential (primary) hypertension: Secondary | ICD-10-CM | POA: Diagnosis not present

## 2017-04-29 DIAGNOSIS — J9611 Chronic respiratory failure with hypoxia: Secondary | ICD-10-CM | POA: Diagnosis not present

## 2017-04-29 DIAGNOSIS — R1312 Dysphagia, oropharyngeal phase: Secondary | ICD-10-CM | POA: Diagnosis not present

## 2017-04-29 DIAGNOSIS — J439 Emphysema, unspecified: Secondary | ICD-10-CM | POA: Diagnosis not present

## 2017-04-29 DIAGNOSIS — J180 Bronchopneumonia, unspecified organism: Secondary | ICD-10-CM | POA: Diagnosis not present

## 2017-04-30 ENCOUNTER — Encounter: Payer: Self-pay | Admitting: Adult Health

## 2017-04-30 ENCOUNTER — Ambulatory Visit (INDEPENDENT_AMBULATORY_CARE_PROVIDER_SITE_OTHER): Payer: Medicare Other | Admitting: Adult Health

## 2017-04-30 DIAGNOSIS — I2782 Chronic pulmonary embolism: Secondary | ICD-10-CM

## 2017-04-30 DIAGNOSIS — J439 Emphysema, unspecified: Secondary | ICD-10-CM

## 2017-04-30 DIAGNOSIS — J9611 Chronic respiratory failure with hypoxia: Secondary | ICD-10-CM

## 2017-04-30 NOTE — Progress Notes (Signed)
@Patient  ID: Holly Page, female    DOB: January 22, 1936, 81 y.o.   MRN: [id number]  Chief Complaint  Patient presents with  . Follow-up    COPD     Referring provider: Copland, Gay Filler, MD  HPI: 80y.o. F with Gold D Copd primary emphysema on O2 >GOLD card patient  Hx of DVT (2015) , superficial Vein thrombus 2016 , on xarelto , followed by Hematology  PE 11/2016 (with Influenza and PNA) -life long anticoagulation  TEST /Events  Hx of DVT -05/2014 (tx w/ xarelto until 12/2014 ) -followed by hematology  Left arm superficial venous thrombus , Xarelto 01/25/15 >followed by Hematology >stopped 07/2015  CT chest, on March 19 2015 >that showed no evidence of pulmonary embolism and stable. Severe COPD changes. Last spirometry showed FEV1 at 30% in 2015.  Intolerant to [place] ,[place] [employer] and tudorza , Psychologist, forensic  Oxygen does drop with pulsing O2 on 2 L was able to keep above 90% on continuous flow  Cardiology eval HP per pt with a negative stress test recently.  [facility]ulmonary rehab 2015 at [facility]    04/30/2017 Follow up : COPD , O2 RF , PE /DVT Pt returns for 3 month follow up . She is followed for severe COPD , O2 dependent. She is on BREO daily . She has tried many inhalers and nebs in past with multiple intolerances and minimal perceived benefit. She remains very sob with minimal activity . Mainly sedentary , lives alone. Family helps with grocery and transportation.  Says breathing is not doing as well. Went to ER 2 weeks ago with COPD flare . Given prednisone 60mg  daily for 3 days . CXR w/ no acute process, severe COPD changes.  Cough is minimally productive and is clear. She admits she does not take BREO everyday . We discussed med compliance . She denies chest pain, orthopnea , edema or fever.   She was admitted 11/2016 with PNA/Influenza found to have a PE. She is on Eliquis .  PVX and Prevnar are utd.   She is on 2l/m O2 . Feels it helps her.    Allergies  Allergen  Reactions  . Aspirin Other (See Comments)    Other reaction(s): PALPITATIONS Other reaction(s): DIFFICULTY BREATHING Heart flutter  . Incruse Ellipta [Umeclidinium Bromide] Shortness Of Breath  . Tramadol Shortness Of Breath  . Amlodipine Other (See Comments)    Other reaction(s): SWELLING  . Codeine Other (See Comments)    Hallucinations, can take Tylenol #3 now  . Doxycycline Other (See Comments)    Other reaction(s): NAUSEA,VOMITING  . Gabapentin Other (See Comments)    Other reaction(s): Other (See Comments) She could not swallow the large pill  . Hydrocodone Other (See Comments)    hallucinations  . Losartan Other (See Comments)    unknown  . Propoxyphene Nausea Only  . Tiotropium Itching and Rash    Immunization History  Administered Date(s) Administered  . Pneumococcal Conjugate-13 08/09/2015  . Pneumococcal Polysaccharide-23 09/29/2001, 09/29/2008, 04/26/2012  . Td 03/28/2009  . Tdap 04/04/2008    Past Medical History:  Diagnosis Date  . Anemia   . Arthritis   . CAD (coronary artery disease)   . Clotting disorder ([place])   . COPD (chronic obstructive pulmonary disease) ([place])   . DVT (deep venous thrombosis) ([place]) 1980s, recurrent 2015   Right DVT 2015  on xarelto  . Fibroid   . GERD (gastroesophageal reflux disease)   . Hypertension   . Hypertension   .  Iron deficiency anemia due to chronic blood loss 01/23/2017  . 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 Kidney stone   . Lactose intolerance   . Meningioma ([place])    followed by Dr Christella Noa  . Pneumonia   . Spinal stenosis     Tobacco History: History  Smoking Status  . Former Smoker  . Packs/day: 1.50  . Years: 56.00  . Types: Cigarettes  . Quit date: 04/29/2012  Smokeless Tobacco  . Never Used    Comment: quit smoking 3 years ago   Counseling given: Not Answered   Outpatient Encounter Prescriptions as of 04/30/2017  Medication Sig  . acetaminophen-codeine (TYLENOL #3) 300-30 MG tablet Take 1 tablet by  mouth every 6 (six) hours as needed for moderate pain.  . Acidophilus Lactobacillus CAPS Take 1 capsule by mouth daily with breakfast.  . albuterol (PROVENTIL HFA;VENTOLIN HFA) 108 (90 Base) MCG/ACT inhaler Inhale 2 puffs into the lungs every 4 (four) hours as needed for wheezing or shortness of breath.  [name] albuterol (PROVENTIL) (2.5 MG/3ML) 0.083% nebulizer solution Take 3 mLs (2.5 mg total) by nebulization every 6 (six) hours as needed for wheezing or shortness of breath.  [name] apixaban (ELIQUIS) 2.5 MG TABS tablet Take 1 tablet (2.5 mg total) by mouth 2 (two) times daily.  . cholecalciferol (VITAMIN D) 1000 units tablet Take 1,000 Units by mouth daily.  . fluticasone furoate-vilanterol (BREO ELLIPTA) 200-25 MCG/INH AEPB Inhale 1 puff into the lungs daily.  . Lactobacillus Acidophilus POWD Take by mouth.  . Multiple Vitamins-Minerals (MULTIVITAMIN & MINERAL PO) Take 1 tablet by mouth daily.  . Nutritional Supplements (ENSURE ENLIVE PO) Take 237 mLs by mouth 2 (two) times daily.  . OXYGEN Inhale 2 L into the lungs daily as needed. To maintain o2 saturation of 90%  . ranitidine (ZANTAC) 300 MG capsule Take 1 capsule (300 mg total) by mouth every evening.  . solifenacin (VESICARE) 5 MG tablet Take 5 mg by mouth.  . [DISCONTINUED] cefdinir (OMNICEF) 300 MG capsule Take 300 mg by mouth 2 (two) times daily. Take one capsule two times a day for 10 days.  . [DISCONTINUED] fluticasone furoate-vilanterol (BREO ELLIPTA) 200-25 MCG/INH AEPB Inhale 1 puff into the lungs daily.   No facility-administered encounter medications on file as of 04/30/2017.      Review of Systems  Constitutional:   No  weight loss, night sweats,  Fevers, chills, + fatigue, or  lassitude.  HEENT:   No headaches,  Difficulty swallowing,  Tooth/dental problems, or  Sore throat,                No sneezing, itching, ear ache,  +nasal congestion, post nasal drip,   CV:  No chest pain,  Orthopnea, PND, swelling in lower extremities,  anasarca, dizziness, palpitations, syncope.   GI  No heartburn, indigestion, abdominal pain, nausea, vomiting, diarrhea, change in bowel habits, loss of appetite, bloody stools.   Resp:  No chest wall deformity  Skin: no rash or lesions.  GU: no dysuria, change in color of urine, no urgency or frequency.  No flank pain, no hematuria   MS:  No joint pain or swelling.  No decreased range of motion.  No back pain.    Physical Exam  BP 122/67 (BP Location: Left Arm, Patient Position: Sitting, Cuff Size: Normal)   Pulse 74   Ht 5' 5.5" (1.664 m)   Wt 88 lb (39.9 kg)   SpO2 93%   BMI 14.42 kg/m   GEN: A/Ox3;  pleasant , NAD, thin frail , in wc , on O2    HEENT:  [place]/AT,  EACs-clear, TMs-wnl, NOSE-clear, THROAT-clear, no lesions, no postnasal drip or exudate noted.   NECK:  Supple w/ fair ROM; no JVD; normal carotid impulses w/o bruits; no thyromegaly or nodules palpated; no lymphadenopathy.    RESP  Diminished BS in bases , no accessory muscle use, no dullness to percussion  CARD:  RRR, no m/r/g, no peripheral edema, pulses intact, no cyanosis or clubbing.  GI:   Soft & nt; nml bowel sounds; no organomegaly or masses detected.   Musco: Warm bil, no deformities or joint swelling noted.   Neuro: alert, no focal deficits noted.    Skin: Warm, no lesions or rashes    Lab Results:  CBC  ProBNP Imaging: No results found.   Assessment & Plan:   Pulmonary embolus (HCC) Cont on Eliquis and follow up with Hematology   COPD with emphysema Gold D Prone to frequent flares Encouraged on med compliance  Samples given today .   Plan  Patient Instructions  Continue on BREO 1 puff daily , rinse after use. (Try to take everyday)  Continue on Albuterol Neb As needed   Continue on Oxygen 2l/m .  Ensure Twice daily  Between meals.  Follow up in 6-8 weeks  as planned and As needed    Please contact office for sooner follow up if symptoms do not improve or worsen or seek  emergency care      Chronic respiratory failure ([place]) Cont on O2 .      Rexene Edison, NP 04/30/2017

## 2017-04-30 NOTE — Patient Instructions (Signed)
Continue on BREO 1 puff daily , rinse after use. (Try to take everyday)  Continue on Albuterol Neb As needed   Continue on Oxygen 2l/m .  Ensure Twice daily  Between meals.  Follow up in 6-8 weeks  as planned and As needed    Please contact office for sooner follow up if symptoms do not improve or worsen or seek emergency care

## 2017-04-30 NOTE — Assessment & Plan Note (Signed)
Cont on Eliquis and follow up with Hematology

## 2017-04-30 NOTE — Assessment & Plan Note (Signed)
Prone to frequent flares Encouraged on med compliance  Samples given today .   Plan  Patient Instructions  Continue on BREO 1 puff daily , rinse after use. (Try to take everyday)  Continue on Albuterol Neb As needed   Continue on Oxygen 2l/m .  Ensure Twice daily  Between meals.  Follow up in 6-8 weeks  as planned and As needed    Please contact office for sooner follow up if symptoms do not improve or worsen or seek emergency care

## 2017-04-30 NOTE — Assessment & Plan Note (Signed)
Cont on O2 .  

## 2017-05-03 NOTE — Progress Notes (Addendum)
[facility] [address] [place], [place] [address] [phone]  Date:  05/06/2017   Name:  Holly Page   DOB:  1935/10/31   MRN:  [id number]  PCP:  Darreld Mclean, MD    Chief Complaint: Follow-up (Pt here for f/u visit. )   History of Present Illness:  Holly Page is a 81 y.o. very pleasant female patient who presents with the following:  Visit with pulmonology last week as follows:  Pt returns for 3 month follow up . She is followed for severe COPD , O2 dependent. She is on BREO daily . She has tried many inhalers and nebs in past with multiple intolerances and minimal perceived benefit. She remains very sob with minimal activity . Mainly sedentary , lives alone. Family helps with grocery and transportation.  Says breathing is not doing as well. Went to ER 2 weeks ago with COPD flare . Given prednisone 60mg  daily for 3 days . CXR w/ no acute process, severe COPD changes.  Cough is minimally productive and is clear. She admits she does not take BREO everyday . We discussed med compliance . She denies chest pain, orthopnea , edema or fever.   She was admitted 11/2016 with PNA/Influenza found to have a PE. She is on Eliquis .  PVX and Prevnar are utd.  She is on 2l/m O2 . Feels it helps her.   Here today to follow--up her weakness, malaise, and severe COPD  She went to the ER on 7/17 for her breathing; was treated and released to home.  She has seen pulmonology since, most recent note is above Also states that she bumped her head on a wall on 7/4, and another time in July as well.  She did not fall or hit her head hard.  She was bending down to get something and misjudged, hitting her head She did not have any LOC She states that her headaches are actually better than they have been in the past.  She is not having any current head pain or particular neurological symptoms  Dr. Marin Olp changed her  eliquis to 2.5 BID; this has helped with her leg pain but she feels maybe more dizzy than she did when she was on 5 mg.  Wonders if this is related- I advised that it is probably not related  No nausea or vomiting No fever She is on 2L continuous O2 day and night.   Her breathing is about the same as normal for her At home she is walking around the house some, is able to do some light cooking and straightening of her home  She sometimes feels like her left nare is congested. She will notice that mucus from her right nare is thin and easy to blow out, but the left side can be harder to clear  Wonders if she can drink chocolate flavored ensure as she was asked to avoid caffeine; advised that this is ok    Pt then describes "hurting between my breasts that I had yesterday."  She is not sure if it might have been reflux.  She states "it was not a pain, it was a hurting"  She felt like perhaps it was reflux- it went away after she took a tylenol #3 Not associated with exertion This lasted "all day until I took that pill."   Denies any current CP  She did have a negative nuclear stress  about 2 years ago  She is anticoaguated on eliquis  She does notice that sometimes "my body will jump a little bit when I am sitting there watching TV."  She wants to know that is causing this.   Suggested that perhaps this is due to her starting to drift off to sleep and then waking up but she disagrees Noticed slightly high calcium at last labs  Wt Readings from Last 3 Encounters:  04/30/17 88 lb (39.9 kg)  04/17/17 82 lb (37.2 kg)  03/25/17 88 lb 3.2 oz (40 kg)    Patient Active Problem List   Diagnosis Date Noted  . Iron deficiency anemia due to chronic blood loss 01/23/2017  . 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 Pulmonary embolus ([place]) 12/30/2016  . Severe sepsis ([place]) 12/26/2016  . Influenza B 12/26/2016  . Acute urinary retention 12/26/2016  . Oropharyngeal dysphagia 12/26/2016  . Metabolic  acidosis 78/29/5621  . Polyneuropathy 12/26/2016  . Arthritis   . Clotting disorder ([place])   . CAD (coronary artery disease)   . Fibroid   . Hypertension   . Kidney stone   . Protein-calorie malnutrition, severe 07/23/2016  . Shortness of breath 07/23/2016  . SOB (shortness of breath) 07/21/2016  . Acute on chronic respiratory failure with hypoxia and hypercapnia ([place]) 07/21/2016  . Chronic respiratory failure ([place]) 04/10/2016  . COPD with exacerbation ([place]) 03/19/2016  . RBC microcytosis 03/19/2016  . COPD with acute exacerbation ([place]) 03/19/2016  . Oxygen dependent 12/31/2015  . Anxiety 11/08/2015  . Hyperthyroidism 09/07/2015  . Malnutrition of moderate degree 08/31/2015  . Abnormal LFTs 03/19/2015  . Back pain 03/19/2015  . HTN (hypertension) 03/01/2015  . Anemia 12/19/2014  . Calculus of kidney 08/30/2014  . Hematuria 08/11/2014  . History of DVT (deep vein thrombosis)   . Neuralgia neuritis, sciatic nerve 05/22/2014  . 1st degree AV block 04/24/2014  . History of colon polyps 04/24/2014  . Adaptive colitis 04/24/2014  . APC (atrial premature contractions) 04/24/2014  . Benign neoplasm of meninges ([place]) 02/10/2014  . Barrett esophagus 02/03/2014  . Spinal stenosis 12/15/2013  . Allergic rhinitis 11/21/2013  . COPD with emphysema Gold D   . GERD (gastroesophageal reflux disease)   . Lactose intolerance   . DDD (degenerative disc disease), lumbosacral 06/11/2010  . Diffuse cerebrovascular disease 06/11/2010    Past Medical History:  Diagnosis Date  . Anemia   . Arthritis   . CAD (coronary artery disease)   . Clotting disorder ([place])   . COPD (chronic obstructive pulmonary disease) ([place])   . DVT (deep venous thrombosis) ([place]) 1980s, recurrent 2015   Right DVT 2015  on xarelto  . Fibroid   . GERD (gastroesophageal reflux disease)   . Hypertension   . Hypertension   . Iron deficiency anemia due to chronic blood loss 01/23/2017  . 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  Kidney stone   . Lactose intolerance   . Meningioma ([place])    followed by Dr Christella Noa  . Pneumonia   . Spinal stenosis     Past Surgical History:  Procedure Laterality Date  . CHOLECYSTECTOMY    . FOOT SURGERY    . INCISION AND DRAINAGE FOOT    . TONSILLECTOMY AND ADENOIDECTOMY    . TREATMENT FISTULA ANAL    . TUBAL LIGATION      Social History  Substance Use Topics  . Smoking status: Former Smoker    Packs/day: 1.50    Years: 56.00    Types:  Cigarettes    Quit date: 04/29/2012  . Smokeless tobacco: Never Used     Comment: quit smoking 3 years ago  . Alcohol use No     Comment: quit drinking beer 2005    Family History  Problem Relation Age of Onset  . COPD Father        smoker deceased.   . Emphysema Father   . Lupus Sister   . Heart attack Son   . Heart disease Son   . Hypertension Son   . Hypertension Son     Allergies  Allergen Reactions  . Aspirin Other (See Comments)    Other reaction(s): PALPITATIONS Other reaction(s): DIFFICULTY BREATHING Heart flutter  . Incruse Ellipta [Umeclidinium Bromide] Shortness Of Breath  . Tramadol Shortness Of Breath  . Amlodipine Other (See Comments)    Other reaction(s): SWELLING  . Codeine Other (See Comments)    Hallucinations, can take Tylenol #3 now  . Doxycycline Other (See Comments)    Other reaction(s): NAUSEA,VOMITING  . Gabapentin Other (See Comments)    Other reaction(s): Other (See Comments) She could not swallow the large pill  . Hydrocodone Other (See Comments)    hallucinations  . Losartan Other (See Comments)    unknown  . Propoxyphene Nausea Only  . Tiotropium Itching and Rash    Medication list has been reviewed and updated.  Current Outpatient Prescriptions on File Prior to Visit  Medication Sig Dispense Refill  . acetaminophen-codeine (TYLENOL #3) 300-30 MG tablet Take 1 tablet by mouth every 6 (six) hours as needed for moderate pain. 30 tablet 1  . Acidophilus Lactobacillus CAPS Take 1  capsule by mouth daily with breakfast.    . albuterol (PROVENTIL HFA;VENTOLIN HFA) 108 (90 Base) MCG/ACT inhaler Inhale 2 puffs into the lungs every 4 (four) hours as needed for wheezing or shortness of breath. 2 Inhaler 6  . albuterol (PROVENTIL) (2.5 MG/3ML) 0.083% nebulizer solution Take 3 mLs (2.5 mg total) by nebulization every 6 (six) hours as needed for wheezing or shortness of breath. 375 mL 0  . apixaban (ELIQUIS) 2.5 MG TABS tablet Take 1 tablet (2.5 mg total) by mouth 2 (two) times daily. 60 tablet 3  . cholecalciferol (VITAMIN D) 1000 units tablet Take 1,000 Units by mouth daily.    . fluticasone furoate-vilanterol (BREO ELLIPTA) 200-25 MCG/INH AEPB Inhale 1 puff into the lungs daily. 2 each 0  . Lactobacillus Acidophilus POWD Take by mouth.    . Multiple Vitamins-Minerals (MULTIVITAMIN & MINERAL PO) Take 1 tablet by mouth daily.    . Nutritional Supplements (ENSURE ENLIVE PO) Take 237 mLs by mouth 2 (two) times daily.    . OXYGEN Inhale 2 L into the lungs daily as needed. To maintain o2 saturation of 90%    . ranitidine (ZANTAC) 300 MG capsule Take 1 capsule (300 mg total) by mouth every evening. 30 capsule 11  . solifenacin (VESICARE) 5 MG tablet Take 5 mg by mouth.     No current facility-administered medications on file prior to visit.     Review of Systems:  As per HPI- otherwise negative.   Physical Examination: Vitals:   05/06/17 0907  BP: 110/62  Pulse: 73  Temp: 98.3 F (36.8 C)   Vitals:   05/06/17 0907  Height: 5' 5.5" (1.664 m)   There is no height or weight on file to calculate BMI. Ideal Body Weight: Weight in (lb) to have BMI = 25: 152.2  GEN: WDWN, NAD, Non-toxic, A &  O x 3, cachetic, appears chronically ill but her normal self today HEENT: Atraumatic, Normocephalic. Neck supple. No masses, No LAD.  Bilateral TM wnl, oropharynx normal.  PEERL,EOMI.   Reassured that her bilateral nares appear normal Ears and Nose: No external deformity. CV: RRR, No  M/G/R. No JVD. No thrill. No extra heart sounds. PULM: CTA B, no wheezes, crackles, rhonchi. No retractions. No resp. distress. No accessory muscle use. Wearing O2 with nasal canula ABD: S, NT, ND. No rebound. No HSM. EXTR: No c/c/e NEURO Normal gait.  PSYCH: Normally interactive. Conversant. Not depressed or anxious appearing.  Calm demeanor.   EKG: SNR, compared with past EKG from June, I do not appreciate any particular change or acute abnl Assessment and Plan: End stage COPD ([place])  Oxygen dependent  Loss of weight  Generalized anxiety disorder  Chest pain, unspecified type - Plan: EKG 12-Lead, Troponin I  Serum calcium elevated - Plan: Comprehensive metabolic panel  Here today with several concerns She has end stage COPD and is generally quite anxious  Advised that she is ok to use chocolate flavored ensure, and that her nose appears normal She also mentions ?chest pain- she does not claim chest pain but her description sounds like chest pain.  EKG is reassuring today, will also obtain a troponin level for further reassurance She also reports bumping her head approx 1 month ago.  At this time there is no evidence of any serious injury so will not order a head scan Shawne is pretty upset with me today because I am not able to tell her why her body may seem to occasionally jump when she is watching TV.  Apologized that I do not know the cause of this phenomena, but I do not think it is anything serious.  Advised that she can certainly seek another opinion about this issue if she likes "Your primary doctor is supposed to help you, I go to the doctor so you can help me and you won't help."   Signed Lamar Blinks, MD  Results for orders placed or performed in visit on 05/06/17  Troponin I  Result Value Ref Range   TNIDX 0.00 0.00 - 0.06 ug/l  Comprehensive metabolic panel  Result Value Ref Range   Sodium 142 135 - 145 mEq/L   Potassium 3.5 3.5 - 5.1 mEq/L   Chloride 95 (L)  96 - 112 mEq/L   CO2 44 (H) 19 - 32 mEq/L   Glucose, Bld 97 70 - 99 mg/dL   BUN 14 6 - 23 mg/dL   Creatinine, Ser 0.55 0.40 - 1.20 mg/dL   Total Bilirubin 0.6 0.2 - 1.2 mg/dL   Alkaline Phosphatase 56 39 - 117 U/L   AST 39 (H) 0 - 37 U/L   ALT 19 0 - 35 U/L   Total Protein 6.9 6.0 - 8.3 g/dL   Albumin 4.2 3.5 - 5.2 g/dL   Calcium 9.8 8.4 - 10.5 mg/dL   GFR 137.15 >60.00 mL/min

## 2017-05-05 DIAGNOSIS — J439 Emphysema, unspecified: Secondary | ICD-10-CM | POA: Diagnosis not present

## 2017-05-05 DIAGNOSIS — D509 Iron deficiency anemia, unspecified: Secondary | ICD-10-CM | POA: Diagnosis not present

## 2017-05-05 DIAGNOSIS — R1312 Dysphagia, oropharyngeal phase: Secondary | ICD-10-CM | POA: Diagnosis not present

## 2017-05-05 DIAGNOSIS — I2510 Atherosclerotic heart disease of native coronary artery without angina pectoris: Secondary | ICD-10-CM | POA: Diagnosis not present

## 2017-05-05 DIAGNOSIS — I700 Atherosclerosis of aorta: Secondary | ICD-10-CM | POA: Diagnosis not present

## 2017-05-05 DIAGNOSIS — M5137 Other intervertebral disc degeneration, lumbosacral region: Secondary | ICD-10-CM | POA: Diagnosis not present

## 2017-05-05 DIAGNOSIS — J9611 Chronic respiratory failure with hypoxia: Secondary | ICD-10-CM | POA: Diagnosis not present

## 2017-05-05 DIAGNOSIS — D329 Benign neoplasm of meninges, unspecified: Secondary | ICD-10-CM | POA: Diagnosis not present

## 2017-05-05 DIAGNOSIS — I10 Essential (primary) hypertension: Secondary | ICD-10-CM | POA: Diagnosis not present

## 2017-05-05 DIAGNOSIS — I2609 Other pulmonary embolism with acute cor pulmonale: Secondary | ICD-10-CM | POA: Diagnosis not present

## 2017-05-05 DIAGNOSIS — E43 Unspecified severe protein-calorie malnutrition: Secondary | ICD-10-CM | POA: Diagnosis not present

## 2017-05-05 DIAGNOSIS — J9612 Chronic respiratory failure with hypercapnia: Secondary | ICD-10-CM | POA: Diagnosis not present

## 2017-05-05 DIAGNOSIS — J180 Bronchopneumonia, unspecified organism: Secondary | ICD-10-CM | POA: Diagnosis not present

## 2017-05-06 ENCOUNTER — Ambulatory Visit (INDEPENDENT_AMBULATORY_CARE_PROVIDER_SITE_OTHER): Payer: Medicare Other | Admitting: Family Medicine

## 2017-05-06 VITALS — BP 110/62 | HR 73 | Temp 98.3°F | Ht 65.5 in

## 2017-05-06 DIAGNOSIS — R634 Abnormal weight loss: Secondary | ICD-10-CM | POA: Diagnosis not present

## 2017-05-06 DIAGNOSIS — J449 Chronic obstructive pulmonary disease, unspecified: Secondary | ICD-10-CM | POA: Diagnosis not present

## 2017-05-06 DIAGNOSIS — Z9981 Dependence on supplemental oxygen: Secondary | ICD-10-CM

## 2017-05-06 DIAGNOSIS — F411 Generalized anxiety disorder: Secondary | ICD-10-CM | POA: Diagnosis not present

## 2017-05-06 DIAGNOSIS — R079 Chest pain, unspecified: Secondary | ICD-10-CM

## 2017-05-06 LAB — COMPREHENSIVE METABOLIC PANEL
ALT: 19 U/L (ref 0–35)
AST: 39 U/L — ABNORMAL HIGH (ref 0–37)
Albumin: 4.2 g/dL (ref 3.5–5.2)
Alkaline Phosphatase: 56 U/L (ref 39–117)
BUN: 14 mg/dL (ref 6–23)
CO2: 44 mEq/L — ABNORMAL HIGH (ref 19–32)
Calcium: 9.8 mg/dL (ref 8.4–10.5)
Chloride: 95 mEq/L — ABNORMAL LOW (ref 96–112)
Creatinine, Ser: 0.55 mg/dL (ref 0.40–1.20)
GFR: 137.15 mL/min (ref >60.00)
Glucose, Bld: 97 mg/dL (ref 70–99)
Potassium: 3.5 mEq/L (ref 3.5–5.1)
Sodium: 142 mEq/L (ref 135–145)
Total Bilirubin: 0.6 mg/dL (ref 0.2–1.2)
Total Protein: 6.9 g/dL (ref 6.0–8.3)

## 2017-05-06 LAB — TROPONIN I: TNIDX: 0 ug/l (ref 0.00–0.06)

## 2017-05-06 NOTE — Patient Instructions (Signed)
It was good to see you today I am going to check on your troponin level to help make sure that your heart is ok Please have your blood drawn, and then you can go home- I will let you know if your troponin is high today

## 2017-05-08 ENCOUNTER — Telehealth: Payer: Self-pay | Admitting: Family Medicine

## 2017-05-08 NOTE — Telephone Encounter (Signed)
Tammy with [facility] called in to request verbal orders to continue to working with pt for COPD 1 time a week for 9 weeks.    CB: # [id number]

## 2017-05-08 NOTE — Telephone Encounter (Signed)
Called Tammy.  Verbal order given pending faxed order.

## 2017-05-10 ENCOUNTER — Encounter: Payer: Self-pay | Admitting: Family Medicine

## 2017-05-11 ENCOUNTER — Telehealth: Payer: Self-pay | Admitting: *Deleted

## 2017-05-11 NOTE — Telephone Encounter (Signed)
Patient called giving the results from a "circulation test" [employer] did today.  Dr. Marin Olp notified.  Patient on Blood thinner.  Patient sees Dr. Marin Olp on Friday to follow up.  Patient saw Primary care doctor on 05/06/17

## 2017-05-12 ENCOUNTER — Other Ambulatory Visit: Payer: Self-pay | Admitting: Family Medicine

## 2017-05-12 DIAGNOSIS — J449 Chronic obstructive pulmonary disease, unspecified: Secondary | ICD-10-CM

## 2017-05-13 ENCOUNTER — Telehealth: Payer: Self-pay | Admitting: *Deleted

## 2017-05-13 DIAGNOSIS — I10 Essential (primary) hypertension: Secondary | ICD-10-CM | POA: Diagnosis not present

## 2017-05-13 DIAGNOSIS — I2609 Other pulmonary embolism with acute cor pulmonale: Secondary | ICD-10-CM | POA: Diagnosis not present

## 2017-05-13 DIAGNOSIS — R1312 Dysphagia, oropharyngeal phase: Secondary | ICD-10-CM | POA: Diagnosis not present

## 2017-05-13 DIAGNOSIS — I700 Atherosclerosis of aorta: Secondary | ICD-10-CM | POA: Diagnosis not present

## 2017-05-13 DIAGNOSIS — D509 Iron deficiency anemia, unspecified: Secondary | ICD-10-CM | POA: Diagnosis not present

## 2017-05-13 DIAGNOSIS — M5137 Other intervertebral disc degeneration, lumbosacral region: Secondary | ICD-10-CM | POA: Diagnosis not present

## 2017-05-13 DIAGNOSIS — D329 Benign neoplasm of meninges, unspecified: Secondary | ICD-10-CM | POA: Diagnosis not present

## 2017-05-13 DIAGNOSIS — J180 Bronchopneumonia, unspecified organism: Secondary | ICD-10-CM | POA: Diagnosis not present

## 2017-05-13 DIAGNOSIS — J9612 Chronic respiratory failure with hypercapnia: Secondary | ICD-10-CM | POA: Diagnosis not present

## 2017-05-13 DIAGNOSIS — J9611 Chronic respiratory failure with hypoxia: Secondary | ICD-10-CM | POA: Diagnosis not present

## 2017-05-13 DIAGNOSIS — I2510 Atherosclerotic heart disease of native coronary artery without angina pectoris: Secondary | ICD-10-CM | POA: Diagnosis not present

## 2017-05-13 DIAGNOSIS — E43 Unspecified severe protein-calorie malnutrition: Secondary | ICD-10-CM | POA: Diagnosis not present

## 2017-05-13 DIAGNOSIS — J439 Emphysema, unspecified: Secondary | ICD-10-CM | POA: Diagnosis not present

## 2017-05-13 NOTE — Telephone Encounter (Signed)
Received Physician Orders for Missed Visits [x5] from [facility]; forwarded to provider/SLS 08/15

## 2017-05-15 ENCOUNTER — Ambulatory Visit (HOSPITAL_BASED_OUTPATIENT_CLINIC_OR_DEPARTMENT_OTHER): Payer: Medicare Other | Admitting: Hematology & Oncology

## 2017-05-15 ENCOUNTER — Other Ambulatory Visit (HOSPITAL_BASED_OUTPATIENT_CLINIC_OR_DEPARTMENT_OTHER): Payer: Medicare Other

## 2017-05-15 ENCOUNTER — Ambulatory Visit (HOSPITAL_BASED_OUTPATIENT_CLINIC_OR_DEPARTMENT_OTHER)
Admission: RE | Admit: 2017-05-15 | Discharge: 2017-05-15 | Disposition: A | Discharge status: Home / Self Care | Payer: Medicare Other | Source: Ambulatory Visit | Attending: Hematology & Oncology | Admitting: Hematology & Oncology

## 2017-05-15 VITALS — BP 150/77 | HR 89 | Temp 98.5°F | Resp 22 | Wt 89.8 lb

## 2017-05-15 DIAGNOSIS — K909 Intestinal malabsorption, unspecified: Secondary | ICD-10-CM | POA: Insufficient documentation

## 2017-05-15 DIAGNOSIS — Z86718 Personal history of other venous thrombosis and embolism: Secondary | ICD-10-CM

## 2017-05-15 DIAGNOSIS — J449 Chronic obstructive pulmonary disease, unspecified: Secondary | ICD-10-CM | POA: Diagnosis not present

## 2017-05-15 DIAGNOSIS — M79605 Pain in left leg: Secondary | ICD-10-CM | POA: Insufficient documentation

## 2017-05-15 DIAGNOSIS — D500 Iron deficiency anemia secondary to blood loss (chronic): Secondary | ICD-10-CM | POA: Diagnosis not present

## 2017-05-15 DIAGNOSIS — I82411 Acute embolism and thrombosis of right femoral vein: Secondary | ICD-10-CM

## 2017-05-15 DIAGNOSIS — D509 Iron deficiency anemia, unspecified: Secondary | ICD-10-CM | POA: Diagnosis not present

## 2017-05-15 DIAGNOSIS — I2609 Other pulmonary embolism with acute cor pulmonale: Secondary | ICD-10-CM

## 2017-05-15 DIAGNOSIS — I2782 Chronic pulmonary embolism: Secondary | ICD-10-CM

## 2017-05-15 LAB — COMPREHENSIVE METABOLIC PANEL
ALBUMIN: 3.8 g/dL (ref 3.5–5.0)
ALK PHOS: 61 U/L (ref 40–150)
ALT: 22 U/L (ref 0–55)
ANION GAP: 8 meq/L (ref 3–11)
AST: 48 U/L — ABNORMAL HIGH (ref 5–34)
BUN: 11.2 mg/dL (ref 7.0–26.0)
CALCIUM: 9.9 mg/dL (ref 8.4–10.4)
CHLORIDE: 98 meq/L (ref 98–109)
CO2: 39 mEq/L — ABNORMAL HIGH (ref 22–29)
CREATININE: 0.6 mg/dL (ref 0.6–1.1)
EGFR: >90 mL/min/{1.73_m2} (ref >90)
Glucose: 94 mg/dl (ref 70–140)
POTASSIUM: 3.3 meq/L — AB (ref 3.5–5.1)
Sodium: 145 mEq/L (ref 136–145)
Total Bilirubin: 0.48 mg/dL (ref 0.20–1.20)
Total Protein: 6.9 g/dL (ref 6.4–8.3)

## 2017-05-15 LAB — CBC WITH DIFFERENTIAL (CANCER CENTER ONLY)
BASO#: 0 10*3/uL (ref 0.0–0.2)
BASO%: 0.3 % (ref 0.0–2.0)
EOS%: 1.1 % (ref 0.0–7.0)
Eosinophils Absolute: 0.1 10*3/uL (ref 0.0–0.5)
HEMATOCRIT: 38.2 % (ref 34.8–46.6)
HGB: 11.2 g/dL — ABNORMAL LOW (ref 11.6–15.9)
LYMPH#: 2.1 10*3/uL (ref 0.9–3.3)
LYMPH%: 29.9 % (ref 14.0–48.0)
MCH: 22.4 pg — AB (ref 26.0–34.0)
MCHC: 29.3 g/dL — AB (ref 32.0–36.0)
MCV: 77 fL — AB (ref 81–101)
MONO#: 0.7 10*3/uL (ref 0.1–0.9)
MONO%: 9.7 % (ref 0.0–13.0)
NEUT#: 4.2 10*3/uL (ref 1.5–6.5)
NEUT%: 59 % (ref 39.6–80.0)
Platelets: 304 10*3/uL (ref 145–400)
RBC: 4.99 10*6/uL (ref 3.70–5.32)
RDW: 18.7 % — AB (ref 11.1–15.7)
WBC: 7.1 10*3/uL (ref 3.9–10.0)

## 2017-05-15 LAB — IRON AND TIBC
%SAT: 30 % (ref 21–57)
IRON: 103 ug/dL (ref 41–142)
TIBC: 346 ug/dL (ref 236–444)
UIBC: 244 ug/dL (ref 120–384)

## 2017-05-15 LAB — FERRITIN: FERRITIN: 129 ng/mL (ref 9–269)

## 2017-05-15 NOTE — Progress Notes (Signed)
Hematology and Oncology Follow Up Visit  Holly Page [id number] [birth year] 81 y.o. 05/15/2017   Principle Diagnosis:  Thromboembolic disease of the right thigh Superficial thrombus of the left arm  Pulmonary Embolism - dx in 11/2016  Current Therapy:   Eliquis 5 mg po BID - reduced to 2.5 mg PO BID today (04/17/2017)   Interim History:  Holly Page is here today for follow-up. She has the oxygen that she wears all the time. She has fairly significant CO- PD. She was worried about a test that she had done at home. Apparently, a nurse comes out to her house. It looks say they did a ABI test. This, I told Holly Page, was for peripheral vascular disease. The results looked okay. There  is no significant blockage from what I can tell.   She is complaining of pain in the back of her left thigh. There's been some swelling area and she wants to have a Doppler done. I think this is reasonable.  She is not having any more urinary bleeding. This was an issue for her. She had a UTI.  Of note, her last CT angiogram was back in June. Everything looked fine with no evidence of residual thromboembolic disease.  Her appetite is doing okay. She's had no nausea or vomiting.  Overall, as a performance status is ECOG 2-3.   Medications:  Allergies as of 05/15/2017      Reactions   Aspirin Other (See Comments)   Other reaction(s): PALPITATIONS Other reaction(s): DIFFICULTY BREATHING Heart flutter   Incruse Ellipta [umeclidinium Bromide] Shortness Of Breath   Tramadol Shortness Of Breath   Amlodipine Other (See Comments)   Other reaction(s): SWELLING   Codeine Other (See Comments)   Hallucinations, can take Tylenol #3 now   Doxycycline Other (See Comments)   Other reaction(s): NAUSEA,VOMITING   Gabapentin Other (See Comments)   Other reaction(s): Other (See Comments) She could not swallow the large pill   Hydrocodone Other (See Comments)   hallucinations   Losartan Other (See  Comments)   unknown   Propoxyphene Nausea Only   Tiotropium Itching, Rash      Medication List       Accurate as of 05/15/17  9:46 AM. Always use your most recent med list.          acetaminophen-codeine 300-30 MG tablet Commonly known as:  TYLENOL #3 Take 1 tablet by mouth every 6 (six) hours as needed for moderate pain.   albuterol (2.5 MG/3ML) 0.083% nebulizer solution Commonly known as:  PROVENTIL Take 3 mLs (2.5 mg total) by nebulization every 6 (six) hours as needed for wheezing or shortness of breath.   apixaban 2.5 MG Tabs tablet Commonly known as:  ELIQUIS Take 1 tablet (2.5 mg total) by mouth 2 (two) times daily.   cholecalciferol 1000 units tablet Commonly known as:  VITAMIN D Take 1,000 Units by mouth daily.   ENSURE ENLIVE PO Take 237 mLs by mouth 2 (two) times daily.   fluticasone furoate-vilanterol 200-25 MCG/INH Aepb Commonly known as:  BREO ELLIPTA Inhale 1 puff into the lungs daily.   MULTIVITAMIN & MINERAL PO Take 1 tablet by mouth daily.   OXYGEN Inhale 2 L into the lungs daily as needed. To maintain o2 saturation of 90%   ranitidine 300 MG capsule Commonly known as:  ZANTAC Take 1 capsule (300 mg total) by mouth every evening.   solifenacin 5 MG tablet Commonly known as:  VESICARE Take 5 mg by mouth.  Allergies:  Allergies  Allergen Reactions  . Aspirin Other (See Comments)    Other reaction(s): PALPITATIONS Other reaction(s): DIFFICULTY BREATHING Heart flutter  . Incruse Ellipta [Umeclidinium Bromide] Shortness Of Breath  . Tramadol Shortness Of Breath  . Amlodipine Other (See Comments)    Other reaction(s): SWELLING  . Codeine Other (See Comments)    Hallucinations, can take Tylenol #3 now  . Doxycycline Other (See Comments)    Other reaction(s): NAUSEA,VOMITING  . Gabapentin Other (See Comments)    Other reaction(s): Other (See Comments) She could not swallow the large pill  . Hydrocodone Other (See Comments)     hallucinations  . Losartan Other (See Comments)    unknown  . Propoxyphene Nausea Only  . Tiotropium Itching and Rash    Past Medical History, Surgical history, Social history, and Family History were reviewed and updated.  Review of Systems: All other 10 point review of systems is negative.   Physical Exam:  weight is 89 lb 12 oz (40.7 kg). Her oral temperature is 98.5 F (36.9 C). Her blood pressure is 150/77 (abnormal) and her pulse is 89. Her respiration is 22 (abnormal) and oxygen saturation is 93%.   Wt Readings from Last 3 Encounters:  05/15/17 89 lb 12 oz (40.7 kg)  04/30/17 88 lb (39.9 kg)  04/17/17 82 lb (37.2 kg)    Thin African-American female. Head and neck exam shows no ocular or oral lesions. She has no palpable cervical or supraclavicular lymph nodes. Lungs are with decreased breath sounds throughout both lung fields. She has some slight expiratory wheezes. She has some scattered rhonchi. Cardiac exam tachycardic but regular. She has occasional extra beat. Abdomen is soft. Bowel sounds are slightly decreased. She has no guarding or rebound tenderness. Extremities shows marked muscle atrophy in the upper and lower extremities. She has no palpable venous cord in the legs.   Lab Results  Component Value Date   WBC 7.1 05/15/2017   HGB 11.2 (L) 05/15/2017   HCT 38.2 05/15/2017   MCV 77 (L) 05/15/2017   PLT 304 05/15/2017   Lab Results  Component Value Date   FERRITIN 95 04/17/2017   IRON 120 04/17/2017   TIBC 356 04/17/2017   UIBC 237 04/17/2017   IRONPCTSAT 34 04/17/2017   Lab Results  Component Value Date   RBC 4.99 05/15/2017   No results found for: KPAFRELGTCHN, LAMBDASER, KAPLAMBRATIO No results found for: IGGSERUM, IGA, IGMSERUM No results found for: Odetta Pink, SPEI   Chemistry      Component Value Date/Time   NA 142 05/06/2017 0951   NA 145 04/17/2017 0902   K 3.5 05/06/2017 0951   K 3.7  04/17/2017 0902   CL 95 (L) 05/06/2017 0951   CL 102 03/06/2017 1311   CO2 44 (H) 05/06/2017 0951   CO2 36 (H) 04/17/2017 0902   BUN 14 05/06/2017 0951   BUN 12.9 04/17/2017 0902   CREATININE 0.55 05/06/2017 0951   CREATININE 0.6 04/17/2017 0902   GLU 105 12/25/2016      Component Value Date/Time   CALCIUM 9.8 05/06/2017 0951   CALCIUM 10.7 (H) 04/17/2017 0902   ALKPHOS 56 05/06/2017 0951   ALKPHOS 71 04/17/2017 0902   AST 39 (H) 05/06/2017 0951   AST 57 (H) 04/17/2017 0902   ALT 19 05/06/2017 0951   ALT 22 04/17/2017 0902   BILITOT 0.6 05/06/2017 0951   BILITOT 0.45 04/17/2017 0902  Impression and Plan: Ms. Vogan is a 81 yo [place] American female with multiple health problems. Her COPD seems to be her biggest issue at this time.   I don't think that there is a blood clot in the left leg. However, I believe that we have to get a Doppler to make sure of this. Mrs. Cashatt would be much more relieved to have a Doppler.  We talked about the peripheral vascular issues. Her ABI studies which were done at home actually looked pretty decent.   She is not bleeding from the [facility]. As such we will continue her on the [facility].   As always, her underlying COPD will be the determining of her overall prognosis and mortality.   I spent about 25-30 minutes with her. I reviewed all of her labs. We ordered all of her tests. We will see her back in 3 months.   Volanda Napoleon, MD 8/17/20189:46 AM

## 2017-05-18 ENCOUNTER — Telehealth: Payer: Self-pay | Admitting: *Deleted

## 2017-05-18 NOTE — Telephone Encounter (Addendum)
Patient is aware of results  ----- Message from Volanda Napoleon, MD sent at 05/15/2017  5:31 PM EDT ----- Call - NO blood clot in the left leg!!  Laurey Arrow

## 2017-05-20 DIAGNOSIS — M5137 Other intervertebral disc degeneration, lumbosacral region: Secondary | ICD-10-CM | POA: Diagnosis not present

## 2017-05-20 DIAGNOSIS — J9612 Chronic respiratory failure with hypercapnia: Secondary | ICD-10-CM | POA: Diagnosis not present

## 2017-05-20 DIAGNOSIS — J180 Bronchopneumonia, unspecified organism: Secondary | ICD-10-CM | POA: Diagnosis not present

## 2017-05-20 DIAGNOSIS — J9611 Chronic respiratory failure with hypoxia: Secondary | ICD-10-CM | POA: Diagnosis not present

## 2017-05-20 DIAGNOSIS — I2510 Atherosclerotic heart disease of native coronary artery without angina pectoris: Secondary | ICD-10-CM | POA: Diagnosis not present

## 2017-05-20 DIAGNOSIS — D329 Benign neoplasm of meninges, unspecified: Secondary | ICD-10-CM | POA: Diagnosis not present

## 2017-05-20 DIAGNOSIS — R1312 Dysphagia, oropharyngeal phase: Secondary | ICD-10-CM | POA: Diagnosis not present

## 2017-05-20 DIAGNOSIS — I700 Atherosclerosis of aorta: Secondary | ICD-10-CM | POA: Diagnosis not present

## 2017-05-20 DIAGNOSIS — J439 Emphysema, unspecified: Secondary | ICD-10-CM | POA: Diagnosis not present

## 2017-05-20 DIAGNOSIS — D509 Iron deficiency anemia, unspecified: Secondary | ICD-10-CM | POA: Diagnosis not present

## 2017-05-20 DIAGNOSIS — I2609 Other pulmonary embolism with acute cor pulmonale: Secondary | ICD-10-CM | POA: Diagnosis not present

## 2017-05-20 DIAGNOSIS — I10 Essential (primary) hypertension: Secondary | ICD-10-CM | POA: Diagnosis not present

## 2017-05-20 DIAGNOSIS — E43 Unspecified severe protein-calorie malnutrition: Secondary | ICD-10-CM | POA: Diagnosis not present

## 2017-05-23 ENCOUNTER — Other Ambulatory Visit: Payer: Self-pay | Admitting: Family Medicine

## 2017-05-25 ENCOUNTER — Ambulatory Visit: Payer: Medicare Other | Admitting: Family Medicine

## 2017-05-26 DIAGNOSIS — J449 Chronic obstructive pulmonary disease, unspecified: Secondary | ICD-10-CM | POA: Diagnosis not present

## 2017-05-27 DIAGNOSIS — I10 Essential (primary) hypertension: Secondary | ICD-10-CM | POA: Diagnosis not present

## 2017-05-27 DIAGNOSIS — J9611 Chronic respiratory failure with hypoxia: Secondary | ICD-10-CM | POA: Diagnosis not present

## 2017-05-27 DIAGNOSIS — J180 Bronchopneumonia, unspecified organism: Secondary | ICD-10-CM | POA: Diagnosis not present

## 2017-05-27 DIAGNOSIS — D329 Benign neoplasm of meninges, unspecified: Secondary | ICD-10-CM | POA: Diagnosis not present

## 2017-05-27 DIAGNOSIS — R1312 Dysphagia, oropharyngeal phase: Secondary | ICD-10-CM | POA: Diagnosis not present

## 2017-05-27 DIAGNOSIS — J9612 Chronic respiratory failure with hypercapnia: Secondary | ICD-10-CM | POA: Diagnosis not present

## 2017-05-27 DIAGNOSIS — I2510 Atherosclerotic heart disease of native coronary artery without angina pectoris: Secondary | ICD-10-CM | POA: Diagnosis not present

## 2017-05-27 DIAGNOSIS — I700 Atherosclerosis of aorta: Secondary | ICD-10-CM | POA: Diagnosis not present

## 2017-05-27 DIAGNOSIS — J439 Emphysema, unspecified: Secondary | ICD-10-CM | POA: Diagnosis not present

## 2017-05-27 DIAGNOSIS — E43 Unspecified severe protein-calorie malnutrition: Secondary | ICD-10-CM | POA: Diagnosis not present

## 2017-05-27 DIAGNOSIS — D509 Iron deficiency anemia, unspecified: Secondary | ICD-10-CM | POA: Diagnosis not present

## 2017-05-27 DIAGNOSIS — I2609 Other pulmonary embolism with acute cor pulmonale: Secondary | ICD-10-CM | POA: Diagnosis not present

## 2017-05-27 DIAGNOSIS — M5137 Other intervertebral disc degeneration, lumbosacral region: Secondary | ICD-10-CM | POA: Diagnosis not present

## 2017-05-28 ENCOUNTER — Ambulatory Visit: Payer: Medicare Other | Admitting: Adult Health

## 2017-06-02 ENCOUNTER — Telehealth: Payer: Self-pay | Admitting: Adult Health

## 2017-06-02 ENCOUNTER — Telehealth: Payer: Self-pay | Admitting: *Deleted

## 2017-06-02 DIAGNOSIS — D509 Iron deficiency anemia, unspecified: Secondary | ICD-10-CM | POA: Diagnosis not present

## 2017-06-02 DIAGNOSIS — J180 Bronchopneumonia, unspecified organism: Secondary | ICD-10-CM | POA: Diagnosis not present

## 2017-06-02 DIAGNOSIS — D329 Benign neoplasm of meninges, unspecified: Secondary | ICD-10-CM | POA: Diagnosis not present

## 2017-06-02 DIAGNOSIS — E43 Unspecified severe protein-calorie malnutrition: Secondary | ICD-10-CM | POA: Diagnosis not present

## 2017-06-02 DIAGNOSIS — I700 Atherosclerosis of aorta: Secondary | ICD-10-CM | POA: Diagnosis not present

## 2017-06-02 DIAGNOSIS — R1312 Dysphagia, oropharyngeal phase: Secondary | ICD-10-CM | POA: Diagnosis not present

## 2017-06-02 DIAGNOSIS — I2510 Atherosclerotic heart disease of native coronary artery without angina pectoris: Secondary | ICD-10-CM | POA: Diagnosis not present

## 2017-06-02 DIAGNOSIS — I10 Essential (primary) hypertension: Secondary | ICD-10-CM | POA: Diagnosis not present

## 2017-06-02 DIAGNOSIS — M5137 Other intervertebral disc degeneration, lumbosacral region: Secondary | ICD-10-CM | POA: Diagnosis not present

## 2017-06-02 DIAGNOSIS — I2609 Other pulmonary embolism with acute cor pulmonale: Secondary | ICD-10-CM | POA: Diagnosis not present

## 2017-06-02 DIAGNOSIS — J9612 Chronic respiratory failure with hypercapnia: Secondary | ICD-10-CM | POA: Diagnosis not present

## 2017-06-02 DIAGNOSIS — J439 Emphysema, unspecified: Secondary | ICD-10-CM | POA: Diagnosis not present

## 2017-06-02 DIAGNOSIS — J9611 Chronic respiratory failure with hypoxia: Secondary | ICD-10-CM | POA: Diagnosis not present

## 2017-06-02 MED ORDER — PREDNISONE 10 MG PO TABS: ORAL_TABLET | ORAL | 0 refills | Status: DC

## 2017-06-02 NOTE — Telephone Encounter (Signed)
Patient states Rexene Edison is her provider. She is feeling bad. No one has contacted her regarding her condition. Patient is upset.  [phone].

## 2017-06-02 NOTE — Telephone Encounter (Signed)
Spoke with pt, aware of recs.  rx sent to preferred pharmacy.  Nothing further needed.  

## 2017-06-02 NOTE — Telephone Encounter (Signed)
lmtcb for pt. This message is flagged as urgent and will be addressed by end of day today.  We are currently waiting on MR's recs.  MR please advise.  Thanks.

## 2017-06-02 NOTE — Telephone Encounter (Signed)
Spoke with pt, requesting pred taper.  Pt c/o increased sob, chest tightness, fatigue Xseveral days.  Pt is scheduled to see TP on 9/6 but would like to start on prednisone today to help alleviate s/s.   [facility] uses [facility] [address].    Sending to DOD as RA is on vacation.  MR please advise on pred taper request.  Thanks!

## 2017-06-02 NOTE — Telephone Encounter (Signed)
Pt calling back about getting rx for Predisone.  Pharmacy [facility] [address]

## 2017-06-02 NOTE — Telephone Encounter (Signed)
Prednisone taper #20 , Prednisone 10mg   4 tabs for 2 days, then 3 tabs for 2 days, 2 tabs for 2 days, then 1 tab for 2 days, then stop Keep ov .  mucinex DM Twice daily  As needed  Cough/congestion  Please contact office for sooner follow up if symptoms do not improve or worsen or seek emergency care

## 2017-06-02 NOTE — Telephone Encounter (Signed)
MR please advise. Thanks! 

## 2017-06-02 NOTE — Telephone Encounter (Signed)
Pt is calling back and requesting to be called on home phone [phone]

## 2017-06-02 NOTE — Telephone Encounter (Signed)
Received Physician Orders for Plan of Care from [facility]; forwarded to provider/SLS 09/04

## 2017-06-02 NOTE — Telephone Encounter (Signed)
Pt is requesting TP's recs and not MR's recs. TP please advise on requested prednisone.  Thanks.

## 2017-06-04 ENCOUNTER — Ambulatory Visit (INDEPENDENT_AMBULATORY_CARE_PROVIDER_SITE_OTHER): Payer: Medicare Other | Admitting: Adult Health

## 2017-06-04 ENCOUNTER — Encounter: Payer: Self-pay | Admitting: Adult Health

## 2017-06-04 DIAGNOSIS — J439 Emphysema, unspecified: Secondary | ICD-10-CM | POA: Diagnosis not present

## 2017-06-04 DIAGNOSIS — J9611 Chronic respiratory failure with hypoxia: Secondary | ICD-10-CM

## 2017-06-04 NOTE — Assessment & Plan Note (Signed)
Cont on O2 .  

## 2017-06-04 NOTE — Patient Instructions (Addendum)
Continue on BREO 1 puff daily , rinse after use. (Try to take everyday) Taper prednisone as directed and then hold at 5mg  daily .   Continue on Albuterol Neb As needed   Continue on Oxygen 2l/m .  Ensure Twice daily  Between meals.  Follow up in 6-8 weeks  as planned and As needed    Please contact office for sooner follow up if symptoms do not improve or worsen or seek emergency care

## 2017-06-04 NOTE — Progress Notes (Signed)
@Patient  ID: Holly Page, female    DOB: 1936-01-04, 81 y.o.   MRN: [id number]  Chief Complaint  Patient presents with  . Follow-up    COPD     Referring provider: Copland, Gay Filler, MD  HPI: 80y.o. F with Gold D Copd primary emphysema on O2 >GOLD card patient  Hx of DVT (2015) , superficial Vein thrombus 2016 , on xarelto , followed by Hematology  PE 11/2016 (with Influenza and PNA) -life long anticoagulation  TEST /Events  Hx of DVT -05/2014 (tx w/ xarelto until 12/2014 ) -followed by hematology  Left arm superficial venous thrombus , Xarelto 01/25/15 >followed by Hematology >stopped 07/2015  CT chest, on March 19 2015 >that showed no evidence of pulmonary embolism and stable. Severe COPD changes. Spirometry showed FEV1 at 30% in 2015.  Intolerant to [place] ,[employer] and tudorza , Psychologist, forensic  Oxygen does drop with pulsing O2 on 2 L was able to keep above 90% on continuous flow  Cardiology eval HP per pt with a negative stress test recently.  Pulmonary rehab 2015 at [facility]   06/04/2017 Follow up : COPD , O2 RF , hx of PE/DVt  Patient returns for a one-month follow-up. Patient has known severe COPD that is oxygen dependent. Patient called in last week with increased shortness of breath and cough. She was called in a prednisone taper. Patient says that she is starting to feel better, cough and wheezing have decreased. She is currently on 20 mg of prednisone. She is prone to COPD exacerbations and requires frequent prednisone tapers. She remains on BREO daily. Uses albuterol as needed. She remains on oxygen 2 L. She declines flu shot. Previous admission March 2018 with pneumonia and influenza. She was also found to have a pulmonary was up. She is on Eliquis .  She is mainly sedentary. Family helps her with groceries in transportation. She lives alone.   Allergies  Allergen Reactions  . Aspirin Other (See Comments)    Other reaction(s): PALPITATIONS Other  reaction(s): DIFFICULTY BREATHING Heart flutter  . Incruse Ellipta [Umeclidinium Bromide] Shortness Of Breath  . Tramadol Shortness Of Breath  . Amlodipine Other (See Comments)    Other reaction(s): SWELLING  . Codeine Other (See Comments)    Hallucinations, can take Tylenol #3 now  . Doxycycline Other (See Comments)    Other reaction(s): NAUSEA,VOMITING  . Gabapentin Other (See Comments)    Other reaction(s): Other (See Comments) She could not swallow the large pill  . Hydrocodone Other (See Comments)    hallucinations  . Losartan Other (See Comments)    unknown  . Propoxyphene Nausea Only  . Tiotropium Itching and Rash    Immunization History  Administered Date(s) Administered  . Pneumococcal Conjugate-13 08/09/2015  . Pneumococcal Polysaccharide-23 09/29/2001, 09/29/2008, 04/26/2012  . Td 03/28/2009  . Tdap 04/04/2008    Past Medical History:  Diagnosis Date  . Anemia   . Arthritis   . CAD (coronary artery disease)   . Clotting disorder ([place])   . COPD (chronic obstructive pulmonary disease) ([place])   . DVT (deep venous thrombosis) ([place]) 1980s, recurrent 2015   Right DVT 2015  on xarelto  . Fibroid   . GERD (gastroesophageal reflux disease)   . Hypertension   . Hypertension   . Iron deficiency anemia due to chronic blood loss 01/23/2017  . 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 Kidney stone   . Lactose intolerance   . Meningioma ([place])    followed by  Dr Christella Noa  . Pneumonia   . Spinal stenosis     Tobacco History: History  Smoking Status  . Former Smoker  . Packs/day: 1.50  . Years: 56.00  . Types: Cigarettes  . Quit date: 04/29/2012  Smokeless Tobacco  . Never Used    Comment: quit smoking 3 years ago   Counseling given: Not Answered   Outpatient Encounter Prescriptions as of 06/04/2017  Medication Sig  . acetaminophen-codeine (TYLENOL #3) 300-30 MG tablet Take 1 tablet by mouth every 6 (six) hours as needed for moderate pain.  [name] albuterol (PROVENTIL) (2.5  MG/3ML) 0.083% nebulizer solution Take 3 mLs (2.5 mg total) by nebulization every 6 (six) hours as needed for wheezing or shortness of breath.  [name] apixaban (ELIQUIS) 2.5 MG TABS tablet Take 1 tablet (2.5 mg total) by mouth 2 (two) times daily.  . cholecalciferol (VITAMIN D) 1000 units tablet Take 1,000 Units by mouth daily.  . fluticasone furoate-vilanterol (BREO ELLIPTA) 200-25 MCG/INH AEPB Inhale 1 puff into the lungs daily.  . Multiple Vitamins-Minerals (MULTIVITAMIN & MINERAL PO) Take 1 tablet by mouth daily.  . Nutritional Supplements (ENSURE ENLIVE PO) Take 237 mLs by mouth 2 (two) times daily.  . OXYGEN Inhale 2 L into the lungs daily as needed. To maintain o2 saturation of 90%  . predniSONE (DELTASONE) 10 MG tablet 40mg X2 days, 30mg  X2 days, 20mg  X2 days, 10mg X2 days, then stop.  . ranitidine (ZANTAC) 300 MG capsule Take 1 capsule (300 mg total) by mouth every evening.  . solifenacin (VESICARE) 5 MG tablet Take 5 mg by mouth.   No facility-administered encounter medications on file as of 06/04/2017.      Review of Systems  Constitutional:   No  weight loss, night sweats,  Fevers, chills, + fatigue, or  lassitude.  HEENT:   No headaches,  Difficulty swallowing,  Tooth/dental problems, or  Sore throat,                No sneezing, itching, ear ache, nasal congestion, post nasal drip,   CV:  No chest pain,  Orthopnea, PND, swelling in lower extremities, anasarca, dizziness, palpitations, syncope.   GI  No heartburn, indigestion, abdominal pain, nausea, vomiting, diarrhea, change in bowel habits, loss of appetite, bloody stools.   Resp:  .  No chest wall deformity  Skin: no rash or lesions.  GU: no dysuria, change in color of urine, no urgency or frequency.  No flank pain, no hematuria   MS:  No joint pain or swelling.  No decreased range of motion.  No back pain.    Physical Exam  BP 131/74 (BP Location: Left Arm, Patient Position: Sitting, Cuff Size: Normal)   Pulse 76   Ht  5' 5.5" (1.664 m)   Wt 91 lb (41.3 kg)   SpO2 93%   BMI 14.91 kg/m   GEN: A/Ox3; pleasant , NAD, thin and frail on O2 in wc    HEENT:  [place]/AT,  EACs-clear, TMs-wnl, NOSE-clear, THROAT-clear, no lesions, no postnasal drip or exudate noted.   NECK:  Supple w/ fair ROM; no JVD; normal carotid impulses w/o bruits; no thyromegaly or nodules palpated; no lymphadenopathy.    RESP  Decreased BS in bases ,  no accessory muscle use, no dullness to percussion  CARD:  RRR, no m/r/g, no peripheral edema, pulses intact, no cyanosis or clubbing.  GI:   Soft & nt; nml bowel sounds; no organomegaly or masses detected.   Musco: Warm bil, no deformities  or joint swelling noted.   Neuro: alert, no focal deficits noted.    Skin: Warm, no lesions or rashes    Lab Results:  CBC   BMET  BNP ProBNP    Component Value Date/Time   PROBNP 49.2 09/14/2014 1246    Imaging: US Venous Img Lower Unilateral Left  Result Date: 05/15/2017 CLINICAL DATA:  Left leg pain. EXAM: LEFT LOWER EXTREMITY VENOUS DOPPLER ULTRASOUND TECHNIQUE: Gray-scale sonography with graded compression, as well as color Doppler and duplex ultrasound were performed to evaluate the lower extremity deep venous systems from the level of the common femoral vein and including the common femoral, femoral, profunda femoral, popliteal and calf veins including the posterior tibial, peroneal and gastrocnemius veins when visible. The superficial great saphenous vein was also interrogated. Spectral Doppler was utilized to evaluate flow at rest and with distal augmentation maneuvers in the common femoral, femoral and popliteal veins. COMPARISON:  No recent prior. FINDINGS: Contralateral Common Femoral Vein: Respiratory phasicity is normal and symmetric with the symptomatic side. No evidence of thrombus. Normal compressibility. Common Femoral Vein: No evidence of thrombus. Normal compressibility, respiratory phasicity and response to augmentation.  Saphenofemoral Junction: No evidence of thrombus. Normal compressibility and flow on color Doppler imaging. Profunda Femoral Vein: No evidence of thrombus. Normal compressibility and flow on color Doppler imaging. Femoral Vein: No evidence of thrombus. Normal compressibility, respiratory phasicity and response to augmentation. Popliteal Vein: No evidence of thrombus. Normal compressibility, respiratory phasicity and response to augmentation. Calf Veins: No evidence of thrombus. Normal compressibility and flow on color Doppler imaging. Superficial Great Saphenous Vein: No evidence of thrombus. Normal compressibility and flow on color Doppler imaging. Venous Reflux:  None. Other Findings:  None. IMPRESSION: No evidence of DVT within the left lower extremity. Electronically Signed   By: Marcello Moores  Register   On: 05/15/2017 12:00     Assessment & Plan:   COPD with emphysema Gold D Exacerbation , now resolving with steroid burst  Will taper to low dose prednisone to see if this helps with sx management .   Plan  Patient Instructions  Continue on BREO 1 puff daily , rinse after use. (Try to take everyday) Taper prednisone as directed and then hold at 5mg  daily .   Continue on Albuterol Neb As needed   Continue on Oxygen 2l/m .  Ensure Twice daily  Between meals.  Follow up in 6-8 weeks  as planned and As needed    Please contact office for sooner follow up if symptoms do not improve or worsen or seek emergency care      Chronic respiratory failure ([place]) Cont on O2      Buryl Bamber, NP 06/04/2017

## 2017-06-04 NOTE — Assessment & Plan Note (Signed)
Exacerbation , now resolving with steroid burst  Will taper to low dose prednisone to see if this helps with sx management .   Plan  Patient Instructions  Continue on BREO 1 puff daily , rinse after use. (Try to take everyday) Taper prednisone as directed and then hold at 5mg  daily .   Continue on Albuterol Neb As needed   Continue on Oxygen 2l/m .  Ensure Twice daily  Between meals.  Follow up in 6-8 weeks  as planned and As needed    Please contact office for sooner follow up if symptoms do not improve or worsen or seek emergency care

## 2017-06-09 ENCOUNTER — Telehealth: Payer: Self-pay | Admitting: Adult Health

## 2017-06-09 MED ORDER — PREDNISONE 5 MG PO TABS: 5.0000 mg | ORAL_TABLET | Freq: Every day | ORAL | 1 refills | Status: AC

## 2017-06-09 NOTE — Telephone Encounter (Signed)
Left message for patient to call back.   Per her chart, the prednisone was called into [facility] [address] last Thursday.

## 2017-06-09 NOTE — Telephone Encounter (Signed)
Pt returned call.-tr °

## 2017-06-09 NOTE — Telephone Encounter (Signed)
Spoke with pt, she states she took her last pill from the taper but TP wanted her to continue on Prednisone 10mg  daily after the taper. TP note below. I will send in Prednisone 5mg . Pt notified that Rx was sent into [facility]. Nothing further is needed.  Prior Versions: 1. Parrett, Fonnie Mu, NP (Nurse Practitioner) at 06/04/2017 10:36 AM - Signed    Continue on BREO 1 puff daily , rinse after use. (Try to take everyday) Taper prednisone as directed and then hold at 5mg  daily .   Continue on Albuterol Neb As needed   Continue on Oxygen 2l/m .  Ensure Twice daily  Between meals.  Follow up in 6-8 weeks  as planned and As needed    Please contact office for sooner follow up if symptoms do not improve or worsen or seek emergency care

## 2017-06-10 ENCOUNTER — Telehealth: Payer: Self-pay | Admitting: Medical

## 2017-06-10 ENCOUNTER — Ambulatory Visit (INDEPENDENT_AMBULATORY_CARE_PROVIDER_SITE_OTHER): Payer: Medicare Other | Admitting: Medical

## 2017-06-10 ENCOUNTER — Encounter: Payer: Self-pay | Admitting: Medical

## 2017-06-10 VITALS — BP 138/60 | HR 75 | Temp 98.2°F | Resp 14

## 2017-06-10 DIAGNOSIS — R350 Frequency of micturition: Secondary | ICD-10-CM

## 2017-06-10 DIAGNOSIS — Z8639 Personal history of other endocrine, nutritional and metabolic disease: Secondary | ICD-10-CM | POA: Diagnosis not present

## 2017-06-10 DIAGNOSIS — E876 Hypokalemia: Secondary | ICD-10-CM

## 2017-06-10 DIAGNOSIS — R252 Cramp and spasm: Secondary | ICD-10-CM | POA: Diagnosis not present

## 2017-06-10 LAB — POC URINALSYSI DIPSTICK (AUTOMATED)
Bilirubin, UA: NEGATIVE
Glucose, UA: NEGATIVE
Ketones, UA: NEGATIVE
LEUKOCYTES UA: NEGATIVE
NITRITE UA: NEGATIVE
PH UA: 8 (ref 5.0–8.0)
PROTEIN UA: NEGATIVE
Spec Grav, UA: 1.01 (ref 1.010–1.025)
UROBILINOGEN UA: NEGATIVE U/dL — AB

## 2017-06-10 LAB — MAGNESIUM: Magnesium: 2 mg/dL (ref 1.5–2.5)

## 2017-06-10 LAB — COMPREHENSIVE METABOLIC PANEL
ALK PHOS: 59 U/L (ref 39–117)
ALT: 19 U/L (ref 0–35)
AST: 43 U/L — ABNORMAL HIGH (ref 0–37)
Albumin: 4.2 g/dL (ref 3.5–5.2)
BILIRUBIN TOTAL: 0.5 mg/dL (ref 0.2–1.2)
BUN: 10 mg/dL (ref 6–23)
CALCIUM: 10.2 mg/dL (ref 8.4–10.5)
CO2: 42 meq/L — AB (ref 19–32)
Chloride: 94 mEq/L — ABNORMAL LOW (ref 96–112)
Creatinine, Ser: 0.44 mg/dL (ref 0.40–1.20)
GFR: 177.39 mL/min (ref >60.00)
Glucose, Bld: 102 mg/dL — ABNORMAL HIGH (ref 70–99)
Potassium: 3.4 mEq/L — ABNORMAL LOW (ref 3.5–5.1)
Sodium: 146 mEq/L — ABNORMAL HIGH (ref 135–145)
TOTAL PROTEIN: 6.7 g/dL (ref 6.0–8.3)

## 2017-06-10 MED ORDER — POTASSIUM CHLORIDE ER 10 MEQ PO TBCR: 10.0000 meq | EXTENDED_RELEASE_TABLET | Freq: Every day | ORAL | 0 refills | Status: AC

## 2017-06-10 NOTE — Progress Notes (Signed)
Subjective:    Patient ID: Holly Page, female    DOB: 11-Jun-1936, 81 y.o.   MRN: [id number]  HPI   Pt in past has been on potassium in the past. Pt states in past she had high k level and then Dr. Lorelei Pont took her off the k. Pt has concern that the level may be dropping. Pt occasional reports some occaiosnal mild transient leg cramp but not severe. No palpitations reported.  Pt is not on any diuretics.  Pt k level one month ago was 3.5. Pt states told to come off potassium since she was told was one point above normal?   Review of Systems  Constitutional: Negative for chills, fatigue and fever.  HENT: Negative for congestion, ear pain, mouth sores, nosebleeds, rhinorrhea, sinus pain and sinus pressure.   Respiratory: Negative for cough, chest tightness, shortness of breath and wheezing.        Pt reports some improvement since last seeing pulmonologist.  Cardiovascular: Negative for chest pain and palpitations.  Gastrointestinal: Negative for abdominal pain, constipation, diarrhea, nausea and vomiting.  Genitourinary: Positive for frequency. Negative for decreased urine volume, difficulty urinating, dysuria, hematuria, urgency and vaginal pain.       Just last night started urinating more. Pt is on vesicare.  She states ditropan not recommended if she has to go back on potassium.  Musculoskeletal: Negative for arthralgias, back pain, joint swelling and neck pain.       Mild occasional lower ext leg cramps  Skin: Negative for rash.  Neurological: Negative for dizziness, seizures, weakness, light-headedness, numbness and headaches.  Hematological: Negative for adenopathy. Does not bruise/bleed easily.  Psychiatric/Behavioral: Negative for behavioral problems, confusion, dysphoric mood, self-injury and suicidal ideas. The patient is not nervous/anxious and is not hyperactive.     Past Medical History:  Diagnosis Date  . Anemia   . Arthritis   . CAD (coronary artery  disease)   . Clotting disorder ([place])   . COPD (chronic obstructive pulmonary disease) ([place])   . DVT (deep venous thrombosis) ([place]) 1980s, recurrent 2015   Right DVT 2015  on xarelto  . Fibroid   . GERD (gastroesophageal reflux disease)   . Hypertension   . Hypertension   . Iron deficiency anemia due to chronic blood loss 01/23/2017  . 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 Kidney stone   . Lactose intolerance   . Meningioma ([place])    followed by Dr Christella Noa  . Pneumonia   . Spinal stenosis      Social History   Social History  . Marital status: Divorced    Spouse name: N/A  . Number of children: 4  . Years of education: college   Occupational History  . Retired    Social History Main Topics  . Smoking status: Former Smoker    Packs/day: 1.50    Years: 56.00    Types: Cigarettes    Quit date: 04/29/2012  . Smokeless tobacco: Never Used     Comment: quit smoking 3 years ago  . Alcohol use No     Comment: quit drinking beer 2005  . Drug use: No  . Sexual activity: Not on file   Other Topics Concern  . Not on file   Social History Narrative   Patient lives at home alone- divorced, no pets.     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 CHOLECYSTECTOMY    . FOOT SURGERY    . INCISION AND DRAINAGE FOOT    . TONSILLECTOMY AND ADENOIDECTOMY    . TREATMENT FISTULA ANAL    . TUBAL LIGATION      Family History  Problem Relation Age of Onset  . COPD Father        smoker deceased.   . Emphysema Father   . Lupus Sister   . Heart attack Son   . Heart disease Son   . Hypertension Son   . Hypertension Son     Allergies  Allergen Reactions  . Aspirin Other (See Comments)    Other reaction(s): PALPITATIONS Other reaction(s):  DIFFICULTY BREATHING Heart flutter  . Incruse Ellipta [Umeclidinium Bromide] Shortness Of Breath  . Tramadol Shortness Of Breath  . Amlodipine Other (See Comments)    Other reaction(s): SWELLING  . Codeine Other (See Comments)    Hallucinations, can take Tylenol #3 now  . Doxycycline Other (See Comments)    Other reaction(s): NAUSEA,VOMITING  . Gabapentin Other (See Comments)    Other reaction(s): Other (See Comments) She could not swallow the large pill  . Hydrocodone Other (See Comments)    hallucinations  . Losartan Other (See Comments)    unknown  . Propoxyphene Nausea Only  . Tiotropium Itching and Rash    Current Outpatient Prescriptions on File Prior to Visit  Medication Sig Dispense Refill  . acetaminophen-codeine (TYLENOL #3) 300-30 MG tablet Take 1 tablet by mouth every 6 (six) hours as needed for moderate pain. 30 tablet 1  . albuterol (PROVENTIL) (2.5 MG/3ML) 0.083% nebulizer solution Take 3 mLs (2.5 mg total) by nebulization every 6 (six) hours as needed for wheezing or shortness of breath. 375 mL 0  . apixaban (ELIQUIS) 2.5 MG TABS tablet Take 1 tablet (2.5 mg total) by mouth 2 (two) times daily. 60 tablet 3  . cholecalciferol (VITAMIN D) 1000 units tablet Take 1,000 Units by mouth daily.    . fluticasone furoate-vilanterol (BREO ELLIPTA) 200-25 MCG/INH AEPB Inhale 1 puff into the lungs daily. 2 each 0  . Multiple Vitamins-Minerals (MULTIVITAMIN & MINERAL PO) Take 1 tablet by mouth daily.    . Nutritional Supplements (ENSURE ENLIVE PO) Take 237 mLs by mouth 2 (two) times daily.    . OXYGEN Inhale 2 L into the lungs daily as needed. To maintain o2 saturation of 90%    . predniSONE (DELTASONE) 5 MG tablet Take 1 tablet (5 mg total) by mouth daily with breakfast. 30 tablet 1  . ranitidine (ZANTAC) 300 MG capsule Take 1 capsule (300 mg total) by mouth every evening. 30 capsule 11  . solifenacin (VESICARE) 5 MG tablet Take 5 mg by mouth.     No current  facility-administered medications on file prior to visit.     BP 138/60   Pulse 75   Temp 98.2 F (36.8 C) (Oral)   Resp 14   SpO2 90%       Objective:   Physical Exam  General- No acute distress. Pleasant patient. Neck- Full range of motion, no jvd Lungs- Clear, even and unlabored. Heart- regular rate and rhythm. Neurologic- CNII- XII grossly intact.  Back- no cva tenderness.   Lower ext- legs thin, symmetric, negative homans sign.  Abdomen-soft, nt, nd, +  bs, no rebound or guarding.        Assessment & Plan:  For your hx of low potassium and muscle cramps will get cmp and magnesium. Then determined if you need to get back on potassium.   If you need to get back on potassium will check with pharmacist your question on meds that can interact with potassium(ditropan).  For urinary frequency will get urine culture.  Regarding your copd history continue with treatment pulmonologist office prescribed. If you feel worse with breathing notify us and pulmonologist office.  Follow up date to be determined after lab review.  Josip Merolla, Percell Miller, PA-C

## 2017-06-10 NOTE — Patient Instructions (Addendum)
For your hx of low potassium and muscle cramps will get cmp and magnesium. Then determined if you need to get back on potassium.   If you need to get back on potassium will check with pharmacist your question on meds that can interact with potassium(ditropan).  For urinary frequency will get urine culture.  Regarding your copd history continue with treatment pulmonologist office prescribed. If you feel worse with breathing notify us and pulmonologist office.  Follow up date to be determined after lab review

## 2017-06-10 NOTE — Telephone Encounter (Signed)
Future CMP placed to check potassium level in 2 weeks

## 2017-06-11 ENCOUNTER — Telehealth: Payer: Self-pay

## 2017-06-11 DIAGNOSIS — E876 Hypokalemia: Secondary | ICD-10-CM

## 2017-06-11 LAB — URINE CULTURE
MICRO NUMBER: 81005976
RESULT: NO GROWTH
SPECIMEN QUALITY:: ADEQUATE

## 2017-06-11 NOTE — Telephone Encounter (Signed)
Pt aware of plan/she is scheduled for CMP on 9.28.18 for lab visit to recheck K with Dx: Hypokalemia/thx dmf

## 2017-06-11 NOTE — Telephone Encounter (Signed)
-----   Message from Mackie Pai, PA-C sent at 06/10/2017  9:51 PM EDT ----- Urine showed blood in urine. Urine culture sent out. Patient's metabolic panel shows very minimally decreased potassium at level of 3.4. Sodium was 1point elevated above upper limits. Try not to add any extra salt to her diet. With her potassium level almost completely normal and only mild low, I will prescribe her just one week of 10 mEq K Dur. Then advise patient to eat a banana every other day. Recommend we repeat metabolic panel in 2 weeks and make sure her potassium is within normal limits.

## 2017-06-12 ENCOUNTER — Telehealth: Payer: Self-pay | Admitting: Family Medicine

## 2017-06-12 NOTE — Telephone Encounter (Signed)
Called pharmacy states they have medication there and ready for patient to pick up. Patient notified.

## 2017-06-12 NOTE — Telephone Encounter (Signed)
Pt called in because she said that pharmacy never received her Rx for potassium chloride. She would like further assistance.

## 2017-06-15 DIAGNOSIS — I2510 Atherosclerotic heart disease of native coronary artery without angina pectoris: Secondary | ICD-10-CM | POA: Diagnosis not present

## 2017-06-15 DIAGNOSIS — M5137 Other intervertebral disc degeneration, lumbosacral region: Secondary | ICD-10-CM | POA: Diagnosis not present

## 2017-06-15 DIAGNOSIS — I10 Essential (primary) hypertension: Secondary | ICD-10-CM | POA: Diagnosis not present

## 2017-06-15 DIAGNOSIS — J9611 Chronic respiratory failure with hypoxia: Secondary | ICD-10-CM | POA: Diagnosis not present

## 2017-06-15 DIAGNOSIS — D509 Iron deficiency anemia, unspecified: Secondary | ICD-10-CM | POA: Diagnosis not present

## 2017-06-15 DIAGNOSIS — J9612 Chronic respiratory failure with hypercapnia: Secondary | ICD-10-CM | POA: Diagnosis not present

## 2017-06-15 DIAGNOSIS — D329 Benign neoplasm of meninges, unspecified: Secondary | ICD-10-CM | POA: Diagnosis not present

## 2017-06-15 DIAGNOSIS — E43 Unspecified severe protein-calorie malnutrition: Secondary | ICD-10-CM | POA: Diagnosis not present

## 2017-06-15 DIAGNOSIS — R1312 Dysphagia, oropharyngeal phase: Secondary | ICD-10-CM | POA: Diagnosis not present

## 2017-06-15 DIAGNOSIS — I700 Atherosclerosis of aorta: Secondary | ICD-10-CM | POA: Diagnosis not present

## 2017-06-15 DIAGNOSIS — I2609 Other pulmonary embolism with acute cor pulmonale: Secondary | ICD-10-CM | POA: Diagnosis not present

## 2017-06-15 DIAGNOSIS — J180 Bronchopneumonia, unspecified organism: Secondary | ICD-10-CM | POA: Diagnosis not present

## 2017-06-15 DIAGNOSIS — J439 Emphysema, unspecified: Secondary | ICD-10-CM | POA: Diagnosis not present

## 2017-06-19 ENCOUNTER — Other Ambulatory Visit: Payer: Self-pay | Admitting: *Deleted

## 2017-06-19 ENCOUNTER — Telehealth: Payer: Self-pay | Admitting: *Deleted

## 2017-06-19 ENCOUNTER — Ambulatory Visit (HOSPITAL_BASED_OUTPATIENT_CLINIC_OR_DEPARTMENT_OTHER)
Admission: RE | Admit: 2017-06-19 | Discharge: 2017-06-19 | Disposition: A | Discharge status: Home / Self Care | Payer: Medicare Other | Source: Ambulatory Visit | Attending: Hematology & Oncology | Admitting: Hematology & Oncology

## 2017-06-19 DIAGNOSIS — Z86718 Personal history of other venous thrombosis and embolism: Secondary | ICD-10-CM

## 2017-06-19 DIAGNOSIS — M79605 Pain in left leg: Secondary | ICD-10-CM | POA: Diagnosis not present

## 2017-06-19 DIAGNOSIS — M79604 Pain in right leg: Secondary | ICD-10-CM

## 2017-06-19 NOTE — Telephone Encounter (Signed)
Patient called stating she has LEft leg pain.  History of DVT.  Dr. Marin Olp notified.  Ordered Doppler ultrasound.  PAtient coming downstairs at imaging 1:30

## 2017-06-22 ENCOUNTER — Telehealth: Payer: Self-pay | Admitting: *Deleted

## 2017-06-22 NOTE — Telephone Encounter (Addendum)
Patient is aware of results  ----- Message from Volanda Napoleon, MD sent at 06/19/2017  3:11 PM EDT ----- Call - NO blood clot in either leg!!!!  Praise God!!!  pete

## 2017-06-23 ENCOUNTER — Other Ambulatory Visit: Payer: Self-pay | Admitting: Family

## 2017-06-23 DIAGNOSIS — D509 Iron deficiency anemia, unspecified: Secondary | ICD-10-CM | POA: Diagnosis not present

## 2017-06-23 DIAGNOSIS — I2609 Other pulmonary embolism with acute cor pulmonale: Secondary | ICD-10-CM | POA: Diagnosis not present

## 2017-06-23 DIAGNOSIS — J439 Emphysema, unspecified: Secondary | ICD-10-CM | POA: Diagnosis not present

## 2017-06-23 DIAGNOSIS — I2782 Chronic pulmonary embolism: Secondary | ICD-10-CM

## 2017-06-23 DIAGNOSIS — M5137 Other intervertebral disc degeneration, lumbosacral region: Secondary | ICD-10-CM | POA: Diagnosis not present

## 2017-06-23 DIAGNOSIS — J9611 Chronic respiratory failure with hypoxia: Secondary | ICD-10-CM | POA: Diagnosis not present

## 2017-06-23 DIAGNOSIS — D329 Benign neoplasm of meninges, unspecified: Secondary | ICD-10-CM | POA: Diagnosis not present

## 2017-06-23 DIAGNOSIS — R1312 Dysphagia, oropharyngeal phase: Secondary | ICD-10-CM | POA: Diagnosis not present

## 2017-06-23 DIAGNOSIS — Z86718 Personal history of other venous thrombosis and embolism: Secondary | ICD-10-CM

## 2017-06-23 DIAGNOSIS — I700 Atherosclerosis of aorta: Secondary | ICD-10-CM | POA: Diagnosis not present

## 2017-06-23 DIAGNOSIS — J9612 Chronic respiratory failure with hypercapnia: Secondary | ICD-10-CM | POA: Diagnosis not present

## 2017-06-23 DIAGNOSIS — I10 Essential (primary) hypertension: Secondary | ICD-10-CM | POA: Diagnosis not present

## 2017-06-23 DIAGNOSIS — E43 Unspecified severe protein-calorie malnutrition: Secondary | ICD-10-CM | POA: Diagnosis not present

## 2017-06-23 DIAGNOSIS — I2510 Atherosclerotic heart disease of native coronary artery without angina pectoris: Secondary | ICD-10-CM | POA: Diagnosis not present

## 2017-06-23 DIAGNOSIS — J180 Bronchopneumonia, unspecified organism: Secondary | ICD-10-CM | POA: Diagnosis not present

## 2017-06-23 MED ORDER — APIXABAN 2.5 MG PO TABS: 2.5000 mg | ORAL_TABLET | Freq: Two times a day (BID) | ORAL | 3 refills | Status: DC

## 2017-06-24 ENCOUNTER — Ambulatory Visit (INDEPENDENT_AMBULATORY_CARE_PROVIDER_SITE_OTHER): Payer: Medicare Other | Admitting: Family Medicine

## 2017-06-24 VITALS — BP 147/70 | HR 72 | Temp 98.6°F | Ht 65.5 in | Wt 93.2 lb

## 2017-06-24 DIAGNOSIS — M25561 Pain in right knee: Secondary | ICD-10-CM

## 2017-06-24 DIAGNOSIS — E876 Hypokalemia: Secondary | ICD-10-CM | POA: Diagnosis not present

## 2017-06-24 DIAGNOSIS — J449 Chronic obstructive pulmonary disease, unspecified: Secondary | ICD-10-CM

## 2017-06-24 DIAGNOSIS — I10 Essential (primary) hypertension: Secondary | ICD-10-CM

## 2017-06-24 DIAGNOSIS — R634 Abnormal weight loss: Secondary | ICD-10-CM

## 2017-06-24 DIAGNOSIS — Z9981 Dependence on supplemental oxygen: Secondary | ICD-10-CM

## 2017-06-24 DIAGNOSIS — R2689 Other abnormalities of gait and mobility: Secondary | ICD-10-CM | POA: Diagnosis not present

## 2017-06-24 MED ORDER — VERAPAMIL HCL ER 240 MG PO TBCR: 240.0000 mg | EXTENDED_RELEASE_TABLET | Freq: Every day | ORAL | 3 refills | Status: AC

## 2017-06-24 MED ORDER — ACETAMINOPHEN-CODEINE #4 300-60 MG PO TABS: 1.0000 | ORAL_TABLET | Freq: Four times a day (QID) | ORAL | 1 refills | Status: DC | PRN

## 2017-06-24 NOTE — Progress Notes (Signed)
[place] at [facility] [address], [place], [place] [address] [phone]  Date:  06/24/2017   Name:  Holly Page   DOB:  Feb 25, 1936   MRN:  [id number]  PCP:  Darreld Mclean, MD    Chief Complaint: Pain (Knee and Buttocks)   History of Present Illness:  Holly Page is a 81 y.o. very pleasant female patient who presents with the following:  History of end stage COPD, malnutrition, anxiety Here today with concern of her right knee hurting her a lot.   She notes that when she gets up in the morning her knee is very painful. It gets better as her morning goes on  Also her left hip hurts.   Never had any hip or knee films that she can recall  I saw her about one month ago-  She has been using tylenol #3, but notes that it does not last a full 6 hours,  She is afraid of taking too much tylenol. Wonders if we can give her any stronger medication for pain  She is taking verapamil 240 once a day- she tolerates this well, no issues.  Needs me to refill this for her today  Needs to recheck her K today- it was a bit low at last check as below    Chemistry      Component Value Date/Time   NA 146 (H) 06/10/2017 1001   NA 145 05/15/2017 0852   K 3.4 (L) 06/10/2017 1001   K 3.3 (L) 05/15/2017 0852   CL 94 (L) 06/10/2017 1001   CL 102 03/06/2017 1311   CO2 42 (H) 06/10/2017 1001   CO2 39 (H) 05/15/2017 0852   BUN 10 06/10/2017 1001   BUN 11.2 05/15/2017 0852   CREATININE 0.44 06/10/2017 1001   CREATININE 0.6 05/15/2017 0852   GLU 105 12/25/2016      Component Value Date/Time   CALCIUM 10.2 06/10/2017 1001   CALCIUM 9.9 05/15/2017 0852   ALKPHOS 59 06/10/2017 1001   ALKPHOS 61 05/15/2017 0852   AST 43 (H) 06/10/2017 1001   AST 48 (H) 05/15/2017 0852   ALT 19 06/10/2017 1001   ALT 22 05/15/2017 0852   BILITOT 0.5 06/10/2017 1001   BILITOT 0.48 05/15/2017 0852       Patient Active Problem List    Diagnosis Date Noted  . Iron deficiency anemia due to chronic blood loss 01/23/2017  . 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 Pulmonary embolus ([place]) 12/30/2016  . Severe sepsis ([place]) 12/26/2016  . Influenza B 12/26/2016  . Acute urinary retention 12/26/2016  . Oropharyngeal dysphagia 12/26/2016  . Metabolic acidosis 19/50/9326  . Polyneuropathy 12/26/2016  . Arthritis   . Clotting disorder ([place])   . CAD (coronary artery disease)   . Fibroid   . Hypertension   . Kidney stone   . Protein-calorie malnutrition, severe 07/23/2016  . Shortness of breath 07/23/2016  . SOB (shortness of breath) 07/21/2016  . Acute on chronic respiratory failure with hypoxia and hypercapnia ([place]) 07/21/2016  . Chronic respiratory failure ([place]) 04/10/2016  . COPD with exacerbation ([place]) 03/19/2016  . RBC microcytosis 03/19/2016  . COPD with acute exacerbation ([place]) 03/19/2016  . Oxygen dependent 12/31/2015  . Anxiety 11/08/2015  . Hyperthyroidism 09/07/2015  . Malnutrition of moderate degree 08/31/2015  . Abnormal LFTs 03/19/2015  . Back pain 03/19/2015  . HTN (hypertension) 03/01/2015  . Anemia 12/19/2014  . Calculus of kidney  08/30/2014  . Hematuria 08/11/2014  . History of DVT (deep vein thrombosis)   . Neuralgia neuritis, sciatic nerve 05/22/2014  . 1st degree AV block 04/24/2014  . History of colon polyps 04/24/2014  . Adaptive colitis 04/24/2014  . APC (atrial premature contractions) 04/24/2014  . Benign neoplasm of meninges ([place]) 02/10/2014  . Barrett esophagus 02/03/2014  . Spinal stenosis 12/15/2013  . Allergic rhinitis 11/21/2013  . COPD with emphysema Gold D   . GERD (gastroesophageal reflux disease)   . Lactose intolerance   . DDD (degenerative disc disease), lumbosacral 06/11/2010  . Diffuse cerebrovascular disease 06/11/2010    Past Medical History:  Diagnosis Date  . Anemia   . Arthritis   . CAD (coronary artery disease)   . Clotting disorder ([place])   . COPD (chronic  obstructive pulmonary disease) ([place])   . DVT (deep venous thrombosis) ([place]) 1980s, recurrent 2015   Right DVT 2015  on xarelto  . Fibroid   . GERD (gastroesophageal reflux disease)   . Hypertension   . Hypertension   . Iron deficiency anemia due to chronic blood loss 01/23/2017  . 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 Kidney stone   . Lactose intolerance   . Meningioma ([place])    followed by Dr Christella Noa  . Pneumonia   . Spinal stenosis     Past Surgical History:  Procedure Laterality Date  . CHOLECYSTECTOMY    . FOOT SURGERY    . INCISION AND DRAINAGE FOOT    . TONSILLECTOMY AND ADENOIDECTOMY    . TREATMENT FISTULA ANAL    . TUBAL LIGATION      Social History  Substance Use Topics  . Smoking status: Former Smoker    Packs/day: 1.50    Years: 56.00    Types: Cigarettes    Quit date: 04/29/2012  . Smokeless tobacco: Never Used     Comment: quit smoking 3 years ago  . Alcohol use No     Comment: quit drinking beer 2005    Family History  Problem Relation Age of Onset  . COPD Father        smoker deceased.   . Emphysema Father   . Lupus Sister   . Heart attack Son   . Heart disease Son   . Hypertension Son   . Hypertension Son     Allergies  Allergen Reactions  . Aspirin Other (See Comments)    Other reaction(s): PALPITATIONS Other reaction(s): DIFFICULTY BREATHING Heart flutter  . Incruse Ellipta [Umeclidinium Bromide] Shortness Of Breath  . Tramadol Shortness Of Breath  . Amlodipine Other (See Comments)    Other reaction(s): SWELLING  . Codeine Other (See Comments)    Hallucinations, can take Tylenol #3 now  . Doxycycline Other (See Comments)    Other reaction(s): NAUSEA,VOMITING  . Gabapentin Other (See Comments)    Other reaction(s): Other (See Comments) She could not swallow the large pill  . Hydrocodone Other (See Comments)    hallucinations  . Losartan Other (See Comments)    unknown  . Propoxyphene Nausea Only  . Tiotropium Itching and Rash     Medication list has been reviewed and updated.  Current Outpatient Prescriptions on File Prior to Visit  Medication Sig Dispense Refill  . acetaminophen-codeine (TYLENOL #3) 300-30 MG tablet Take 1 tablet by mouth every 6 (six) hours as needed for moderate pain. 30 tablet 1  . albuterol (PROVENTIL) (2.5 MG/3ML) 0.083% nebulizer solution Take 3 mLs (2.5 mg total) by nebulization every 6 (six)  hours as needed for wheezing or shortness of breath. 375 mL 0  . apixaban (ELIQUIS) 2.5 MG TABS tablet Take 1 tablet (2.5 mg total) by mouth 2 (two) times daily. 60 tablet 3  . cholecalciferol (VITAMIN D) 1000 units tablet Take 1,000 Units by mouth daily.    . fluticasone furoate-vilanterol (BREO ELLIPTA) 200-25 MCG/INH AEPB Inhale 1 puff into the lungs daily. 2 each 0  . Multiple Vitamins-Minerals (MULTIVITAMIN & MINERAL PO) Take 1 tablet by mouth daily.    . Nutritional Supplements (ENSURE ENLIVE PO) Take 237 mLs by mouth 2 (two) times daily.    . OXYGEN Inhale 2 L into the lungs daily as needed. To maintain o2 saturation of 90%    . potassium chloride (K-DUR) 10 MEQ tablet Take 1 tablet (10 mEq total) by mouth daily. 7 tablet 0  . predniSONE (DELTASONE) 5 MG tablet Take 1 tablet (5 mg total) by mouth daily with breakfast. 30 tablet 1  . PROAIR HFA 108 (90 Base) MCG/ACT inhaler     . ranitidine (ZANTAC) 300 MG capsule Take 1 capsule (300 mg total) by mouth every evening. 30 capsule 11  . solifenacin (VESICARE) 5 MG tablet Take 5 mg by mouth.     No current facility-administered medications on file prior to visit.     Review of Systems:  As per HPI- otherwise negative. No fever or chills Her SOB is at her usual baseline No nausea, vomiting or diarrhea   Physical Examination: Vitals:   06/24/17 1435 06/24/17 1436  BP: (!) 165/69 (!) 147/70  Pulse: 72   Temp: 98.6 F (37 C)   SpO2: 96%    Vitals:   06/24/17 1435  Weight: 93 lb 3.2 oz (42.3 kg)  Height: 5' 5.5" (1.664 m)   Body mass  index is 15.27 kg/m. Ideal Body Weight: Weight in (lb) to have BMI = 25: 152.2  GEN: WDWN, NAD, Non-toxic, A & O x 3, cachetic, in WC, using oxygen via [place] HEENT: Atraumatic, Normocephalic. Neck supple. No masses, No LAD. Ears and Nose: No external deformity. CV: RRR, No M/G/R. No JVD. No thrill. No extra heart sounds. PULM: CTA B, no wheezes, crackles, rhonchi. No retractions. No resp. distress. No accessory muscle use. EXTR: No c/c/e NEURO gait not tested, in [facility] PSYCH: Normally interactive. Conversant. Not depressed or anxious appearing.  Calm demeanor. Right knee- normal ROM, no crepitus, swelling or heat.  Pt states the knee is only painful in the mornings   Left hip is not painful to ROM  Assessment and Plan: Arthralgia of right knee - Plan: acetaminophen-codeine (TYLENOL #4) 300-60 MG tablet  Essential hypertension - Plan: verapamil (CALAN-SR) 240 MG CR tablet  End stage COPD (HCC)  Oxygen dependent  Balance problem  Loss of weight  Hypokalemia - Plan: Basic metabolic panel  Here today to evaluate knee and hip pain. She likely has OA, but declines x-rays today She has been intolerant to many pain meds but can tolerate codeine.  She is worried about increasing her dose of tylenol She generally takes about 1 tylenol 3 a day Gave her an rx for tylenol 4, which has 60 mg of codeine instead of 30 - she can take a 1/2 pill and then take the other 1/2 a couple of hours later as needed for joint pain.  Can take up to a max of 4 pills per 24 hours  Will plan further follow- up pending labs.  Signed Lamar Blinks, MD

## 2017-06-24 NOTE — Patient Instructions (Signed)
It was good to see you today!  I refilled your verapamil 240 mg, 1 pill daily I gave you an rx for "tylneol #4)- this has more codeine for pain relief,but not more tylenol Try 1/2 or 1 tablet every 6 hours as needed for pain

## 2017-06-25 ENCOUNTER — Encounter: Payer: Self-pay | Admitting: Family Medicine

## 2017-06-25 LAB — BASIC METABOLIC PANEL
BUN: 11 mg/dL (ref 6–23)
CHLORIDE: 96 meq/L (ref 96–112)
CO2: 42 meq/L — AB (ref 19–32)
CREATININE: 0.47 mg/dL (ref 0.40–1.20)
Calcium: 10 mg/dL (ref 8.4–10.5)
GFR: 164.37 mL/min (ref >60.00)
Glucose, Bld: 83 mg/dL (ref 70–99)
Potassium: 3.9 mEq/L (ref 3.5–5.1)
Sodium: 143 mEq/L (ref 135–145)

## 2017-06-26 ENCOUNTER — Other Ambulatory Visit: Payer: Medicare Other

## 2017-06-26 DIAGNOSIS — J449 Chronic obstructive pulmonary disease, unspecified: Secondary | ICD-10-CM | POA: Diagnosis not present

## 2017-06-30 DIAGNOSIS — I700 Atherosclerosis of aorta: Secondary | ICD-10-CM | POA: Diagnosis not present

## 2017-06-30 DIAGNOSIS — D329 Benign neoplasm of meninges, unspecified: Secondary | ICD-10-CM | POA: Diagnosis not present

## 2017-06-30 DIAGNOSIS — J9611 Chronic respiratory failure with hypoxia: Secondary | ICD-10-CM | POA: Diagnosis not present

## 2017-06-30 DIAGNOSIS — I10 Essential (primary) hypertension: Secondary | ICD-10-CM | POA: Diagnosis not present

## 2017-06-30 DIAGNOSIS — M5137 Other intervertebral disc degeneration, lumbosacral region: Secondary | ICD-10-CM | POA: Diagnosis not present

## 2017-06-30 DIAGNOSIS — E43 Unspecified severe protein-calorie malnutrition: Secondary | ICD-10-CM | POA: Diagnosis not present

## 2017-06-30 DIAGNOSIS — D509 Iron deficiency anemia, unspecified: Secondary | ICD-10-CM | POA: Diagnosis not present

## 2017-06-30 DIAGNOSIS — R1312 Dysphagia, oropharyngeal phase: Secondary | ICD-10-CM | POA: Diagnosis not present

## 2017-06-30 DIAGNOSIS — J439 Emphysema, unspecified: Secondary | ICD-10-CM | POA: Diagnosis not present

## 2017-06-30 DIAGNOSIS — I2609 Other pulmonary embolism with acute cor pulmonale: Secondary | ICD-10-CM | POA: Diagnosis not present

## 2017-06-30 DIAGNOSIS — I2510 Atherosclerotic heart disease of native coronary artery without angina pectoris: Secondary | ICD-10-CM | POA: Diagnosis not present

## 2017-06-30 DIAGNOSIS — J9612 Chronic respiratory failure with hypercapnia: Secondary | ICD-10-CM | POA: Diagnosis not present

## 2017-06-30 DIAGNOSIS — J180 Bronchopneumonia, unspecified organism: Secondary | ICD-10-CM | POA: Diagnosis not present

## 2017-07-01 ENCOUNTER — Telehealth: Payer: Self-pay | Admitting: Family Medicine

## 2017-07-01 NOTE — Telephone Encounter (Signed)
Pt called for lab results 9/26. Please call pt.

## 2017-07-01 NOTE — Telephone Encounter (Signed)
After speaking to Dr. Lorelei Pont letter has been sent out to patient. Patient advised.

## 2017-07-07 DIAGNOSIS — I700 Atherosclerosis of aorta: Secondary | ICD-10-CM | POA: Diagnosis not present

## 2017-07-07 DIAGNOSIS — I2609 Other pulmonary embolism with acute cor pulmonale: Secondary | ICD-10-CM | POA: Diagnosis not present

## 2017-07-07 DIAGNOSIS — R1312 Dysphagia, oropharyngeal phase: Secondary | ICD-10-CM | POA: Diagnosis not present

## 2017-07-07 DIAGNOSIS — D329 Benign neoplasm of meninges, unspecified: Secondary | ICD-10-CM | POA: Diagnosis not present

## 2017-07-07 DIAGNOSIS — D509 Iron deficiency anemia, unspecified: Secondary | ICD-10-CM | POA: Diagnosis not present

## 2017-07-07 DIAGNOSIS — I2510 Atherosclerotic heart disease of native coronary artery without angina pectoris: Secondary | ICD-10-CM | POA: Diagnosis not present

## 2017-07-07 DIAGNOSIS — J9611 Chronic respiratory failure with hypoxia: Secondary | ICD-10-CM | POA: Diagnosis not present

## 2017-07-07 DIAGNOSIS — I10 Essential (primary) hypertension: Secondary | ICD-10-CM | POA: Diagnosis not present

## 2017-07-07 DIAGNOSIS — J439 Emphysema, unspecified: Secondary | ICD-10-CM | POA: Diagnosis not present

## 2017-07-07 DIAGNOSIS — E43 Unspecified severe protein-calorie malnutrition: Secondary | ICD-10-CM | POA: Diagnosis not present

## 2017-07-07 DIAGNOSIS — M5137 Other intervertebral disc degeneration, lumbosacral region: Secondary | ICD-10-CM | POA: Diagnosis not present

## 2017-07-07 DIAGNOSIS — J180 Bronchopneumonia, unspecified organism: Secondary | ICD-10-CM | POA: Diagnosis not present

## 2017-07-07 DIAGNOSIS — J9612 Chronic respiratory failure with hypercapnia: Secondary | ICD-10-CM | POA: Diagnosis not present

## 2017-07-15 DIAGNOSIS — R399 Unspecified symptoms and signs involving the genitourinary system: Secondary | ICD-10-CM | POA: Diagnosis not present

## 2017-07-16 DIAGNOSIS — I700 Atherosclerosis of aorta: Secondary | ICD-10-CM | POA: Diagnosis not present

## 2017-07-16 DIAGNOSIS — M5137 Other intervertebral disc degeneration, lumbosacral region: Secondary | ICD-10-CM | POA: Diagnosis not present

## 2017-07-16 DIAGNOSIS — E43 Unspecified severe protein-calorie malnutrition: Secondary | ICD-10-CM | POA: Diagnosis not present

## 2017-07-16 DIAGNOSIS — I10 Essential (primary) hypertension: Secondary | ICD-10-CM | POA: Diagnosis not present

## 2017-07-16 DIAGNOSIS — J180 Bronchopneumonia, unspecified organism: Secondary | ICD-10-CM | POA: Diagnosis not present

## 2017-07-16 DIAGNOSIS — D509 Iron deficiency anemia, unspecified: Secondary | ICD-10-CM | POA: Diagnosis not present

## 2017-07-16 DIAGNOSIS — J9611 Chronic respiratory failure with hypoxia: Secondary | ICD-10-CM | POA: Diagnosis not present

## 2017-07-16 DIAGNOSIS — I2510 Atherosclerotic heart disease of native coronary artery without angina pectoris: Secondary | ICD-10-CM | POA: Diagnosis not present

## 2017-07-16 DIAGNOSIS — D329 Benign neoplasm of meninges, unspecified: Secondary | ICD-10-CM | POA: Diagnosis not present

## 2017-07-16 DIAGNOSIS — R1312 Dysphagia, oropharyngeal phase: Secondary | ICD-10-CM | POA: Diagnosis not present

## 2017-07-16 DIAGNOSIS — J9612 Chronic respiratory failure with hypercapnia: Secondary | ICD-10-CM | POA: Diagnosis not present

## 2017-07-16 DIAGNOSIS — I2609 Other pulmonary embolism with acute cor pulmonale: Secondary | ICD-10-CM | POA: Diagnosis not present

## 2017-07-16 DIAGNOSIS — J439 Emphysema, unspecified: Secondary | ICD-10-CM | POA: Diagnosis not present

## 2017-07-21 NOTE — Progress Notes (Signed)
[place] at [facility] [address] [place], [place], [place] [address] [phone]  Date:  07/23/2017   Name:  Holly Page   DOB:  02-09-1936   MRN:  [id number]  PCP:  Holly Mclean, MD    Chief Complaint: Follow-up (Pt here for f/u visit. )   History of Present Illness:  Holly Page is a 81 y.o. very pleasant female patient who presents with the following:  History of end stage COPD, protein calorie malnutrition, chronic oxygen use, HTN  Today she notes that her stomach "hurts," she has seen GI "they said there was something in there but they are not sure what it is"  She did see GI about a year ago.  Her primary GI doctor, Dr. Dorrene Page, did an MRI last year which showed dilation of the bile duct- they recommended that she been seen at a tertiary care center for discussion of further evaluation-possible malignancy.  She was referred to see Dr. Newman Page at [facility]- however, they decided not to pursue further evaluation due to her severe COPD and inability to tolerate sedation  She notes that her stomach has hurt over the last year.  "hurting, just a hurting"- she cannot really describe this in further detail She does not feel that eating makes it better or worse.  She notes that it hurts "even when I'm asleep."  She does not have any vomiting, but can feel nauseated at times   She has noted pain, all day and every day, for over a year per her report   BP Readings from Last 3 Encounters:  07/23/17 122/62  07/23/17 116/71  06/24/17 (!) 147/70   Flu shot: she declines to have this today Mammo: defer due to end stage COPD dexa scan: defer due to end stage COP  I put her on tylenol #4 last time as she complained that the #3 was not strong enough for her back pain.  However she today complains that the #4 was too strong and make her too sleepy.  She has stated allergies to tramadol, hydrocodone.  Asked her to try cutting her  tylenol 4 in half to decrease the strength and she will try this.   Accompanied by her grandson today but he does not say anything during visit or appear to take any interest  Holly Page as per her baseline does not seem to grasp that she has a terminal illness.  She is frustrated that I am not able to cure her long standing abdominal pain and make her feel well again today  Patient Active Problem List   Diagnosis Date Noted  . Iron deficiency anemia due to chronic blood loss 01/23/2017  . 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 Pulmonary embolus ([place]) 12/30/2016  . Severe sepsis ([place]) 12/26/2016  . Influenza B 12/26/2016  . Acute urinary retention 12/26/2016  . Oropharyngeal dysphagia 12/26/2016  . Metabolic acidosis 96/22/2979  . Polyneuropathy 12/26/2016  . Arthritis   . Clotting disorder ([place])   . CAD (coronary artery disease)   . Fibroid   . Hypertension   . Kidney stone   . Protein-calorie malnutrition, severe 07/23/2016  . Shortness of breath 07/23/2016  . SOB (shortness of breath) 07/21/2016  . Acute on chronic respiratory failure with hypoxia and hypercapnia ([place]) 07/21/2016  . Chronic respiratory failure ([place]) 04/10/2016  . COPD with exacerbation ([place]) 03/19/2016  . RBC microcytosis 03/19/2016  . COPD with acute exacerbation ([place]) 03/19/2016  .  Oxygen dependent 12/31/2015  . Anxiety 11/08/2015  . Hyperthyroidism 09/07/2015  . Malnutrition of moderate degree 08/31/2015  . Abnormal LFTs 03/19/2015  . Back pain 03/19/2015  . HTN (hypertension) 03/01/2015  . Anemia 12/19/2014  . Calculus of kidney 08/30/2014  . Hematuria 08/11/2014  . History of DVT (deep vein thrombosis)   . Neuralgia neuritis, sciatic nerve 05/22/2014  . 1st degree AV block 04/24/2014  . History of colon polyps 04/24/2014  . Adaptive colitis 04/24/2014  . APC (atrial premature contractions) 04/24/2014  . Benign neoplasm of meninges ([place]) 02/10/2014  . Barrett esophagus 02/03/2014  . Spinal stenosis  12/15/2013  . Allergic rhinitis 11/21/2013  . COPD with emphysema Gold D   . GERD (gastroesophageal reflux disease)   . Lactose intolerance   . DDD (degenerative disc disease), lumbosacral 06/11/2010  . Diffuse cerebrovascular disease 06/11/2010    Past Medical History:  Diagnosis Date  . Anemia   . Arthritis   . CAD (coronary artery disease)   . Clotting disorder ([place])   . COPD (chronic obstructive pulmonary disease) ([place])   . DVT (deep venous thrombosis) ([place]) 1980s, recurrent 2015   Right DVT 2015  on xarelto  . Fibroid   . GERD (gastroesophageal reflux disease)   . Hypertension   . Hypertension   . Iron deficiency anemia due to chronic blood loss 01/23/2017  . 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 Kidney stone   . Lactose intolerance   . Meningioma ([place])    followed by Dr Christella Noa  . Pneumonia   . Spinal stenosis     Past Surgical History:  Procedure Laterality Date  . CHOLECYSTECTOMY    . FOOT SURGERY    . INCISION AND DRAINAGE FOOT    . TONSILLECTOMY AND ADENOIDECTOMY    . TREATMENT FISTULA ANAL    . TUBAL LIGATION      Social History  Substance Use Topics  . Smoking status: Former Smoker    Packs/day: 1.50    Years: 56.00    Types: Cigarettes    Quit date: 04/29/2012  . Smokeless tobacco: Never Used     Comment: quit smoking 3 years ago  . Alcohol use No     Comment: quit drinking beer 2005    Family History  Problem Relation Age of Onset  . COPD Father        smoker deceased.   . Emphysema Father   . Lupus Sister   . Heart attack Son   . Heart disease Son   . Hypertension Son   . Hypertension Son     Allergies  Allergen Reactions  . Aspirin Other (See Comments)    Other reaction(s): PALPITATIONS Other reaction(s): DIFFICULTY BREATHING Heart flutter  . Incruse Ellipta [Umeclidinium Bromide] Shortness Of Breath  . Tramadol Shortness Of Breath  . Amlodipine Other (See Comments)    Other reaction(s): SWELLING  . Codeine Other (See Comments)     Hallucinations, can take Tylenol #3 now  . Doxycycline Other (See Comments)    Other reaction(s): NAUSEA,VOMITING  . Gabapentin Other (See Comments)    Other reaction(s): Other (See Comments) She could not swallow the large pill  . Hydrocodone Other (See Comments)    hallucinations  . Losartan Other (See Comments)    unknown  . Propoxyphene Nausea Only  . Tiotropium Itching and Rash    Medication list has been reviewed and updated.  Current Outpatient Prescriptions on File Prior to Visit  Medication Sig Dispense Refill  . acetaminophen-codeine (  TYLENOL #4) 300-60 MG tablet Take 1 tablet by mouth every 6 (six) hours as needed for moderate pain. 30 tablet 1  . albuterol (PROVENTIL) (2.5 MG/3ML) 0.083% nebulizer solution Take 3 mLs (2.5 mg total) by nebulization every 6 (six) hours as needed for wheezing or shortness of breath. 375 mL 0  . apixaban (ELIQUIS) 2.5 MG TABS tablet Take 1 tablet (2.5 mg total) by mouth 2 (two) times daily. 60 tablet 3  . cholecalciferol (VITAMIN D) 1000 units tablet Take 1,000 Units by mouth daily.    . fluticasone furoate-vilanterol (BREO ELLIPTA) 200-25 MCG/INH AEPB Inhale 1 puff into the lungs daily. 2 each 0  . Multiple Vitamins-Minerals (MULTIVITAMIN & MINERAL PO) Take 1 tablet by mouth daily.    . Nutritional Supplements (ENSURE ENLIVE PO) Take 237 mLs by mouth 2 (two) times daily.    . OXYGEN Inhale 2 L into the lungs daily as needed. To maintain o2 saturation of 90%    . potassium chloride (K-DUR) 10 MEQ tablet Take 1 tablet (10 mEq total) by mouth daily. 7 tablet 0  . predniSONE (DELTASONE) 5 MG tablet Take 1 tablet (5 mg total) by mouth daily with breakfast. 30 tablet 1  . PROAIR HFA 108 (90 Base) MCG/ACT inhaler     . ranitidine (ZANTAC) 300 MG capsule Take 1 capsule (300 mg total) by mouth every evening. 30 capsule 11  . solifenacin (VESICARE) 5 MG tablet Take 5 mg by mouth.    . verapamil (CALAN-SR) 240 MG CR tablet Take 1 tablet (240 mg total) by  mouth at bedtime. 90 tablet 3   No current facility-administered medications on file prior to visit.     Review of Systems:  As per HPI- otherwise negative. No fever Breathing is at baseline today   Physical Examination: Vitals:   07/23/17 1027  BP: 122/62  Pulse: 74  Temp: 98.9 F (37.2 C)   Vitals:   There is no height or weight on file to calculate BMI. Ideal Body Weight:     We had a hard time getting sat meter to read today.  Her sat at pulmonology earlier today was 92%  GEN: cachectic, NAD, Non-toxic, A & O x 3 HEENT: Atraumatic, Normocephalic. Neck supple. No masses, No LAD. Ears and Nose: No external deformity. CV: RRR, No M/G/R. No JVD. No thrill. No extra heart sounds. PULM:  No wheezes heard today.  Depending on oxygen ABD: S, NT, ND, +BS. No rebound. No HSM.  Pt indicates her entire abdomen as the area that can be tender when she has her pain  EXTR: No c/c/e NEURO sitting in WC today PSYCH: Normally interactive. Conversant. Not depressed or anxious appearing.  Calm demeanor.   Wt Readings from Last 3 Encounters:  06/24/17 93 lb 3.2 oz (42.3 kg)  06/04/17 91 lb (41.3 kg)  05/15/17 89 lb 12 oz (40.7 kg)    Assessment and Plan: End stage COPD (HCC)  Oxygen dependent  Chronic abdominal pain  Frailty  Moderate protein-calorie malnutrition (HCC)  End stage COPD on oxygen. Message to Tammy, her main provider at pulmonology.  She agrees that  Giannah's life expectancy is very limited.  They also have not been able to encourage her to stop seeking in-depth evaluation for symptoms, and she has not been open to palliative care.  I do wish that she could fine peace and acceptance of her disease and concentrate on her quality of life  She is not happy with the fact that  I cannot give her a reason for and a cure for her abdominal pain today.  Will send her back to her primary GI doctor to follow-up with her and perhaps explain this better.   Signed Lamar Blinks, MD

## 2017-07-22 DIAGNOSIS — I10 Essential (primary) hypertension: Secondary | ICD-10-CM | POA: Diagnosis not present

## 2017-07-22 DIAGNOSIS — M79652 Pain in left thigh: Secondary | ICD-10-CM | POA: Diagnosis not present

## 2017-07-22 DIAGNOSIS — Z79899 Other long term (current) drug therapy: Secondary | ICD-10-CM | POA: Diagnosis not present

## 2017-07-22 DIAGNOSIS — Z87891 Personal history of nicotine dependence: Secondary | ICD-10-CM | POA: Diagnosis not present

## 2017-07-22 DIAGNOSIS — Z7901 Long term (current) use of anticoagulants: Secondary | ICD-10-CM | POA: Diagnosis not present

## 2017-07-22 DIAGNOSIS — Y998 Other external cause status: Secondary | ICD-10-CM | POA: Diagnosis not present

## 2017-07-22 DIAGNOSIS — S4992XA Unspecified injury of left shoulder and upper arm, initial encounter: Secondary | ICD-10-CM | POA: Diagnosis not present

## 2017-07-22 DIAGNOSIS — W01198A Fall on same level from slipping, tripping and stumbling with subsequent striking against other object, initial encounter: Secondary | ICD-10-CM | POA: Diagnosis not present

## 2017-07-22 DIAGNOSIS — M79622 Pain in left upper arm: Secondary | ICD-10-CM | POA: Diagnosis not present

## 2017-07-22 DIAGNOSIS — M79602 Pain in left arm: Secondary | ICD-10-CM | POA: Diagnosis not present

## 2017-07-22 DIAGNOSIS — S0990XA Unspecified injury of head, initial encounter: Secondary | ICD-10-CM | POA: Diagnosis not present

## 2017-07-22 DIAGNOSIS — M7912 Myalgia of auxiliary muscles, head and neck: Secondary | ICD-10-CM | POA: Diagnosis not present

## 2017-07-22 DIAGNOSIS — S79922A Unspecified injury of left thigh, initial encounter: Secondary | ICD-10-CM | POA: Diagnosis not present

## 2017-07-22 DIAGNOSIS — G4459 Other complicated headache syndrome: Secondary | ICD-10-CM | POA: Diagnosis not present

## 2017-07-22 DIAGNOSIS — M79662 Pain in left lower leg: Secondary | ICD-10-CM | POA: Diagnosis not present

## 2017-07-22 DIAGNOSIS — M25552 Pain in left hip: Secondary | ICD-10-CM | POA: Diagnosis not present

## 2017-07-23 ENCOUNTER — Ambulatory Visit (INDEPENDENT_AMBULATORY_CARE_PROVIDER_SITE_OTHER): Payer: Medicare Other | Admitting: Adult Health

## 2017-07-23 ENCOUNTER — Encounter: Payer: Self-pay | Admitting: Adult Health

## 2017-07-23 ENCOUNTER — Ambulatory Visit (INDEPENDENT_AMBULATORY_CARE_PROVIDER_SITE_OTHER): Payer: Medicare Other | Admitting: Family Medicine

## 2017-07-23 VITALS — BP 122/62 | HR 74 | Temp 98.9°F

## 2017-07-23 DIAGNOSIS — J449 Chronic obstructive pulmonary disease, unspecified: Secondary | ICD-10-CM

## 2017-07-23 DIAGNOSIS — R109 Unspecified abdominal pain: Secondary | ICD-10-CM

## 2017-07-23 DIAGNOSIS — J9611 Chronic respiratory failure with hypoxia: Secondary | ICD-10-CM | POA: Diagnosis not present

## 2017-07-23 DIAGNOSIS — R54 Age-related physical debility: Secondary | ICD-10-CM | POA: Diagnosis not present

## 2017-07-23 DIAGNOSIS — I2782 Chronic pulmonary embolism: Secondary | ICD-10-CM | POA: Diagnosis not present

## 2017-07-23 DIAGNOSIS — G8929 Other chronic pain: Secondary | ICD-10-CM

## 2017-07-23 DIAGNOSIS — E440 Moderate protein-calorie malnutrition: Secondary | ICD-10-CM | POA: Diagnosis not present

## 2017-07-23 DIAGNOSIS — J439 Emphysema, unspecified: Secondary | ICD-10-CM

## 2017-07-23 DIAGNOSIS — Z9981 Dependence on supplemental oxygen: Secondary | ICD-10-CM | POA: Diagnosis not present

## 2017-07-23 NOTE — Patient Instructions (Signed)
I will set you up with your Gastroenterology doctor for a follow-up visit Take care

## 2017-07-23 NOTE — Assessment & Plan Note (Signed)
Cont on O2 .  

## 2017-07-23 NOTE — Assessment & Plan Note (Signed)
Stable without flare   Plan  Patient Instructions  Continue on BREO 1 puff daily , rinse after use. (Try to take everyday) Continue on prednisone 5mg  daily.  Continue on Albuterol Neb As needed   Continue on Oxygen 2l/m .  Ensure Twice daily  Between meals.  Follow up in 6-8 weeks  as planned and As needed    Please contact office for sooner follow up if symptoms do not improve or worsen or seek emergency care

## 2017-07-23 NOTE — Assessment & Plan Note (Signed)
Hx of PE/DVT  Cont on Eliquis .

## 2017-07-23 NOTE — Progress Notes (Signed)
@Patient  ID: Holly Page, female    DOB: [birth year], 81 y.o.   MRN: [id number]  No chief complaint on file.   Referring provider: Copland, Gay Filler, MD  HPI: 81y.o. F with Gold D Copd primary emphysema on O2 >GOLD card patient  Hx of DVT (2015) , superficial Vein thrombus 2016 , on xarelto , followed by Hematology  PE 11/2016 (with Influenza and PNA) -life long anticoagulation  TEST /Events  Hx of DVT -05/2014 (tx w/ xarelto until 12/2014 ) -followed by hematology  Left arm superficial venous thrombus , Xarelto 01/25/15 >followed by Hematology >stopped 07/2015  CT chest, on March 19 2015 >that showed no evidence of pulmonary embolism and stable. Severe COPD changes. Spirometry showed FEV1 at 30% in 2015.  Intolerant to [place] ,[place] [employer] and tudorza , Psychologist, forensic  Oxygen does drop with pulsing O2 on 2 L was able to keep above 90% on continuous flow  Cardiology eval HP per pt with a negative stress test recently.  Pulmonary rehab 2015 at [facility]   07/23/2017 Follow up ; COPD , O2 RF and Hx of PE/DVT  Pt returns for 6 week follow up . She has known severe COPD that is oxygen dependent on 2l/m . Says since last visit , breathing is stable and at baseline without flare of cough or dyspnea. She is very sedentary and gets winded with minimal activity . Last visit she was recovering from recent COPD flare and was on tapering prednisone dose. She was instructed to taper to prednisone 5mg  daily. She says she did this but did not take for last couple of days. We discussed using low dose prednisone to help with her frequent exacerbations. She remains on BREO daily.  Uses Oxygen 2l/m .  Declines flu shot .   Seen in ER yesterday, after fall at home . Report no fractures, just bruised on legs.  She is some better today . Still sore .    Allergies  Allergen Reactions  . Aspirin Other (See Comments)    Other reaction(s): PALPITATIONS Other reaction(s): DIFFICULTY  BREATHING Heart flutter  . Incruse Ellipta [Umeclidinium Bromide] Shortness Of Breath  . Tramadol Shortness Of Breath  . Amlodipine Other (See Comments)    Other reaction(s): SWELLING  . Codeine Other (See Comments)    Hallucinations, can take Tylenol #3 now  . Doxycycline Other (See Comments)    Other reaction(s): NAUSEA,VOMITING  . Gabapentin Other (See Comments)    Other reaction(s): Other (See Comments) She could not swallow the large pill  . Hydrocodone Other (See Comments)    hallucinations  . Losartan Other (See Comments)    unknown  . Propoxyphene Nausea Only  . Tiotropium Itching and Rash    Immunization History  Administered Date(s) Administered  . Pneumococcal Conjugate-13 08/09/2015  . Pneumococcal Polysaccharide-23 09/29/2001, 09/29/2008, 04/26/2012  . Td 03/28/2009  . Tdap 04/04/2008    Past Medical History:  Diagnosis Date  . Anemia   . Arthritis   . CAD (coronary artery disease)   . Clotting disorder ([place])   . COPD (chronic obstructive pulmonary disease) ([place])   . DVT (deep venous thrombosis) ([place]) 1980s, recurrent 2015   Right DVT 2015  on xarelto  . Fibroid   . GERD (gastroesophageal reflux disease)   . Hypertension   . Hypertension   . Iron deficiency anemia due to chronic blood loss 01/23/2017  . 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 Kidney stone   . Lactose intolerance   .  Meningioma ([place])    followed by Dr Christella Noa  . Pneumonia   . Spinal stenosis     Tobacco History: History  Smoking Status  . Former Smoker  . Packs/day: 1.50  . Years: 56.00  . Types: Cigarettes  . Quit date: 04/29/2012  Smokeless Tobacco  . Never Used    Comment: quit smoking 3 years ago   Counseling given: Not Answered   Outpatient Encounter Prescriptions as of 07/23/2017  Medication Sig  . acetaminophen-codeine (TYLENOL #4) 300-60 MG tablet Take 1 tablet by mouth every 6 (six) hours as needed for moderate pain.  [name] albuterol (PROVENTIL) (2.5 MG/3ML) 0.083% nebulizer  solution Take 3 mLs (2.5 mg total) by nebulization every 6 (six) hours as needed for wheezing or shortness of breath.  [name] apixaban (ELIQUIS) 2.5 MG TABS tablet Take 1 tablet (2.5 mg total) by mouth 2 (two) times daily.  . cholecalciferol (VITAMIN D) 1000 units tablet Take 1,000 Units by mouth daily.  . fluticasone furoate-vilanterol (BREO ELLIPTA) 200-25 MCG/INH AEPB Inhale 1 puff into the lungs daily.  . Multiple Vitamins-Minerals (MULTIVITAMIN & MINERAL PO) Take 1 tablet by mouth daily.  . Nutritional Supplements (ENSURE ENLIVE PO) Take 237 mLs by mouth 2 (two) times daily.  . OXYGEN Inhale 2 L into the lungs daily as needed. To maintain o2 saturation of 90%  . potassium chloride (K-DUR) 10 MEQ tablet Take 1 tablet (10 mEq total) by mouth daily.  . predniSONE (DELTASONE) 5 MG tablet Take 1 tablet (5 mg total) by mouth daily with breakfast.  . PROAIR HFA 108 (90 Base) MCG/ACT inhaler   . ranitidine (ZANTAC) 300 MG capsule Take 1 capsule (300 mg total) by mouth every evening.  . solifenacin (VESICARE) 5 MG tablet Take 5 mg by mouth.  . verapamil (CALAN-SR) 240 MG CR tablet Take 1 tablet (240 mg total) by mouth at bedtime.   No facility-administered encounter medications on file as of 07/23/2017.      Review of Systems  Constitutional:   No  weight loss, night sweats,  Fevers, chills,  +fatigue, or  lassitude.  HEENT:   No headaches,  Difficulty swallowing,  Tooth/dental problems, or  Sore throat,                No sneezing, itching, ear ache, nasal congestion, post nasal drip,   CV:  No chest pain,  Orthopnea, PND, swelling in lower extremities, anasarca, dizziness, palpitations, syncope.   GI  No heartburn, indigestion, abdominal pain, nausea, vomiting, diarrhea, change in bowel habits, loss of appetite, bloody stools.   Resp:  No chest wall deformity  Skin: no rash or lesions.  GU: no dysuria, change in color of urine, no urgency or frequency.  No flank pain, no hematuria   MS:   No joint pain or swelling.  No decreased range of motion.  No back pain.    Physical Exam  BP 116/71 (BP Location: Left Arm, Patient Position: Sitting, Cuff Size: Normal)   Pulse 77   Ht 5' 5.5" (1.664 m)   SpO2 92%   GEN: A/Ox3; pleasant , NAD, thin and frail in wc on o2.    HEENT:  [place]/AT,  EACs-clear, TMs-wnl, NOSE-clear, THROAT-clear, no lesions, no postnasal drip or exudate noted.   NECK:  Supple w/ fair ROM; no JVD; normal carotid impulses w/o bruits; no thyromegaly or nodules palpated; no lymphadenopathy.    RESP  Decreased BS in bases ,  no accessory muscle use, no dullness to percussion  CARD:  RRR, no m/r/g, no peripheral edema, pulses intact, no cyanosis or clubbing.  GI:   Soft & nt; nml bowel sounds; no organomegaly or masses detected.   Musco: Warm bil, no deformities or joint swelling noted.   Neuro: alert, no focal deficits noted.    Skin: Warm, no lesions or rashes    Lab Results:  CBC  BMET  BNPImaging: No results found.   Assessment & Plan:   No problem-specific Assessment & Plan notes found for this encounter.     Rexene Edison, NP 07/23/2017

## 2017-07-23 NOTE — Patient Instructions (Addendum)
Continue on BREO 1 puff daily , rinse after use. (Try to take everyday) Continue on prednisone 5mg  daily.  Continue on Albuterol Neb As needed   Continue on Oxygen 2l/m .  Ensure Twice daily  Between meals.  Follow up in 6-8 weeks  as planned and As needed    Please contact office for sooner follow up if symptoms do not improve or worsen or seek emergency care

## 2017-07-23 NOTE — Assessment & Plan Note (Signed)
Ensure b/t meals  

## 2017-07-26 DIAGNOSIS — J449 Chronic obstructive pulmonary disease, unspecified: Secondary | ICD-10-CM | POA: Diagnosis not present

## 2017-07-27 NOTE — Progress Notes (Signed)
Reviewed & agree with plan  

## 2017-07-28 DIAGNOSIS — E43 Unspecified severe protein-calorie malnutrition: Secondary | ICD-10-CM | POA: Diagnosis not present

## 2017-07-28 DIAGNOSIS — S299XXA Unspecified injury of thorax, initial encounter: Secondary | ICD-10-CM | POA: Diagnosis not present

## 2017-07-28 DIAGNOSIS — R0602 Shortness of breath: Secondary | ICD-10-CM | POA: Diagnosis not present

## 2017-07-28 DIAGNOSIS — G8929 Other chronic pain: Secondary | ICD-10-CM | POA: Diagnosis not present

## 2017-07-28 DIAGNOSIS — D509 Iron deficiency anemia, unspecified: Secondary | ICD-10-CM | POA: Diagnosis not present

## 2017-07-28 DIAGNOSIS — Z86711 Personal history of pulmonary embolism: Secondary | ICD-10-CM | POA: Diagnosis not present

## 2017-07-28 DIAGNOSIS — I2510 Atherosclerotic heart disease of native coronary artery without angina pectoris: Secondary | ICD-10-CM | POA: Diagnosis not present

## 2017-07-28 DIAGNOSIS — R079 Chest pain, unspecified: Secondary | ICD-10-CM | POA: Diagnosis not present

## 2017-07-28 DIAGNOSIS — J441 Chronic obstructive pulmonary disease with (acute) exacerbation: Secondary | ICD-10-CM | POA: Diagnosis not present

## 2017-07-28 DIAGNOSIS — Z79899 Other long term (current) drug therapy: Secondary | ICD-10-CM | POA: Diagnosis not present

## 2017-07-28 DIAGNOSIS — R0789 Other chest pain: Secondary | ICD-10-CM | POA: Diagnosis not present

## 2017-07-28 DIAGNOSIS — Z515 Encounter for palliative care: Secondary | ICD-10-CM | POA: Diagnosis not present

## 2017-07-28 DIAGNOSIS — R64 Cachexia: Secondary | ICD-10-CM | POA: Diagnosis not present

## 2017-07-28 DIAGNOSIS — Z86010 Personal history of colonic polyps: Secondary | ICD-10-CM | POA: Diagnosis not present

## 2017-07-28 DIAGNOSIS — Z87442 Personal history of urinary calculi: Secondary | ICD-10-CM | POA: Diagnosis not present

## 2017-07-28 DIAGNOSIS — D500 Iron deficiency anemia secondary to blood loss (chronic): Secondary | ICD-10-CM | POA: Diagnosis not present

## 2017-07-28 DIAGNOSIS — K219 Gastro-esophageal reflux disease without esophagitis: Secondary | ICD-10-CM | POA: Diagnosis not present

## 2017-07-28 DIAGNOSIS — J9621 Acute and chronic respiratory failure with hypoxia: Secondary | ICD-10-CM | POA: Diagnosis not present

## 2017-07-28 DIAGNOSIS — D649 Anemia, unspecified: Secondary | ICD-10-CM | POA: Diagnosis not present

## 2017-07-28 DIAGNOSIS — Z86718 Personal history of other venous thrombosis and embolism: Secondary | ICD-10-CM | POA: Diagnosis not present

## 2017-07-28 DIAGNOSIS — Z87891 Personal history of nicotine dependence: Secondary | ICD-10-CM | POA: Diagnosis not present

## 2017-07-28 DIAGNOSIS — Z7901 Long term (current) use of anticoagulants: Secondary | ICD-10-CM | POA: Diagnosis not present

## 2017-07-28 DIAGNOSIS — R1310 Dysphagia, unspecified: Secondary | ICD-10-CM | POA: Diagnosis not present

## 2017-07-28 DIAGNOSIS — E876 Hypokalemia: Secondary | ICD-10-CM | POA: Diagnosis not present

## 2017-07-28 DIAGNOSIS — I10 Essential (primary) hypertension: Secondary | ICD-10-CM | POA: Diagnosis not present

## 2017-07-28 DIAGNOSIS — J9622 Acute and chronic respiratory failure with hypercapnia: Secondary | ICD-10-CM | POA: Diagnosis not present

## 2017-07-28 DIAGNOSIS — Z9981 Dependence on supplemental oxygen: Secondary | ICD-10-CM | POA: Diagnosis not present

## 2017-07-28 DIAGNOSIS — R5381 Other malaise: Secondary | ICD-10-CM | POA: Diagnosis not present

## 2017-07-28 DIAGNOSIS — K580 Irritable bowel syndrome with diarrhea: Secondary | ICD-10-CM | POA: Diagnosis not present

## 2017-07-28 DIAGNOSIS — R109 Unspecified abdominal pain: Secondary | ICD-10-CM | POA: Diagnosis not present

## 2017-07-28 DIAGNOSIS — Z66 Do not resuscitate: Secondary | ICD-10-CM | POA: Diagnosis not present

## 2017-07-28 DIAGNOSIS — R062 Wheezing: Secondary | ICD-10-CM | POA: Diagnosis not present

## 2017-07-29 DIAGNOSIS — D509 Iron deficiency anemia, unspecified: Secondary | ICD-10-CM | POA: Diagnosis not present

## 2017-07-29 DIAGNOSIS — R64 Cachexia: Secondary | ICD-10-CM | POA: Diagnosis not present

## 2017-07-29 DIAGNOSIS — Z87891 Personal history of nicotine dependence: Secondary | ICD-10-CM | POA: Diagnosis not present

## 2017-07-29 DIAGNOSIS — Z86010 Personal history of colonic polyps: Secondary | ICD-10-CM | POA: Diagnosis not present

## 2017-07-29 DIAGNOSIS — Z86711 Personal history of pulmonary embolism: Secondary | ICD-10-CM | POA: Diagnosis not present

## 2017-07-29 DIAGNOSIS — J9621 Acute and chronic respiratory failure with hypoxia: Secondary | ICD-10-CM | POA: Diagnosis not present

## 2017-07-29 DIAGNOSIS — Z79899 Other long term (current) drug therapy: Secondary | ICD-10-CM | POA: Diagnosis not present

## 2017-07-29 DIAGNOSIS — K580 Irritable bowel syndrome with diarrhea: Secondary | ICD-10-CM | POA: Diagnosis not present

## 2017-07-29 DIAGNOSIS — Z66 Do not resuscitate: Secondary | ICD-10-CM | POA: Diagnosis not present

## 2017-07-29 DIAGNOSIS — E876 Hypokalemia: Secondary | ICD-10-CM | POA: Diagnosis not present

## 2017-07-29 DIAGNOSIS — J9622 Acute and chronic respiratory failure with hypercapnia: Secondary | ICD-10-CM | POA: Diagnosis not present

## 2017-07-29 DIAGNOSIS — R0789 Other chest pain: Secondary | ICD-10-CM | POA: Diagnosis not present

## 2017-07-29 DIAGNOSIS — Z9981 Dependence on supplemental oxygen: Secondary | ICD-10-CM | POA: Diagnosis not present

## 2017-07-29 DIAGNOSIS — Z515 Encounter for palliative care: Secondary | ICD-10-CM | POA: Diagnosis not present

## 2017-07-29 DIAGNOSIS — J441 Chronic obstructive pulmonary disease with (acute) exacerbation: Secondary | ICD-10-CM | POA: Diagnosis not present

## 2017-07-29 DIAGNOSIS — R1310 Dysphagia, unspecified: Secondary | ICD-10-CM | POA: Diagnosis not present

## 2017-07-29 DIAGNOSIS — K219 Gastro-esophageal reflux disease without esophagitis: Secondary | ICD-10-CM | POA: Diagnosis not present

## 2017-07-29 DIAGNOSIS — Z86718 Personal history of other venous thrombosis and embolism: Secondary | ICD-10-CM | POA: Diagnosis not present

## 2017-07-29 DIAGNOSIS — I10 Essential (primary) hypertension: Secondary | ICD-10-CM | POA: Diagnosis not present

## 2017-07-29 DIAGNOSIS — R5381 Other malaise: Secondary | ICD-10-CM | POA: Diagnosis not present

## 2017-07-29 DIAGNOSIS — E43 Unspecified severe protein-calorie malnutrition: Secondary | ICD-10-CM | POA: Diagnosis not present

## 2017-07-29 DIAGNOSIS — Z7901 Long term (current) use of anticoagulants: Secondary | ICD-10-CM | POA: Diagnosis not present

## 2017-07-29 DIAGNOSIS — Z87442 Personal history of urinary calculi: Secondary | ICD-10-CM | POA: Diagnosis not present

## 2017-07-29 DIAGNOSIS — I2510 Atherosclerotic heart disease of native coronary artery without angina pectoris: Secondary | ICD-10-CM | POA: Diagnosis not present

## 2017-07-30 DIAGNOSIS — J441 Chronic obstructive pulmonary disease with (acute) exacerbation: Secondary | ICD-10-CM | POA: Diagnosis not present

## 2017-07-30 DIAGNOSIS — Z66 Do not resuscitate: Secondary | ICD-10-CM | POA: Diagnosis not present

## 2017-07-30 DIAGNOSIS — Z87891 Personal history of nicotine dependence: Secondary | ICD-10-CM | POA: Diagnosis not present

## 2017-07-30 DIAGNOSIS — I10 Essential (primary) hypertension: Secondary | ICD-10-CM | POA: Diagnosis not present

## 2017-07-30 DIAGNOSIS — Z86718 Personal history of other venous thrombosis and embolism: Secondary | ICD-10-CM | POA: Diagnosis not present

## 2017-07-30 DIAGNOSIS — Z9981 Dependence on supplemental oxygen: Secondary | ICD-10-CM | POA: Diagnosis not present

## 2017-07-30 DIAGNOSIS — I2510 Atherosclerotic heart disease of native coronary artery without angina pectoris: Secondary | ICD-10-CM | POA: Diagnosis not present

## 2017-07-30 DIAGNOSIS — Z79899 Other long term (current) drug therapy: Secondary | ICD-10-CM | POA: Diagnosis not present

## 2017-07-30 DIAGNOSIS — D509 Iron deficiency anemia, unspecified: Secondary | ICD-10-CM | POA: Diagnosis not present

## 2017-07-30 DIAGNOSIS — Z7901 Long term (current) use of anticoagulants: Secondary | ICD-10-CM | POA: Diagnosis not present

## 2017-07-30 DIAGNOSIS — Z86711 Personal history of pulmonary embolism: Secondary | ICD-10-CM | POA: Diagnosis not present

## 2017-07-30 DIAGNOSIS — R5381 Other malaise: Secondary | ICD-10-CM | POA: Diagnosis not present

## 2017-07-30 DIAGNOSIS — R0789 Other chest pain: Secondary | ICD-10-CM | POA: Diagnosis not present

## 2017-07-30 DIAGNOSIS — Z87442 Personal history of urinary calculi: Secondary | ICD-10-CM | POA: Diagnosis not present

## 2017-07-30 DIAGNOSIS — R64 Cachexia: Secondary | ICD-10-CM | POA: Diagnosis not present

## 2017-07-30 DIAGNOSIS — E43 Unspecified severe protein-calorie malnutrition: Secondary | ICD-10-CM | POA: Diagnosis not present

## 2017-07-30 DIAGNOSIS — Z515 Encounter for palliative care: Secondary | ICD-10-CM | POA: Diagnosis not present

## 2017-07-30 DIAGNOSIS — J9622 Acute and chronic respiratory failure with hypercapnia: Secondary | ICD-10-CM | POA: Diagnosis not present

## 2017-07-30 DIAGNOSIS — K580 Irritable bowel syndrome with diarrhea: Secondary | ICD-10-CM | POA: Diagnosis not present

## 2017-07-30 DIAGNOSIS — K219 Gastro-esophageal reflux disease without esophagitis: Secondary | ICD-10-CM | POA: Diagnosis not present

## 2017-07-30 DIAGNOSIS — R1310 Dysphagia, unspecified: Secondary | ICD-10-CM | POA: Diagnosis not present

## 2017-07-30 DIAGNOSIS — Z86010 Personal history of colonic polyps: Secondary | ICD-10-CM | POA: Diagnosis not present

## 2017-07-30 DIAGNOSIS — E876 Hypokalemia: Secondary | ICD-10-CM | POA: Diagnosis not present

## 2017-07-30 DIAGNOSIS — J9621 Acute and chronic respiratory failure with hypoxia: Secondary | ICD-10-CM | POA: Diagnosis not present

## 2017-07-31 DIAGNOSIS — R1310 Dysphagia, unspecified: Secondary | ICD-10-CM | POA: Diagnosis not present

## 2017-07-31 DIAGNOSIS — Z87442 Personal history of urinary calculi: Secondary | ICD-10-CM | POA: Diagnosis not present

## 2017-07-31 DIAGNOSIS — Z86718 Personal history of other venous thrombosis and embolism: Secondary | ICD-10-CM | POA: Diagnosis not present

## 2017-07-31 DIAGNOSIS — R64 Cachexia: Secondary | ICD-10-CM | POA: Diagnosis not present

## 2017-07-31 DIAGNOSIS — J441 Chronic obstructive pulmonary disease with (acute) exacerbation: Secondary | ICD-10-CM | POA: Diagnosis not present

## 2017-07-31 DIAGNOSIS — I10 Essential (primary) hypertension: Secondary | ICD-10-CM | POA: Diagnosis not present

## 2017-07-31 DIAGNOSIS — Z86010 Personal history of colonic polyps: Secondary | ICD-10-CM | POA: Diagnosis not present

## 2017-07-31 DIAGNOSIS — K580 Irritable bowel syndrome with diarrhea: Secondary | ICD-10-CM | POA: Diagnosis not present

## 2017-07-31 DIAGNOSIS — Z66 Do not resuscitate: Secondary | ICD-10-CM | POA: Diagnosis not present

## 2017-07-31 DIAGNOSIS — J9621 Acute and chronic respiratory failure with hypoxia: Secondary | ICD-10-CM | POA: Diagnosis not present

## 2017-07-31 DIAGNOSIS — J9622 Acute and chronic respiratory failure with hypercapnia: Secondary | ICD-10-CM | POA: Diagnosis not present

## 2017-07-31 DIAGNOSIS — Z515 Encounter for palliative care: Secondary | ICD-10-CM | POA: Diagnosis not present

## 2017-07-31 DIAGNOSIS — Z9981 Dependence on supplemental oxygen: Secondary | ICD-10-CM | POA: Diagnosis not present

## 2017-07-31 DIAGNOSIS — E876 Hypokalemia: Secondary | ICD-10-CM | POA: Diagnosis not present

## 2017-07-31 DIAGNOSIS — D509 Iron deficiency anemia, unspecified: Secondary | ICD-10-CM | POA: Diagnosis not present

## 2017-07-31 DIAGNOSIS — Z7901 Long term (current) use of anticoagulants: Secondary | ICD-10-CM | POA: Diagnosis not present

## 2017-07-31 DIAGNOSIS — Z79899 Other long term (current) drug therapy: Secondary | ICD-10-CM | POA: Diagnosis not present

## 2017-07-31 DIAGNOSIS — R0789 Other chest pain: Secondary | ICD-10-CM | POA: Diagnosis not present

## 2017-07-31 DIAGNOSIS — Z86711 Personal history of pulmonary embolism: Secondary | ICD-10-CM | POA: Diagnosis not present

## 2017-07-31 DIAGNOSIS — E43 Unspecified severe protein-calorie malnutrition: Secondary | ICD-10-CM | POA: Diagnosis not present

## 2017-07-31 DIAGNOSIS — R5381 Other malaise: Secondary | ICD-10-CM | POA: Diagnosis not present

## 2017-07-31 DIAGNOSIS — I2510 Atherosclerotic heart disease of native coronary artery without angina pectoris: Secondary | ICD-10-CM | POA: Diagnosis not present

## 2017-07-31 DIAGNOSIS — K219 Gastro-esophageal reflux disease without esophagitis: Secondary | ICD-10-CM | POA: Diagnosis not present

## 2017-07-31 DIAGNOSIS — Z87891 Personal history of nicotine dependence: Secondary | ICD-10-CM | POA: Diagnosis not present

## 2017-08-01 DIAGNOSIS — D509 Iron deficiency anemia, unspecified: Secondary | ICD-10-CM | POA: Diagnosis not present

## 2017-08-01 DIAGNOSIS — J441 Chronic obstructive pulmonary disease with (acute) exacerbation: Secondary | ICD-10-CM | POA: Diagnosis not present

## 2017-08-01 DIAGNOSIS — Z87891 Personal history of nicotine dependence: Secondary | ICD-10-CM | POA: Diagnosis not present

## 2017-08-01 DIAGNOSIS — I2510 Atherosclerotic heart disease of native coronary artery without angina pectoris: Secondary | ICD-10-CM | POA: Diagnosis not present

## 2017-08-01 DIAGNOSIS — Z79899 Other long term (current) drug therapy: Secondary | ICD-10-CM | POA: Diagnosis not present

## 2017-08-01 DIAGNOSIS — R0789 Other chest pain: Secondary | ICD-10-CM | POA: Diagnosis not present

## 2017-08-01 DIAGNOSIS — I10 Essential (primary) hypertension: Secondary | ICD-10-CM | POA: Diagnosis not present

## 2017-08-01 DIAGNOSIS — Z86718 Personal history of other venous thrombosis and embolism: Secondary | ICD-10-CM | POA: Diagnosis not present

## 2017-08-01 DIAGNOSIS — Z86711 Personal history of pulmonary embolism: Secondary | ICD-10-CM | POA: Diagnosis not present

## 2017-08-01 DIAGNOSIS — K219 Gastro-esophageal reflux disease without esophagitis: Secondary | ICD-10-CM | POA: Diagnosis not present

## 2017-08-01 DIAGNOSIS — E876 Hypokalemia: Secondary | ICD-10-CM | POA: Diagnosis not present

## 2017-08-01 DIAGNOSIS — Z9981 Dependence on supplemental oxygen: Secondary | ICD-10-CM | POA: Diagnosis not present

## 2017-08-01 DIAGNOSIS — R5381 Other malaise: Secondary | ICD-10-CM | POA: Diagnosis not present

## 2017-08-01 DIAGNOSIS — Z87442 Personal history of urinary calculi: Secondary | ICD-10-CM | POA: Diagnosis not present

## 2017-08-01 DIAGNOSIS — Z515 Encounter for palliative care: Secondary | ICD-10-CM | POA: Diagnosis not present

## 2017-08-01 DIAGNOSIS — E43 Unspecified severe protein-calorie malnutrition: Secondary | ICD-10-CM | POA: Diagnosis not present

## 2017-08-01 DIAGNOSIS — K580 Irritable bowel syndrome with diarrhea: Secondary | ICD-10-CM | POA: Diagnosis not present

## 2017-08-01 DIAGNOSIS — R1310 Dysphagia, unspecified: Secondary | ICD-10-CM | POA: Diagnosis not present

## 2017-08-01 DIAGNOSIS — R64 Cachexia: Secondary | ICD-10-CM | POA: Diagnosis not present

## 2017-08-01 DIAGNOSIS — Z7901 Long term (current) use of anticoagulants: Secondary | ICD-10-CM | POA: Diagnosis not present

## 2017-08-01 DIAGNOSIS — J9622 Acute and chronic respiratory failure with hypercapnia: Secondary | ICD-10-CM | POA: Diagnosis not present

## 2017-08-01 DIAGNOSIS — J9621 Acute and chronic respiratory failure with hypoxia: Secondary | ICD-10-CM | POA: Diagnosis not present

## 2017-08-01 DIAGNOSIS — Z86010 Personal history of colonic polyps: Secondary | ICD-10-CM | POA: Diagnosis not present

## 2017-08-01 DIAGNOSIS — Z66 Do not resuscitate: Secondary | ICD-10-CM | POA: Diagnosis not present

## 2017-08-02 DIAGNOSIS — Z9981 Dependence on supplemental oxygen: Secondary | ICD-10-CM | POA: Diagnosis not present

## 2017-08-02 DIAGNOSIS — J9621 Acute and chronic respiratory failure with hypoxia: Secondary | ICD-10-CM | POA: Diagnosis not present

## 2017-08-02 DIAGNOSIS — Z86010 Personal history of colonic polyps: Secondary | ICD-10-CM | POA: Diagnosis not present

## 2017-08-02 DIAGNOSIS — R5381 Other malaise: Secondary | ICD-10-CM | POA: Diagnosis not present

## 2017-08-02 DIAGNOSIS — K219 Gastro-esophageal reflux disease without esophagitis: Secondary | ICD-10-CM | POA: Diagnosis not present

## 2017-08-02 DIAGNOSIS — I2510 Atherosclerotic heart disease of native coronary artery without angina pectoris: Secondary | ICD-10-CM | POA: Diagnosis not present

## 2017-08-02 DIAGNOSIS — J9622 Acute and chronic respiratory failure with hypercapnia: Secondary | ICD-10-CM | POA: Diagnosis not present

## 2017-08-02 DIAGNOSIS — E43 Unspecified severe protein-calorie malnutrition: Secondary | ICD-10-CM | POA: Diagnosis not present

## 2017-08-02 DIAGNOSIS — R1310 Dysphagia, unspecified: Secondary | ICD-10-CM | POA: Diagnosis not present

## 2017-08-02 DIAGNOSIS — Z79899 Other long term (current) drug therapy: Secondary | ICD-10-CM | POA: Diagnosis not present

## 2017-08-02 DIAGNOSIS — J441 Chronic obstructive pulmonary disease with (acute) exacerbation: Secondary | ICD-10-CM | POA: Diagnosis not present

## 2017-08-02 DIAGNOSIS — D509 Iron deficiency anemia, unspecified: Secondary | ICD-10-CM | POA: Diagnosis not present

## 2017-08-02 DIAGNOSIS — Z86718 Personal history of other venous thrombosis and embolism: Secondary | ICD-10-CM | POA: Diagnosis not present

## 2017-08-02 DIAGNOSIS — Z7901 Long term (current) use of anticoagulants: Secondary | ICD-10-CM | POA: Diagnosis not present

## 2017-08-02 DIAGNOSIS — Z515 Encounter for palliative care: Secondary | ICD-10-CM | POA: Diagnosis not present

## 2017-08-02 DIAGNOSIS — K580 Irritable bowel syndrome with diarrhea: Secondary | ICD-10-CM | POA: Diagnosis not present

## 2017-08-02 DIAGNOSIS — R0789 Other chest pain: Secondary | ICD-10-CM | POA: Diagnosis not present

## 2017-08-02 DIAGNOSIS — R64 Cachexia: Secondary | ICD-10-CM | POA: Diagnosis not present

## 2017-08-02 DIAGNOSIS — Z86711 Personal history of pulmonary embolism: Secondary | ICD-10-CM | POA: Diagnosis not present

## 2017-08-02 DIAGNOSIS — E876 Hypokalemia: Secondary | ICD-10-CM | POA: Diagnosis not present

## 2017-08-02 DIAGNOSIS — I10 Essential (primary) hypertension: Secondary | ICD-10-CM | POA: Diagnosis not present

## 2017-08-02 DIAGNOSIS — Z87891 Personal history of nicotine dependence: Secondary | ICD-10-CM | POA: Diagnosis not present

## 2017-08-02 DIAGNOSIS — Z66 Do not resuscitate: Secondary | ICD-10-CM | POA: Diagnosis not present

## 2017-08-02 DIAGNOSIS — Z87442 Personal history of urinary calculi: Secondary | ICD-10-CM | POA: Diagnosis not present

## 2017-08-02 NOTE — Progress Notes (Signed)
[place] [facility] [address], [place], [place] [address] [phone]  Date:  08/05/2017   Name:  Holly Page   DOB:  07-26-1936   MRN:  [id number]  PCP:  Darreld Mclean, MD    Chief Complaint: Hospitalization Follow-up (Pt here for hosp f/u visit. )   History of Present Illness:  Holly Page is a 81 y.o. very pleasant female patient who presents with the following:  Hospital follow-up today; she was recently admitted to [facility], dc summary as follows:  Admit date: 07/28/2017 Discharge date and time: 08/03/2017  Admitting Physician: Antonieta Pert, MD  Discharge Physician: Wyonia Hough   Admission Diagnoses: Severe end-stage COPD with acute exacerbation  Discharge Diagnoses:  Principal Problem (Resolved): COPD with acute exacerbation ([place]) Active Problems: Anemia, unspecified CAD (coronary artery disease) COPD, very severe (HCC) GERD (gastroesophageal reflux disease) History of DVT (deep vein thrombosis) HTN (hypertension) IBS (irritable bowel syndrome) Protein-calorie malnutrition, severe (HCC) Severe protein-calorie malnutrition (HCC) Resolved Problems: Acute on chronic respiratory failure with hypoxia and hypercapnia (HCC) Chest pain  Admission Condition: poor Discharged Condition: stable  Indication for Admission: Acute hypoxemic respiratory failure in the setting of severe COPD with exacerbation  Hospital Course: Makena McCauleyis a 81 y.o.femalewith very severe COPD/emphysema, chronic respiratory failure with 2 L nasal cannula presented with shortness of breath, chest pain, left han pain and is being treated at [facility] for COPD with acute exacerbation ([place]). She had a fall last week prior to her symptoms getting worse.The ER found to have severe emphysema on the, ABG showed chronic CO2 retention with PCO2 of 79, pH at 7.34 compensated. She was admittedand treated as follows  Severe  COPD/emphysemawith acute exacerbation:adjusted steroidsto 40 mg IV every 6 hours which patient tolerated well.,Consulted and discussed with pulmonaryby previous attending, continued with BIpap nocturnally to help her breathing which patient was weaned off of. Consultedpalliative carewho saw the patient given her severe end-stage disease, appreciate their input.patient is at her baseline which is a very poor functional status. She will be discharged to complete a steroid taper and to continue chronic steroids. Patient is at very high risk of readmission given her end-stage COPD with progressive worsening  Acute on chronic respiratory failure with hypoxia and hypercapnia-related to above. ABG compensated but has chronic CO2 retention, chemistry panel shows bicarb of 45. Prior attending, consulted Dr. Wallis Bamberg see if there is any role of BiPAP nocturnally,although pH is compensated she is now on 2l [place] which is patient's home dose. Unfortunately patient refuses consultation with Dr. Glade Lloyd and will follow up with her outpatient pulmonologist  Chest pain, ongoing for "many years", worse after the fall last week: consistent with musculoskeletal. Serial troponins are negative.Echo obtained and shows " Normal left ventricular size and systolic function with no appreciable segmental abnormality.Ejection fraction is visually estimated at 55-60%,Trace mitral regurgitation.Trace tricuspid regurgitation". No chest pain now,. she is allergic to Aspirin as it causes shortness of breath, so unable to use it. Unable to use beta-blocker due to severe COPD. Cont to continue Tylenol, avoid narcotics due to severe COPD.   Severe protein-calorie malnutrition, suspect related to her severe COPD: Continue to augment nutrition. Dietitian on board.   HypoKalemia: Repleted while in the hospital, no arrhythmias, is now resolved.  Recent Mechanical fall with pain in the left arm, chest, prn analgesics at home no  acute inpatient workup necessary Chronic Anemia, unspecified:Monitored hemoglobin has remained stable GERD:Continue home medications on  discharge no acute complications during hospital stay History of DVTx2-3, hx of PE X1 on eliquis, cont same on discharge IBS, chronic diarrhea/chronic abdominal pain-stable, continue home medication, added imodium for sx relief  Patient's Ordered Code Status: Full Code  Consults:  Orders Placed This Encounter  Procedures  . Consult to Hospitalist (General)  . Nutrition Consult - Adult  . IP CONSULT TO PULMONARY  . Consult To Palliative Care   Disposition: Home  Goals of Care: close f/up with pcp, pt with end stage copd doing poorly as baseline  Patient Instructions:  Discharge Medications: Current Discharge Medication List   START taking these medications  Details  loperamide (IMODIUM A-D) 2 mg tablet Take 1 tablet (2 mg total) by mouth 4 (four) times daily as needed for up to 10 days for Diarrhea. Qty: 30 tablet, Refills: 0   methylPREDNISolone (MEDROL, PAK,) 4 mg tablet follow package directions Qty: 21 tablet, Refills: 0    Pt notes that she does not feel that good, but she is bette than she was She is using her medrol dose pack. She is also on 5 mg of prednisone at baseline which I advised her to continue to use  She was on bipap for a while in the hospital but is sent home on her normal [place] oxygen  She did talk wit palliative care but does not seem to remember about this.  However, she states she does not want to be a full code upon discussion. She would like a DNR form to have at home  She notes that when she uses her concentrator it does not seem to work as well. She feels better on the portable O2 tank that she brought with her today.   Patient Active Problem List   Diagnosis Date Noted  . Iron deficiency anemia due to chronic blood loss 01/23/2017  . 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 Pulmonary embolus ([place]) 12/30/2016  . Severe  sepsis ([place]) 12/26/2016  . Influenza B 12/26/2016  . Acute urinary retention 12/26/2016  . Oropharyngeal dysphagia 12/26/2016  . Metabolic acidosis 26/94/8546  . Polyneuropathy 12/26/2016  . Arthritis   . Clotting disorder ([place])   . CAD (coronary artery disease)   . Fibroid   . Hypertension   . Kidney stone   . Protein-calorie malnutrition, severe 07/23/2016  . Shortness of breath 07/23/2016  . SOB (shortness of breath) 07/21/2016  . Acute on chronic respiratory failure with hypoxia and hypercapnia ([place]) 07/21/2016  . Chronic respiratory failure ([place]) 04/10/2016  . COPD with exacerbation ([place]) 03/19/2016  . RBC microcytosis 03/19/2016  . COPD with acute exacerbation ([place]) 03/19/2016  . Oxygen dependent 12/31/2015  . Anxiety 11/08/2015  . Hyperthyroidism 09/07/2015  . Malnutrition of moderate degree 08/31/2015  . Abnormal LFTs 03/19/2015  . Back pain 03/19/2015  . HTN (hypertension) 03/01/2015  . Anemia 12/19/2014  . Calculus of kidney 08/30/2014  . Hematuria 08/11/2014  . History of DVT (deep vein thrombosis)   . Neuralgia neuritis, sciatic nerve 05/22/2014  . 1st degree AV block 04/24/2014  . History of colon polyps 04/24/2014  . Adaptive colitis 04/24/2014  . APC (atrial premature contractions) 04/24/2014  . Benign neoplasm of meninges ([place]) 02/10/2014  . Barrett esophagus 02/03/2014  . Spinal stenosis 12/15/2013  . Allergic rhinitis 11/21/2013  . COPD with emphysema Gold D   . GERD (gastroesophageal reflux disease)   . Lactose intolerance   . DDD (degenerative disc disease), lumbosacral 06/11/2010  . Diffuse cerebrovascular disease 06/11/2010  Past Medical History:  Diagnosis Date  . Anemia   . Arthritis   . CAD (coronary artery disease)   . Clotting disorder ([place])   . COPD (chronic obstructive pulmonary disease) ([place])   . DVT (deep venous thrombosis) ([place]) 1980s, recurrent 2015   Right DVT 2015  on xarelto  . Fibroid   . GERD (gastroesophageal reflux  disease)   . Hypertension   . Hypertension   . Iron deficiency anemia due to chronic blood loss 01/23/2017  . 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 Kidney stone   . Lactose intolerance   . Meningioma ([place])    followed by Dr Christella Noa  . Pneumonia   . Spinal stenosis     Past Surgical History:  Procedure Laterality Date  . CHOLECYSTECTOMY    . FOOT SURGERY    . INCISION AND DRAINAGE FOOT    . TONSILLECTOMY AND ADENOIDECTOMY    . TREATMENT FISTULA ANAL    . TUBAL LIGATION      Social History   Tobacco Use  . Smoking status: Former Smoker    Packs/day: 1.50    Years: 56.00    Pack years: 84.00    Types: Cigarettes    Last attempt to quit: 04/29/2012    Years since quitting: 5.2  . Smokeless tobacco: Never Used  . Tobacco comment: quit smoking 3 years ago  Substance Use Topics  . Alcohol use: No    Alcohol/week: 0.0 oz    Comment: quit drinking beer 2005  . Drug use: No    Family History  Problem Relation Age of Onset  . COPD Father        smoker deceased.   . Emphysema Father   . Lupus Sister   . Heart attack Son   . Heart disease Son   . Hypertension Son   . Hypertension Son     Allergies  Allergen Reactions  . Aspirin Other (See Comments)    Other reaction(s): PALPITATIONS Other reaction(s): DIFFICULTY BREATHING Heart flutter  . Incruse Ellipta [Umeclidinium Bromide] Shortness Of Breath  . Tramadol Shortness Of Breath  . Amlodipine Other (See Comments)    Other reaction(s): SWELLING  . Codeine Other (See Comments)    Hallucinations, can take Tylenol #3 now  . Doxycycline Other (See Comments)    Other reaction(s): NAUSEA,VOMITING  . Gabapentin Other (See Comments)    Other reaction(s): Other (See Comments) She could not swallow the large pill  . Hydrocodone Other (See Comments)    hallucinations  . Losartan Other (See Comments)    unknown  . Propoxyphene Nausea Only  . Tiotropium Itching and Rash    Medication list has been reviewed and  updated.  Current Outpatient Medications on File Prior to Visit  Medication Sig Dispense Refill  . acetaminophen-codeine (TYLENOL #4) 300-60 MG tablet Take 1 tablet by mouth every 6 (six) hours as needed for moderate pain. 30 tablet 1  . albuterol (PROVENTIL) (2.5 MG/3ML) 0.083% nebulizer solution Take 3 mLs (2.5 mg total) by nebulization every 6 (six) hours as needed for wheezing or shortness of breath. 375 mL 0  . apixaban (ELIQUIS) 2.5 MG TABS tablet Take 1 tablet (2.5 mg total) by mouth 2 (two) times daily. 60 tablet 3  . cholecalciferol (VITAMIN D) 1000 units tablet Take 1,000 Units by mouth daily.    . fluticasone furoate-vilanterol (BREO ELLIPTA) 200-25 MCG/INH AEPB Inhale 1 puff into the lungs daily. 2 each 0  . Multiple Vitamins-Minerals (MULTIVITAMIN & MINERAL PO) Take  1 tablet by mouth daily.    . Nutritional Supplements (ENSURE ENLIVE PO) Take 237 mLs by mouth 2 (two) times daily.    . OXYGEN Inhale 2 L into the lungs daily as needed. To maintain o2 saturation of 90%    . potassium chloride (K-DUR) 10 MEQ tablet Take 1 tablet (10 mEq total) by mouth daily. 7 tablet 0  . predniSONE (DELTASONE) 5 MG tablet Take 1 tablet (5 mg total) by mouth daily with breakfast. 30 tablet 1  . PROAIR HFA 108 (90 Base) MCG/ACT inhaler     . ranitidine (ZANTAC) 300 MG capsule Take 1 capsule (300 mg total) by mouth every evening. 30 capsule 11  . solifenacin (VESICARE) 5 MG tablet Take 5 mg by mouth.    . verapamil (CALAN-SR) 240 MG CR tablet Take 1 tablet (240 mg total) by mouth at bedtime. 90 tablet 3   No current facility-administered medications on file prior to visit.     Review of Systems:  As per HPI- otherwise negative. No fever or chills Continues to feel weak, tired, easily out of breath with activity    Physical Examination: Vitals:   08/05/17 1136  BP: 110/62  Pulse: 75  Temp: 98.2 F (36.8 C)  SpO2: 98%   Vitals:   08/05/17 1136  Height: 5' 5.5" (1.664 m)   Body mass  index is 15.27 kg/m. Ideal Body Weight: Weight in (lb) to have BMI = 25: 152.2  GEN: NAD, Non-toxic, A & O x 3, cachectic, appears her normal self  HEENT: Atraumatic, Normocephalic. Neck supple. No masses, No LAD. Ears and Nose: No external deformity. CV: RRR, No M/G/R. No JVD. No thrill. No extra heart sounds. PULM: CTA B, no wheezes, crackles, rhonchi. No retractions. No resp. distress. No accessory muscle use. EXTR: No c/c/e NEURO in [facility] PSYCH: Normally interactive. Conversant. Not depressed or anxious appearing.  Calm demeanor.  Wearing oxygen via [place], portable tank today   She would like a new oxygen concentrator thorough [facility] Assessment and Plan: End stage COPD ([place])  Oxygen dependent  Physical deconditioning  Here today to follow-up on her end-stage COPD Recent hospital stay.  She is getting back to her baseline at this point.  She does seem to be understanding that her life expectancy is limited. Discussed a DNR form and she would like to sign this today.   Copy for chart and for her home.  She understands that this means she would not be resuscitated in the event of cardiopulmonary arrest.  I did call [facility] and they will check on her concentrator at home   Signed Lamar Blinks, MD

## 2017-08-03 DIAGNOSIS — R1310 Dysphagia, unspecified: Secondary | ICD-10-CM | POA: Diagnosis not present

## 2017-08-03 DIAGNOSIS — R0789 Other chest pain: Secondary | ICD-10-CM | POA: Diagnosis not present

## 2017-08-03 DIAGNOSIS — Z515 Encounter for palliative care: Secondary | ICD-10-CM | POA: Diagnosis not present

## 2017-08-03 DIAGNOSIS — Z86718 Personal history of other venous thrombosis and embolism: Secondary | ICD-10-CM | POA: Diagnosis not present

## 2017-08-03 DIAGNOSIS — Z87442 Personal history of urinary calculi: Secondary | ICD-10-CM | POA: Diagnosis not present

## 2017-08-03 DIAGNOSIS — Z79899 Other long term (current) drug therapy: Secondary | ICD-10-CM | POA: Diagnosis not present

## 2017-08-03 DIAGNOSIS — J9621 Acute and chronic respiratory failure with hypoxia: Secondary | ICD-10-CM | POA: Diagnosis not present

## 2017-08-03 DIAGNOSIS — Z9981 Dependence on supplemental oxygen: Secondary | ICD-10-CM | POA: Diagnosis not present

## 2017-08-03 DIAGNOSIS — Z87891 Personal history of nicotine dependence: Secondary | ICD-10-CM | POA: Diagnosis not present

## 2017-08-03 DIAGNOSIS — R64 Cachexia: Secondary | ICD-10-CM | POA: Diagnosis not present

## 2017-08-03 DIAGNOSIS — D509 Iron deficiency anemia, unspecified: Secondary | ICD-10-CM | POA: Diagnosis not present

## 2017-08-03 DIAGNOSIS — K219 Gastro-esophageal reflux disease without esophagitis: Secondary | ICD-10-CM | POA: Diagnosis not present

## 2017-08-03 DIAGNOSIS — E43 Unspecified severe protein-calorie malnutrition: Secondary | ICD-10-CM | POA: Diagnosis not present

## 2017-08-03 DIAGNOSIS — E876 Hypokalemia: Secondary | ICD-10-CM | POA: Diagnosis not present

## 2017-08-03 DIAGNOSIS — I10 Essential (primary) hypertension: Secondary | ICD-10-CM | POA: Diagnosis not present

## 2017-08-03 DIAGNOSIS — R5381 Other malaise: Secondary | ICD-10-CM | POA: Diagnosis not present

## 2017-08-03 DIAGNOSIS — J441 Chronic obstructive pulmonary disease with (acute) exacerbation: Secondary | ICD-10-CM | POA: Diagnosis not present

## 2017-08-03 DIAGNOSIS — Z7901 Long term (current) use of anticoagulants: Secondary | ICD-10-CM | POA: Diagnosis not present

## 2017-08-03 DIAGNOSIS — Z86010 Personal history of colonic polyps: Secondary | ICD-10-CM | POA: Diagnosis not present

## 2017-08-03 DIAGNOSIS — K580 Irritable bowel syndrome with diarrhea: Secondary | ICD-10-CM | POA: Diagnosis not present

## 2017-08-03 DIAGNOSIS — J9622 Acute and chronic respiratory failure with hypercapnia: Secondary | ICD-10-CM | POA: Diagnosis not present

## 2017-08-03 DIAGNOSIS — I2510 Atherosclerotic heart disease of native coronary artery without angina pectoris: Secondary | ICD-10-CM | POA: Diagnosis not present

## 2017-08-03 DIAGNOSIS — Z86711 Personal history of pulmonary embolism: Secondary | ICD-10-CM | POA: Diagnosis not present

## 2017-08-03 DIAGNOSIS — Z66 Do not resuscitate: Secondary | ICD-10-CM | POA: Diagnosis not present

## 2017-08-03 MED ORDER — ALBUTEROL SULFATE HFA 108 (90 BASE) MCG/ACT IN AERS
2.00 | INHALATION_SPRAY | RESPIRATORY_TRACT | Status: DC
Start: ? — End: 2017-08-03

## 2017-08-03 MED ORDER — ACETAMINOPHEN-CODEINE #3 300-30 MG PO TABS
1.00 | ORAL_TABLET | ORAL | Status: DC
Start: ? — End: 2017-08-03

## 2017-08-03 MED ORDER — VERAPAMIL HCL ER 120 MG PO TBCR
240.00 mg | EXTENDED_RELEASE_TABLET | ORAL | Status: DC
Start: 2017-08-04 — End: 2017-08-03

## 2017-08-03 MED ORDER — OXYBUTYNIN CHLORIDE 5 MG PO TABS
5.00 mg | ORAL_TABLET | ORAL | Status: DC
Start: 2017-08-03 — End: 2017-08-03

## 2017-08-03 MED ORDER — ALBUTEROL SULFATE (2.5 MG/3ML) 0.083% IN NEBU
2.50 mg | INHALATION_SOLUTION | RESPIRATORY_TRACT | Status: DC
Start: 2017-08-03 — End: 2017-08-03

## 2017-08-03 MED ORDER — GENERIC EXTERNAL MEDICATION
325.00 mg | Status: DC
Start: 2017-08-04 — End: 2017-08-03

## 2017-08-03 MED ORDER — MULTI-VITAMINS PO TABS
1.00 | ORAL_TABLET | ORAL | Status: DC
Start: 2017-08-04 — End: 2017-08-03

## 2017-08-03 MED ORDER — SODIUM CHLORIDE 0.65 % NA SOLN
1.00 | NASAL | Status: DC
Start: ? — End: 2017-08-03

## 2017-08-03 MED ORDER — APIXABAN 2.5 MG PO TABS
2.50 mg | ORAL_TABLET | ORAL | Status: DC
Start: 2017-08-03 — End: 2017-08-03

## 2017-08-03 MED ORDER — LOPERAMIDE HCL 2 MG PO CAPS
2.00 mg | ORAL_CAPSULE | ORAL | Status: DC
Start: ? — End: 2017-08-03

## 2017-08-03 MED ORDER — FLUTICASONE FUROATE-VILANTEROL 200-25 MCG/INH IN AEPB
1.00 | INHALATION_SPRAY | RESPIRATORY_TRACT | Status: DC
Start: 2017-08-03 — End: 2017-08-03

## 2017-08-03 MED ORDER — CHOLECALCIFEROL 25 MCG (1000 UT) PO TABS
1000.00 | ORAL_TABLET | ORAL | Status: DC
Start: 2017-08-04 — End: 2017-08-03

## 2017-08-03 MED ORDER — FAMOTIDINE 20 MG PO TABS
20.00 mg | ORAL_TABLET | ORAL | Status: DC
Start: 2017-08-03 — End: 2017-08-03

## 2017-08-03 MED ORDER — METHYLPREDNISOLONE SODIUM SUCC 40 MG IJ SOLR
40.00 mg | INTRAMUSCULAR | Status: DC
Start: 2017-08-03 — End: 2017-08-03

## 2017-08-04 DIAGNOSIS — I2510 Atherosclerotic heart disease of native coronary artery without angina pectoris: Secondary | ICD-10-CM | POA: Diagnosis not present

## 2017-08-04 DIAGNOSIS — J180 Bronchopneumonia, unspecified organism: Secondary | ICD-10-CM | POA: Diagnosis not present

## 2017-08-04 DIAGNOSIS — I700 Atherosclerosis of aorta: Secondary | ICD-10-CM | POA: Diagnosis not present

## 2017-08-04 DIAGNOSIS — J9612 Chronic respiratory failure with hypercapnia: Secondary | ICD-10-CM | POA: Diagnosis not present

## 2017-08-04 DIAGNOSIS — J439 Emphysema, unspecified: Secondary | ICD-10-CM | POA: Diagnosis not present

## 2017-08-04 DIAGNOSIS — I2609 Other pulmonary embolism with acute cor pulmonale: Secondary | ICD-10-CM | POA: Diagnosis not present

## 2017-08-04 DIAGNOSIS — J9611 Chronic respiratory failure with hypoxia: Secondary | ICD-10-CM | POA: Diagnosis not present

## 2017-08-04 DIAGNOSIS — E43 Unspecified severe protein-calorie malnutrition: Secondary | ICD-10-CM | POA: Diagnosis not present

## 2017-08-04 DIAGNOSIS — M5137 Other intervertebral disc degeneration, lumbosacral region: Secondary | ICD-10-CM | POA: Diagnosis not present

## 2017-08-04 DIAGNOSIS — R1312 Dysphagia, oropharyngeal phase: Secondary | ICD-10-CM | POA: Diagnosis not present

## 2017-08-04 DIAGNOSIS — D509 Iron deficiency anemia, unspecified: Secondary | ICD-10-CM | POA: Diagnosis not present

## 2017-08-04 DIAGNOSIS — I10 Essential (primary) hypertension: Secondary | ICD-10-CM | POA: Diagnosis not present

## 2017-08-04 DIAGNOSIS — D329 Benign neoplasm of meninges, unspecified: Secondary | ICD-10-CM | POA: Diagnosis not present

## 2017-08-05 ENCOUNTER — Ambulatory Visit (INDEPENDENT_AMBULATORY_CARE_PROVIDER_SITE_OTHER): Payer: Medicare Other | Admitting: Family Medicine

## 2017-08-05 ENCOUNTER — Encounter: Payer: Self-pay | Admitting: Family Medicine

## 2017-08-05 VITALS — BP 110/62 | HR 75 | Temp 98.2°F | Ht 65.5 in

## 2017-08-05 DIAGNOSIS — Z9981 Dependence on supplemental oxygen: Secondary | ICD-10-CM | POA: Diagnosis not present

## 2017-08-05 DIAGNOSIS — J449 Chronic obstructive pulmonary disease, unspecified: Secondary | ICD-10-CM

## 2017-08-05 DIAGNOSIS — R5381 Other malaise: Secondary | ICD-10-CM | POA: Diagnosis not present

## 2017-08-05 NOTE — Patient Instructions (Signed)
It was good to see you today- sorry that you were so sick!  I will contact [facility] and see if they are able to come out and check your concentrator or give you a new one.    Please see me in about 3 months and take care!

## 2017-08-06 ENCOUNTER — Telehealth: Payer: Self-pay | Admitting: Family Medicine

## 2017-08-06 DIAGNOSIS — J449 Chronic obstructive pulmonary disease, unspecified: Secondary | ICD-10-CM

## 2017-08-06 NOTE — Telephone Encounter (Addendum)
COPD EXACERBATION. DISEASE AND MED MGMT. [facility] needs Verbal Orders to Continue ONCE A WEEK FOR FIVE WEEKS. THREE PRN VISITS. Call Olin Hauser at [facility] [phone].

## 2017-08-06 NOTE — Telephone Encounter (Signed)
Pt says she wants [facility] to send someone to help her in the mornings when she is so short of breath. Pt says it takes her so long to cook breakfast she is too tired and not even hungry to eat by the time she is able to finish cooking. Pt says her ins will pay for her to have some help.

## 2017-08-07 ENCOUNTER — Other Ambulatory Visit: Payer: Self-pay | Admitting: Family Medicine

## 2017-08-07 DIAGNOSIS — J449 Chronic obstructive pulmonary disease, unspecified: Secondary | ICD-10-CM

## 2017-08-07 NOTE — Telephone Encounter (Signed)
Pt called in to follow up on request.   Please assist further.    Thanks.

## 2017-08-10 ENCOUNTER — Telehealth: Payer: Self-pay | Admitting: *Deleted

## 2017-08-10 NOTE — Telephone Encounter (Signed)
Caller name: Olin Hauser  Relation to pt: RN from [place] at [employer] back number: [phone]  Pharmacy:  Reason for call:  RN following up for verbal orders to continue evaluation for copd exacerbation, please advise

## 2017-08-10 NOTE — Telephone Encounter (Signed)
Called [facility] informed ok per PCP for request

## 2017-08-10 NOTE — Telephone Encounter (Signed)
Received Physician Orders from [facility]; forwarded to provider/SLS 11/12

## 2017-08-11 DIAGNOSIS — J9612 Chronic respiratory failure with hypercapnia: Secondary | ICD-10-CM | POA: Diagnosis not present

## 2017-08-11 DIAGNOSIS — I2510 Atherosclerotic heart disease of native coronary artery without angina pectoris: Secondary | ICD-10-CM | POA: Diagnosis not present

## 2017-08-11 DIAGNOSIS — D329 Benign neoplasm of meninges, unspecified: Secondary | ICD-10-CM | POA: Diagnosis not present

## 2017-08-11 DIAGNOSIS — J180 Bronchopneumonia, unspecified organism: Secondary | ICD-10-CM | POA: Diagnosis not present

## 2017-08-11 DIAGNOSIS — J9611 Chronic respiratory failure with hypoxia: Secondary | ICD-10-CM | POA: Diagnosis not present

## 2017-08-11 DIAGNOSIS — I10 Essential (primary) hypertension: Secondary | ICD-10-CM | POA: Diagnosis not present

## 2017-08-11 DIAGNOSIS — J439 Emphysema, unspecified: Secondary | ICD-10-CM | POA: Diagnosis not present

## 2017-08-11 DIAGNOSIS — I2609 Other pulmonary embolism with acute cor pulmonale: Secondary | ICD-10-CM | POA: Diagnosis not present

## 2017-08-11 DIAGNOSIS — R1312 Dysphagia, oropharyngeal phase: Secondary | ICD-10-CM | POA: Diagnosis not present

## 2017-08-11 DIAGNOSIS — E43 Unspecified severe protein-calorie malnutrition: Secondary | ICD-10-CM | POA: Diagnosis not present

## 2017-08-11 DIAGNOSIS — D509 Iron deficiency anemia, unspecified: Secondary | ICD-10-CM | POA: Diagnosis not present

## 2017-08-11 DIAGNOSIS — I700 Atherosclerosis of aorta: Secondary | ICD-10-CM | POA: Diagnosis not present

## 2017-08-11 DIAGNOSIS — M5137 Other intervertebral disc degeneration, lumbosacral region: Secondary | ICD-10-CM | POA: Diagnosis not present

## 2017-08-12 ENCOUNTER — Other Ambulatory Visit (HOSPITAL_BASED_OUTPATIENT_CLINIC_OR_DEPARTMENT_OTHER): Payer: Medicare Other

## 2017-08-12 ENCOUNTER — Other Ambulatory Visit: Payer: Self-pay

## 2017-08-12 ENCOUNTER — Ambulatory Visit (HOSPITAL_BASED_OUTPATIENT_CLINIC_OR_DEPARTMENT_OTHER): Payer: Medicare Other | Admitting: Family

## 2017-08-12 ENCOUNTER — Other Ambulatory Visit: Payer: Self-pay | Admitting: Family

## 2017-08-12 ENCOUNTER — Telehealth: Payer: Self-pay | Admitting: Family Medicine

## 2017-08-12 ENCOUNTER — Encounter: Payer: Self-pay | Admitting: Family

## 2017-08-12 VITALS — BP 118/59 | HR 86 | Temp 98.1°F | Resp 18

## 2017-08-12 DIAGNOSIS — K909 Intestinal malabsorption, unspecified: Secondary | ICD-10-CM

## 2017-08-12 DIAGNOSIS — I2782 Chronic pulmonary embolism: Secondary | ICD-10-CM

## 2017-08-12 DIAGNOSIS — Z86718 Personal history of other venous thrombosis and embolism: Secondary | ICD-10-CM

## 2017-08-12 DIAGNOSIS — I2609 Other pulmonary embolism with acute cor pulmonale: Secondary | ICD-10-CM | POA: Diagnosis not present

## 2017-08-12 DIAGNOSIS — D509 Iron deficiency anemia, unspecified: Secondary | ICD-10-CM | POA: Diagnosis not present

## 2017-08-12 DIAGNOSIS — D500 Iron deficiency anemia secondary to blood loss (chronic): Secondary | ICD-10-CM

## 2017-08-12 DIAGNOSIS — M79605 Pain in left leg: Secondary | ICD-10-CM

## 2017-08-12 LAB — CMP (CANCER CENTER ONLY)
ALBUMIN: 3.9 g/dL (ref 3.3–5.5)
ALT(SGPT): 31 U/L (ref 10–47)
AST: 55 U/L — ABNORMAL HIGH (ref 11–38)
Alkaline Phosphatase: 71 U/L (ref 26–84)
BUN, Bld: 9 mg/dL (ref 7–22)
CALCIUM: 10.3 mg/dL (ref 8.0–10.3)
CHLORIDE: 97 meq/L — AB (ref 98–108)
CO2: 37 meq/L — AB (ref 18–33)
CREATININE: 0.7 mg/dL (ref 0.6–1.2)
Glucose, Bld: 112 mg/dL (ref 73–118)
POTASSIUM: 5.4 meq/L — AB (ref 3.3–4.7)
Sodium: 153 mEq/L — ABNORMAL HIGH (ref 128–145)
TOTAL PROTEIN: 7.1 g/dL (ref 6.4–8.1)
Total Bilirubin: 0.9 mg/dl (ref 0.20–1.60)

## 2017-08-12 LAB — CBC WITH DIFFERENTIAL (CANCER CENTER ONLY)
BASO#: 0 10*3/uL (ref 0.0–0.2)
BASO%: 0.1 % (ref 0.0–2.0)
EOS%: 0.6 % (ref 0.0–7.0)
Eosinophils Absolute: 0.1 10*3/uL (ref 0.0–0.5)
HCT: 39.1 % (ref 34.8–46.6)
HGB: 11.4 g/dL — ABNORMAL LOW (ref 11.6–15.9)
LYMPH#: 1.3 10*3/uL (ref 0.9–3.3)
LYMPH%: 14.8 % (ref 14.0–48.0)
MCH: 23.2 pg — ABNORMAL LOW (ref 26.0–34.0)
MCHC: 29.2 g/dL — AB (ref 32.0–36.0)
MCV: 80 fL — ABNORMAL LOW (ref 81–101)
MONO#: 0.5 10*3/uL (ref 0.1–0.9)
MONO%: 6.2 % (ref 0.0–13.0)
NEUT#: 6.9 10*3/uL — ABNORMAL HIGH (ref 1.5–6.5)
NEUT%: 78.3 % (ref 39.6–80.0)
PLATELETS: 299 10*3/uL (ref 145–400)
RBC: 4.91 10*6/uL (ref 3.70–5.32)
RDW: 16.7 % — AB (ref 11.1–15.7)
WBC: 8.8 10*3/uL (ref 3.9–10.0)

## 2017-08-12 LAB — FERRITIN: Ferritin: 279 ng/ml — ABNORMAL HIGH (ref 9–269)

## 2017-08-12 LAB — IRON AND TIBC
%SAT: 44 % (ref 21–57)
IRON: 147 ug/dL — AB (ref 41–142)
TIBC: 336 ug/dL (ref 236–444)
UIBC: 189 ug/dL (ref 120–384)

## 2017-08-12 NOTE — Telephone Encounter (Signed)
Olin Hauser - [facility] called in to request social work hospice care for pt. She said that in home care will not work for pt. She would like to have orders    CB: [phone]

## 2017-08-12 NOTE — Progress Notes (Signed)
Hematology and Oncology Follow Up Visit  Holly Page [id number] 07/06/1936 81 y.o. 08/12/2017   Principle Diagnosis:  Thromboembolic disease of the right thigh Superficial thrombus of the left arm  Pulmonary Embolism - dx in 11/2016  Current Therapy:   Eliquis 2.5 mg PO BID   Interim History:  Holly Page is here today for follow-up. She is doing fairly well and denies having any new issues.  She is doing well on 2L [place] supplemental O2 24 hours a day. Her SOB with exertion due to COPD is unchanged.  She is doing well on Eliquis 2.5 mg PO BID. She denies any episodes of bleeding no bruising or petechiae.  She did trip and fall on her son's porch but thankfully was not injured. No syncope.  No fever, chills, n/v, cough, rash, dizziness, SOB, chest pain, palpitations, abdominal pain or changes in bowel or bladder habits.  No swelling or tenderness in her extremities. She has numbness and tingling in her feet that comes and goes.  She states that her appetite has improved and she is drinking 2 Ensure a day. She is also hydrating well. She refused to be weighed today.   ECOG Performance Status: 2 - Symptomatic, <50% confined to bed  Medications:  Allergies as of 08/12/2017      Reactions   Aspirin Other (See Comments)   Other reaction(s): PALPITATIONS Other reaction(s): DIFFICULTY BREATHING Heart flutter   Incruse Ellipta [umeclidinium Bromide] Shortness Of Breath   Tramadol Shortness Of Breath   Amlodipine Other (See Comments)   Other reaction(s): SWELLING   Codeine Other (See Comments)   Hallucinations, can take Tylenol #3 now   Doxycycline Other (See Comments)   Other reaction(s): NAUSEA,VOMITING   Gabapentin Other (See Comments)   Other reaction(s): Other (See Comments) She could not swallow the large pill   Hydrocodone Other (See Comments)   hallucinations   Losartan Other (See Comments)   unknown   Propoxyphene Nausea Only   Tiotropium Itching, Rash        Medication List        Accurate as of 08/12/17 10:11 AM. Always use your most recent med list.          acetaminophen-codeine 300-60 MG tablet Commonly known as:  TYLENOL #4 Take 1 tablet by mouth every 6 (six) hours as needed for moderate pain.   apixaban 2.5 MG Tabs tablet Commonly known as:  ELIQUIS Take 1 tablet (2.5 mg total) by mouth 2 (two) times daily.   ELIQUIS 2.5 MG Tabs tablet Generic drug:  apixaban TAKE 1 TABLET (2.5 MG TOTAL) BY MOUTH 2 (TWO) TIMES DAILY.   cholecalciferol 1000 units tablet Commonly known as:  VITAMIN D Take 1,000 Units by mouth daily.   ENSURE ENLIVE PO Take 237 mLs by mouth 2 (two) times daily.   fluticasone furoate-vilanterol 200-25 MCG/INH Aepb Commonly known as:  BREO ELLIPTA Inhale 1 puff into the lungs daily.   MULTIVITAMIN & MINERAL PO Take 1 tablet by mouth daily.   OXYGEN Inhale 2 L into the lungs daily as needed. To maintain o2 saturation of 90%   potassium chloride 10 MEQ tablet Commonly known as:  K-DUR Take 1 tablet (10 mEq total) by mouth daily.   predniSONE 5 MG tablet Commonly known as:  DELTASONE Take 1 tablet (5 mg total) by mouth daily with breakfast.   PROAIR HFA 108 (90 Base) MCG/ACT inhaler Generic drug:  albuterol   albuterol (2.5 MG/3ML) 0.083% nebulizer solution Commonly known as:  PROVENTIL USE 1 VIA EVERY 6 HOURS AS NEEDED FOR WHEEZING OR SHORTNESS OF BREATH   ranitidine 300 MG capsule Commonly known as:  ZANTAC Take 1 capsule (300 mg total) by mouth every evening.   solifenacin 5 MG tablet Commonly known as:  VESICARE Take 5 mg by mouth.   verapamil 240 MG CR tablet Commonly known as:  CALAN-SR Take 1 tablet (240 mg total) by mouth at bedtime.       Allergies:  Allergies  Allergen Reactions  . Aspirin Other (See Comments)    Other reaction(s): PALPITATIONS Other reaction(s): DIFFICULTY BREATHING Heart flutter  . Incruse Ellipta [Umeclidinium Bromide] Shortness Of Breath  .  Tramadol Shortness Of Breath  . Amlodipine Other (See Comments)    Other reaction(s): SWELLING  . Codeine Other (See Comments)    Hallucinations, can take Tylenol #3 now  . Doxycycline Other (See Comments)    Other reaction(s): NAUSEA,VOMITING  . Gabapentin Other (See Comments)    Other reaction(s): Other (See Comments) She could not swallow the large pill  . Hydrocodone Other (See Comments)    hallucinations  . Losartan Other (See Comments)    unknown  . Propoxyphene Nausea Only  . Tiotropium Itching and Rash    Past Medical History, Surgical history, Social history, and Family History were reviewed and updated.  Review of Systems: All other 10 point review of systems is negative.   Physical Exam:  vitals were not taken for this visit.   Wt Readings from Last 3 Encounters:  06/24/17 93 lb 3.2 oz (42.3 kg)  06/04/17 91 lb (41.3 kg)  05/15/17 89 lb 12 oz (40.7 kg)    Ocular: Sclerae unicteric, pupils equal, round and reactive to light Ear-nose-throat: Oropharynx clear, dentition fair Lymphatic: No cervical, supraclavicular or axillary adenopathy Lungs no rales or rhonchi, good excursion bilaterally Heart regular rate and rhythm, no murmur appreciated Abd soft, nontender, positive bowel sounds, no liver or spleen tip palpated on exam, no fluid wave  MSK no focal spinal tenderness, no joint edema Neuro: non-focal, well-oriented, appropriate affect Breasts: Deferred   Lab Results  Component Value Date   WBC 8.8 08/12/2017   HGB 11.4 (L) 08/12/2017   HCT 39.1 08/12/2017   MCV 80 (L) 08/12/2017   PLT 299 08/12/2017   Lab Results  Component Value Date   FERRITIN 129 05/15/2017   IRON 103 05/15/2017   TIBC 346 05/15/2017   UIBC 244 05/15/2017   IRONPCTSAT 30 05/15/2017   Lab Results  Component Value Date   RBC 4.91 08/12/2017   No results found for: KPAFRELGTCHN, LAMBDASER, KAPLAMBRATIO No results found for: IGGSERUM, IGA, IGMSERUM No results found for:  Odetta Pink, SPEI   Chemistry      Component Value Date/Time   NA 143 06/24/2017 1519   NA 145 05/15/2017 0852   K 3.9 06/24/2017 1519   K 3.3 (L) 05/15/2017 0852   CL 96 06/24/2017 1519   CL 102 03/06/2017 1311   CO2 42 (H) 06/24/2017 1519   CO2 39 (H) 05/15/2017 0852   BUN 11 06/24/2017 1519   BUN 11.2 05/15/2017 0852   CREATININE 0.47 06/24/2017 1519   CREATININE 0.6 05/15/2017 0852   GLU 105 12/25/2016      Component Value Date/Time   CALCIUM 10.0 06/24/2017 1519   CALCIUM 9.9 05/15/2017 0852   ALKPHOS 59 06/10/2017 1001   ALKPHOS 61 05/15/2017 0852   AST 43 (H) 06/10/2017 1001  AST 48 (H) 05/15/2017 0852   ALT 19 06/10/2017 1001   ALT 22 05/15/2017 0852   BILITOT 0.5 06/10/2017 1001   BILITOT 0.48 05/15/2017 0852      Impression and Plan: Ms. Hewins is a very pleasant 81 yo African American female with history of PE, DVT and superficial thrombus. She is on lifelong anticoagulation with low dose Eliquis (2.5 mg PO BID). She is doing well and has had no episodes of bleeding.  She also has COPD and on supplemental O2 24 hours a day.  We will continue to follow along with her and plan to see her back again in another 3 months.  Both she and her family are good to contact our office with any questions or concerns. We can certainly see her sooner if need be.   Eliezer Bottom, NP 11/14/201810:11 AM

## 2017-08-13 NOTE — Telephone Encounter (Signed)
Called Olin Hauser and gave VO

## 2017-08-14 ENCOUNTER — Telehealth: Payer: Self-pay | Admitting: *Deleted

## 2017-08-14 NOTE — Telephone Encounter (Signed)
Received Physician Orders/Plan of Care from [facility]; forwarded to provider/SLS 01/16

## 2017-08-17 DIAGNOSIS — M5137 Other intervertebral disc degeneration, lumbosacral region: Secondary | ICD-10-CM | POA: Diagnosis not present

## 2017-08-17 DIAGNOSIS — I10 Essential (primary) hypertension: Secondary | ICD-10-CM | POA: Diagnosis not present

## 2017-08-17 DIAGNOSIS — J439 Emphysema, unspecified: Secondary | ICD-10-CM | POA: Diagnosis not present

## 2017-08-17 DIAGNOSIS — E43 Unspecified severe protein-calorie malnutrition: Secondary | ICD-10-CM | POA: Diagnosis not present

## 2017-08-17 DIAGNOSIS — I2609 Other pulmonary embolism with acute cor pulmonale: Secondary | ICD-10-CM | POA: Diagnosis not present

## 2017-08-17 DIAGNOSIS — J9612 Chronic respiratory failure with hypercapnia: Secondary | ICD-10-CM | POA: Diagnosis not present

## 2017-08-17 DIAGNOSIS — R1312 Dysphagia, oropharyngeal phase: Secondary | ICD-10-CM | POA: Diagnosis not present

## 2017-08-17 DIAGNOSIS — I700 Atherosclerosis of aorta: Secondary | ICD-10-CM | POA: Diagnosis not present

## 2017-08-17 DIAGNOSIS — D329 Benign neoplasm of meninges, unspecified: Secondary | ICD-10-CM | POA: Diagnosis not present

## 2017-08-17 DIAGNOSIS — I2510 Atherosclerotic heart disease of native coronary artery without angina pectoris: Secondary | ICD-10-CM | POA: Diagnosis not present

## 2017-08-17 DIAGNOSIS — J180 Bronchopneumonia, unspecified organism: Secondary | ICD-10-CM | POA: Diagnosis not present

## 2017-08-17 DIAGNOSIS — J9611 Chronic respiratory failure with hypoxia: Secondary | ICD-10-CM | POA: Diagnosis not present

## 2017-08-17 DIAGNOSIS — D509 Iron deficiency anemia, unspecified: Secondary | ICD-10-CM | POA: Diagnosis not present

## 2017-08-18 ENCOUNTER — Telehealth: Payer: Self-pay | Admitting: Family Medicine

## 2017-08-18 NOTE — Telephone Encounter (Signed)
Copied from [place] [id number]. Topic: Referral - Request >> Aug 18, 2017  4:18 PM Corie Chiquito, [place] wrote: Reason for CRM: Leroy Sea from [facility] called because they need a referral to be seen in her home if someone could please give him a call at [phone]. Leroy Sea will be sending over a fax as well.

## 2017-08-18 NOTE — Telephone Encounter (Signed)
Called him back- they will be faxing over a form for me to sign- they also need demographics and some records supporting her dx of COPD.  Will be glad to take care of this asap

## 2017-08-19 NOTE — Telephone Encounter (Signed)
Done, see note/SLS 11/21

## 2017-08-19 NOTE — Telephone Encounter (Signed)
Caller name: Leroy Sea from [facility]   Reason for call:   Checking on the status of forms mentioned below and would like forms faxed to # [phone], did inform Leroy Sea forms have been forwarded to PCP.

## 2017-08-19 NOTE — Telephone Encounter (Signed)
Received forms from [facility], attached requested records; forwarded to provider/SLS 11/21 Received form brought in by Son to provider information to [facility] RE: "Information for [place]"; forwarded to provider for review and signatures, then faxed/SLS 11/21

## 2017-08-25 DIAGNOSIS — R1312 Dysphagia, oropharyngeal phase: Secondary | ICD-10-CM | POA: Diagnosis not present

## 2017-08-25 DIAGNOSIS — D509 Iron deficiency anemia, unspecified: Secondary | ICD-10-CM | POA: Diagnosis not present

## 2017-08-25 DIAGNOSIS — I2510 Atherosclerotic heart disease of native coronary artery without angina pectoris: Secondary | ICD-10-CM | POA: Diagnosis not present

## 2017-08-25 DIAGNOSIS — D329 Benign neoplasm of meninges, unspecified: Secondary | ICD-10-CM | POA: Diagnosis not present

## 2017-08-25 DIAGNOSIS — J9612 Chronic respiratory failure with hypercapnia: Secondary | ICD-10-CM | POA: Diagnosis not present

## 2017-08-25 DIAGNOSIS — I700 Atherosclerosis of aorta: Secondary | ICD-10-CM | POA: Diagnosis not present

## 2017-08-25 DIAGNOSIS — E43 Unspecified severe protein-calorie malnutrition: Secondary | ICD-10-CM | POA: Diagnosis not present

## 2017-08-25 DIAGNOSIS — J9611 Chronic respiratory failure with hypoxia: Secondary | ICD-10-CM | POA: Diagnosis not present

## 2017-08-25 DIAGNOSIS — J180 Bronchopneumonia, unspecified organism: Secondary | ICD-10-CM | POA: Diagnosis not present

## 2017-08-25 DIAGNOSIS — M5137 Other intervertebral disc degeneration, lumbosacral region: Secondary | ICD-10-CM | POA: Diagnosis not present

## 2017-08-25 DIAGNOSIS — I2609 Other pulmonary embolism with acute cor pulmonale: Secondary | ICD-10-CM | POA: Diagnosis not present

## 2017-08-25 DIAGNOSIS — I10 Essential (primary) hypertension: Secondary | ICD-10-CM | POA: Diagnosis not present

## 2017-08-25 DIAGNOSIS — J439 Emphysema, unspecified: Secondary | ICD-10-CM | POA: Diagnosis not present

## 2017-08-26 DIAGNOSIS — J449 Chronic obstructive pulmonary disease, unspecified: Secondary | ICD-10-CM | POA: Diagnosis not present

## 2017-08-28 ENCOUNTER — Telehealth: Payer: Self-pay

## 2017-08-28 NOTE — Telephone Encounter (Signed)
ce faxed Certification and Care Plan to be signed. Fax has been down. Need completed for patietn. Contact person Michela Pitcher [phone].

## 2017-08-28 NOTE — Telephone Encounter (Signed)
Called Holly Page and let her know that there may be a delay

## 2017-08-30 NOTE — Progress Notes (Addendum)
[place] at [facility] [address], [place], [place] [address] [phone]  Date:  08/31/2017   Name:  Holly Page   DOB:  Nov 09, 1935   MRN:  [id number]  PCP:  Darreld Mclean, MD    Chief Complaint: Abdominal Pain   History of Present Illness:  Holly Page is a 81 y.o. very pleasant female patient who presents with the following:  Pt with history of end stage COPD, cachexia.  Here today with concern of blood in her urine since this past Thursday.  Today is Monday. She will notice the hematuria more so in the morning, better as the day goes on.  However her urine always appears to have blood in it throughout the day  No pain with urination She notes that she has to urinate frequently.   She notes that she has back pain and some pain into her left flank No fever today  She has a nurse coming out to help her at home.  She tells me about another home care agency who also wanted to take over her health insurance?   Explained that I am not familiar with this type of plan  Pt states that she saw urology perhaps in January or February of this year- I do not have notes about this. She does have history of kidney stones.  It sounds like she had lithotripsy, but they were not able to remove all the pieces of stone. She is now seeing a different urologist with [facility], Dr. Lupita Leash. She saw him last about 6 weeks ago- per his note  Regarding her urinary frequency/nocturia, it seems that these symptoms are controlled with anticholinergics but she does not like the oxytrol patch, she had side effects with oxybutynin, and Vesicare was too expensive. I have provided samples of Toviaz 4mg  x2 weeks. I think eventually she will have to choose between price (Vesicare) and side effects (oxybutynin).  She is requesting narcotic pain medications today for nonspecific abdominal pain, which does not seem to be urological in etiology. She  seems upset that I will not prescribe for her and I think she is going to return to seeing Dr Estill Dooms for her urological care.  In the past 3 years she has had 7 CTs and 2 ultrasound as well as at least 2 cystoscopies (and retrograde pyelograms x1). I do not recommend any additional imaging. I have recommended that her stones be managed expectantly and that she simply tolerate some intermittent hematuria. I have nothing new to offer in addition to the thorough evaluation performed by Dr Estill Dooms already.  She is taking eliquis 2.5 BID right now-  This was started by Dr. Marin Olp  Patient Active Problem List   Diagnosis Date Noted  . Iron deficiency anemia due to chronic blood loss 01/23/2017  . 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 Pulmonary embolus ([place]) 12/30/2016  . Severe sepsis ([place]) 12/26/2016  . Influenza B 12/26/2016  . Acute urinary retention 12/26/2016  . Oropharyngeal dysphagia 12/26/2016  . Metabolic acidosis 33/82/5053  . Polyneuropathy 12/26/2016  . Arthritis   . Clotting disorder ([place])   . CAD (coronary artery disease)   . Fibroid   . Hypertension   . Kidney stone   . Protein-calorie malnutrition, severe 07/23/2016  . Shortness of breath 07/23/2016  . SOB (shortness of breath) 07/21/2016  . Acute on chronic respiratory failure with hypoxia and hypercapnia ([place]) 07/21/2016  . Chronic respiratory failure ([place])  04/10/2016  . COPD with exacerbation ([place]) 03/19/2016  . RBC microcytosis 03/19/2016  . COPD with acute exacerbation ([place]) 03/19/2016  . Oxygen dependent 12/31/2015  . Anxiety 11/08/2015  . Hyperthyroidism 09/07/2015  . Malnutrition of moderate degree 08/31/2015  . Abnormal LFTs 03/19/2015  . Back pain 03/19/2015  . HTN (hypertension) 03/01/2015  . Anemia 12/19/2014  . Calculus of kidney 08/30/2014  . Hematuria 08/11/2014  . History of DVT (deep vein thrombosis)   . Neuralgia neuritis, sciatic nerve 05/22/2014  . 1st degree AV block 04/24/2014  . History of colon  polyps 04/24/2014  . Adaptive colitis 04/24/2014  . APC (atrial premature contractions) 04/24/2014  . Benign neoplasm of meninges ([place]) 02/10/2014  . Barrett esophagus 02/03/2014  . Spinal stenosis 12/15/2013  . Allergic rhinitis 11/21/2013  . COPD with emphysema Gold D   . GERD (gastroesophageal reflux disease)   . Lactose intolerance   . DDD (degenerative disc disease), lumbosacral 06/11/2010  . Diffuse cerebrovascular disease 06/11/2010    Past Medical History:  Diagnosis Date  . Anemia   . Arthritis   . CAD (coronary artery disease)   . Clotting disorder ([place])   . COPD (chronic obstructive pulmonary disease) ([place])   . DVT (deep venous thrombosis) ([place]) 1980s, recurrent 2015   Right DVT 2015  on xarelto  . Fibroid   . GERD (gastroesophageal reflux disease)   . Hypertension   . Hypertension   . Iron deficiency anemia due to chronic blood loss 01/23/2017  . 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 Kidney stone   . Lactose intolerance   . Meningioma ([place])    followed by Dr Christella Noa  . Pneumonia   . Spinal stenosis     Past Surgical History:  Procedure Laterality Date  . CHOLECYSTECTOMY    . FOOT SURGERY    . INCISION AND DRAINAGE FOOT    . TONSILLECTOMY AND ADENOIDECTOMY    . TREATMENT FISTULA ANAL    . TUBAL LIGATION      Social History   Tobacco Use  . Smoking status: Former Smoker    Packs/day: 1.50    Years: 56.00    Pack years: 84.00    Types: Cigarettes    Last attempt to quit: 04/29/2012    Years since quitting: 5.3  . Smokeless tobacco: Never Used  . Tobacco comment: quit smoking 3 years ago  Substance Use Topics  . Alcohol use: No    Alcohol/week: 0.0 oz    Comment: quit drinking beer 2005  . Drug use: No    Family History  Problem Relation Age of Onset  . COPD Father        smoker deceased.   . Emphysema Father   . Lupus Sister   . Heart attack Son   . Heart disease Son   . Hypertension Son   . Hypertension Son     Allergies  Allergen  Reactions  . Aspirin Other (See Comments)    Other reaction(s): PALPITATIONS Other reaction(s): DIFFICULTY BREATHING Heart flutter  . Incruse Ellipta [Umeclidinium Bromide] Shortness Of Breath  . Tramadol Shortness Of Breath  . Hydrocodone-Acetaminophen Other (See Comments)  . Amlodipine Other (See Comments)    Other reaction(s): SWELLING  . Codeine Other (See Comments)    Hallucinations, can take Tylenol #3 now  . Doxycycline Other (See Comments)    Other reaction(s): NAUSEA,VOMITING  . Gabapentin Other (See Comments)    Other reaction(s): Other (See Comments) She could not swallow the large pill  .  Hydrocodone Other (See Comments)    hallucinations  . Losartan Other (See Comments)    unknown  . Propoxyphene Nausea Only  . Tiotropium Itching and Rash    Medication list has been reviewed and updated.  Current Outpatient Medications on File Prior to Visit  Medication Sig Dispense Refill  . acetaminophen-codeine (TYLENOL #4) 300-60 MG tablet Take 1 tablet by mouth every 6 (six) hours as needed for moderate pain. 30 tablet 1  . albuterol (PROVENTIL) (2.5 MG/3ML) 0.083% nebulizer solution USE 1 VIA EVERY 6 HOURS AS NEEDED FOR WHEEZING OR SHORTNESS OF BREATH 375 mL 0  . apixaban (ELIQUIS) 2.5 MG TABS tablet Take 1 tablet (2.5 mg total) by mouth 2 (two) times daily. 60 tablet 3  . cholecalciferol (VITAMIN D) 1000 units tablet Take 1,000 Units by mouth daily.    [name] ELIQUIS 2.5 MG TABS tablet TAKE 1 TABLET (2.5 MG TOTAL) BY MOUTH 2 (TWO) TIMES DAILY. 60 tablet 3  . fluticasone furoate-vilanterol (BREO ELLIPTA) 200-25 MCG/INH AEPB Inhale 1 puff into the lungs daily. 2 each 0  . methylPREDNISolone (MEDROL DOSEPAK) 4 MG TBPK tablet See admin instructions. follow package directions  0  . Multiple Vitamins-Minerals (MULTIVITAMIN & MINERAL PO) Take 1 tablet by mouth daily.    . Nutritional Supplements (ENSURE ENLIVE PO) Take 237 mLs by mouth 2 (two) times daily.    . OXYGEN Inhale 2 L into the  lungs daily as needed. To maintain o2 saturation of 90%    . potassium chloride (K-DUR) 10 MEQ tablet Take 1 tablet (10 mEq total) by mouth daily. 7 tablet 0  . predniSONE (DELTASONE) 5 MG tablet Take 1 tablet (5 mg total) by mouth daily with breakfast. 30 tablet 1  . PROAIR HFA 108 (90 Base) MCG/ACT inhaler     . ranitidine (ZANTAC) 300 MG capsule Take 1 capsule (300 mg total) by mouth every evening. 30 capsule 11  . solifenacin (VESICARE) 5 MG tablet Take 5 mg by mouth.    . verapamil (CALAN-SR) 240 MG CR tablet Take 1 tablet (240 mg total) by mouth at bedtime. 90 tablet 3   No current facility-administered medications on file prior to visit.     Review of Systems:  As per HPI- otherwise negative.   Physical Examination: Vitals:   08/31/17 1241  BP: 122/62  Pulse: 62  Temp: 98.3 F (36.8 C)  SpO2: 98%   There were no vitals filed for this visit. There is no height or weight on file to calculate BMI. Ideal Body Weight:    GEN: WDWN, NAD, Non-toxic, A & O x 3, appears her usual self, wearing oxygen HEENT: Atraumatic, Normocephalic. Neck supple. No masses, No LAD. Ears and Nose: No external deformity. CV: RRR, No M/G/R. No JVD. No thrill. No extra heart sounds. PULM: CTA B, no wheezes, crackles, rhonchi. No retractions. No resp. distress. No accessory muscle use. ABD: S, NT, ND, +BS. No rebound. No HSM.  Difficult exam of her belly due to pts debility.  She endorses pain in her left flank to exam EXTR: No c/c/e NEURO sitting in [facility] PSYCH: Normally interactive. Conversant. Not depressed or anxious appearing.  Calm demeanor.   Ct Renal Stone Study  Result Date: 08/31/2017 CLINICAL DATA:  Left flank and left abdominal pain and hematuria for few days. EXAM: CT ABDOMEN AND PELVIS WITHOUT CONTRAST TECHNIQUE: Multidetector CT imaging of the abdomen and pelvis was performed following the standard protocol without IV contrast. COMPARISON:  CT abdomen and pelvis  12/21/2016. CT abdomen  and pelvis 03/28/2016. MRCP 05/23/2016 FINDINGS: Lower chest: Lung bases demonstrate severe emphysematous disease. No pleural or pericardial effusion. Hepatobiliary: The patient is status post cholecystectomy. No focal liver lesion is identified. Calcification in the right hepatic lobe is compatible with old granulomatous disease. There is intrahepatic biliary ductal dilatation which appears somewhat worse than on the prior CT. The common bile duct is dilated up to 1.6 cm on today's study compared to 1 cm on the most recent CT scan. Pancreas: The pancreatic duct measures 0.6 cm in diameter compared to 0.4 cm on the most recent CT. No focal pancreatic lesion is seen. Spleen: Normal in size without focal abnormality. Adrenals/Urinary Tract: Multiple calcifications in the right kidney compatible with nonobstructing stones are in unchanged. A few nonobstructing stones are also identified in the left kidney. There is no hydronephrosis on the right or left and no ureteral or urinary bladder stones are seen. Multiple calcifications in the pelvis consistent with phleboliths are identified and unchanged. Stomach/Bowel: The stomach and small and large bowel appear normal. Vascular/Lymphatic: Aortic atherosclerosis. No enlarged abdominal or pelvic lymph nodes. Reproductive: Calcified uterine fibroids are seen.  No adnexal mass. Other: No fluid collection or lymphadenopathy. Musculoskeletal: Convex left lumbar scoliosis and advanced multilevel degenerative disc disease are seen. IMPRESSION: The patient has bilateral nonobstructing renal stones. No hydronephrosis is identified. Chronic intra and extrahepatic biliary ductal dilatation and dilatation of the pancreatic duct worsened since the prior examinations. Cause for this finding is not identified. If clinically indicated, ERCP or MRCP could be used for further evaluation. Atherosclerosis. Severe emphysema. Electronically Signed   By: Inge Rise M.D.   On: 08/31/2017  14:15    Assessment and Plan: Gross hematuria - Plan: Urine Culture, POCT urinalysis dipstick, CBC, Comprehensive metabolic panel, CT RENAL STONE STUDY, cephALEXin (KEFLEX) 500 MG capsule, CANCELED: CT Abdomen Pelvis Wo Contrast  History of kidney stones - Plan: Comprehensive metabolic panel, CT RENAL STONE STUDY, CANCELED: CT Abdomen Pelvis Wo Contrast  Left flank pain - Plan: Comprehensive metabolic panel, CT RENAL STONE STUDY, CANCELED: CT Abdomen Pelvis Wo Contrast  Here today with complaint of recurrent gross hematuria and pain.  Her exam and history of challenging due to baseline debility and severe COPD Decided to do a non- contrast CT for her today, as above Will also obtain Ucx and labs  Start on keflex for possible UTI On further review of chart it appear that the biliary duct dilitation mentioned in her CT was evaluated in the past by GI, and further evaluation was decided against due to her co-morbidities and anesthesia risk  See GI note from 8/17, Dr. Dorrene German with [facility].  Will call and discuss with him in further detail when I am able Will plan further follow- up pending labs. Meds ordered this encounter  Medications  . cephALEXin (KEFLEX) 500 MG capsule    Sig: Take 1 capsule (500 mg total) by mouth 2 (two) times daily.    Dispense:  14 capsule    Refill:  0      Signed Lamar Blinks, MD 12/4- Heard back from Dr. Marin Olp- yes, plan to continue eliquis.  Called pt on 12/4 to discuss.  She requested a refill of her tylenol 4 which I did for her Also discussed findings regarding her bile ducts, pancreatic ducts.  She is actually seeing her GI doctor next week. Will call his office and fax over a report   Received her urine culture 12/6 It is  negative Called pt and let her know- she can stop the abx  Results for orders placed or performed in visit on 08/31/17  Urine Culture  Result Value Ref Range   MICRO NUMBER: [id number]    SPECIMEN QUALITY: ADEQUATE    Sample  Source URINE    STATUS: FINAL    Result: No Growth   CBC  Result Value Ref Range   WBC 4.6 4.0 - 10.5 K/uL   RBC 4.99 3.87 - 5.11 Mil/uL   Platelets 383.0 150.0 - 400.0 K/uL   Hemoglobin 11.4 (L) 12.0 - 15.0 g/dL   HCT 38.7 36.0 - 46.0 %   MCV 77.6 (L) 78.0 - 100.0 fl   MCHC 29.5 Repeated and verified X2. (L) 30.0 - 36.0 g/dL   RDW 15.6 (H) 11.5 - 15.5 %  Comprehensive metabolic panel  Result Value Ref Range   Sodium 148 (H) 135 - 145 mEq/L   Potassium 5.0 3.5 - 5.1 mEq/L   Chloride 95 (L) 96 - 112 mEq/L   CO2 45 (H) 19 - 32 mEq/L   Glucose, Bld 99 70 - 99 mg/dL   BUN 8 6 - 23 mg/dL   Creatinine, Ser 0.45 0.40 - 1.20 mg/dL   Total Bilirubin 0.4 0.2 - 1.2 mg/dL   Alkaline Phosphatase 63 39 - 117 U/L   AST 43 (H) 0 - 37 U/L   ALT 16 0 - 35 U/L   Total Protein 7.5 6.0 - 8.3 g/dL   Albumin 4.3 3.5 - 5.2 g/dL   Calcium 10.8 (H) 8.4 - 10.5 mg/dL   GFR 172.75 >60.00 mL/min  POCT urinalysis dipstick  Result Value Ref Range   Color, UA brown (A) yellow   Clarity, UA clear clear   Glucose, UA negative negative mg/dL   Bilirubin, UA negative negative   Ketones, POC UA negative negative mg/dL   Spec Grav, UA 1.015 1.010 - 1.025   Blood, UA large (A) negative   pH, UA 6.0 5.0 - 8.0   Protein Ur, POC =30 (A) negative mg/dL   Urobilinogen, UA 0.2 0.2 or 1.0 E.U./dL   Nitrite, UA Negative Negative   Leukocytes, UA Negative Negative

## 2017-08-31 ENCOUNTER — Other Ambulatory Visit: Payer: Self-pay | Admitting: *Deleted

## 2017-08-31 ENCOUNTER — Ambulatory Visit (HOSPITAL_BASED_OUTPATIENT_CLINIC_OR_DEPARTMENT_OTHER)
Admission: RE | Admit: 2017-08-31 | Discharge: 2017-08-31 | Disposition: A | Discharge status: Home / Self Care | Payer: Medicare Other | Source: Ambulatory Visit | Attending: Family Medicine | Admitting: Family Medicine

## 2017-08-31 ENCOUNTER — Ambulatory Visit (INDEPENDENT_AMBULATORY_CARE_PROVIDER_SITE_OTHER): Admitting: Family Medicine

## 2017-08-31 VITALS — BP 122/62 | HR 62 | Temp 98.3°F

## 2017-08-31 DIAGNOSIS — I700 Atherosclerosis of aorta: Secondary | ICD-10-CM | POA: Diagnosis not present

## 2017-08-31 DIAGNOSIS — K838 Other specified diseases of biliary tract: Secondary | ICD-10-CM | POA: Diagnosis not present

## 2017-08-31 DIAGNOSIS — M25561 Pain in right knee: Secondary | ICD-10-CM | POA: Diagnosis not present

## 2017-08-31 DIAGNOSIS — R109 Unspecified abdominal pain: Secondary | ICD-10-CM | POA: Insufficient documentation

## 2017-08-31 DIAGNOSIS — R310 Gross hematuria: Secondary | ICD-10-CM

## 2017-08-31 DIAGNOSIS — I2782 Chronic pulmonary embolism: Secondary | ICD-10-CM

## 2017-08-31 DIAGNOSIS — N200 Calculus of kidney: Secondary | ICD-10-CM | POA: Diagnosis not present

## 2017-08-31 DIAGNOSIS — K8689 Other specified diseases of pancreas: Secondary | ICD-10-CM | POA: Insufficient documentation

## 2017-08-31 DIAGNOSIS — Z87442 Personal history of urinary calculi: Secondary | ICD-10-CM | POA: Diagnosis not present

## 2017-08-31 DIAGNOSIS — J439 Emphysema, unspecified: Secondary | ICD-10-CM | POA: Insufficient documentation

## 2017-08-31 DIAGNOSIS — Z86718 Personal history of other venous thrombosis and embolism: Secondary | ICD-10-CM

## 2017-08-31 DIAGNOSIS — I2609 Other pulmonary embolism with acute cor pulmonale: Secondary | ICD-10-CM

## 2017-08-31 LAB — CBC
HCT: 38.7 % (ref 36.0–46.0)
HEMOGLOBIN: 11.4 g/dL — AB (ref 12.0–15.0)
MCV: 77.6 fl — AB (ref 78.0–100.0)
Platelets: 383 10*3/uL (ref 150.0–400.0)
RBC: 4.99 Mil/uL (ref 3.87–5.11)
RDW: 15.6 % — ABNORMAL HIGH (ref 11.5–15.5)
WBC: 4.6 10*3/uL (ref 4.0–10.5)

## 2017-08-31 LAB — POCT URINALYSIS DIP (MANUAL ENTRY)
BILIRUBIN UA: NEGATIVE
BILIRUBIN UA: NEGATIVE mg/dL
Glucose, UA: NEGATIVE mg/dL
Leukocytes, UA: NEGATIVE
NITRITE UA: NEGATIVE
PH UA: 6 (ref 5.0–8.0)
Protein Ur, POC: 30 mg/dL — AB
Spec Grav, UA: 1.015 (ref 1.010–1.025)
Urobilinogen, UA: 0.2 E.U./dL

## 2017-08-31 MED ORDER — APIXABAN 2.5 MG PO TABS: 2.5000 mg | ORAL_TABLET | Freq: Two times a day (BID) | ORAL | 11 refills | Status: AC

## 2017-08-31 MED ORDER — CEPHALEXIN 500 MG PO CAPS: 500.0000 mg | ORAL_CAPSULE | Freq: Two times a day (BID) | ORAL | 0 refills | Status: AC

## 2017-08-31 NOTE — Patient Instructions (Addendum)
I will be in touch after I speak with Dr. Marin Olp, and receive your labs. For the time being please use the keflex twice a day for one week.  Let me know if any change or worsening of your symptoms

## 2017-09-01 ENCOUNTER — Telehealth: Payer: Self-pay | Admitting: *Deleted

## 2017-09-01 ENCOUNTER — Telehealth: Payer: Self-pay | Admitting: Family Medicine

## 2017-09-01 LAB — COMPREHENSIVE METABOLIC PANEL
ALBUMIN: 4.3 g/dL (ref 3.5–5.2)
ALK PHOS: 63 U/L (ref 39–117)
ALT: 16 U/L (ref 0–35)
AST: 43 U/L — ABNORMAL HIGH (ref 0–37)
BILIRUBIN TOTAL: 0.4 mg/dL (ref 0.2–1.2)
BUN: 8 mg/dL (ref 6–23)
CALCIUM: 10.8 mg/dL — AB (ref 8.4–10.5)
CO2: 45 mEq/L — ABNORMAL HIGH (ref 19–32)
CREATININE: 0.45 mg/dL (ref 0.40–1.20)
Chloride: 95 mEq/L — ABNORMAL LOW (ref 96–112)
GFR: 172.75 mL/min (ref >60.00)
GLUCOSE: 99 mg/dL (ref 70–99)
Potassium: 5 mEq/L (ref 3.5–5.1)
SODIUM: 148 meq/L — AB (ref 135–145)
TOTAL PROTEIN: 7.5 g/dL (ref 6.0–8.3)

## 2017-09-01 LAB — URINE CULTURE
MICRO NUMBER: 81355106
RESULT: NO GROWTH
SPECIMEN QUALITY: ADEQUATE

## 2017-09-01 MED ORDER — ACETAMINOPHEN-CODEINE #4 300-60 MG PO TABS: 1.0000 | ORAL_TABLET | Freq: Four times a day (QID) | ORAL | 1 refills | Status: AC | PRN

## 2017-09-01 NOTE — Telephone Encounter (Signed)
Copied from [place] [id number]. Topic: Quick Communication - See Telephone Encounter >> Sep 01, 2017  9:35 AM Burnis Medin, NT wrote: CRM for notification. See Telephone encounter for: Pt. Is calling because she wants to speak to the doctor about whether or not to take her apixaban (ELIQUIS) 2.5 MG TABS tablet. Pt. Would like a call back  09/01/17.

## 2017-09-01 NOTE — Telephone Encounter (Signed)
Patient was seen by her PCP yesterday for hematuria. She wants to know whether or not she should continue her Eliquis.  Patient has had hematuria chronically.  Spoke to [facility] NP and she wants patient to continue the Eliquis. In the past when it was held, patient developed new clots. She needs to continue the Eliquis as her hematuria is chronic, and her hgb is stable.  Patient is aware and will continue her Eliquis

## 2017-09-02 NOTE — Telephone Encounter (Signed)
Called her and went over this.  Yes, continue eliquis

## 2017-09-02 NOTE — Telephone Encounter (Signed)
Called [facility]- will fax her recent CT result to them, she is seeing Dr. Dorrene German next week for a recheck.  Melida and I had discussed findings regarding her ductal dilatation and possible further work-up.  However as in the past Shameria does not want to do anything that would require anesthesia which I agree is wise given her lung function JC

## 2017-09-03 ENCOUNTER — Encounter: Payer: Self-pay | Admitting: Family Medicine

## 2017-09-03 ENCOUNTER — Telehealth: Payer: Self-pay | Admitting: Family Medicine

## 2017-09-03 DIAGNOSIS — D649 Anemia, unspecified: Secondary | ICD-10-CM

## 2017-09-03 NOTE — Telephone Encounter (Signed)
Copied from [place]. Topic: Quick Communication - See Telephone Encounter >> Sep 03, 2017  2:19 PM Oneta Rack wrote: CRM for notification. See Telephone encounter for:   09/03/17.  Caller name: Carletta  Relation to pt: Dr[facility]  Call back number: [phone] fax # [phone]    Reason for call: Patient contacted Dr. Verner Chol, MD office seeking to schedule re establishing appointment to address her enema and hemoglobin levels,  specialist requesting referral please send to   [facility] [address]   [place], [place] [address]  Phone: [phone]    Fax: [phone]

## 2017-09-04 ENCOUNTER — Telehealth: Payer: Self-pay | Admitting: Family Medicine

## 2017-09-04 ENCOUNTER — Telehealth: Payer: Self-pay

## 2017-09-04 NOTE — Addendum Note (Signed)
Addended by: Lamar Blinks C on: 09/04/2017 11:37 AM   Modules accepted: Orders

## 2017-09-04 NOTE — Telephone Encounter (Signed)
Received call from pt continuing to report hematuria and abd pain. Pt was evaluated by PCP on 12/3 for similar complaint. Pt is requesting pain medication. Advised pt to contact PCP or urologist for ongoing concerns related to hematuria. dph

## 2017-09-04 NOTE — Telephone Encounter (Signed)
Please advise 

## 2017-09-04 NOTE — Telephone Encounter (Signed)
Called pt- had to [facility].  I am sorry that I do not have a better solution for her, but she has history of SOB to tramadol and cannot tolerate hydrocodone so our options are limited. 1/2 of a tylenol 4 is equivalent to codeine in a #3.

## 2017-09-04 NOTE — Telephone Encounter (Signed)
Patient is calling to let provider know  That the Tylenol #4 is too much- it is making her too sleepy. She cuts it in half an it does not control the pain. She states the Tylenol #3 is not enough. Is there another alternative for her pain management? She also asked about her potassium level and wanted to make sure that that was normal. She may be reached at her home number- [phone]

## 2017-09-09 ENCOUNTER — Telehealth: Payer: Self-pay | Admitting: Family Medicine

## 2017-09-09 NOTE — Telephone Encounter (Signed)
Patient is calling to check on her Potasium results. In review- that result was normal and patient notified of that result. Told patient would let provider know she was notified of that result.

## 2017-09-09 NOTE — Telephone Encounter (Signed)
Please advise 

## 2017-09-12 DIAGNOSIS — E43 Unspecified severe protein-calorie malnutrition: Secondary | ICD-10-CM | POA: Diagnosis not present

## 2017-09-12 DIAGNOSIS — Z515 Encounter for palliative care: Secondary | ICD-10-CM | POA: Diagnosis not present

## 2017-09-12 DIAGNOSIS — Z79899 Other long term (current) drug therapy: Secondary | ICD-10-CM | POA: Diagnosis not present

## 2017-09-12 DIAGNOSIS — J9621 Acute and chronic respiratory failure with hypoxia: Secondary | ICD-10-CM | POA: Diagnosis not present

## 2017-09-12 DIAGNOSIS — R5381 Other malaise: Secondary | ICD-10-CM | POA: Diagnosis not present

## 2017-09-12 DIAGNOSIS — Z9981 Dependence on supplemental oxygen: Secondary | ICD-10-CM | POA: Diagnosis not present

## 2017-09-12 DIAGNOSIS — R000 Tachycardia, unspecified: Secondary | ICD-10-CM | POA: Diagnosis not present

## 2017-09-12 DIAGNOSIS — J440 Chronic obstructive pulmonary disease with acute lower respiratory infection: Secondary | ICD-10-CM | POA: Diagnosis not present

## 2017-09-12 DIAGNOSIS — J441 Chronic obstructive pulmonary disease with (acute) exacerbation: Secondary | ICD-10-CM | POA: Diagnosis not present

## 2017-09-12 DIAGNOSIS — J9611 Chronic respiratory failure with hypoxia: Secondary | ICD-10-CM | POA: Diagnosis not present

## 2017-09-12 DIAGNOSIS — R0602 Shortness of breath: Secondary | ICD-10-CM | POA: Diagnosis not present

## 2017-09-12 DIAGNOSIS — R64 Cachexia: Secondary | ICD-10-CM | POA: Diagnosis not present

## 2017-09-12 DIAGNOSIS — Z7952 Long term (current) use of systemic steroids: Secondary | ICD-10-CM | POA: Diagnosis not present

## 2017-09-12 DIAGNOSIS — J9622 Acute and chronic respiratory failure with hypercapnia: Secondary | ICD-10-CM | POA: Diagnosis not present

## 2017-09-12 DIAGNOSIS — J209 Acute bronchitis, unspecified: Secondary | ICD-10-CM | POA: Diagnosis not present

## 2017-09-12 DIAGNOSIS — R0789 Other chest pain: Secondary | ICD-10-CM | POA: Diagnosis not present

## 2017-09-12 DIAGNOSIS — K219 Gastro-esophageal reflux disease without esophagitis: Secondary | ICD-10-CM | POA: Diagnosis not present

## 2017-09-12 DIAGNOSIS — J449 Chronic obstructive pulmonary disease, unspecified: Secondary | ICD-10-CM | POA: Diagnosis not present

## 2017-09-12 DIAGNOSIS — Z8249 Family history of ischemic heart disease and other diseases of the circulatory system: Secondary | ICD-10-CM | POA: Diagnosis not present

## 2017-09-12 DIAGNOSIS — Z7901 Long term (current) use of anticoagulants: Secondary | ICD-10-CM | POA: Diagnosis not present

## 2017-09-12 DIAGNOSIS — Z825 Family history of asthma and other chronic lower respiratory diseases: Secondary | ICD-10-CM | POA: Diagnosis not present

## 2017-09-12 DIAGNOSIS — Z86718 Personal history of other venous thrombosis and embolism: Secondary | ICD-10-CM | POA: Diagnosis not present

## 2017-09-12 DIAGNOSIS — I10 Essential (primary) hypertension: Secondary | ICD-10-CM | POA: Diagnosis not present

## 2017-09-12 DIAGNOSIS — Z66 Do not resuscitate: Secondary | ICD-10-CM | POA: Diagnosis not present

## 2017-09-12 DIAGNOSIS — M79601 Pain in right arm: Secondary | ICD-10-CM | POA: Diagnosis not present

## 2017-09-12 DIAGNOSIS — R079 Chest pain, unspecified: Secondary | ICD-10-CM | POA: Diagnosis not present

## 2017-09-12 DIAGNOSIS — J9612 Chronic respiratory failure with hypercapnia: Secondary | ICD-10-CM | POA: Diagnosis not present

## 2017-09-12 DIAGNOSIS — Z87891 Personal history of nicotine dependence: Secondary | ICD-10-CM | POA: Diagnosis not present

## 2017-09-12 DIAGNOSIS — Z86010 Personal history of colonic polyps: Secondary | ICD-10-CM | POA: Diagnosis not present

## 2017-09-14 ENCOUNTER — Telehealth: Payer: Self-pay | Admitting: Family Medicine

## 2017-09-14 NOTE — Telephone Encounter (Signed)
Copied from [place] [id number]. Topic: Quick Communication - See Telephone Encounter >> Sep 14, 2017 10:20 AM Ether Griffins B wrote: CRM for notification. See Telephone encounter for:  Pt calling to let Dr. Lorelei Pont know she is in the hospital for low potassium.  09/14/17.

## 2017-09-14 NOTE — Telephone Encounter (Signed)
FYI

## 2017-09-23 ENCOUNTER — Telehealth: Payer: Self-pay | Admitting: *Deleted

## 2017-09-23 NOTE — Telephone Encounter (Signed)
Received Physician Orders from [facility]; forwarded to provider/SLS 12/26

## 2017-09-24 MED ORDER — METHYLPREDNISOLONE SODIUM SUCC 40 MG IJ SOLR
40.00 | INTRAMUSCULAR | Status: DC
Start: 2017-09-25 — End: 2017-09-24

## 2017-09-24 MED ORDER — ALUMINUM-MAGNESIUM-SIMETHICONE 200-200-20 MG/5ML PO SUSP
30.00 | ORAL | Status: DC
Start: ? — End: 2017-09-24

## 2017-09-24 MED ORDER — CHOLECALCIFEROL 25 MCG (1000 UT) PO TABS
1000.00 | ORAL_TABLET | ORAL | Status: DC
Start: 2017-09-25 — End: 2017-09-24

## 2017-09-24 MED ORDER — LORATADINE 10 MG PO TABS
10.00 | ORAL_TABLET | ORAL | Status: DC
Start: 2017-09-25 — End: 2017-09-24

## 2017-09-24 MED ORDER — LOPERAMIDE HCL 2 MG PO CAPS
2.00 | ORAL_CAPSULE | ORAL | Status: DC
Start: ? — End: 2017-09-24

## 2017-09-24 MED ORDER — ALBUTEROL SULFATE (2.5 MG/3ML) 0.083% IN NEBU
2.50 | INHALATION_SOLUTION | RESPIRATORY_TRACT | Status: DC
Start: 2017-09-24 — End: 2017-09-24

## 2017-09-24 MED ORDER — ALPRAZOLAM 0.25 MG PO TABS
0.25 | ORAL_TABLET | ORAL | Status: DC
Start: ? — End: 2017-09-24

## 2017-09-24 MED ORDER — APIXABAN 2.5 MG PO TABS
2.50 | ORAL_TABLET | ORAL | Status: DC
Start: 2017-09-24 — End: 2017-09-24

## 2017-09-24 MED ORDER — MULTI-VITAMINS PO TABS
1.00 | ORAL_TABLET | ORAL | Status: DC
Start: 2017-09-25 — End: 2017-09-24

## 2017-09-24 MED ORDER — FAMOTIDINE 20 MG PO TABS
20.00 | ORAL_TABLET | ORAL | Status: DC
Start: 2017-09-24 — End: 2017-09-24

## 2017-09-24 MED ORDER — ONDANSETRON HCL 4 MG/2ML IJ SOLN
4.00 | INTRAMUSCULAR | Status: DC
Start: ? — End: 2017-09-24

## 2017-09-24 MED ORDER — VERAPAMIL HCL ER 180 MG PO TBCR
180.00 | EXTENDED_RELEASE_TABLET | ORAL | Status: DC
Start: 2017-09-24 — End: 2017-09-24

## 2017-09-24 MED ORDER — ALBUTEROL SULFATE (2.5 MG/3ML) 0.083% IN NEBU
2.50 | INHALATION_SOLUTION | RESPIRATORY_TRACT | Status: DC
Start: ? — End: 2017-09-24

## 2017-09-24 MED ORDER — ACETAMINOPHEN-CODEINE #3 300-30 MG PO TABS
1.00 | ORAL_TABLET | ORAL | Status: DC
Start: ? — End: 2017-09-24

## 2017-09-24 MED ORDER — GENERIC EXTERNAL MEDICATION
325.00 | Status: DC
Start: 2017-09-25 — End: 2017-09-24

## 2017-09-24 MED ORDER — LIDOCAINE 4 % EX PTCH
1.00 | MEDICATED_PATCH | CUTANEOUS | Status: DC
Start: 2017-09-24 — End: 2017-09-24

## 2017-09-24 MED ORDER — BISACODYL 5 MG PO TBEC
10.00 | DELAYED_RELEASE_TABLET | ORAL | Status: DC
Start: ? — End: 2017-09-24

## 2017-09-24 MED ORDER — FLUTICASONE FUROATE-VILANTEROL 100-25 MCG/INH IN AEPB
1.00 | INHALATION_SPRAY | RESPIRATORY_TRACT | Status: DC
Start: 2017-09-25 — End: 2017-09-24

## 2017-09-24 MED ORDER — FLUTICASONE PROPIONATE 50 MCG/ACT NA SUSP
1.00 | NASAL | Status: DC
Start: 2017-09-25 — End: 2017-09-24

## 2017-09-24 MED ORDER — DIPHENHYDRAMINE HCL 25 MG PO CAPS
25.00 | ORAL_CAPSULE | ORAL | Status: DC
Start: ? — End: 2017-09-24

## 2017-09-24 MED ORDER — PANTOPRAZOLE SODIUM 40 MG PO TBEC
40.00 | DELAYED_RELEASE_TABLET | ORAL | Status: DC
Start: 2017-09-24 — End: 2017-09-24

## 2017-09-25 DIAGNOSIS — J449 Chronic obstructive pulmonary disease, unspecified: Secondary | ICD-10-CM | POA: Diagnosis not present

## 2017-09-29 DEATH — deceased

## 2017-10-14 ENCOUNTER — Telehealth: Payer: Self-pay | Admitting: Hematology & Oncology

## 2017-10-14 ENCOUNTER — Other Ambulatory Visit: Payer: Medicare Other

## 2017-10-14 ENCOUNTER — Ambulatory Visit: Payer: Medicare Other | Admitting: Family

## 2017-10-14 NOTE — Telephone Encounter (Signed)
16/2019  RE:  [facility] APPLICATION - [id number]  Hello [name],   I spoke with [employer] today and they advised, you have to spend $265.83 for 2019 of your out of pocket co-oay expenses, before they will approve/renew your application for this year.  Once you have done that, please bring me a copy of your receipts and I will fax for you. If you have any further questions, please feel free to contact me. You can also reach out to them by calling [phone].  Thanks  Otilio Carpen Patient Arboriculturist [facility] p: [phone] optn 2

## 2017-10-16 ENCOUNTER — Telehealth: Payer: Self-pay | Admitting: *Deleted

## 2017-10-16 NOTE — Telephone Encounter (Signed)
Received Physician Orders from [facility]; forwarded to provider/SLS 01/18  

## 2017-11-11 ENCOUNTER — Inpatient Hospital Stay: Attending: Hematology & Oncology

## 2017-11-11 ENCOUNTER — Inpatient Hospital Stay: Admitting: Family

## 2018-01-18 IMAGING — US US EXTREM LOW VENOUS*R*
1 series · 13 of 24 positions shown · non-contrast
Comparison: None.

CLINICAL DATA: Initial evaluation for right leg pain for 2-3
months.



[Series 1: us extrem low venous*right* · 0.05mm/px · 13 of 43 slices shown]
[im 1/43]
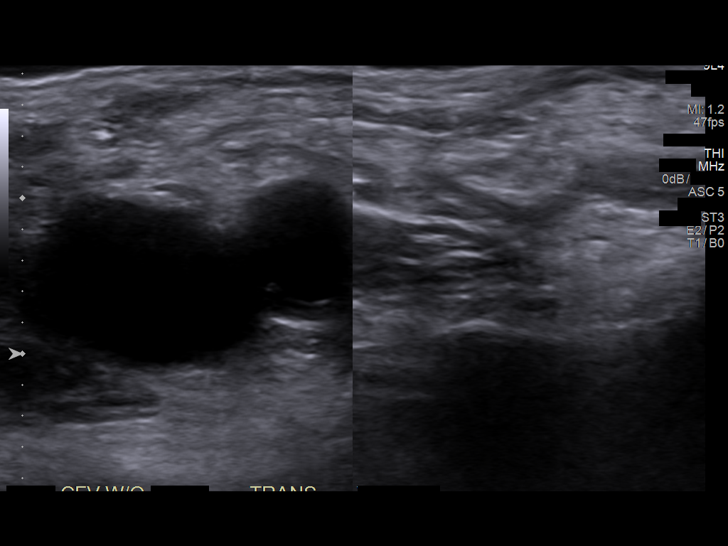
[im 4/43]
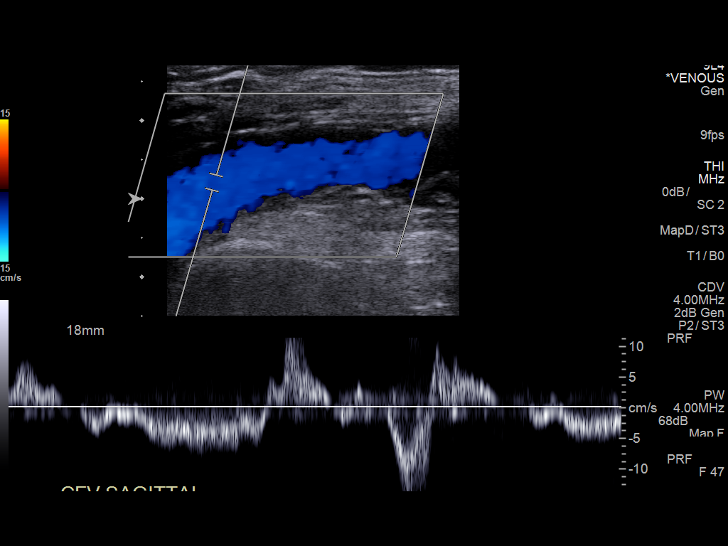
[im 8/43]
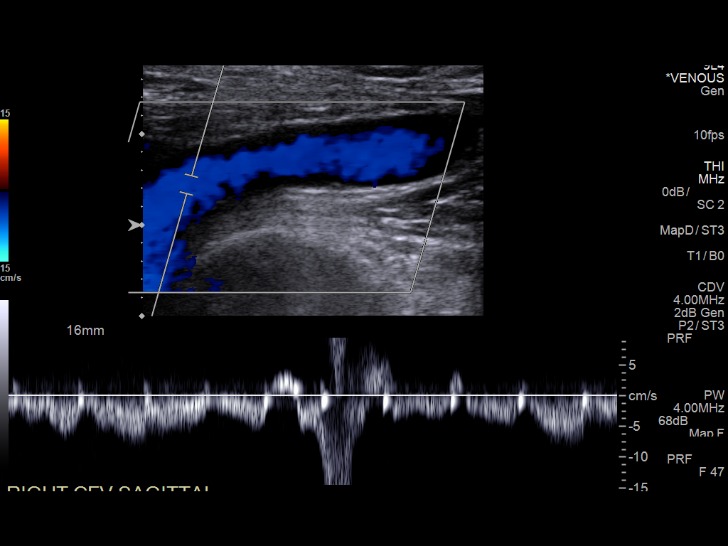
[im 11/43]
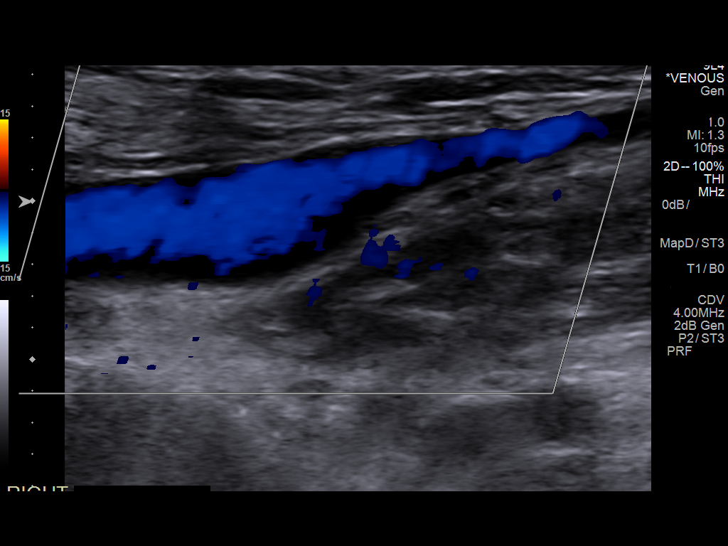
[im 15/43]
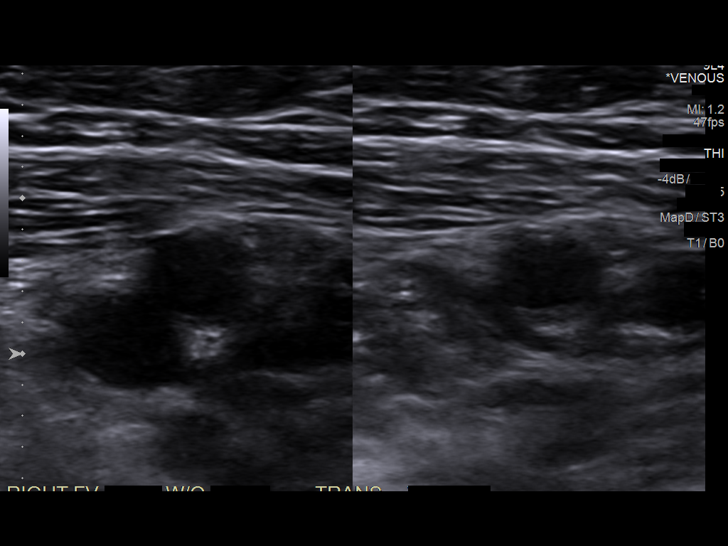
[im 19/43]
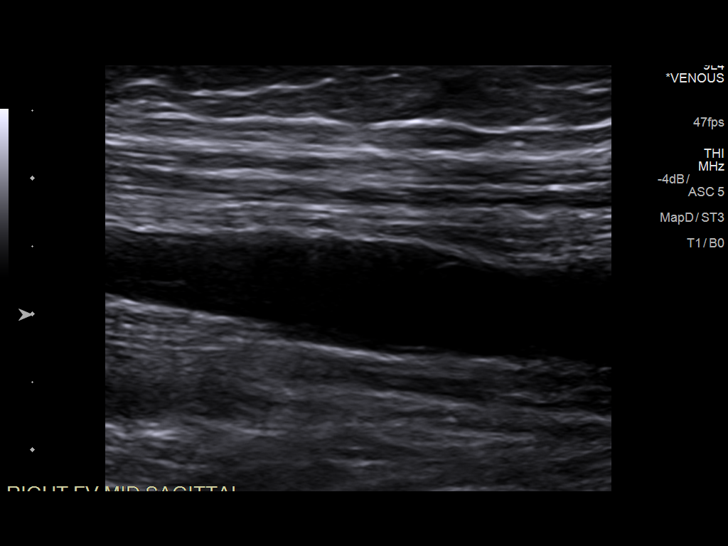
[im 22/43]
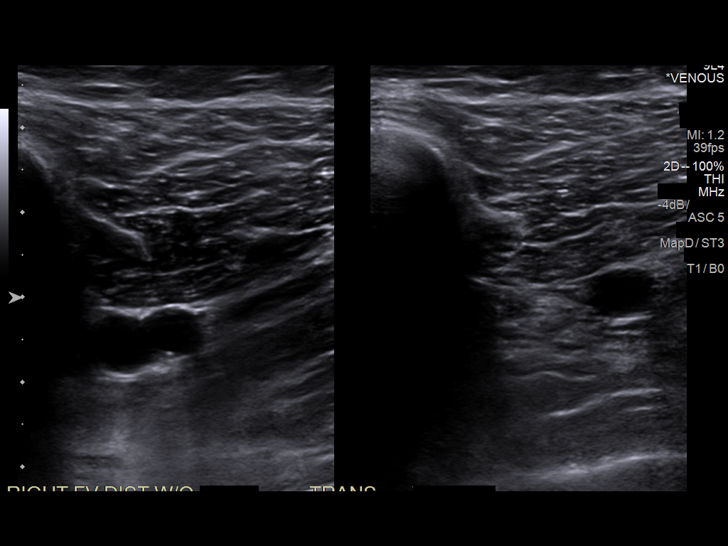
[im 24/43]
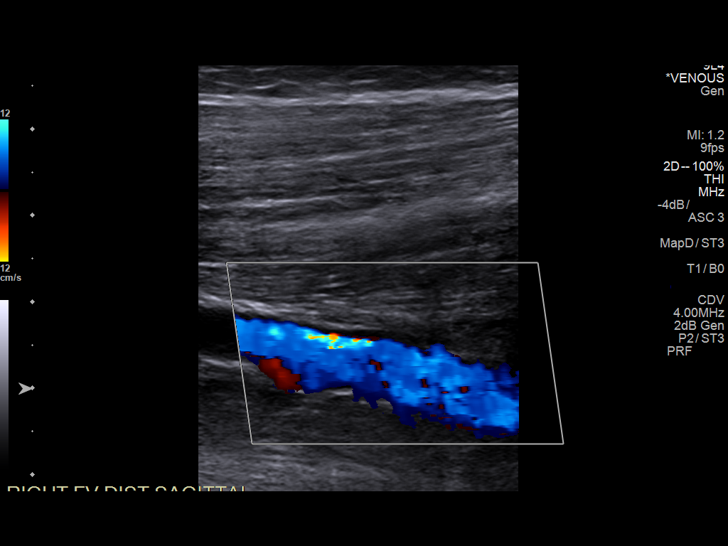
[im 28/43]
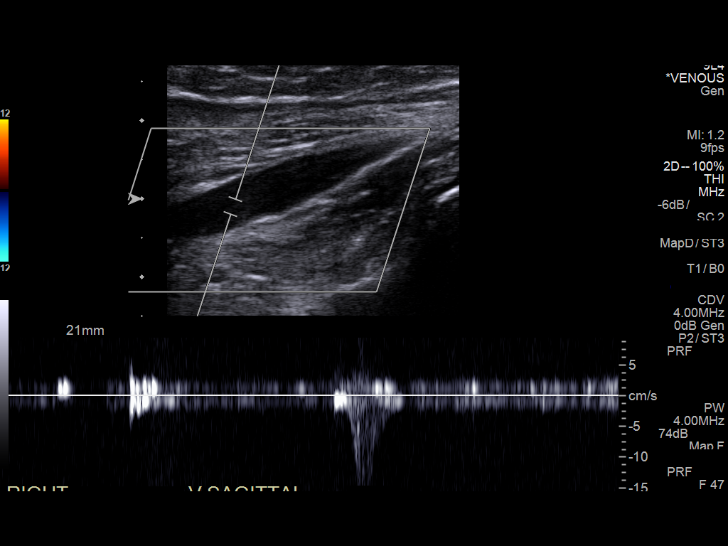
[im 32/43]
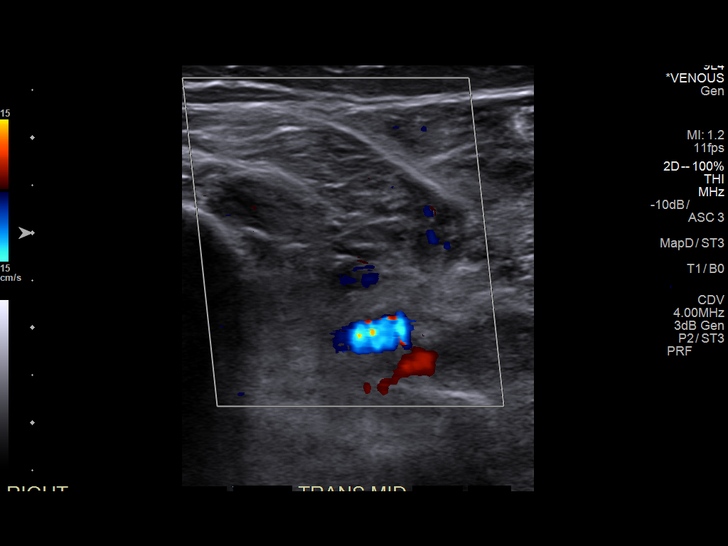
[im 35/43]
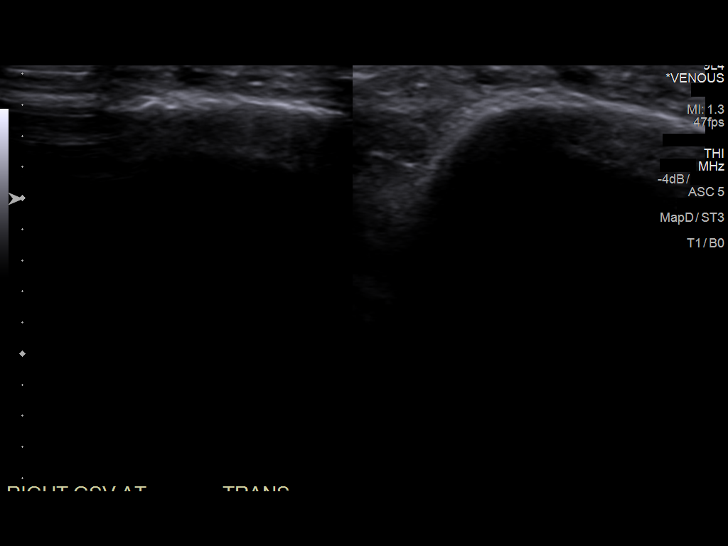
[im 39/43]
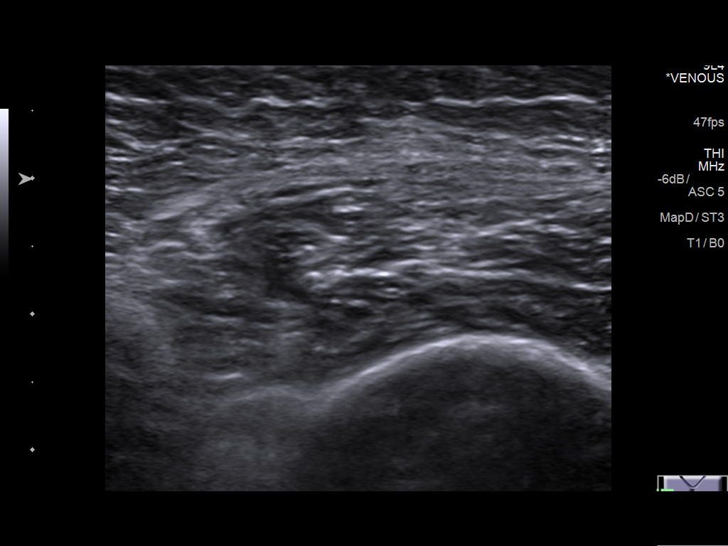
[im 43/43]
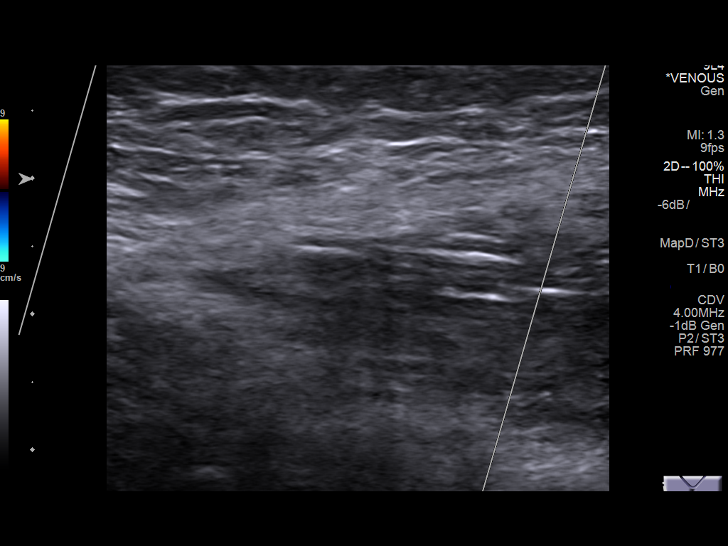

[13 of 24 positions shown; findings below may reference images not displayed]

FINDINGS: Contralateral Common Femoral Vein: Respiratory phasicity is normal
and symmetric with the symptomatic side. No evidence of thrombus.
Normal compressibility.

Common Femoral Vein: No evidence of thrombus. Normal
compressibility, respiratory phasicity and response to augmentation.

Saphenofemoral Junction: No evidence of thrombus. Normal
compressibility and flow on color Doppler imaging.

Profunda Femoral Vein: No evidence of thrombus. Normal
compressibility and flow on color Doppler imaging.

Femoral Vein: No evidence of thrombus. Normal compressibility,
respiratory phasicity and response to augmentation.

Popliteal Vein: No evidence of thrombus. Normal compressibility,
respiratory phasicity and response to augmentation.

Calf Veins: No evidence of thrombus. Normal compressibility and flow
on color Doppler imaging.

Superficial Great Saphenous Vein: No evidence of thrombus. Normal
compressibility and flow on color Doppler imaging.

Venous Reflux:  None.

Other Findings:  None.
IMPRESSION: No evidence of deep venous thrombosis within the right lower
extremity.
